# Patient Record
Sex: Male | Born: 1935 | Race: White | Hispanic: No | Marital: Married | Smoking: Never smoker
Health system: Southern US, Community
[De-identification: ages and names within clinical notes are randomized; demographics above are authoritative.]

## PROBLEM LIST (undated history)

## (undated) DIAGNOSIS — I471 Supraventricular tachycardia, unspecified: Secondary | ICD-10-CM

## (undated) DIAGNOSIS — M1990 Unspecified osteoarthritis, unspecified site: Secondary | ICD-10-CM

## (undated) DIAGNOSIS — G4733 Obstructive sleep apnea (adult) (pediatric): Secondary | ICD-10-CM

## (undated) DIAGNOSIS — I872 Venous insufficiency (chronic) (peripheral): Secondary | ICD-10-CM

## (undated) DIAGNOSIS — I2510 Atherosclerotic heart disease of native coronary artery without angina pectoris: Secondary | ICD-10-CM

## (undated) DIAGNOSIS — E119 Type 2 diabetes mellitus without complications: Secondary | ICD-10-CM

## (undated) DIAGNOSIS — R42 Dizziness and giddiness: Secondary | ICD-10-CM

## (undated) DIAGNOSIS — I5032 Chronic diastolic (congestive) heart failure: Secondary | ICD-10-CM

## (undated) DIAGNOSIS — E785 Hyperlipidemia, unspecified: Secondary | ICD-10-CM

## (undated) HISTORY — DX: Obstructive sleep apnea (adult) (pediatric): G47.33

## (undated) HISTORY — PX: FRACTURE SURGERY: SHX138

## (undated) HISTORY — DX: Venous insufficiency (chronic) (peripheral): I87.2

## (undated) HISTORY — PX: HERNIA REPAIR: SHX51

## (undated) HISTORY — PX: TONSILLECTOMY: SUR1361

## (undated) HISTORY — PX: COSMETIC SURGERY: SHX468

## (undated) HISTORY — PX: OTHER SURGICAL HISTORY: SHX169

## (undated) HISTORY — DX: Morbid (severe) obesity due to excess calories: E66.01

## (undated) HISTORY — DX: Supraventricular tachycardia: I47.1

## (undated) HISTORY — DX: Unspecified osteoarthritis, unspecified site: M19.90

## (undated) HISTORY — DX: Atherosclerotic heart disease of native coronary artery without angina pectoris: I25.10

## (undated) HISTORY — DX: Supraventricular tachycardia, unspecified: I47.10

## (undated) HISTORY — DX: Dizziness and giddiness: R42

## (undated) HISTORY — DX: Chronic diastolic (congestive) heart failure: I50.32

## (undated) HISTORY — DX: Type 2 diabetes mellitus without complications: E11.9

## (undated) HISTORY — DX: Hyperlipidemia, unspecified: E78.5

---

## 2004-11-22 ENCOUNTER — Ambulatory Visit: Payer: Self-pay | Admitting: Cardiology

## 2005-01-03 ENCOUNTER — Ambulatory Visit: Payer: Self-pay | Admitting: Cardiology

## 2005-01-03 ENCOUNTER — Encounter: Payer: Self-pay | Admitting: Cardiology

## 2005-02-14 ENCOUNTER — Ambulatory Visit: Payer: Self-pay | Admitting: Cardiology

## 2005-02-21 ENCOUNTER — Inpatient Hospital Stay (HOSPITAL_BASED_OUTPATIENT_CLINIC_OR_DEPARTMENT_OTHER): Admission: RE | Admit: 2005-02-21 | Discharge: 2005-02-21 | Payer: Self-pay | Admitting: Cardiology

## 2005-02-21 ENCOUNTER — Ambulatory Visit: Payer: Self-pay | Admitting: Cardiology

## 2005-03-14 ENCOUNTER — Ambulatory Visit: Payer: Self-pay | Admitting: Cardiology

## 2005-04-02 ENCOUNTER — Ambulatory Visit: Payer: Self-pay | Admitting: Internal Medicine

## 2005-06-20 ENCOUNTER — Ambulatory Visit: Payer: Self-pay | Admitting: Cardiology

## 2005-12-12 ENCOUNTER — Ambulatory Visit: Payer: Self-pay | Admitting: Cardiology

## 2006-03-27 ENCOUNTER — Ambulatory Visit: Payer: Self-pay | Admitting: Cardiology

## 2006-09-08 ENCOUNTER — Ambulatory Visit: Payer: Self-pay | Admitting: Cardiology

## 2007-03-11 ENCOUNTER — Ambulatory Visit: Payer: Self-pay | Admitting: Cardiology

## 2007-07-08 ENCOUNTER — Encounter: Payer: Self-pay | Admitting: Cardiology

## 2007-09-06 ENCOUNTER — Encounter: Payer: Self-pay | Admitting: Cardiology

## 2008-02-14 ENCOUNTER — Ambulatory Visit: Payer: Self-pay | Admitting: Cardiology

## 2008-05-05 ENCOUNTER — Encounter: Payer: Self-pay | Admitting: Cardiology

## 2008-10-04 ENCOUNTER — Ambulatory Visit: Payer: Self-pay | Admitting: Cardiology

## 2008-10-05 ENCOUNTER — Encounter: Payer: Self-pay | Admitting: Cardiology

## 2008-10-23 ENCOUNTER — Encounter: Payer: Self-pay | Admitting: Physician Assistant

## 2008-12-22 ENCOUNTER — Encounter: Payer: Self-pay | Admitting: Cardiology

## 2008-12-26 ENCOUNTER — Telehealth: Payer: Self-pay | Admitting: Cardiology

## 2008-12-26 DIAGNOSIS — E782 Mixed hyperlipidemia: Secondary | ICD-10-CM | POA: Insufficient documentation

## 2008-12-28 ENCOUNTER — Encounter: Payer: Self-pay | Admitting: Cardiology

## 2009-01-02 ENCOUNTER — Encounter (INDEPENDENT_AMBULATORY_CARE_PROVIDER_SITE_OTHER): Payer: Self-pay | Admitting: *Deleted

## 2009-01-19 ENCOUNTER — Encounter: Payer: Self-pay | Admitting: Cardiology

## 2009-02-03 DIAGNOSIS — I471 Supraventricular tachycardia: Secondary | ICD-10-CM

## 2009-02-03 DIAGNOSIS — I2510 Atherosclerotic heart disease of native coronary artery without angina pectoris: Secondary | ICD-10-CM | POA: Insufficient documentation

## 2009-04-06 ENCOUNTER — Encounter: Payer: Self-pay | Admitting: Cardiology

## 2009-05-30 ENCOUNTER — Ambulatory Visit: Payer: Self-pay | Admitting: Cardiology

## 2009-05-30 DIAGNOSIS — I10 Essential (primary) hypertension: Secondary | ICD-10-CM

## 2010-05-28 NOTE — Assessment & Plan Note (Signed)
Summary: 6 MO FU PER DEC REMINDER-SRS   Visit Type:  Follow-up Primary Provider:  Dr. Dwana Melena   History of Present Illness: 77 year old male presents for a followup visit. He denies any significant angina or palpitations. He is following with Dr. Margo Aye at this point.  Recent labs from December 10 revealed a BUN 18, creatinine 1.1, LDL 67, HDL 41, total cholesterol 540, triglycerides 101, ALT 19, AST 27, potassium 4.0, TSH 1.7, hemoglobin A1c 6.3.  Patient remains sedentary with no regular exercise regimen. He is morbidly obese, and we have discussed weight loss measures and diet many times. He has had a lot of difficulty losing any weight.  Home blood pressure checks showed systolics ranging from the 140s to the 170s.  He has NYHA class III dyspnea on exertion which is chronic.  Preventive Screening-Counseling & Management  Alcohol-Tobacco     Smoking Status: never  Current Medications (verified): 1)  Simvastatin 40 Mg Tabs (Simvastatin) .... Take One Tablet By Mouth Daily At Bedtime 2)  Diovan 320 Mg Tabs (Valsartan) .... Take One Tablet By Mouth Daily 3)  Aspir-Low 81 Mg Tbec (Aspirin) .... Take 1 Tablet By Mouth Once A Day 4)  Lasix 40 Mg Tabs (Furosemide) .... Take 1 Tablet By Mouth Once A Day 5)  Meloxicam 7.5 Mg Tabs (Meloxicam) .... Take 1 Tablet By Mouth Once A Day 6)  Doxazosin Mesylate 8 Mg Tabs (Doxazosin Mesylate) .... Take 1&1/2 Tablet By Mouth Once A Day 7)  Klor-Con 10 10 Meq Cr-Tabs (Potassium Chloride) .... Take 1 Tablet By Mouth Two Times A Day 8)  Centrum Silver  Tabs (Multiple Vitamins-Minerals) .... Take 1 Tablet By Mouth Once A Day 9)  Verelan 240 Mg Xr24h-Cap (Verapamil Hcl) .... Take 1 Tablet By Mouth Once A Day 10)  Glipizide 2.5 Mg Xr24h-Tab (Glipizide) .... Take 1 Tablet By Mouth Once A Day 11)  Zyrtec Allergy 10 Mg Caps (Cetirizine Hcl) .... Take 1 Tablet By Mouth Once A Day 12)  Clonidine Hcl 0.2 Mg Tabs (Clonidine Hcl) .... Take 1 Tablet By Mouth Once  A Day 13)  Flonase 50 Mcg/act Susp (Fluticasone Propionate) .... Two Sprays Per Nostril Daily 14)  Coricidin Nighttime Cold-Cough 12.5-325 Mg/41ml Liqd (Diphenhydramine-Apap) .... Take 1-2 Tablet By Mouth Once A Day 15)  Zantac 75 75 Mg Tabs (Ranitidine Hcl) .... Take 1 Tablet By Mouth Once A Day 16)  Xalatan 0.005 % Soln (Latanoprost) .... One Drop To Eye Every Pm 17)  Lidoderm 5 % Ptch (Lidocaine) .... Apply Patch Every 12 Hours  Allergies (verified): 1)  ! Sulfa 2)  ! Pcn 3)  ! Feldene  Comments:  Nurse/Medical Assistant: The patient's medications and allergies were reviewed with the patient and were updated in the Medication and Allergy Lists. List reveiwed.  Past History:  Past Medical History: Last updated: 05/29/2009 PSVT - AVNRT CAD - nonobstructive, LVEF 65% Hyperlipidemia Morbid obesity Arthritis Diabetes Type 2 Vertigo Venous insufficiency OSA - refuses CPAP  Social History: Last updated: 05/29/2009 Retired  Married  Tobacco Use - No Alcohol Use - no  Review of Systems       The patient complains of dyspnea on exertion and peripheral edema.  The patient denies anorexia, fever, weight loss, chest pain, syncope, prolonged cough, headaches, hemoptysis, melena, and hematochezia.         Otherwise reviewed and negative.  Vital Signs:  Patient profile:   77 year old male Height:      72 inches Weight:  384 pounds BMI:     52.27 O2 Sat:      94 % Pulse rate:   79 / minute BP sitting:   143 / 72  (left arm) Cuff size:   large  Vitals Entered By: Carlye Grippe (May 30, 2009 2:18 PM)  Nutrition Counseling: Patient's BMI is greater than 25 and therefore counseled on weight management options.   Physical Exam  Additional Exam:  Morbidly obese male in no acute distress. HEENT: Chronic left eye deviation, oropharynx with poor dentition. Neck: Supple, without carotid bruits or elevated jugular venous pressure. Lungs: Clear, diminished overall,  nonlabored. Cardiac: Regular rate and rhythm, no S3. Abdomen: Morbidly obese, bowel sounds present, nontender. Extremities: Chronic edema and venous stasis, compression hose in place. Musculoskeletal: No kyphosis. Skin: Warm and dry. Neuropsychiatric: Alert and oriented x3, affect appropriate.   Impression & Recommendations:  Problem # 1:  CAD, NATIVE VESSEL (ICD-414.01)  Stable without angina in the setting of previously documented nonobstructive disease. I once again discussed diet, weight loss, and a basic walking regimen with Mr. Mcclane. His medications were reviewed. Six month follow up will be arranged.  The following medications were removed from the medication list:    Lisinopril 20 Mg Tabs (Lisinopril) .Marland Kitchen... Take 1 tablet by mouth two times a day His updated medication list for this problem includes:    Aspir-low 81 Mg Tbec (Aspirin) .Marland Kitchen... Take 1 tablet by mouth once a day    Verelan 240 Mg Xr24h-cap (Verapamil hcl) .Marland Kitchen... Take 1 tablet by mouth once a day  Problem # 2:  SVT/ PSVT/ PAT (ICD-427.0)  No progressive palpitations. He continues on calcium channel blocker therapy.  The following medications were removed from the medication list:    Lisinopril 20 Mg Tabs (Lisinopril) .Marland Kitchen... Take 1 tablet by mouth two times a day His updated medication list for this problem includes:    Aspir-low 81 Mg Tbec (Aspirin) .Marland Kitchen... Take 1 tablet by mouth once a day    Verelan 240 Mg Xr24h-cap (Verapamil hcl) .Marland Kitchen... Take 1 tablet by mouth once a day  Problem # 3:  MORBID OBESITY (ICD-278.01)  This is a functionally limiting problem for Mr. Albright. He has had difficulty achieving any significant weight loss.  Problem # 4:  ESSENTIAL HYPERTENSION, BENIGN (ICD-401.1)  Patient to continue present regimen. Clonidine could be further advanced.  The following medications were removed from the medication list:    Lisinopril 20 Mg Tabs (Lisinopril) .Marland Kitchen... Take 1 tablet by mouth two times a day His  updated medication list for this problem includes:    Diovan 320 Mg Tabs (Valsartan) .Marland Kitchen... Take one tablet by mouth daily    Aspir-low 81 Mg Tbec (Aspirin) .Marland Kitchen... Take 1 tablet by mouth once a day    Lasix 40 Mg Tabs (Furosemide) .Marland Kitchen... Take 1 tablet by mouth once a day    Doxazosin Mesylate 8 Mg Tabs (Doxazosin mesylate) .Marland Kitchen... Take 1&1/2 tablet by mouth once a day    Verelan 240 Mg Xr24h-cap (Verapamil hcl) .Marland Kitchen... Take 1 tablet by mouth once a day    Clonidine Hcl 0.2 Mg Tabs (Clonidine hcl) .Marland Kitchen... Take 1 tablet by mouth once a day  Problem # 5:  OTHER AND UNSPECIFIED HYPERLIPIDEMIA (ICD-272.4)  LDL at goal on simvastatin.  His updated medication list for this problem includes:    Simvastatin 40 Mg Tabs (Simvastatin) .Marland Kitchen... Take one tablet by mouth daily at bedtime  Patient Instructions: 1)  Your physician wants you to  follow-up in: 6 months. You will receive a reminder letter in the mail one-two months in advance. If you don't receive a letter, please call our office to schedule the follow-up appointment. 2)  Your physician recommends that you continue on your current medications as directed. Please refer to the Current Medication list given to you today.

## 2010-06-28 ENCOUNTER — Ambulatory Visit: Payer: Self-pay | Admitting: Cardiology

## 2010-07-26 ENCOUNTER — Ambulatory Visit: Payer: Self-pay | Admitting: Cardiology

## 2010-08-12 ENCOUNTER — Encounter: Payer: Self-pay | Admitting: Cardiology

## 2010-08-13 ENCOUNTER — Encounter: Payer: Self-pay | Admitting: Cardiology

## 2010-08-13 ENCOUNTER — Ambulatory Visit (INDEPENDENT_AMBULATORY_CARE_PROVIDER_SITE_OTHER): Payer: Medicare Other | Admitting: Cardiology

## 2010-08-13 VITALS — BP 112/66 | HR 62 | Ht 72.0 in | Wt 388.8 lb

## 2010-08-13 DIAGNOSIS — I471 Supraventricular tachycardia: Secondary | ICD-10-CM

## 2010-08-13 DIAGNOSIS — I10 Essential (primary) hypertension: Secondary | ICD-10-CM

## 2010-08-13 DIAGNOSIS — I2510 Atherosclerotic heart disease of native coronary artery without angina pectoris: Secondary | ICD-10-CM

## 2010-08-13 NOTE — Assessment & Plan Note (Signed)
Nonobstructive without active angina. Continue medical therapy.

## 2010-08-13 NOTE — Progress Notes (Signed)
Clinical Summary Mr. Attwood is a 77 y.o.male presenting for followup. He was seen in February 2011. He is following regularly with Dr. Margo Aye in [place] on a three month basis.  Reports no significant palpitations or chest pain, and compliance with his medications.  Has chronic NYHA class III dyspnea on exertion. Uses a cane to ambulate, no reported falls or syncope.   Allergies  Allergen Reactions  . Penicillins   . Piroxicam   . Sulfonamide Derivatives     Current outpatient prescriptions:acetaminophen (TYLENOL) 500 MG tablet, Take 2 tabs twice a day , Disp: , Rfl: ;  aspirin 81 MG tablet, Take 2 tabs every a.m., Disp: , Rfl: ;  cetirizine (ZYRTEC) 10 MG tablet, Take 10 mg by mouth every evening. , Disp: , Rfl: ;  Cholecalciferol (D-3-5) 5000 UNITS capsule, Take 5,000 Units by mouth daily.  , Disp: , Rfl:  fluticasone (FLONASE) 50 MCG/ACT nasal spray, 2 sprays by Nasal route daily. As needed., Disp: , Rfl: ;  Insulin Aspart (NOVOLOG FLEXPEN Steen), Inject into the skin. Sliding scale , Disp: , Rfl: ;  insulin glargine (LANTUS) 100 UNIT/ML injection, 70 units twice a day , Disp: , Rfl: ;  latanoprost (XALATAN) 0.005 % ophthalmic solution, 1 drop at bedtime.  , Disp: , Rfl:  lidocaine (LIDODERM) 5 %, Place 1 patch onto the skin daily. Remove & Discard patch within 12 hours or as directed by MD.  As needed., Disp: , Rfl: ;  meloxicam (MOBIC) 7.5 MG tablet, Take 7.5 mg by mouth daily. As needed., Disp: , Rfl: ;  Multiple Vitamins-Minerals (CENTRUM SILVER PO), Take 1 tablet by mouth daily.  , Disp: , Rfl: ;  nebivolol (BYSTOLIC) 10 MG tablet, Take 10 mg by mouth every morning.  , Disp: , Rfl:  potassium chloride (KLOR-CON) 10 MEQ CR tablet, Take 10 mEq by mouth 2 (two) times daily.  , Disp: , Rfl: ;  ranitidine (ZANTAC) 75 MG tablet, Take 75 mg by mouth daily. As needed., Disp: , Rfl: ;  simvastatin (ZOCOR) 40 MG tablet, Take 40 mg by mouth at bedtime.  , Disp: , Rfl: ;  Tamsulosin HCl (FLOMAX) 0.4 MG  CAPS, Take by mouth at bedtime.  , Disp: , Rfl: ;  torsemide (DEMADEX) 100 MG tablet, 70 units twice a day   , Disp: , Rfl:  valsartan (DIOVAN) 320 MG tablet, Take 320 mg by mouth daily.  , Disp: , Rfl: ;  verapamil (VERELAN PM) 240 MG 24 hr capsule, Take 240 mg by mouth at bedtime.  , Disp: , Rfl: ;  DISCONTD: cloNIDine (CATAPRES) 0.2 MG tablet, Take 0.2 mg by mouth daily.  , Disp: , Rfl: ;  DISCONTD: Diphenhydramine-APAP (CORICIDIN NIGHTTIME COLD-COUGH) 12.5-325 MG/15ML LIQD, Take by mouth as needed.  , Disp: , Rfl:  DISCONTD: doxazosin (CARDURA) 8 MG tablet, Take 8 mg by mouth at bedtime. Take 1 and 1/2 tabs daily , Disp: , Rfl: ;  DISCONTD: furosemide (LASIX) 40 MG tablet, Take 40 mg by mouth daily.  , Disp: , Rfl: ;  DISCONTD: glipiZIDE (GLUCOTROL) 2.5 MG 24 hr tablet, Take 2.5 mg by mouth daily.  , Disp: , Rfl:   Past Medical History  Diagnosis Date  . PSVT (paroxysmal supraventricular tachycardia)     AVNRT  . Coronary atherosclerosis of native coronary artery     Nonobstructive, LVEF 65%  . Hyperlipidemia   . Morbid obesity   . Arthritis   . Type 2 diabetes mellitus   .  Vertigo   . Venous insufficiency   . Sleep apnea, obstructive     Social History Mr. Bouwman reports that he has never smoked. He has never used smokeless tobacco. Mr. Mollenkopf reports that he does not drink alcohol.  Review of Systems As noted above, otherwise negative.  Physical Examination Filed Vitals:   08/13/10 1113  BP: 112/66  Pulse: 62   Morbidly obese male in no acute distress. HEENT: Chronic left eye deviation, oropharynx with poor dentition. Neck: Supple, without carotid bruits or elevated jugular venous pressure. Lungs: Clear, diminished overall, nonlabored. Cardiac: Regular rate and rhythm, no S3. Abdomen: Morbidly obese, bowel sounds present, nontender. Extremities: Chronic edema and venous stasis, compression hose in place. Musculoskeletal: No kyphosis. Skin: Warm and dry. Neuropsychiatric:  Alert and oriented x3, affect appropriate.   Problem List and Plan

## 2010-08-13 NOTE — Progress Notes (Signed)
Having sleep apnea test next week per patient.

## 2010-08-13 NOTE — Assessment & Plan Note (Signed)
No reported palpitations, continue medical therapy and observation.

## 2010-08-13 NOTE — Patient Instructions (Signed)
Your physician you to follow up in 1 year. You will receive a reminder letter in the mail one-two months in advance. If you don't receive a letter, please call our office to schedule the follow-up appointment. Your physician recommends that you continue on your current medications as directed. Please refer to the Current Medication list given to you today. 

## 2010-08-13 NOTE — Assessment & Plan Note (Signed)
Blood pressure well-controlled today. 

## 2010-08-21 ENCOUNTER — Other Ambulatory Visit: Payer: Self-pay | Admitting: Cardiology

## 2010-08-22 NOTE — Telephone Encounter (Signed)
[place] pt. 

## 2010-09-10 NOTE — Assessment & Plan Note (Signed)
[facility]                            CARDIOLOGY OFFICE NOTE   NAME:[name], [name]                      MRN:          [id number]  DATE:09/08/2006                            DOB:          [birth year]    PRIMARY CARE PHYSICIAN:  Dr. Weyman Pedro.   REASON FOR VISIT:  Cardiac followup.   HISTORY OF PRESENT ILLNESS:  I saw [name] back in November. He has  had no problems with angina. He is mainly concerned about his obesity  and difficulty with regular exercise given significant arthritic pain in  his back and legs. I talked with him today about weight loss efforts  including portion size and diet. We also talked about activities such as  water exercise or aerobic activity without weightbearing.  Electrocardiogram today showed sinus rhythm with a ventricular couplet.  QT interval is mildly prolonged as noted previously.  He is still  noncompliant with CPAP therapy although his daughter tells me that he  has followup arranged with Dr. Vickey Huger to review management of his  sleep apnea.   ALLERGIES:  PENICILLIN and CONTRAST DYE.   CURRENT MEDICATIONS:  1. Cartia XT 240 mg p.o. daily.  2. Aspirin 81 mg p.o. daily.  3. Diovan 320 mg p.o. daily.  4. Lasix 40 mg p.o. daily.  5. Allegra 180 mg p.o. daily.  6. Rhinocort spray.  7. Glipizide 2.5 mg p.o. daily.  8. Doxazosin 2 mg p.o. daily.   REVIEW OF SYSTEMS:  As described in the history of present illness.   PHYSICAL EXAMINATION:  VITAL SIGNS:  Blood pressure today is 138/76,  heart rate is 81, weight is 344 pounds which is stable.  GENERAL:  This is a morbidly obese male in no acute distress.  NECK:  Reveals no elevated jugular venous pressure or loud bruits.  LUNGS:  Clear without labored breathing.  CARDIAC:  Reveals a regular rate and rhythm. No loud murmur or S3  gallop. Heart sounds somewhat distant.  EXTREMITIES:  Exhibit chronic appearing edema and venous stasis without  pitting  edema.   IMPRESSION/RECOMMENDATIONS:  1. Previously documented nonobstructive coronary atherosclerosis,      presently without angina. Today we talked about diet, exercise and      a goal of weight loss as well as his medical regimen. I will plan      to see him back for symptom review in the next 6 months.  2. Previously documented paroxysmal supraventricular tachycardia,      likely AV nodal reentrant      tachycardia. He is symptomatically stable on Cartia XT at this      time.  3. Morbid obesity and obstructive sleep apnea, noncompliant with CPAP      therapy.     Jonelle Sidle, MD  Electronically Signed    SGM/MedQ  DD: 09/08/2006  DT: 09/08/2006  Job #: [id number]   cc:   Weyman Pedro

## 2010-09-10 NOTE — Assessment & Plan Note (Signed)
[facility]                          [facility] NOTE   NAME:[name], [name]                      MRN:          [id number]  DATE:10/04/2008                            DOB:          Sep 11, 1931    PRIMARY CARE PHYSICIAN:  Dr. Linward Foster   REASON FOR VISIT:  Routine followup.   HISTORY OF PRESENT ILLNESS:  [name] was seen in the office back in  October last year.  He is not reporting any problems with angina.  His  previously documented supraventricular tachycardia has also been  relatively quiescent.  He continues on verapamil for this.  He has not  had any recent followup lipids and is not on statin therapy at this  particular time.   Past history includes nonobstructive coronary atherosclerosis with  normal ejection fraction documented at catheterization in October 2006.  He continues to complain of arthritic pain in his back and knees, which  limits his ambulation.  He remains morbidly obese and he has had a very  difficult time with weight loss.   ALLERGIES:  PENICILLIN and INTRAVENOUS CONTRAST.   PRESENT MEDICATIONS:  1. Aspirin 81 mg 2 tablets p.o. daily.  2. Lasix 40 mg one-half to three-quarters of a tablet daily.  3. Meloxicam 7.5 mg p.o. daily.  4. Doxazosin 8 mg p.o. nightly.  5. Klor-Con 10 mEq p.o. b.i.d.  6. Fexofenadine 180 mg p.o. daily.  7. Centrum Silver daily.  8. Diovan 325 mg p.o. daily.  9. Verapamil ER 240 mg p.o. daily.   REVIEW OF SYSTEMS:  As outlined above.  Otherwise, reviewed and are  negative.   PHYSICAL EXAMINATION:  VITAL SIGNS:  Blood pressure is 139/70, heart  rate is 69, weight is 336 pounds.  GENERAL:  This is a morbidly obese male in no acute distress.  NECK:  No elevated jugular venous pressure.  No loud carotid bruits or  thyromegaly.  Increased neck girth.  LUNGS:  Clear with diminished breath sounds.  CARDIAC:  Regular rate and rhythm.  No S3 gallop.  Indistinct PMI.  No  pericardial rub.  ABDOMEN:  Obese.  Unable to palpate liver edge. Bowel sounds present.  EXTREMITIES:  Chronic stable appearing edema.  Distal pulses are 1+.  SKIN:  Warm and dry.  MUSCULOSKELETAL:  No kyphosis noted.  NEUROPSYCHIATRIC:  The patient is alert and oriented x3.  Affect is  appropriate.   A 12-lead electrocardiogram today shows sinus rhythm with a premature  ventricular complex, otherwise no significant ST-T wave changes.   IMPRESSION AND RECOMMENDATIONS:  1. Nonobstructive coronary atherosclerosis with normal ejection      fraction.  The patient is symptomatically stable without angina.      We will plan to continue medical therapy.  I would like him to      undergo a followup lipid profile to investigate the possibility of      the appropriateness for statin therapy and aggressive risk factor      modification.  Ideally, his LDL should be around 70.  Otherwise, we      will plan a  routine clinic visit in the next 6 months.  2. History of paroxysmal supraventricular tachycardia, likely due to a      reentrant mechanism.  This has been stable on long-acting      verapamil.     Jonelle Sidle, MD  Electronically Signed    SGM/MedQ  DD: 10/04/2008  DT: 10/05/2008  Job #: [id number]   cc:   Linward Foster

## 2010-09-10 NOTE — Assessment & Plan Note (Signed)
[facility]                          [facility]   NAME:COONEDavier, Tramell                      MRN:          [id number]  DATE:02/14/2008                            DOB:          03-30-1932    PRIMARY CARE PHYSICIAN:  Dr. Linward Foster.   REASON FOR VISIT:  Scheduled followup.   HISTORY OF PRESENT ILLNESS:  I saw Mr. Risinger back in November 2008.  He  is not reporting any problems with palpitations or exertional chest  pain.  He continues to struggle with weight loss.  He reports compliance  with his medications and his electrocardiogram today is essentially  normal showing sinus rhythm at 76 beats per minute with normal  intervals.  He has had some recent sinus trouble, but otherwise has  had no major health concerns.   ALLERGIES:  PENICILLIN and INTRAVENOUS CONTRAST.   PRESENT MEDICATIONS:  1. Enteric-coated aspirin 81 mg 2 tablets p.o. daily.  2. Lasix 40 mg 1/2 tablet p.o. daily.  3. Meloxicam 7.5 mg p.o. daily.  4. Doxazosin 8 mg p.o. q.h.s.  5. Klor-Con 10 mEq p.o. b.i.d.  6. Fexofenadine 180 mg p.o. daily.  7. Verapamil 240 mg p.o. daily.  8. Centrum Silver daily.  9. Diovan 320 mg p.o. daily.  10.Naprosyn p.r.n.   REVIEW OF SYSTEMS:  As per history of present illness.  Otherwise,  negative.   PHYSICAL EXAMINATION:  VITAL SIGNS:  Blood pressure is 118/71, heart  rate is 72, and weight is 316 pounds.  GENERAL:  He is a morbidly obese male in no acute distress.  NECK:  No elevated jugular venous pressure.  No audible bruits.  No  thyromegaly is noted.  LUNGS:  Clear without labored breathing at rest.  CARDIAC:  Regular rate and rhythm.  No S3, gallop, or loud murmur.  EXTREMITIES:  Chronic-appearing edema, nonpitting.   IMPRESSION AND RECOMMENDATIONS:  1. History of nonobstructive coronary artery disease with overall      preserved ejection fraction.  He is not reporting any angina.  We      will plan to continue  medical therapy at this point.  2. Paroxysmal supraventricular tachycardia, likely reentrant in      mechanism, stable on medical therapy.     Jonelle Sidle, MD  Electronically Signed    SGM/MedQ  DD: 02/14/2008  DT: 02/15/2008  Job #: [id number]   cc:   Linward Foster

## 2010-09-10 NOTE — Assessment & Plan Note (Signed)
[name]                            CARDIOLOGY OFFICE NOTE   NAME:[name], [name]                      MRN:          [id number]  DATE:03/11/2007                            DOB:          Feb 08, 1932    PRIMARY CARE PHYSICIAN:  Dr. Linward Foster.   HISTORY OF PRESENT ILLNESS:  [name] comes in for a routine visit.  He  states that he has been using Weight [employer] and has lost approximately  25 pounds since I last saw him.  He is not having any trouble with  angina and has been more compliant with his CPAP, reporting better  energy.  An electrocardiogram shows nonspecific ST-T wave changes with a  leftward axis.  No ectopy is noted.   I have reviewed his medications today and also his outpatient blood  pressure records.   ALLERGIES:  1. PENICILLIN.  2. CONTRAST DYE.   PRESENT MEDICATIONS:  1. Aspirin 81 mg p.o. daily.  2. Cartia XT 240 mg p.o. daily.  3. Aleve p.r.n.  4. Diovan 320 mg p.o. daily.  5. Lasix 40 mg p.o. daily.  6. Allegra 180 mg p.o. daily.  7. Doxazosin 8 mg p.o. daily.  8. Rhinocort spray.  9. Glipizide 2.5 mg p.o. daily.   REVIEW OF SYSTEMS:  As described in the history of present illness.   PHYSICAL EXAMINATION:  VITAL SIGNS:  Blood pressure is 120/72, heart  rate is 86, weight is 326 pounds.  GENERAL:  He is a morbidly obese male in no acute distress.  NECK:  Reveals no elevated jugular venous pressure.  LUNGS:  Clear without labored breathing.  CARDIAC:  Reveals a regular rate and rhythm.  No S3 gallop.  ABDOMEN:  Soft, nontender.  No bruits.  EXTREMITIES:  Exhibit venous stasis and chronic appearing edema.   IMPRESSION AND RECOMMENDATIONS:  1. Nonobstructive coronary artery disease.  I have recommended      continued strategy of weight loss with low level exercise.  He is      not having any problems at this time with angina.  2. Previously documented paroxysmal supraventricular tachycardia that      is  likely due to re-      entrant AV nodal tachycardia.  He continues on [place] with no      significant palpitations.  3. Morbid obesity with obstructive sleep apnea and more compliance      with CPAP use.     Jonelle Sidle, MD  Electronically Signed    SGM/MedQ  DD: 03/11/2007  DT: 03/11/2007  Job #: [id number]   cc:   Linward Foster

## 2010-09-11 DIAGNOSIS — I509 Heart failure, unspecified: Secondary | ICD-10-CM

## 2010-09-11 DIAGNOSIS — I498 Other specified cardiac arrhythmias: Secondary | ICD-10-CM

## 2010-09-12 DIAGNOSIS — I5033 Acute on chronic diastolic (congestive) heart failure: Secondary | ICD-10-CM

## 2010-09-13 NOTE — Assessment & Plan Note (Signed)
[facility]                            CARDIOLOGY OFFICE NOTE   NAME:[name], [name]                      MRN:          [id number]  DATE:03/27/2006                            DOB:          26-Feb-1932    PRIMARY CARE PHYSICIAN:  Weyman Pedro, M.D.   REASON FOR VISIT:  Routine followup.   HISTORY OF PRESENT ILLNESS:  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 He also suffers with morbid obesity, which has been worse  recently. Symptomatically, he describes fatigue and dyspnea on exertion,  as well as daytime somnolence. He has already been diagnosed with  obstructive sleep apnea and tells me quite frankly that he does not want  to use his CPAP machine. Dr. Eliberto Ivory has been following the patient's  blood pressure, which has been elevated recently and there have been  medication adjustments made. He has also felt a relative increase in  heart rate, although very rapid palpitations, which he has felt in the  past with his paroxysmal supraventricular tachycardia. He has had a very  focal left sided chest pain below his left breast, also in the setting  of increased reflux, which is fairly atypical for ischemia. His  electrocardiogram today shows sinus rhythm at 82 beats per minute with  occasional premature ventricular complexes and non-specific ST changes.  Today, we had a fairly frank discussion about his weight and the fact  that this more than likely, complicates most of his co-morbid illness  and is contributing to his symptoms quite a bit. I also encouraged him  to re-consider using CPAP therapy.   ALLERGIES:  PENICILLIN, CONTRAST EYE (possibly.)   CURRENT MEDICATIONS:  1. Cartia XT 240 mg p.o. daily.  2. Aspirin 81 mg p.o.  daily.  3. Diovan 320 mg p.o. daily.  4. Lasix 40 mg p.o. daily.  5. Allegra 180 mg p.o. daily.  6. Metformin 500 mg p.o. daily.  7. Doxazosin 2 mg p.o. daily.  8. Rhinocort spray daily.   REVIEW OF SYSTEMS:  As described in the history of present illness.   PHYSICAL EXAMINATION:  VITAL SIGNS:  Blood pressure 124/74, heart rate  83. Weight 343 pounds, up from 328 back in December of 2006.  GENERAL:  This is a morbidly obese male without active chest pain.  NECK:  Examination reveals no obvious elevated jugular venous pressure.  LUNGS:  Clear without labored breathing.  CARDIAC:  Regular rate and rhythm. Somewhat distant heart sounds. No  pericardial rub or S3 gallop.  ABDOMEN:  Morbidly obese. Unable to accurately palpate for any specific  organomegaly.  EXTREMITIES:  Show chronic appearing edema and venous stasis.   IMPRESSION/RECOMMENDATIONS:  1. Dyspnea, daytime somnulence, fatigue, and elevated blood pressure.      I suspect all of these issues are in many ways related. [name]      is aware of his morbid obesity and  we discussed this today. I also      encouraged him to reconsider using CPAP, as this may have an impact      on his symptoms as well. I agree with the medication changes made      by Dr. Eliberto Ivory and have encouraged Mr. Prokop to continue regular      followup with him for additional blood pressure management. He is      on fairly high doses of the present medications and may need an      additional medication ultimately for more optimal control. Could      consider advancing Cartia XT as the next step. I ultimately think      that in the absence of a reasonable weight loss, [name]'s risk      for future adverse events will remain high, despite medical      therapy.  2. Atypical, focal, left sided chest pain, related by description with      increased reflux. It is unlikely to be ischemic in origin, although      the patient does have known underlying  non-obstructive coronary      artery disease. At this point, would recommend medical therapy. We      did not pursue stress testing at this particular juncture. I will      plan to see him back over the next 6 months.     Jonelle Sidle, MD  Electronically Signed    SGM/MedQ  DD: 03/27/2006  DT: 03/28/2006  Job #: [id number]   cc:   Weyman Pedro

## 2010-09-13 NOTE — Assessment & Plan Note (Signed)
[facility]                            [facility] OFF[facility] NOTE   NAME:COONEJolan, Upchurch                      MRN:          [id number]  DATE:12/12/2005                            DOB:          [birth year]    Mr. Fichera returns today for a routine cardiology followup.  He denies any  episodes of chest discomfort.  He states he has been under a tremendous  amount of stress with the recent acute illness of his wife, which has  involved a tremendous amount of his time and energy.  He also complains of  some chronic vertigo, which is ongoing for him.  He was actually recently  seen at [facility] and admitted by Dr. Sherril Croon for dizziness, however,  dizziness at that time was attributed to the patient's recent crash diet  in which he apparently lost a large amount of weight within a short period  of time with the use of a colon cleansing type agents.  The patient was  dehydrated secondary to over vigorous dieting per Dr. Magnus Ivan discharging  note.  The patient was treated with fluids and returned home with no further  problems.  Mr. Hurrell also denies any episodes of palpitations.  He was  previously evaluated by EP for PSVT, which apparently was felt to be AV  nodal re-entry tachycardia.  Apparently, he was started on verapamil last  fall by Dr. Graciela Husbands.  Overall, the patient states he is feeling good.  His  weight is up.  He states he has been trying to loose some weight again but  in a more of a safe manner than previously attempted.  He is complaining of  some peripheral swelling.  He states his chlorthalidone was decreased to  12.5 mg while in the hospital with the dehydration.   PAST MEDICAL HISTORY:  1. Type 2 diabetes, followed diet and exercise.  2. Hypertension.  3. Nonobstructive coronary artery disease with an 80% distal second obtuse      marginal lesion being treated medically.  Normal ejection fraction of      65%.  4. Paroxysmal  supraventricular tachycardia (atrioventricular nodal re-      entry tachycardia).  5. Chronic vertigo.  6. Morbid obesity.  7. Degenerative joint disease.   REVIEW OF SYSTEMS:  As stated above in history of present illness, otherwise  negative.   ADMISSION LABORATORY:  1. PENICILLIN.  2. IVP DYE.   CURRENT MEDICATIONS:  1. Aspirin 81 mg daily.  2. Diovan 80 mg daily.  3. Chlorthalidone 12.5 mg daily.  4. Centrum Silver daily.  5. Verapamil 240 mg daily.  6. Allegra 160 mg daily.  7. Zantac 150 mg daily.  8. Rhinocort nasal spray daily.   PHYSICAL EXAMINATION:  VITAL SIGNS:  Weight 316 pounds, blood pressure  138/70, heart rate 82.  GENERAL:  Mr. Lippy is a morbidly obese Caucasian gentleman in no acute  distress.  NECK:  His neck veins are flat.  LUNGS:  Clear to auscultation.  CARDIOVASCULAR:  Reveals a regular rate and rhythm.  ABDOMEN:  Soft, nontender.  Positive bowel  sounds.  Obese.  EXTREMITIES:  With chronic venous insufficiency with 1+ pitting edema  bilaterally.   IMPRESSION:  1. Nonobstructive coronary artery disease, stable without any episodes of      angina.  2. Morbid obesity.  3. Chronic venous insufficiency.  However, the patient's chlorthalidone      was decreased to 12.5 mg during recent hospitalization.  I am going to      have the patient increase that back to 25 mg daily.  4. Paroxysmal supraventricular tachycardia, currently controlled with      verapamil 240 mg daily.   Overall, the patient is stable from a cardiac perspective.  We will have him  continue current medications.  If he experiences any episodes of angina or  palpitations, he is to call our office and seek medical assistance.  Otherwise, we will see him back in 6 months.                                   Dorian Pod, ACNP                                Jonelle Sidle, MD   MB/MedQ  DD:  12/12/2005  DT:  12/12/2005  Job #:  [id number]

## 2010-09-13 NOTE — Cardiovascular Report (Signed)
[name], [name] NO.:  [id number]   MEDICAL RECORD NO.:  [id number]          PATIENT TYPE:  OIB   LOCATION:  1963                         FACILITY:  [facility]   PHYSICIAN:  Jonelle Sidle, M.D. LHCDATE OF BIRTH:  1932-04-26   DATE OF PROCEDURE:  02/21/2005  DATE OF DISCHARGE:  02/21/2005                              CARDIAC CATHETERIZATION   PRIMARY CARE PHYSICIAN:  Dr. Nena Jordan of  [facility].   INDICATIONS:  [name] is a 77 year old male followed in our clinic with a  history of type 2 diabetes mellitus, hypertension and previously noted low-  risk abnormal Cardiolite suggesting a mild degree of ischemia in inferior  wall with normal ejection fraction. He was recently diagnosed with  paroxysmal supraventricular tachycardia that looks to be due to potentially  a reentrant mechanism. He has been placed on calcium channel blocker therapy  and has had no significant breakthrough episodes of spells during which  time he feels breathless, associated with unusual feeling in his head,  visual changes and a pressure in the upper chest and back. These episodes  have been documented along with the patient's arrhythmia. He is referred now  for diagnostic coronary angiography to clearly assess the coronary anatomy  and evaluate for potential revascularizations strategies. The risks and  benefits were clearly explained to the patient and he has signed informed  consent and is in agreement to proceed.   PROCEDURE PERFORMED:  1.  Left heart catheterization.  2.  Selective coronary angiography.  3.  Left ventriculography.   ACCESS AND EQUIPMENT:  The area about the right femoral artery was  anesthetized with 1% lidocaine and a 4-French sheath was placed in the right  femoral artery via the modified Seldinger technique. Standard preformed 4-  [place] JL-4, JR-4 catheters were used for selective coronary angiography and  an angled pigtail catheter was  used for left heart catheterization and left  ventriculography. All exchanges were made over wire. The patient tolerated  procedure well without any complications.   HEMODYNAMIC RESULTS:  Aorta 143/80 mmHg. Left ventricle 141/21 mmHg.   ANGIOGRAPHIC FINDINGS:  1.  The left main coronary artery is a large vessel with evidence of mild      calcification but no obstructive stenoses. This vessel gives rise to the      left anterior descending, a small ramus intermedius and the circumflex      coronary arteries.  2.  The left anterior descending is a medium-caliber vessel with two      diagonal branches; the first of which is largest. There are minor      luminal irregularities within the left anterior descending, no greater      than 20%. There is approximately 20-30% stenosis in the proximal portion      of the large first diagonal branch. No flow-limiting stenoses are noted.  3.  There is a small ramus intermedius.  4.  The circumflex coronary artery is a large vessel. There are two obtuse      marginal branches. The second of which is very large and  bifurcates.      Within the circumflex proper, there are only minor luminal      irregularities with a proximal stenosis of 30%. Within the large second      obtuse marginal, there is a subbranch stenosis of approximately 80%.  5.  The right coronary artery is a medium to large-caliber vessel with large      posterior descending branch. Minor luminal irregularities approximately      20% are noted throughout vessel with a 30% stenosis in the distal      segment. No flow-limiting stenoses are noted.   Left ventriculography was performed in the RAO projection. It reveals an  ejection fraction of approximately 65% with no focal anterior abnormality.  Mild mitral calcification is noted. There is only trace mitral regurgitation  noted.   DIAGNOSES:  1.  No obstructive coronary artery disease in the major epicardial vessels.      There is a  second obtuse marginal of 80%.  2.  Left ventricular ejection fraction approximately 65% with trace mitral      regurgitation, mild mitral and calcification and a left ventricular end-      diastolic pressure of 21 mmHg.   DISCUSSION:  I reviewed the results with the patient and available family.  At this particular time would suggest while the subquality obtuse marginal  subbranch stenosis could potentially be approached percutaneously in terms  of symptomatology at present, nor would it clearly benefit him from a  cardiac mortality perspective. We will plan to proceed on with evaluation of  the patient's arrhythmia as electrophysiology consultation has been  requested that.           ______________________________  Jonelle Sidle, M.D. [facility]     SGM/MEDQ  D:  02/21/2005  T:  02/21/2005  Job:  [id number]   cc:   Nena Jordan  [address]  [place]  [place] [address]

## 2010-09-13 NOTE — Assessment & Plan Note (Signed)
[facility]                            [facility] OFFICE NOTE   NAME:COONEChilton, Sallade                      MRN:          [id number]  DATE:12/12/2005                            DOB:          May 12, 1931    INCOMPLETE:   SUBJECTIVE:  [name] is here today for a routine cardiac followup.  Mr.  [name] states he was recently admitted to [facility] for  dizziness.  He apparently had undergone a crash diet of some type and lost a  large amount of weight within a short period of time.  He began to  experience increased dizziness.  He was admitted by Dr. Doreen Beam with mild  dehydration and azotemia with vertigo.                                   Dorian Pod, ACNP   MB/MedQ  DD:  12/12/2005  DT:  12/12/2005  Job #:  [id number]

## 2010-09-17 DIAGNOSIS — I5031 Acute diastolic (congestive) heart failure: Secondary | ICD-10-CM

## 2010-09-27 ENCOUNTER — Telehealth: Payer: Self-pay | Admitting: *Deleted

## 2010-09-27 NOTE — Telephone Encounter (Signed)
Dyanne Iha, PT with [facility], called stating she was seeing pt for PT eval. She states pt was recently d/c'd from nursing home following CHF exacerbation. She states pt's medications are "messed up," and his weight is up 3 lbs since d/c. She would like to know if pt can having nursing eval to help get medications straight.  Discussed with Dr. Diona Browner who is agreeable to nursing eval.

## 2010-10-08 ENCOUNTER — Ambulatory Visit (INDEPENDENT_AMBULATORY_CARE_PROVIDER_SITE_OTHER): Payer: Medicare Other | Admitting: Cardiology

## 2010-10-08 ENCOUNTER — Encounter: Payer: Self-pay | Admitting: Cardiology

## 2010-10-08 ENCOUNTER — Other Ambulatory Visit: Payer: Self-pay | Admitting: *Deleted

## 2010-10-08 DIAGNOSIS — I2510 Atherosclerotic heart disease of native coronary artery without angina pectoris: Secondary | ICD-10-CM

## 2010-10-08 DIAGNOSIS — I10 Essential (primary) hypertension: Secondary | ICD-10-CM

## 2010-10-08 DIAGNOSIS — I509 Heart failure, unspecified: Secondary | ICD-10-CM

## 2010-10-08 DIAGNOSIS — I5033 Acute on chronic diastolic (congestive) heart failure: Secondary | ICD-10-CM | POA: Insufficient documentation

## 2010-10-08 DIAGNOSIS — I5032 Chronic diastolic (congestive) heart failure: Secondary | ICD-10-CM

## 2010-10-08 DIAGNOSIS — Z79899 Other long term (current) drug therapy: Secondary | ICD-10-CM

## 2010-10-08 DIAGNOSIS — I471 Supraventricular tachycardia: Secondary | ICD-10-CM

## 2010-10-08 MED ORDER — LABETALOL HCL 200 MG PO TABS: 200.0000 mg | ORAL_TABLET | Freq: Two times a day (BID) | ORAL | Status: DC

## 2010-10-08 NOTE — Progress Notes (Signed)
Clinical Summary [name] is a 77 y.o.male presenting for post hospital followup. He was seen in consultation by our service in May, having presented with significant bradycardia in the setting of renal insufficiency on verapamil, and associated with volume overload with acute on chronic diastolic heart failure. There was no clear evidence of an acute coronary syndrome. Followup LVEF was stable at 60-65%. He had a significant diuresis of at least 25 pounds, and a brief stay in the [facility].  He is here with his daughter today. They had several questions about his medications. His list is substantially different from prior, and we discussed the changes today. His weight has been trending down based on outpatient assessment, and blood pressure is reasonably well controlled, although he has only been taking labetalol once a day.  Followup lab work from June 11 shows BUN 22, creatinine 1.4, AST 42, ALT 19, cholesterol 101, triglycerides 122, HDL 45, LDL 42, sodium 142, potassium 4.1.  He reports no chest pain. Seems to be tolerating home physical therapy. No reported palpitations. He states that he has been paying closer attention to his fluid intake and also dietary restrictions.  Allergies  Allergen Reactions  . Penicillins   . Piroxicam   . Sulfonamide Derivatives     Current outpatient prescriptions:acetaminophen (TYLENOL) 500 MG tablet, Take 2 tabs twice a day , Disp: , Rfl: ;  aspirin 81 MG tablet, Take 2 tabs every a.m., Disp: , Rfl: ;  cetirizine (ZYRTEC) 10 MG tablet, Take 10 mg by mouth every evening. , Disp: , Rfl: ;  Cholecalciferol (D-3-5) 5000 UNITS capsule, Take 5,000 Units by mouth daily.  , Disp: , Rfl:  fluticasone (FLONASE) 50 MCG/ACT nasal spray, Place 2 sprays into the nose daily. , Disp: , Rfl: ;  Insulin Aspart (NOVOLOG FLEXPEN [place]), Inject into the skin. Sliding scale , Disp: , Rfl: ;  insulin glargine (LANTUS) 100 UNIT/ML injection, 70 units twice a day , Disp: ,  Rfl: ;  labetalol (NORMODYNE) 300 MG tablet, Take 300 mg by mouth daily.  , Disp: , Rfl:  latanoprost (XALATAN) 0.005 % ophthalmic solution, Place 1 drop into the right eye at bedtime. , Disp: , Rfl: ;  lidocaine (LIDODERM) 5 %, Place 1 patch onto the skin daily. Remove & Discard patch within 12 hours or as directed by MD.  As needed., Disp: , Rfl: ;  Multiple Vitamins-Minerals (CENTRUM SILVER PO), Take 1 tablet by mouth daily.  , Disp: , Rfl:  potassium chloride (KLOR-CON) 10 MEQ CR tablet, Take 10 mEq by mouth 2 (two) times daily.  , Disp: , Rfl: ;  simvastatin (ZOCOR) 40 MG tablet, Take 40 mg by mouth at bedtime.  , Disp: , Rfl: ;  Tamsulosin HCl (FLOMAX) 0.4 MG CAPS, Take by mouth at bedtime.  , Disp: , Rfl: ;  torsemide (DEMADEX) 100 MG tablet, Take 100 mg by mouth daily. 70 units twice a day  , Disp: , Rfl:  DISCONTD: DIOVAN 320 MG tablet, TAKE (1) TABLET BY MOUTH DAILY., Disp: 30 each, Rfl: 6;  DISCONTD: meloxicam (MOBIC) 7.5 MG tablet, Take 7.5 mg by mouth daily. As needed., Disp: , Rfl: ;  DISCONTD: nebivolol (BYSTOLIC) 10 MG tablet, Take 10 mg by mouth every morning.  , Disp: , Rfl: ;  DISCONTD: ranitidine (ZANTAC) 75 MG tablet, Take 75 mg by mouth daily. As needed., Disp: , Rfl:  DISCONTD: verapamil (VERELAN PM) 240 MG 24 hr capsule, Take 240 mg by mouth at bedtime.  ,  Disp: , Rfl:   Past Medical History  Diagnosis Date  . PSVT (paroxysmal supraventricular tachycardia)     AVNRT  . Coronary atherosclerosis of native coronary artery     Nonobstructive, LVEF 65%  . Hyperlipidemia   . Morbid obesity   . Arthritis   . Type 2 diabetes mellitus   . Vertigo   . Venous insufficiency   . Sleep apnea, obstructive   . Chronic diastolic heart failure     Social History [name] reports that he has never smoked. He has never used smokeless tobacco. [name] reports that he does not drink alcohol.  Review of Systems Otherwise reviewed and negative.  Physical Examination Filed Vitals:    10/08/10 0949  BP: 149/79  Pulse: 63  Morbidly obese male in no acute distress.  HEENT: Chronic left eye deviation, oropharynx with poor dentition.  Neck: Supple, without carotid bruits or elevated jugular venous pressure.  Lungs: Clear, diminished overall, nonlabored.  Cardiac: Regular rate and rhythm, no S3.  Abdomen: Morbidly obese, bowel sounds present, nontender.  Extremities: Chronic edema and venous stasis generally improved.  Musculoskeletal: No kyphosis.  Skin: Warm and dry.  Neuropsychiatric: Alert and oriented x3, affect appropriate.   Studies Echocardiogram 09/11/2010: Mildly dilated left ventricle with mild to moderate LVH, LVEF 60-65%, grade 2 diastolic dysfunction, mild left atrial enlargement, mild mitral regurgitation, mildly dilated aortic root.  Problem List and Plan

## 2010-10-08 NOTE — Assessment & Plan Note (Signed)
Continue to work on weight loss through diet and exercise.

## 2010-10-08 NOTE — Assessment & Plan Note (Signed)
No reported angina.  

## 2010-10-08 NOTE — Assessment & Plan Note (Signed)
Symptomatically stable at this point. Fluid restriction has been discussed. Creatinine and potassium are relatively stable on present diuretic regimen and potassium supplementation. Continue with home physical therapy, keeping record of weights and blood pressure. We will maintain close office followup, and obtain a BMET prior to the next visit.

## 2010-10-08 NOTE — Assessment & Plan Note (Signed)
No reported palpitations. No longer on verapamil, but tolerating labetalol.

## 2010-10-08 NOTE — Assessment & Plan Note (Signed)
Labetalol will be changed to 200 mg p.o. B.i.d. for more optimal dosing. Continue to follow blood pressure at home.

## 2010-10-08 NOTE — Patient Instructions (Signed)
Follow up as scheduled. Your physician recommends that you go to the [facility] for lab work: DO BMET BEFORE APPT IN July. Change labetalol to 200 mg two times a day.

## 2010-10-10 ENCOUNTER — Encounter: Payer: Self-pay | Admitting: Cardiology

## 2010-11-20 ENCOUNTER — Ambulatory Visit: Payer: Medicare Other | Admitting: Cardiology

## 2010-11-20 ENCOUNTER — Encounter: Payer: Self-pay | Admitting: Cardiology

## 2010-12-18 ENCOUNTER — Encounter: Payer: Self-pay | Admitting: *Deleted

## 2010-12-27 ENCOUNTER — Encounter: Payer: Self-pay | Admitting: Cardiology

## 2010-12-27 ENCOUNTER — Ambulatory Visit (INDEPENDENT_AMBULATORY_CARE_PROVIDER_SITE_OTHER): Payer: Medicare Other | Admitting: Cardiology

## 2010-12-27 VITALS — BP 138/75 | HR 65 | Ht 72.0 in | Wt 349.0 lb

## 2010-12-27 DIAGNOSIS — I2510 Atherosclerotic heart disease of native coronary artery without angina pectoris: Secondary | ICD-10-CM

## 2010-12-27 DIAGNOSIS — I471 Supraventricular tachycardia: Secondary | ICD-10-CM

## 2010-12-27 DIAGNOSIS — I10 Essential (primary) hypertension: Secondary | ICD-10-CM

## 2010-12-27 NOTE — Assessment & Plan Note (Signed)
Quiescent at this point. 

## 2010-12-27 NOTE — Assessment & Plan Note (Signed)
Blood pressure is reasonable, continue present regimen.

## 2010-12-27 NOTE — Assessment & Plan Note (Signed)
Clinically stable, no active angina on medical therapy.

## 2010-12-27 NOTE — Patient Instructions (Signed)
   Your physician wants you to follow-up in: 3 months. You will receive a reminder letter in the mail one-two months in advance. If you don't receive a letter, please call our office to schedule the follow-up appointment.  Your physician recommends that you continue on your current medications as directed. Please refer to the Current Medication list given to you today.  

## 2010-12-27 NOTE — Assessment & Plan Note (Signed)
Weight loss indicated. Discussed diet. He has had difficulty losing any significant weight in the recent past.

## 2010-12-27 NOTE — Progress Notes (Signed)
Clinical Summary [name] is a 77 y.o.male presenting for followup. He was seen back in June.  Followup lab work in August showed BUN 22, creatinine 1.4, sodium 139, potassium 3.7. We reviewed this today.  He is here with his wife. Generally seems to be doing fairly well. States he had "the flu" last week and then was in bed or 2 days. Normal appetite now. No fevers or chills.  Reports no palpitations or chest pain. Indicates compliance with his medications. Still struggles to lose any weight.  Allergies  Allergen Reactions  . Penicillins   . Piroxicam   . Sulfonamide Derivatives     Medication list reviewed.  Past Medical History  Diagnosis Date  . PSVT (paroxysmal supraventricular tachycardia)     AVNRT  . Coronary atherosclerosis of native coronary artery     Nonobstructive, LVEF 65%  . Hyperlipidemia   . Morbid obesity   . Arthritis   . Type 2 diabetes mellitus   . Vertigo   . Venous insufficiency   . Sleep apnea, obstructive   . Chronic diastolic heart failure     Past Surgical History  Procedure Date  . Left arm surgery     Family History  Problem Relation Age of Onset  . Coronary artery disease Son     Social History [name] reports that he has never smoked. He has never used smokeless tobacco. [name] reports that he does not drink alcohol.  Review of Systems Otherwise reviewed and negative except as outlined.  Physical Examination Filed Vitals:   12/27/10 1114  BP: 138/75  Pulse: 65   Morbidly obese male in no acute distress.  HEENT: Chronic left eye deviation, oropharynx with poor dentition.  Neck: Supple, without carotid bruits or elevated jugular venous pressure.  Lungs: Clear, diminished overall, nonlabored.  Cardiac: Regular rate and rhythm, no S3.  Abdomen: Morbidly obese, bowel sounds present, nontender.  Extremities: Chronic edema and venous stasis generally improved.  Musculoskeletal: No kyphosis.  Skin: Warm and dry.    Neuropsychiatric: Alert and oriented x3, affect appropriate.   ECG Reviewed in EMR.    Problem List and Plan

## 2011-02-05 ENCOUNTER — Ambulatory Visit: Payer: Medicare Other | Admitting: Cardiology

## 2011-03-31 ENCOUNTER — Encounter: Payer: Self-pay | Admitting: Cardiology

## 2011-04-01 ENCOUNTER — Ambulatory Visit (INDEPENDENT_AMBULATORY_CARE_PROVIDER_SITE_OTHER): Payer: Medicare Other | Admitting: Cardiology

## 2011-04-01 ENCOUNTER — Encounter: Payer: Self-pay | Admitting: Cardiology

## 2011-04-01 VITALS — BP 110/64 | HR 75 | Resp 22 | Ht 72.0 in | Wt 372.0 lb

## 2011-04-01 DIAGNOSIS — I5032 Chronic diastolic (congestive) heart failure: Secondary | ICD-10-CM

## 2011-04-01 DIAGNOSIS — I10 Essential (primary) hypertension: Secondary | ICD-10-CM

## 2011-04-01 DIAGNOSIS — I471 Supraventricular tachycardia: Secondary | ICD-10-CM

## 2011-04-01 DIAGNOSIS — I2510 Atherosclerotic heart disease of native coronary artery without angina pectoris: Secondary | ICD-10-CM

## 2011-04-01 DIAGNOSIS — I509 Heart failure, unspecified: Secondary | ICD-10-CM

## 2011-04-01 NOTE — Assessment & Plan Note (Signed)
Volume status seems appropriate, good blood pressure and heart rate control on current regimen. No changes made.

## 2011-04-01 NOTE — Assessment & Plan Note (Signed)
History of nonobstructive disease, no active angina. Continue medical therapy and observation.

## 2011-04-01 NOTE — Assessment & Plan Note (Signed)
Blood pressure well controlled

## 2011-04-01 NOTE — Patient Instructions (Signed)
Continue all current medications. Your physician wants you to follow up in: 6 months.  You will receive a reminder letter in the mail one-two months in advance.  If you don't receive a letter, please call our office to schedule the follow up appointment   

## 2011-04-01 NOTE — Assessment & Plan Note (Signed)
No reported palpitations.

## 2011-04-01 NOTE — Assessment & Plan Note (Signed)
Continue to encourage healthy diet, exercise, weight loss.

## 2011-04-01 NOTE — Progress Notes (Signed)
Clinical Summary [name] is a 77 y.o.male presenting for followup. He was seen in August. He is here with his wife today. Does not indicate any significant palpitations or chest pain. Still struggles to lose any weight, remains morbidly obese which almost certainly limits his functional capacity. We continue to discuss this in an encouraging way.  Volume-wise, he seems to be relatively stable. No orthopnea. His blood pressure is quite well controlled today. Continues to follow with Dr. Margo Aye for primary care.   Allergies  Allergen Reactions  . Penicillins   . Piroxicam   . Sulfonamide Derivatives     Medication list reviewed.  Past Medical History  Diagnosis Date  . PSVT (paroxysmal supraventricular tachycardia)     AVNRT  . Coronary atherosclerosis of native coronary artery     Nonobstructive, LVEF 65%  . Hyperlipidemia   . Morbid obesity   . Arthritis   . Type 2 diabetes mellitus   . Vertigo   . Venous insufficiency   . Sleep apnea, obstructive   . Chronic diastolic heart failure     Past Surgical History  Procedure Date  . Left arm surgery     Family History  Problem Relation Age of Onset  . Coronary artery disease Son     Social History [name] reports that he has never smoked. He has never used smokeless tobacco. [name] reports that he does not drink alcohol.  Review of Systems No reported falls or syncope. No spontaneous bleeding problems. Otherwise negative except as outlined.  Physical Examination Filed Vitals:   04/01/11 1013  BP: 110/64  Pulse: 75  Resp: 22    Morbidly obese male in no acute distress.  HEENT: Chronic left eye deviation, oropharynx with poor dentition.  Neck: Supple, without carotid bruits or elevated jugular venous pressure.  Lungs: Clear, diminished overall, nonlabored.  Cardiac: Regular rate and rhythm, no S3.  Abdomen: Morbidly obese, bowel sounds present, nontender.  Extremities: Chronic edema and venous stasis  generally improved.    Problem List and Plan

## 2011-04-27 ENCOUNTER — Other Ambulatory Visit: Payer: Self-pay | Admitting: Cardiology

## 2011-04-28 NOTE — Telephone Encounter (Signed)
**Note De-identified  Obfuscation** [place] pt. 

## 2011-07-16 DIAGNOSIS — E785 Hyperlipidemia, unspecified: Secondary | ICD-10-CM | POA: Diagnosis not present

## 2011-07-16 DIAGNOSIS — E119 Type 2 diabetes mellitus without complications: Secondary | ICD-10-CM | POA: Diagnosis not present

## 2011-07-16 DIAGNOSIS — Z125 Encounter for screening for malignant neoplasm of prostate: Secondary | ICD-10-CM | POA: Diagnosis not present

## 2011-07-16 DIAGNOSIS — I10 Essential (primary) hypertension: Secondary | ICD-10-CM | POA: Diagnosis not present

## 2011-08-04 DIAGNOSIS — E1149 Type 2 diabetes mellitus with other diabetic neurological complication: Secondary | ICD-10-CM | POA: Diagnosis not present

## 2011-08-04 DIAGNOSIS — E119 Type 2 diabetes mellitus without complications: Secondary | ICD-10-CM | POA: Diagnosis not present

## 2011-09-20 DIAGNOSIS — E119 Type 2 diabetes mellitus without complications: Secondary | ICD-10-CM | POA: Diagnosis not present

## 2011-09-20 DIAGNOSIS — Z794 Long term (current) use of insulin: Secondary | ICD-10-CM | POA: Diagnosis not present

## 2011-09-20 DIAGNOSIS — I509 Heart failure, unspecified: Secondary | ICD-10-CM | POA: Diagnosis not present

## 2011-09-20 DIAGNOSIS — T07XXXA Unspecified multiple injuries, initial encounter: Secondary | ICD-10-CM | POA: Diagnosis not present

## 2011-09-20 DIAGNOSIS — Z79899 Other long term (current) drug therapy: Secondary | ICD-10-CM | POA: Diagnosis not present

## 2011-09-20 DIAGNOSIS — M7730 Calcaneal spur, unspecified foot: Secondary | ICD-10-CM | POA: Diagnosis not present

## 2011-09-20 DIAGNOSIS — S99919A Unspecified injury of unspecified ankle, initial encounter: Secondary | ICD-10-CM | POA: Diagnosis not present

## 2011-09-20 DIAGNOSIS — J449 Chronic obstructive pulmonary disease, unspecified: Secondary | ICD-10-CM | POA: Diagnosis not present

## 2011-09-20 DIAGNOSIS — S8990XA Unspecified injury of unspecified lower leg, initial encounter: Secondary | ICD-10-CM | POA: Diagnosis not present

## 2011-09-20 DIAGNOSIS — M25519 Pain in unspecified shoulder: Secondary | ICD-10-CM | POA: Diagnosis not present

## 2011-09-20 DIAGNOSIS — S8010XA Contusion of unspecified lower leg, initial encounter: Secondary | ICD-10-CM | POA: Diagnosis not present

## 2011-09-20 DIAGNOSIS — Z7982 Long term (current) use of aspirin: Secondary | ICD-10-CM | POA: Diagnosis not present

## 2011-09-20 DIAGNOSIS — M7989 Other specified soft tissue disorders: Secondary | ICD-10-CM | POA: Diagnosis not present

## 2011-09-20 DIAGNOSIS — R609 Edema, unspecified: Secondary | ICD-10-CM | POA: Diagnosis not present

## 2011-10-13 DIAGNOSIS — E1149 Type 2 diabetes mellitus with other diabetic neurological complication: Secondary | ICD-10-CM | POA: Diagnosis not present

## 2011-10-13 DIAGNOSIS — E119 Type 2 diabetes mellitus without complications: Secondary | ICD-10-CM | POA: Diagnosis not present

## 2011-12-08 DIAGNOSIS — G4736 Sleep related hypoventilation in conditions classified elsewhere: Secondary | ICD-10-CM | POA: Diagnosis not present

## 2011-12-08 DIAGNOSIS — E1149 Type 2 diabetes mellitus with other diabetic neurological complication: Secondary | ICD-10-CM | POA: Diagnosis not present

## 2011-12-08 DIAGNOSIS — J449 Chronic obstructive pulmonary disease, unspecified: Secondary | ICD-10-CM | POA: Diagnosis not present

## 2011-12-08 DIAGNOSIS — G4733 Obstructive sleep apnea (adult) (pediatric): Secondary | ICD-10-CM | POA: Diagnosis not present

## 2011-12-22 DIAGNOSIS — E119 Type 2 diabetes mellitus without complications: Secondary | ICD-10-CM | POA: Diagnosis not present

## 2011-12-22 DIAGNOSIS — E1149 Type 2 diabetes mellitus with other diabetic neurological complication: Secondary | ICD-10-CM | POA: Diagnosis not present

## 2011-12-26 DIAGNOSIS — J449 Chronic obstructive pulmonary disease, unspecified: Secondary | ICD-10-CM | POA: Diagnosis not present

## 2012-02-10 DIAGNOSIS — R300 Dysuria: Secondary | ICD-10-CM | POA: Diagnosis not present

## 2012-02-11 DIAGNOSIS — R300 Dysuria: Secondary | ICD-10-CM | POA: Diagnosis not present

## 2012-02-17 DIAGNOSIS — E785 Hyperlipidemia, unspecified: Secondary | ICD-10-CM | POA: Diagnosis not present

## 2012-02-17 DIAGNOSIS — E119 Type 2 diabetes mellitus without complications: Secondary | ICD-10-CM | POA: Diagnosis not present

## 2012-02-17 DIAGNOSIS — I10 Essential (primary) hypertension: Secondary | ICD-10-CM | POA: Diagnosis not present

## 2012-02-26 DIAGNOSIS — N390 Urinary tract infection, site not specified: Secondary | ICD-10-CM | POA: Diagnosis not present

## 2012-02-26 DIAGNOSIS — K921 Melena: Secondary | ICD-10-CM | POA: Diagnosis not present

## 2012-03-01 DIAGNOSIS — E1149 Type 2 diabetes mellitus with other diabetic neurological complication: Secondary | ICD-10-CM | POA: Diagnosis not present

## 2012-03-01 DIAGNOSIS — E119 Type 2 diabetes mellitus without complications: Secondary | ICD-10-CM | POA: Diagnosis not present

## 2012-03-01 DIAGNOSIS — K625 Hemorrhage of anus and rectum: Secondary | ICD-10-CM | POA: Diagnosis not present

## 2012-04-23 DIAGNOSIS — M79609 Pain in unspecified limb: Secondary | ICD-10-CM | POA: Diagnosis not present

## 2012-04-23 DIAGNOSIS — R609 Edema, unspecified: Secondary | ICD-10-CM | POA: Diagnosis not present

## 2012-04-23 DIAGNOSIS — E1142 Type 2 diabetes mellitus with diabetic polyneuropathy: Secondary | ICD-10-CM | POA: Diagnosis not present

## 2012-05-17 DIAGNOSIS — E1149 Type 2 diabetes mellitus with other diabetic neurological complication: Secondary | ICD-10-CM | POA: Diagnosis not present

## 2012-05-17 DIAGNOSIS — E119 Type 2 diabetes mellitus without complications: Secondary | ICD-10-CM | POA: Diagnosis not present

## 2012-05-31 ENCOUNTER — Other Ambulatory Visit: Payer: Self-pay | Admitting: *Deleted

## 2012-05-31 MED ORDER — LABETALOL HCL 200 MG PO TABS: 200.0000 mg | ORAL_TABLET | Freq: Two times a day (BID) | ORAL | Status: DC

## 2012-06-17 DIAGNOSIS — E119 Type 2 diabetes mellitus without complications: Secondary | ICD-10-CM | POA: Diagnosis not present

## 2012-06-17 DIAGNOSIS — E785 Hyperlipidemia, unspecified: Secondary | ICD-10-CM | POA: Diagnosis not present

## 2012-06-17 DIAGNOSIS — N390 Urinary tract infection, site not specified: Secondary | ICD-10-CM | POA: Diagnosis not present

## 2012-06-17 DIAGNOSIS — M79609 Pain in unspecified limb: Secondary | ICD-10-CM | POA: Diagnosis not present

## 2012-06-23 ENCOUNTER — Encounter (HOSPITAL_COMMUNITY): Payer: Self-pay | Admitting: General Practice

## 2012-06-23 ENCOUNTER — Encounter: Payer: Self-pay | Admitting: Cardiology

## 2012-06-23 ENCOUNTER — Telehealth: Payer: Self-pay | Admitting: Cardiology

## 2012-06-23 ENCOUNTER — Inpatient Hospital Stay (HOSPITAL_COMMUNITY)
Admission: AD | Admit: 2012-06-23 | Discharge: 2012-06-29 | DRG: 292 | Disposition: A | Discharge status: Home Health | Payer: Medicare Other | Source: Ambulatory Visit | Attending: Cardiology | Admitting: Cardiology

## 2012-06-23 ENCOUNTER — Ambulatory Visit (INDEPENDENT_AMBULATORY_CARE_PROVIDER_SITE_OTHER): Payer: Medicare Other | Admitting: Cardiology

## 2012-06-23 VITALS — BP 143/69 | HR 72 | Ht 72.0 in | Wt 375.0 lb

## 2012-06-23 DIAGNOSIS — I5033 Acute on chronic diastolic (congestive) heart failure: Principal | ICD-10-CM

## 2012-06-23 DIAGNOSIS — G4733 Obstructive sleep apnea (adult) (pediatric): Secondary | ICD-10-CM | POA: Diagnosis present

## 2012-06-23 DIAGNOSIS — N179 Acute kidney failure, unspecified: Secondary | ICD-10-CM | POA: Diagnosis not present

## 2012-06-23 DIAGNOSIS — Z882 Allergy status to sulfonamides status: Secondary | ICD-10-CM

## 2012-06-23 DIAGNOSIS — I471 Supraventricular tachycardia, unspecified: Secondary | ICD-10-CM | POA: Diagnosis present

## 2012-06-23 DIAGNOSIS — I10 Essential (primary) hypertension: Secondary | ICD-10-CM | POA: Diagnosis present

## 2012-06-23 DIAGNOSIS — E1149 Type 2 diabetes mellitus with other diabetic neurological complication: Secondary | ICD-10-CM | POA: Diagnosis present

## 2012-06-23 DIAGNOSIS — Z880 Allergy status to penicillin: Secondary | ICD-10-CM

## 2012-06-23 DIAGNOSIS — Z79899 Other long term (current) drug therapy: Secondary | ICD-10-CM

## 2012-06-23 DIAGNOSIS — I509 Heart failure, unspecified: Secondary | ICD-10-CM | POA: Diagnosis not present

## 2012-06-23 DIAGNOSIS — E1142 Type 2 diabetes mellitus with diabetic polyneuropathy: Secondary | ICD-10-CM | POA: Diagnosis present

## 2012-06-23 DIAGNOSIS — I2510 Atherosclerotic heart disease of native coronary artery without angina pectoris: Secondary | ICD-10-CM | POA: Diagnosis not present

## 2012-06-23 DIAGNOSIS — M129 Arthropathy, unspecified: Secondary | ICD-10-CM | POA: Diagnosis present

## 2012-06-23 DIAGNOSIS — R5381 Other malaise: Secondary | ICD-10-CM | POA: Diagnosis not present

## 2012-06-23 DIAGNOSIS — Z6841 Body Mass Index (BMI) 40.0 and over, adult: Secondary | ICD-10-CM

## 2012-06-23 DIAGNOSIS — E876 Hypokalemia: Secondary | ICD-10-CM | POA: Diagnosis present

## 2012-06-23 DIAGNOSIS — Z888 Allergy status to other drugs, medicaments and biological substances status: Secondary | ICD-10-CM

## 2012-06-23 DIAGNOSIS — Z794 Long term (current) use of insulin: Secondary | ICD-10-CM

## 2012-06-23 DIAGNOSIS — R609 Edema, unspecified: Secondary | ICD-10-CM | POA: Diagnosis not present

## 2012-06-23 DIAGNOSIS — E785 Hyperlipidemia, unspecified: Secondary | ICD-10-CM | POA: Diagnosis present

## 2012-06-23 DIAGNOSIS — I059 Rheumatic mitral valve disease, unspecified: Secondary | ICD-10-CM

## 2012-06-23 LAB — CBC WITH DIFFERENTIAL/PLATELET
Eosinophils Absolute: 0.2 10*3/uL (ref 0.0–0.7)
Eosinophils Relative: 5 % (ref 0–5)
HCT: 30.3 % — ABNORMAL LOW (ref 39.0–52.0)
Hemoglobin: 10.5 g/dL — ABNORMAL LOW (ref 13.0–17.0)
Lymphocytes Relative: 21 % (ref 12–46)
Lymphs Abs: 0.9 10*3/uL (ref 0.7–4.0)
MCH: 33 pg (ref 26.0–34.0)
MCV: 95.3 fL (ref 78.0–100.0)
Monocytes Absolute: 0.9 10*3/uL (ref 0.1–1.0)
Monocytes Relative: 21 % — ABNORMAL HIGH (ref 3–12)
RBC: 3.18 MIL/uL — ABNORMAL LOW (ref 4.22–5.81)
WBC: 4.2 10*3/uL (ref 4.0–10.5)

## 2012-06-23 LAB — COMPREHENSIVE METABOLIC PANEL
AST: 27 U/L (ref 0–37)
Albumin: 3.2 g/dL — ABNORMAL LOW (ref 3.5–5.2)
BUN: 22 mg/dL (ref 6–23)
Chloride: 105 mEq/L (ref 96–112)
Creatinine, Ser: 1.29 mg/dL (ref 0.50–1.35)
Total Bilirubin: 0.6 mg/dL (ref 0.3–1.2)
Total Protein: 6.5 g/dL (ref 6.0–8.3)

## 2012-06-23 LAB — GLUCOSE, CAPILLARY
Glucose-Capillary: 118 mg/dL — ABNORMAL HIGH (ref 70–99)
Glucose-Capillary: 144 mg/dL — ABNORMAL HIGH (ref 70–99)

## 2012-06-23 LAB — PROTIME-INR: Prothrombin Time: 14.1 seconds (ref 11.6–15.2)

## 2012-06-23 MED ORDER — PERFLUTREN LIPID MICROSPHERE
1.0000 mL | INTRAVENOUS | Status: AC | PRN
  Administered 2012-06-23: 2 mL via INTRAVENOUS
  Filled 2012-06-23: qty 10

## 2012-06-23 MED ORDER — FUROSEMIDE 10 MG/ML IJ SOLN
30.0000 mg/h | INTRAVENOUS | Status: DC
  Administered 2012-06-23: 15 mg/h via INTRAVENOUS
  Administered 2012-06-24: 30 mg/h via INTRAVENOUS
  Administered 2012-06-24: 15 mg/h via INTRAVENOUS
  Administered 2012-06-25: 30 mg/h via INTRAVENOUS
  Filled 2012-06-23 (×9): qty 25

## 2012-06-23 MED ORDER — PERFLUTREN LIPID MICROSPHERE
INTRAVENOUS | Status: AC
  Filled 2012-06-23: qty 10

## 2012-06-23 MED ORDER — INSULIN GLARGINE 100 UNIT/ML ~~LOC~~ SOLN
40.0000 [IU] | Freq: Two times a day (BID) | SUBCUTANEOUS | Status: DC
  Administered 2012-06-23 – 2012-06-29 (×12): 40 [IU] via SUBCUTANEOUS

## 2012-06-23 MED ORDER — MONTELUKAST SODIUM 10 MG PO TABS
10.0000 mg | ORAL_TABLET | Freq: Every day | ORAL | Status: DC
  Administered 2012-06-23 – 2012-06-28 (×6): 10 mg via ORAL
  Filled 2012-06-23 (×7): qty 1

## 2012-06-23 MED ORDER — ONDANSETRON HCL 4 MG/2ML IJ SOLN: 4.0000 mg | Freq: Four times a day (QID) | INTRAMUSCULAR | Status: DC | PRN

## 2012-06-23 MED ORDER — TAMSULOSIN HCL 0.4 MG PO CAPS
0.4000 mg | ORAL_CAPSULE | Freq: Every day | ORAL | Status: DC
  Administered 2012-06-23 – 2012-06-29 (×6): 0.4 mg via ORAL
  Filled 2012-06-23 (×10): qty 1

## 2012-06-23 MED ORDER — ASPIRIN 81 MG PO CHEW
162.0000 mg | CHEWABLE_TABLET | Freq: Every day | ORAL | Status: DC
  Administered 2012-06-24 – 2012-06-29 (×6): 162 mg via ORAL
  Filled 2012-06-23 (×2): qty 1
  Filled 2012-06-23 (×2): qty 2
  Filled 2012-06-23: qty 1
  Filled 2012-06-23 (×2): qty 2
  Filled 2012-06-23: qty 1

## 2012-06-23 MED ORDER — CHOLECALCIFEROL 125 MCG (5000 UT) PO CAPS: 5000.0000 [IU] | ORAL_CAPSULE | Freq: Every day | ORAL | Status: DC

## 2012-06-23 MED ORDER — LORATADINE 10 MG PO TABS
10.0000 mg | ORAL_TABLET | Freq: Every evening | ORAL | Status: DC
  Administered 2012-06-23 – 2012-06-28 (×6): 10 mg via ORAL
  Filled 2012-06-23 (×7): qty 1

## 2012-06-23 MED ORDER — INSULIN ASPART 100 UNIT/ML ~~LOC~~ SOLN: 0.0000 [IU] | Freq: Three times a day (TID) | SUBCUTANEOUS | Status: DC

## 2012-06-23 MED ORDER — SODIUM CHLORIDE 0.9 % IJ SOLN: 3.0000 mL | INTRAMUSCULAR | Status: DC | PRN

## 2012-06-23 MED ORDER — SODIUM CHLORIDE 0.9 % IV SOLN
250.0000 mL | INTRAVENOUS | Status: DC | PRN
  Administered 2012-06-26: 250 mL via INTRAVENOUS

## 2012-06-23 MED ORDER — LIDOCAINE 5 % EX PTCH
1.0000 | MEDICATED_PATCH | Freq: Every day | CUTANEOUS | Status: DC | PRN
  Administered 2012-06-25 – 2012-06-28 (×4): 1 via TRANSDERMAL
  Filled 2012-06-23 (×6): qty 1

## 2012-06-23 MED ORDER — SIMVASTATIN 40 MG PO TABS
40.0000 mg | ORAL_TABLET | Freq: Every day | ORAL | Status: DC
  Administered 2012-06-23 – 2012-06-28 (×6): 40 mg via ORAL
  Filled 2012-06-23 (×7): qty 1

## 2012-06-23 MED ORDER — CENTRUM SILVER PO TABS: 1.0000 | ORAL_TABLET | Freq: Every day | ORAL | Status: DC

## 2012-06-23 MED ORDER — INSULIN ASPART 100 UNIT/ML ~~LOC~~ SOLN
0.0000 [IU] | Freq: Three times a day (TID) | SUBCUTANEOUS | Status: DC
  Administered 2012-06-24: 3 [IU] via SUBCUTANEOUS
  Administered 2012-06-24: 7 [IU] via SUBCUTANEOUS
  Administered 2012-06-24: 4 [IU] via SUBCUTANEOUS
  Administered 2012-06-25: 3 [IU] via SUBCUTANEOUS
  Administered 2012-06-25: 7 [IU] via SUBCUTANEOUS
  Administered 2012-06-25: 4 [IU] via SUBCUTANEOUS
  Administered 2012-06-26 (×2): 3 [IU] via SUBCUTANEOUS
  Administered 2012-06-26: 4 [IU] via SUBCUTANEOUS
  Administered 2012-06-27 (×2): 11 [IU] via SUBCUTANEOUS
  Administered 2012-06-27: 4 [IU] via SUBCUTANEOUS
  Administered 2012-06-28: 7 [IU] via SUBCUTANEOUS
  Administered 2012-06-28: 3 [IU] via SUBCUTANEOUS
  Administered 2012-06-28: 4 [IU] via SUBCUTANEOUS
  Administered 2012-06-29: 3 [IU] via SUBCUTANEOUS
  Administered 2012-06-29: 4 [IU] via SUBCUTANEOUS

## 2012-06-23 MED ORDER — FLUTICASONE PROPIONATE 50 MCG/ACT NA SUSP
2.0000 | Freq: Every day | NASAL | Status: DC
  Administered 2012-06-24 – 2012-06-29 (×6): 2 via NASAL
  Filled 2012-06-23: qty 16

## 2012-06-23 MED ORDER — ACETAMINOPHEN 500 MG PO TABS
1000.0000 mg | ORAL_TABLET | Freq: Two times a day (BID) | ORAL | Status: DC | PRN
  Administered 2012-06-25: 1000 mg via ORAL
  Filled 2012-06-23: qty 2

## 2012-06-23 MED ORDER — HYDROCODONE-ACETAMINOPHEN 5-325 MG PO TABS
1.0000 | ORAL_TABLET | Freq: Four times a day (QID) | ORAL | Status: DC | PRN
  Administered 2012-06-23 – 2012-06-29 (×16): 1 via ORAL
  Filled 2012-06-23 (×18): qty 1

## 2012-06-23 MED ORDER — LABETALOL HCL 200 MG PO TABS
200.0000 mg | ORAL_TABLET | Freq: Two times a day (BID) | ORAL | Status: DC
  Administered 2012-06-23 – 2012-06-29 (×12): 200 mg via ORAL
  Filled 2012-06-23 (×13): qty 1

## 2012-06-23 MED ORDER — VITAMIN D3 25 MCG (1000 UNIT) PO TABS
5000.0000 [IU] | ORAL_TABLET | Freq: Every day | ORAL | Status: DC
  Administered 2012-06-24 – 2012-06-29 (×6): 5000 [IU] via ORAL
  Filled 2012-06-23 (×6): qty 5

## 2012-06-23 MED ORDER — GABAPENTIN 300 MG PO CAPS
300.0000 mg | ORAL_CAPSULE | Freq: Every day | ORAL | Status: DC
  Administered 2012-06-23 – 2012-06-28 (×6): 300 mg via ORAL
  Filled 2012-06-23 (×7): qty 1

## 2012-06-23 MED ORDER — INSULIN ASPART 100 UNIT/ML ~~LOC~~ SOLN: 0.0000 [IU] | Freq: Every day | SUBCUTANEOUS | Status: DC

## 2012-06-23 MED ORDER — POTASSIUM CHLORIDE CRYS ER 10 MEQ PO TBCR
10.0000 meq | EXTENDED_RELEASE_TABLET | Freq: Two times a day (BID) | ORAL | Status: DC
  Administered 2012-06-23 – 2012-06-24 (×2): 10 meq via ORAL
  Filled 2012-06-23 (×3): qty 1

## 2012-06-23 MED ORDER — LATANOPROST 0.005 % OP SOLN
1.0000 [drp] | Freq: Every day | OPHTHALMIC | Status: DC
  Administered 2012-06-23 – 2012-06-28 (×6): 1 [drp] via OPHTHALMIC
  Filled 2012-06-23: qty 2.5

## 2012-06-23 MED ORDER — ASPIRIN 81 MG PO TABS: 162.0000 mg | ORAL_TABLET | Freq: Every day | ORAL | Status: DC

## 2012-06-23 MED ORDER — SODIUM CHLORIDE 0.9 % IJ SOLN
3.0000 mL | Freq: Two times a day (BID) | INTRAMUSCULAR | Status: DC
  Administered 2012-06-25 – 2012-06-29 (×6): 3 mL via INTRAVENOUS

## 2012-06-23 MED ORDER — HEPARIN SODIUM (PORCINE) 5000 UNIT/ML IJ SOLN
5000.0000 [IU] | Freq: Three times a day (TID) | INTRAMUSCULAR | Status: DC
  Administered 2012-06-23 – 2012-06-29 (×18): 5000 [IU] via SUBCUTANEOUS
  Filled 2012-06-23 (×20): qty 1

## 2012-06-23 MED ORDER — ADULT MULTIVITAMIN W/MINERALS CH
1.0000 | ORAL_TABLET | Freq: Every day | ORAL | Status: DC
  Administered 2012-06-24 – 2012-06-29 (×6): 1 via ORAL
  Filled 2012-06-23 (×6): qty 1

## 2012-06-23 NOTE — H&P (Signed)
See below for Dr. Ival Bible note from today which serves as the patient's H&P. Dayna Dunn PA-C ------------  06/23/2012   Clinical Summary   Mr. Gange is a 79 y.o.male presenting for followup. I have not seen him in the office since December 2012. History is reviewed below. He has had long-term difficulties with morbid obesity, however has become functionally more limited over the last several months, now using a wheelchair, and not able to walk any significant distance at all. He has also had worsening leg edema and anasarca, despite escalating doses of Demadex per his primary care physician. I'm not certain about the status of his renal function, no recent lab work available. He does state that he saw Dr. Margo Aye recently. BMET as of October 2013 revealed BUN 22, creatinine 1.2.  Today's weight was estimated by the patient, he was not weighed in the office, unable to stand up. ECG reviewed showing sinus rhythm with PVCs, nonspecific ST-T changes. He does not endorse any angina symptoms. Has chronic NYHA class III dyspnea.  He also reports having recent "sinus problems" with headaches and drainage from his ears. Reportedly he is to be seeing ENT in [place] under the direction of Dr. Margo Aye, details are not clear. He has significantly reduced hearing.  Echocardiogram in May 2012 demonstrated mild to moderate LVH with LVEF 60-65%, grade 2 diastolic dysfunction, mildly dilated left ventricle, mild left atrial enlargement, mild mitral regurgitation, mildly dilated aortic root.  Allergies   Allergen  Reactions   .  Penicillins    .  Piroxicam    .  Sulfonamide Derivatives     Current Outpatient Prescriptions   Medication  Sig  Dispense  Refill   .  acetaminophen (TYLENOL) 500 MG tablet  Take 2 tabs twice a day     .  aspirin 81 MG tablet  Take 2 tabs every a.m.     Marland Kitchen  cetirizine (ZYRTEC) 10 MG tablet  Take 10 mg by mouth every evening.     .  Cholecalciferol (D-3-5) 5000 UNITS capsule  Take 5,000  Units by mouth daily.     .  fluticasone (FLONASE) 50 MCG/ACT nasal spray  Place 2 sprays into the nose daily.     Marland Kitchen  gabapentin (NEURONTIN) 300 MG capsule  Take 300 mg by mouth at bedtime.     .  Insulin Aspart (NOVOLOG FLEXPEN [place])  Inject into the skin. Sliding scale     .  insulin glargine (LANTUS) 100 UNIT/ML injection  70 units twice a day     .  labetalol (NORMODYNE) 200 MG tablet  Take 1 tablet (200 mg total) by mouth 2 (two) times daily.  180 tablet  3   .  latanoprost (XALATAN) 0.005 % ophthalmic solution  Place 1 drop into the right eye at bedtime.     .  lidocaine (LIDODERM) 5 %  Place 1 patch onto the skin daily. Remove & Discard patch within 12 hours or as directed by MD. As needed.     .  montelukast (SINGULAIR) 10 MG tablet  Take 10 mg by mouth at bedtime.     .  Multiple Vitamins-Minerals (CENTRUM SILVER PO)  Take 1 tablet by mouth daily.     .  potassium chloride (KLOR-CON) 10 MEQ CR tablet  Take 10 mEq by mouth 2 (two) times daily.     .  simvastatin (ZOCOR) 40 MG tablet  Take 40 mg by mouth at bedtime.     Marland Kitchen  Tamsulosin HCl (FLOMAX) 0.4 MG CAPS  Take 0.4 mg by mouth at bedtime.     .  torsemide (DEMADEX) 100 MG tablet  Take 100 mg by mouth daily.      No current facility-administered medications for this visit.    Past Medical History   Diagnosis  Date   .  PSVT (paroxysmal supraventricular tachycardia)      AVNRT   .  Coronary atherosclerosis of native coronary artery      Nonobstructive, LVEF 65%   .  Hyperlipidemia    .  Morbid obesity    .  Arthritis    .  Type 2 diabetes mellitus    .  Vertigo    .  Venous insufficiency    .  Sleep apnea, obstructive    .  Chronic diastolic heart failure     Past Surgical History   Procedure  Laterality  Date   .  Left arm surgery      Social History  Mr. Gillie reports that he has never smoked. He has never used smokeless tobacco.  Mr. Kanaan reports that he does not drink alcohol.  Review of Systems  No cough or  hemoptysis. Episodic pain in his legs and lower back with inability to walk any significant distance. No angina or palpitations. Otherwise as outlined above.  Physical Examination  Filed Vitals:    06/23/12 0815   BP:  143/69   Pulse:  72    Filed Weights    06/23/12 0815   Weight:  375 lb (170.099 kg)    Morbidly obese male in no acute distress. Seated in wheelchair.  HEENT: Chronic left eye deviation with lid droop, oropharynx with poor dentition.  Neck: Supple, without carotid bruits and increased girth. Difficult to assess JVP.  Lungs: Clear, diminished overall, nonlabored.  Cardiac: Regular rate and rhythm with ectopic beats, no S3. Indistinct PMI.  Abdomen: Morbidly obese, bowel sounds present, nontender.  Extremities: Marked, firm edema bilaterally, left worse than right, up to the level of the thighs.  Musculoskeletal: No kyphosis.  Skin: Dry, distal venous stasis.  Neuropsychiatric: Alert and oriented x3, very hard of hearing .  Problem List and Plan   Acute on chronic diastolic heart failure  Evidence of marked fluid overload with significant leg edema/anasarca. Morbid obesity limits examination and JVP difficult to assess. Last LVEF was 60-65% as of 2012 with grade 2 diastolic dysfunction at that time. He has been on escalating doses of Demadex under the direction of Dr. Margo Aye, followup creatinine not certain. Patient is functionally debilitated, now using a wheelchair and unable to walk any significant distance. I have ecommended hospitalization for intravenous diuresis. Patient will be electively admitted to [facility] today, he will need a followup echocardiogram, consultation with the Heart Failure service to guide management. Leg edema is worse on the left, probably need to exclude DVT, although expect that this examination will be quite difficult until he gets better management of his fluid status.   MORBID OBESITY  Chronic problem with weight increasing over  time. We have discussed diet and exercise over time, but he has not been able to achieve any significant weight loss. He is very functionally limited at this time. Once his volume status has improved somewhat, he will need PT/OT consultation for further assessment.   CAD, NATIVE VESSEL  No active angina symptoms. ECG with nonspecific ST-T changes. Has history of nonobstructive disease at prior catheterization.   ESSENTIAL HYPERTENSION, BENIGN  Blood pressure mildly elevated today. He reports compliance with medications.   SVT/ PSVT/ PAT  Has history of AVNRT, no complaint of palpitations or obvious breakthrough events described.   Jonelle Sidle, M.D., F.A.C.C.

## 2012-06-23 NOTE — Assessment & Plan Note (Signed)
Has history of AVNRT, no complaint of palpitations or obvious breakthrough events described.

## 2012-06-23 NOTE — Progress Notes (Signed)
Discussed PICC insertion including risks, benefits, and alternatives with patient. Gave patient and wife printed education material. Patient stated he wanted to speak with MD first before consenting. Primary RN notified. Christeen Brandell RN [facility]

## 2012-06-23 NOTE — Progress Notes (Signed)
Pt's home insulin dose is listed in a few different ways on med rec (ie Lantus 40 versus 70 BID, plus his sliding scale needs clarification). Will ask for pharmacy assistance to fix med rec.    Gwenivere Hiraldo PA-C

## 2012-06-23 NOTE — Assessment & Plan Note (Signed)
Blood pressure mildly elevated today. He reports compliance with medications.

## 2012-06-23 NOTE — Assessment & Plan Note (Addendum)
Chronic problem with weight increasing over time. We have discussed diet and exercise over time, but he has not been able to achieve any significant weight loss. He is very functionally limited at this time. Once his volume status has improved somewhat, he will need PT/OT consultation for further assessment.

## 2012-06-23 NOTE — Telephone Encounter (Signed)
Admission to [facility] [id number] diastolic heart failure with volume overload

## 2012-06-23 NOTE — Assessment & Plan Note (Signed)
Evidence of marked fluid overload with significant leg edema/anasarca. Morbid obesity limits examination and JVP difficult to assess. Last LVEF was 60-65% as of 2012 with grade 2 diastolic dysfunction at that time. He has been on escalating doses of Demadex under the direction of Dr. Margo Aye, followup creatinine not certain. Patient is functionally debilitated, now using a wheelchair and unable to walk any significant distance. I have ecommended hospitalization for intravenous diuresis. Patient will be electively admitted to [facility] today, he will need a followup echocardiogram, consultation with the Heart Failure service to guide management. Leg edema is worse on the left, probably need to exclude DVT, although expect that this examination will be quite difficult until he gets better management of his fluid status.

## 2012-06-23 NOTE — Assessment & Plan Note (Signed)
No active angina symptoms. ECG with nonspecific ST-T changes. Has history of nonobstructive disease at prior catheterization.

## 2012-06-23 NOTE — H&P (Signed)
Patient being admitted to Dr. Antoine Poche with input from the Advanced Heart Failure service. This is my office note from earlier today.

## 2012-06-23 NOTE — Progress Notes (Signed)
Urine cath place by Rn Violia Knopf NT juanita aided. sterile tech used

## 2012-06-23 NOTE — Patient Instructions (Signed)
   Admission to [facility].  You will be called from the hospital when they are ready for you to come today.

## 2012-06-23 NOTE — Progress Notes (Signed)
Echocardiogram 2D Echocardiogram with Definity has been performed.  [name] 06/23/2012, 3:09 PM

## 2012-06-23 NOTE — Progress Notes (Signed)
Clinical Summary [name] is a 79 y.o.male presenting for followup. I have not seen him in the office since December 2012. History is reviewed below. He has had long-term difficulties with morbid obesity, however has become functionally more limited over the last several months, now using a wheelchair, and not able to walk any significant distance at all. He has also had worsening leg edema and anasarca, despite escalating doses of Demadex per his primary care physician. I'm not certain about the status of his renal function, no recent lab work available. He does state that he saw Dr. Margo Aye recently. BMET as of October 2013 revealed BUN 22, creatinine 1.2.  Today's weight was estimated by the patient, he was not weighed in the office, unable to stand up. ECG reviewed showing sinus rhythm with PVCs, nonspecific ST-T changes. He does not endorse any angina symptoms. Has chronic NYHA class III dyspnea.  He also reports having recent "sinus problems" with headaches and drainage from his ears. Reportedly he is to be seeing ENT in [place] under the direction of Dr. Margo Aye, details are not clear. He has significantly reduced hearing.  Echocardiogram in May 2012 demonstrated mild to moderate LVH with LVEF 60-65%, grade 2 diastolic dysfunction, mildly dilated left ventricle, mild left atrial enlargement, mild mitral regurgitation, mildly dilated aortic root.   Allergies  Allergen Reactions  . Penicillins   . Piroxicam   . Sulfonamide Derivatives     Current Outpatient Prescriptions  Medication Sig Dispense Refill  . acetaminophen (TYLENOL) 500 MG tablet Take 2 tabs twice a day       . aspirin 81 MG tablet Take 2 tabs every a.m.      Marland Kitchen cetirizine (ZYRTEC) 10 MG tablet Take 10 mg by mouth every evening.       . Cholecalciferol (D-3-5) 5000 UNITS capsule Take 5,000 Units by mouth daily.        . fluticasone (FLONASE) 50 MCG/ACT nasal spray Place 2 sprays into the nose daily.       Marland Kitchen gabapentin  (NEURONTIN) 300 MG capsule Take 300 mg by mouth at bedtime.      . Insulin Aspart (NOVOLOG FLEXPEN [place]) Inject into the skin. Sliding scale       . insulin glargine (LANTUS) 100 UNIT/ML injection 70 units twice a day       . labetalol (NORMODYNE) 200 MG tablet Take 1 tablet (200 mg total) by mouth 2 (two) times daily.  180 tablet  3  . latanoprost (XALATAN) 0.005 % ophthalmic solution Place 1 drop into the right eye at bedtime.       . lidocaine (LIDODERM) 5 % Place 1 patch onto the skin daily. Remove & Discard patch within 12 hours or as directed by MD.  As needed.      . montelukast (SINGULAIR) 10 MG tablet Take 10 mg by mouth at bedtime.        . Multiple Vitamins-Minerals (CENTRUM SILVER PO) Take 1 tablet by mouth daily.        . potassium chloride (KLOR-CON) 10 MEQ CR tablet Take 10 mEq by mouth 2 (two) times daily.        . simvastatin (ZOCOR) 40 MG tablet Take 40 mg by mouth at bedtime.        . Tamsulosin HCl (FLOMAX) 0.4 MG CAPS Take 0.4 mg by mouth at bedtime.       . torsemide (DEMADEX) 100 MG tablet Take 100 mg by mouth daily.  No current facility-administered medications for this visit.    Past Medical History  Diagnosis Date  . PSVT (paroxysmal supraventricular tachycardia)     AVNRT  . Coronary atherosclerosis of native coronary artery     Nonobstructive, LVEF 65%  . Hyperlipidemia   . Morbid obesity   . Arthritis   . Type 2 diabetes mellitus   . Vertigo   . Venous insufficiency   . Sleep apnea, obstructive   . Chronic diastolic heart failure     Past Surgical History  Procedure Laterality Date  . Left arm surgery      Social History [name] reports that he has never smoked. He has never used smokeless tobacco. [name] reports that he does not drink alcohol.  Review of Systems No cough or hemoptysis. Episodic pain in his legs and lower back with inability to walk any significant distance. No angina or palpitations. Otherwise as outlined  above.  Physical Examination Filed Vitals:   06/23/12 0815  BP: 143/69  Pulse: 72   Filed Weights   06/23/12 0815  Weight: 375 lb (170.099 kg)    Morbidly obese male in no acute distress. Seated in wheelchair. HEENT: Chronic left eye deviation with lid droop, oropharynx with poor dentition.  Neck: Supple, without carotid bruits and increased girth. Difficult to assess JVP.  Lungs: Clear, diminished overall, nonlabored.  Cardiac: Regular rate and rhythm with ectopic beats, no S3. Indistinct PMI. Abdomen: Morbidly obese, bowel sounds present, nontender.  Extremities: Marked, firm edema bilaterally, left worse than right, up to the level of the thighs. Musculoskeletal: No kyphosis. Skin: Dry, distal venous stasis. Neuropsychiatric: Alert and oriented x3, very hard of hearing.   Problem List and Plan   Acute on chronic diastolic heart failure Evidence of marked fluid overload with significant leg edema/anasarca. Morbid obesity limits examination and JVP difficult to assess. Last LVEF was 60-65% as of 2012 with grade 2 diastolic dysfunction at that time. He has been on escalating doses of Demadex under the direction of Dr. Margo Aye, followup creatinine not certain. Patient is functionally debilitated, now using a wheelchair and unable to walk any significant distance. I have ecommended hospitalization for intravenous diuresis. Patient will be electively admitted to [facility] today, he will need a followup echocardiogram, consultation with the Heart Failure service to guide management. Leg edema is worse on the left, probably need to exclude DVT, although expect that this examination will be quite difficult until he gets better management of his fluid status.  MORBID OBESITY Chronic problem with weight increasing over time. We have discussed diet and exercise over time, but he has not been able to achieve any significant weight loss. He is very functionally limited at this time. Once  his volume status has improved somewhat, he will need PT/OT consultation for further assessment.  CAD, NATIVE VESSEL No active angina symptoms. ECG with nonspecific ST-T changes. Has history of nonobstructive disease at prior catheterization.  ESSENTIAL HYPERTENSION, BENIGN Blood pressure mildly elevated today. He reports compliance with medications.  SVT/ PSVT/ PAT Has history of AVNRT, no complaint of palpitations or obvious breakthrough events described.    Jonelle Sidle, M.D., F.A.C.C.

## 2012-06-24 ENCOUNTER — Inpatient Hospital Stay (HOSPITAL_COMMUNITY): Payer: Medicare Other

## 2012-06-24 DIAGNOSIS — I5033 Acute on chronic diastolic (congestive) heart failure: Principal | ICD-10-CM

## 2012-06-24 LAB — BASIC METABOLIC PANEL
BUN: 20 mg/dL (ref 6–23)
Chloride: 102 mEq/L (ref 96–112)
GFR calc Af Amer: 65 mL/min — ABNORMAL LOW (ref >90)
GFR calc non Af Amer: 56 mL/min — ABNORMAL LOW (ref >90)
Potassium: 3.2 mEq/L — ABNORMAL LOW (ref 3.5–5.1)

## 2012-06-24 LAB — IRON AND TIBC
Saturation Ratios: 22 % (ref 20–55)
UIBC: 189 ug/dL (ref 125–400)

## 2012-06-24 LAB — GLUCOSE, CAPILLARY
Glucose-Capillary: 148 mg/dL — ABNORMAL HIGH (ref 70–99)
Glucose-Capillary: 201 mg/dL — ABNORMAL HIGH (ref 70–99)

## 2012-06-24 MED ORDER — POTASSIUM CHLORIDE CRYS ER 20 MEQ PO TBCR
40.0000 meq | EXTENDED_RELEASE_TABLET | Freq: Two times a day (BID) | ORAL | Status: DC
  Administered 2012-06-24 – 2012-06-27 (×8): 40 meq via ORAL
  Filled 2012-06-24 (×11): qty 2

## 2012-06-24 MED ORDER — METOLAZONE 5 MG PO TABS
5.0000 mg | ORAL_TABLET | Freq: Two times a day (BID) | ORAL | Status: DC
  Administered 2012-06-24 – 2012-06-25 (×3): 5 mg via ORAL
  Filled 2012-06-24 (×4): qty 1

## 2012-06-24 MED ORDER — FUROSEMIDE 10 MG/ML IJ SOLN
80.0000 mg | Freq: Once | INTRAMUSCULAR | Status: AC
  Administered 2012-06-24: 80 mg via INTRAVENOUS

## 2012-06-24 NOTE — Progress Notes (Signed)
Placed patient on auto titrating CPAP with 3.5L Oxygen bled in.  Patient is using home mask and hospital unit.  RT will continue to monitor.  Made patient aware that if he needed assistance to page RT and we would come assist him.

## 2012-06-24 NOTE — Progress Notes (Addendum)
06/24/12 1515 In to complete Heart Failure Home Health Screen.  Pt. currently has CPAP machine with oxygen bleed in, wheelchair, walker, and cane.  Pt. is interested in having a HH RN for Heart Failure.  Gave pt. list of agencies and he chose [facility].  TC to Lupita Leash, with [facility], to give referral for [facility] RN.  Physician, please write Home Health orders for [facility] RN and complete Face to Face documentation for Medicare.   Tera Mater, RN, BSN  NCM [phone]  06/24/12 1620 Pt. Interested in having Physical therapy as well.  Will add to Home health orders. Tera Mater, RN, BSN NCM [phone]

## 2012-06-24 NOTE — Progress Notes (Signed)
Took pt CPAP machine. He decided that he did not want to wear it because he did not like our mask. I tried to get him to wear it for a little bit to see how it felt to him. He declined. I encouraged him to have someone bring his home mask and he said he would try to find someone to bring it to him.
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
## 2012-06-24 NOTE — Plan of Care (Signed)
Problem: Undesirable Food Choices (NB-1.7) Goal: Nutrition education Formal process to instruct or train a patient/client in a skill or to impart knowledge to help patients/clients voluntarily manage or modify food choices and eating behavior to maintain or improve health. Outcome: Completed/Met Date Met:  06/24/12 Nutrition Education Note  RD consulted for nutrition education regarding CHF.  RD provided "Low Sodium Nutrition Therapy" handout from the Academy of [employer]. Reviewed patient's dietary recall. Provided examples on ways to decrease sodium intake in diet. Discouraged intake of processed foods and use of salt shaker. Encouraged fresh fruits and vegetables as well as whole grain sources of carbohydrates to maximize fiber intake.   RD discussed why it is important for patient to adhere to diet recommendations, and emphasized the role of fluids, foods to avoid, and importance of weighing self daily. Teach back method used.  Expect poor compliance.  Body mass index is 55.3 kg/(m^2). Pt meets criteria for Obesity class 3, extreme  based on current BMI.  Current diet order is Carb Mod MEdium, patient is consuming approximately 100% of meals at this time. Labs and medications reviewed. No further nutrition interventions warranted at this time. RD contact information provided. If additional nutrition issues arise, please re-consult RD.   Clarene Duke RD, LDN Pager [phone] After Hours pager [phone]

## 2012-06-24 NOTE — Progress Notes (Signed)
Pt does not want to wear out CPAP machine. His son is supposed to be bringing his home unit tonight. PT states that he does not like our mask and he does not know what settings he is on,. He would prefer his machine. RT request pt call when he does so that we can pick up our machine. RN made aware. RT will check back.

## 2012-06-24 NOTE — Progress Notes (Signed)
In to obtain consent for PICC placement, Pt states that he is still unsure about PICC and questions the need. He stated he would need further explanation from the MD before consenting to procedure. Pt's primary RN aware.

## 2012-06-25 DIAGNOSIS — R609 Edema, unspecified: Secondary | ICD-10-CM

## 2012-06-25 LAB — GLUCOSE, CAPILLARY
Glucose-Capillary: 135 mg/dL — ABNORMAL HIGH (ref 70–99)
Glucose-Capillary: 157 mg/dL — ABNORMAL HIGH (ref 70–99)

## 2012-06-25 LAB — BASIC METABOLIC PANEL
BUN: 23 mg/dL (ref 6–23)
Chloride: 98 mEq/L (ref 96–112)
Creatinine, Ser: 1.37 mg/dL — ABNORMAL HIGH (ref 0.50–1.35)
GFR calc Af Amer: 55 mL/min — ABNORMAL LOW (ref >90)
GFR calc non Af Amer: 47 mL/min — ABNORMAL LOW (ref >90)
Glucose, Bld: 142 mg/dL — ABNORMAL HIGH (ref 70–99)
Potassium: 3.4 mEq/L — ABNORMAL LOW (ref 3.5–5.1)

## 2012-06-25 MED ORDER — METOLAZONE 5 MG PO TABS
5.0000 mg | ORAL_TABLET | Freq: Every day | ORAL | Status: DC
  Administered 2012-06-26 – 2012-06-27 (×2): 5 mg via ORAL
  Filled 2012-06-25 (×3): qty 1

## 2012-06-25 MED ORDER — FUROSEMIDE 10 MG/ML IJ SOLN
15.0000 mg/h | INTRAVENOUS | Status: DC
  Administered 2012-06-25 – 2012-06-26 (×3): 20 mg/h via INTRAVENOUS
  Administered 2012-06-27: 15 mg/h via INTRAVENOUS
  Administered 2012-06-27: 20 mg/h via INTRAVENOUS
  Filled 2012-06-25 (×11): qty 25

## 2012-06-25 MED ORDER — POTASSIUM CHLORIDE CRYS ER 20 MEQ PO TBCR
40.0000 meq | EXTENDED_RELEASE_TABLET | Freq: Once | ORAL | Status: AC
  Administered 2012-06-25: 40 meq via ORAL

## 2012-06-25 NOTE — Progress Notes (Addendum)
Advanced Heart Failure Rounding Note   Subjective:   [name] is a 84 year with a PMH of morbid obesity, chronic diastolic heart failure EF 50% (previous EF 60-65%), HTN, SVT/PSVT/PAT, CAD, DMII, OSA, arthritis, fiber myalgia, and hyperlipidemia.   [name] says that he has gained 100 pounds over the last year. Progressive dyspnea and edema over the last month. Over the last few months he has transitioned from a cane, to a walker, and now uses wheelchair. Uses CPAP at night. He also reports drinking sports drinks suggested by his pharmacist which aided in weight gain. Does not weigh at home. Lives with his wife. Limited family support.    Admitted from Dr [facility] office with increased lower extremity edema despite increase outpatient diuretics by his PCP. +orthopnea and PND Prior to admit Demadex was increased to 100 mg twice a day with poor response.   Yesterday Lasix drip increased to 30 mg per hour and Metolazone 5 mg bid added due to sluggish diuresis. Brisk diuresis. 24 hour I/O -6.4 liters. Weight down 10 pounds. Cr up slightly    Objective:   Weight Range:  Vital Signs:   Temp:  [97.9 F (36.6 C)-98.3 F (36.8 C)] 97.9 F (36.6 C) (02/28 0925) Pulse Rate:  [68-74] 74 (02/28 0925) Resp:  [18-20] 20 (02/28 0925) BP: (99-168)/(61-68) 133/64 mmHg (02/28 0930) SpO2:  [91 %-97 %] 91 % (02/28 0925) Weight:  [180.4 kg (397 lb 11.4 oz)] 180.4 kg (397 lb 11.4 oz) (02/28 0516) Last BM Date: 06/23/12  Weight change: Filed Weights   06/23/12 1300 06/24/12 0508 06/25/12 0516  Weight: 185.2 kg (408 lb 4.7 oz) 185 kg (407 lb 13.6 oz) 180.4 kg (397 lb 11.4 oz)    Intake/Output:   Intake/Output Summary (Last 24 hours) at 06/25/12 1018 Last data filed at 06/25/12 0916  Gross per 24 hour  Intake 1793.75 ml  Output   9400 ml  Net -7606.25 ml     Physical Exam: General: Morbidly obese. Chronically ill appearing. No resp difficulty Wife at bedside  HEENT: normal except for blind in  L eye  Neck: supple. JVP to ear . Carotids 2+ bilat; no bruits. No lymphadenopathy or thryomegaly appreciated.  Cor: PMI nonpalpable. Regular rate & rhythm. No rubs, gallops or murmurs.  Lungs: clear  Abdomen: morbidly obese, soft, nontender, + distended. . No bruits or masses. Good bowel sounds.  Extremities: no cyanosis, clubbing, rash, RLL 1+ LLE 3+ edema severe chronic venous stasis changes.  Neuro: alert & orientedx3, cranial nerves grossly intact. moves all 4 extremities w/o difficulty. Affect pleasant     Telemetry: SR  Labs: Basic Metabolic Panel:  Recent Labs Lab 06/23/12 1530 06/24/12 0612 06/25/12 0640  NA 144 140 140  K 4.0 3.2* 3.4*  CL 105 102 98  CO2 30 28 30   GLUCOSE 112* 147* 142*  BUN 22 20 23   CREATININE 1.29 1.19 1.37*  CALCIUM 9.3 8.9 9.4    Liver Function Tests:  Recent Labs Lab 06/23/12 1530  AST 27  ALT 13  ALKPHOS 59  BILITOT 0.6  PROT 6.5  ALBUMIN 3.2*   No results found for this basename: LIPASE, AMYLASE,  in the last 168 hours No results found for this basename: AMMONIA,  in the last 168 hours  CBC:  Recent Labs Lab 06/23/12 1530  WBC 4.2  NEUTROABS 2.2  HGB 10.5*  HCT 30.3*  MCV 95.3  PLT 94*    Cardiac Enzymes: No results found for this  basename: CKTOTAL, CKMB, CKMBINDEX, TROPONINI,  in the last 168 hours  BNP: BNP (last 3 results) No results found for this basename: PROBNP,  in the last 8760 hours     Imaging: Dg Chest 2 View  06/24/2012  *RADIOLOGY REPORT*  Clinical Data: Short of breath, CHF  CHEST - 2 VIEW  Comparison: 09/10/2010  Findings: Increased interstitial markings, possibly reflecting mild interstitial edema.  Mild left basilar atelectasis with suspected small left pleural effusion.  No pneumothorax.  Cardiomegaly.  Degenerative changes of the visualized thoracolumbar spine.  IMPRESSION: Cardiomegaly with possible mild interstitial edema.  Mild left basilar atelectasis with suspected small left pleural  effusion.   Original Report Authenticated By: Charline Bills, M.D.      Medications:     Scheduled Medications: . aspirin  162 mg Oral Daily  . cholecalciferol  5,000 Units Oral Daily  . fluticasone  2 spray Each Nare Daily  . gabapentin  300 mg Oral QHS  . heparin  5,000 Units Subcutaneous Q8H  . insulin aspart  0-20 Units Subcutaneous TID WC  . insulin glargine  40 Units Subcutaneous BID  . labetalol  200 mg Oral BID  . latanoprost  1 drop Right Eye QHS  . loratadine  10 mg Oral QPM  . metolazone  5 mg Oral BID  . montelukast  10 mg Oral QHS  . multivitamin with minerals  1 tablet Oral Daily  . potassium chloride  40 mEq Oral BID  . potassium chloride  40 mEq Oral Once  . simvastatin  40 mg Oral QHS  . sodium chloride  3 mL Intravenous Q12H  . tamsulosin  0.4 mg Oral QHS    Infusions: . furosemide (LASIX) infusion 30 mg/hr (06/25/12 0237)    PRN Medications: sodium chloride, acetaminophen, HYDROcodone-acetaminophen, lidocaine, ondansetron (ZOFRAN) IV, sodium chloride   Assessment:   1. Acute Diastolic Heart Failure  2. Morbid Obesity  3. OSA  4. HTN  5. Arthritis  6. DMII  7. CAD  8. Fiber Myalgia  9. L eye blind  10. Severe deconditioning  11. Hypokalemia  12. Hearing loss- over the last year 13. Acute renal failure   Plan/Discussion:    Brisk diuresis noted over night. Weight down 10 pounds. Cr up slightly  Will decrease Lasix drip to 20 mg per hour and Metolazone 5 mg daily.  Supplement K.   Given his size it is hard to tell how much fluid he has on board. Would benefit from PICC for CVP to assit with volume management however he adamantly refuses. Will watch renal function closely.  Ultrasound of LLE pending.   Consult CM for disposition and Dietitian.   Mobilize today. Consult PT.   Over weekend would continue to diurese and watch renal function carefully.  Length of Stay: 2 Torion Hulgan,MD 10:21 AM

## 2012-06-25 NOTE — Progress Notes (Signed)
PT now has home unit in room. He is able to put self on with RN or RT bleeding in O2. RT will monitor pt.

## 2012-06-25 NOTE — Progress Notes (Signed)
Patient is refusing PICC Line placement; MD aware. Will continue to monitor patient.  Lorretta Harp RN

## 2012-06-25 NOTE — Progress Notes (Signed)
Inpatient Diabetes Program Recommendations  AACE/ADA: New Consensus Statement on Inpatient Glycemic Control (2013)  Target Ranges:  Prepandial:   less than 140 mg/dL      Peak postprandial:   less than 180 mg/dL (1-2 hours)      Critically ill patients:  140 - 180 mg/dL   Results for [name], [name] (MRN [id number]) as of 06/25/2012 14:02  Ref. Range 06/24/2012 06:26 06/24/2012 11:44 06/24/2012 16:10 06/24/2012 21:06 06/25/2012 05:50 06/25/2012 11:12  Glucose-Capillary Latest Range: 70-99 mg/dL 409 (H) 811 (H) 914 (H) 165 (H) 135 (H) 225 (H)    Inpatient Diabetes Program Recommendations Insulin - Meal Coverage: Please consider adding meal coverage.  Recommend Novolog 3 untis TID with meals.  Note: Blood glucose over the past 28 hours has ranged from 135-225 mg/dl.  Please consider adding Novolog meal coverage.  Recommend Novolog 3 units TID with meals if patient is eating at least 50% of his meals.  Will continue to follow.  Thanks, Orlando Penner, RN, BSN, CCRN Diabetes Coordinator Inpatient Diabetes Program [phone]

## 2012-06-25 NOTE — Evaluation (Signed)
Physical Therapy Evaluation Patient Details Name: [name] MRN: [id number] DOB: 1931-12-03 Today's Date: 06/25/2012 Time: 4098-1191 PT Time Calculation (min): 28 min  PT Assessment / Plan / Recommendation Clinical Impression  79 yo male with multiple medical issues including morbid obesity is admitted to [facility] with LE edema He is hopeful that with continued diuresis, he will improve in functional mobility and be able to return to home with HHPT    PT Assessment  Patient needs continued PT services    Follow Up Recommendations  Home health PT    Does the patient have the potential to tolerate intense rehabilitation      Barriers to Discharge        Equipment Recommendations       Recommendations for Other Services OT consult   Frequency Min 3X/week    Precautions / Restrictions Precautions Precautions: Fall Restrictions Weight Bearing Restrictions: No   Pertinent Vitals/Pain Pt c/o pain in LLE with mobility      Mobility  Bed Mobility Bed Mobility: Supine to Sit;Sit to Supine Supine to Sit: 4: Min assist Sit to Supine: 4: Min assist Details for Bed Mobility Assistance: pt struggles to move body and he needs assist to move LLE up onto and off of bed Transfers Transfers: Sit to Stand;Stand to Sit Sit to Stand: 4: Min assist Stand to Sit: 4: Min assist Details for Transfer Assistance: pt uses bedrail an walker handle as wide tripod and keep trunk very forward flexed, but he is able to push up with arms and stand  Ambulation/Gait Ambulation/Gait Assistance: Not tested (comment) Stairs: No Wheelchair Mobility Wheelchair Mobility: No    Exercises General Exercises - Lower Extremity Ankle Circles/Pumps: AROM;Both;5 reps;Supine   PT Diagnosis: Difficulty walking;Generalized weakness  PT Problem List: Decreased activity tolerance;Decreased mobility;Decreased skin integrity PT Treatment Interventions: DME instruction;Gait training;Functional mobility  training;Therapeutic exercise   PT Goals Acute Rehab PT Goals PT Goal Formulation: With patient/family Time For Goal Achievement: 07/09/12 Potential to Achieve Goals: Good Pt will go Supine/Side to Sit: with modified independence PT Goal: Supine/Side to Sit - Progress: Goal set today Pt will go Sit to Supine/Side: with modified independence PT Goal: Sit to Supine/Side - Progress: Goal set today Pt will go Sit to Stand: with modified independence PT Goal: Sit to Stand - Progress: Goal set today Pt will go Stand to Sit: with modified independence PT Goal: Stand to Sit - Progress: Goal set today Pt will Ambulate: 16 - 50 feet;with modified independence;with least restrictive assistive device PT Goal: Ambulate - Progress: Goal set today  Visit Information  Last PT Received On: 06/25/12 Assistance Needed: +1    Subjective Data  Subjective: I need to get some of this fluid off of me Patient Stated Goal: to go back home with HHPT   Prior Functioning  Home Living Lives With: Spouse Available Help at Discharge: Family Type of Home: House Home Access: Ramped entrance (ramp to be built today) Home Layout: One level Home Adaptive Equipment: Walker - rolling;Walker - standard;Wheelchair - manual Additional Comments: pt would like to get a motorized scooter Prior Function Level of Independence: Independent with assistive device(s) Communication Communication: No difficulties    Cognition  Cognition Overall Cognitive Status: Appears within functional limits for tasks assessed/performed Arousal/Alertness: Awake/alert Orientation Level: Appears intact for tasks assessed Behavior During Session: [facility] for tasks performed    Extremity/Trunk Assessment Right Lower Extremity Assessment RLE ROM/Strength/Tone: Deficits RLE ROM/Strength/Tone Deficits: Edema in RLE, but not as severe as LLE.  Hemosiderin changes Left Lower Extremity Assessment LLE ROM/Strength/Tone: Deficits LLE  ROM/Strength/Tone Deficits: severe edema with brawmy trophic changes to skin and nail.  Ereythema present.  Pt is able to activly dorsi and plantar flex at ankle and move toes Trunk Assessment Trunk Assessment: Other exceptions Trunk Exceptions: Obese.  Pt has widespread area of hyperkeratosisi   Balance Balance Balance Assessed: Yes Static Sitting Balance Static Sitting - Balance Support: Feet supported;Left upper extremity supported Static Sitting - Level of Assistance: 6: Modified independent (Device/Increase time) Static Standing Balance Static Standing - Balance Support: Bilateral upper extremity supported Static Standing - Level of Assistance: 6: Modified independent (Device/Increase time) Static Standing - Comment/# of Minutes: pt maintains flexed position in standing  End of Session PT - End of Session Activity Tolerance: Patient tolerated treatment well Nurse Communication: Other (comment) (pt needs bariatric walker, chair and BSC)  GP    Rosey Bath K. Cutler, [place] [phone] 06/25/2012, 12:53 PM

## 2012-06-25 NOTE — Progress Notes (Signed)
*  Preliminary Results* Left lower extremity venous duplex completed. Study was technically limited due to patient body habitus and edema. Visualized veins of the left lower extremity are negative for deep vein thrombosis. Unable to visualize the veins of the left calf due to severe edema and patient body habitus. No evidence of left Baker's cyst. Preliminary results discussed with Denequal, RN.  06/25/2012 4:16 PM Gertie Fey, RDMS, RDCS

## 2012-06-26 LAB — BASIC METABOLIC PANEL
BUN: 29 mg/dL — ABNORMAL HIGH (ref 6–23)
Calcium: 9.5 mg/dL (ref 8.4–10.5)
Chloride: 97 mEq/L (ref 96–112)
Creatinine, Ser: 1.53 mg/dL — ABNORMAL HIGH (ref 0.50–1.35)
GFR calc Af Amer: 48 mL/min — ABNORMAL LOW (ref >90)
GFR calc non Af Amer: 42 mL/min — ABNORMAL LOW (ref >90)

## 2012-06-26 LAB — GLUCOSE, CAPILLARY: Glucose-Capillary: 140 mg/dL — ABNORMAL HIGH (ref 70–99)

## 2012-06-26 NOTE — Progress Notes (Signed)
Patient using home CPAP.  3L oxygen bled in.  Patient tolerating well.  RT will continue to monitor.

## 2012-06-26 NOTE — Progress Notes (Signed)
Patient ID: [name], male   DOB: [birth year], 79 y.o.   MRN: [id number] Subjective:  Dyspnea improved. No chest pain Objective:  Vital Signs in the last 24 hours: Temp:  [97.8 F (36.6 C)-98.8 F (37.1 C)] 97.8 F (36.6 C) (03/01 0536) Pulse Rate:  [65-74] 65 (03/01 0536) Resp:  [19-22] 22 (03/01 0536) BP: (115-133)/(63-66) 128/64 mmHg (03/01 0536) SpO2:  [91 %-96 %] 94 % (03/01 0536) Weight:  [382 lb 4.4 oz (173.4 kg)] 382 lb 4.4 oz (173.4 kg) (03/01 0536)  Intake/Output from previous day: 02/28 0701 - 03/01 0700 In: 1838 [P.O.:920; I.V.:918] Out: 7016 [Urine:7015; Stool:1] Intake/Output from this shift:    Physical Exam: mrbidly obese appearing NAD HEENT: Unremarkable Neck:  9 cm JVD, no thyromegally Lungs: scattered rales, distant lung sounds HEART:  Regular rate rhythm, no murmurs, no rubs, no clicks, distant heart sounds Abd:  obese, positive bowel sounds, no organomegally, no rebound, no guarding Ext:  2 plus pulses, 4+ edema, no cyanosis, no clubbing Skin:  No rashes no nodules Neuro:  CN II through XII intact, motor grossly intact  Lab Results:  Recent Labs  06/23/12 1530  WBC 4.2  HGB 10.5*  PLT 94*    Recent Labs  06/25/12 0640 06/26/12 0445  NA 140 142  K 3.4* 3.4*  CL 98 97  CO2 30 35*  GLUCOSE 142* 147*  BUN 23 29*  CREATININE 1.37* 1.53*   No results found for this basename: TROPONINI, CK, MB,  in the last 72 hours Hepatic Function Panel  Recent Labs  06/23/12 1530  PROT 6.5  ALBUMIN 3.2*  AST 27  ALT 13  ALKPHOS 59  BILITOT 0.6   No results found for this basename: CHOL,  in the last 72 hours No results found for this basename: PROTIME,  in the last 72 hours  Imaging: No results found.  Cardiac Studies: Tele - NSR Assessment/Plan:  1. Acute on chronic diastolic CHF 2. Massive obesity 3. HTN Rec: He has started to diurese. Will continue lasix drip and metolazone. Replete potassium. He and his family will need  CHF/dietary education.  Cristina Ceniceros,M.D.  LOS: 3 days    [name] 06/26/2012, 8:06 AM

## 2012-06-27 LAB — GLUCOSE, CAPILLARY
Glucose-Capillary: 156 mg/dL — ABNORMAL HIGH (ref 70–99)
Glucose-Capillary: 271 mg/dL — ABNORMAL HIGH (ref 70–99)
Glucose-Capillary: 222 mg/dL — ABNORMAL HIGH (ref 70–99)

## 2012-06-27 LAB — BASIC METABOLIC PANEL
BUN: 37 mg/dL — ABNORMAL HIGH (ref 6–23)
Chloride: 92 mEq/L — ABNORMAL LOW (ref 96–112)
GFR calc Af Amer: 44 mL/min — ABNORMAL LOW (ref >90)
Potassium: 2.8 mEq/L — ABNORMAL LOW (ref 3.5–5.1)

## 2012-06-27 NOTE — Progress Notes (Signed)
   Diuresing well. Creatinine continues to climb. Will cut lasix down to 15/hr.   Ragnar Waas,MD 11:42 AM

## 2012-06-27 NOTE — Progress Notes (Signed)
Patient ID: [name], male   DOB: Sep 30, 1931, 80 y.o.   MRN: [id number] Subjective:  Dyspnea improved  Objective:  Vital Signs in the last 24 hours: Temp:  [98.3 F (36.8 C)-99.6 F (37.6 C)] 98.3 F (36.8 C) (03/02 2130) Pulse Rate:  [66-76] 66 (03/02 0613) Resp:  [16-20] 18 (03/02 0613) BP: (117-139)/(48-72) 122/51 mmHg (03/02 0613) SpO2:  [90 %-96 %] 90 % (03/02 0613) Weight:  [377 lb 3.3 oz (171.1 kg)] 377 lb 3.3 oz (171.1 kg) (03/02 8657)  Intake/Output from previous day: 03/01 0701 - 03/02 0700 In: 1420 [P.O.:980; I.V.:440] Out: 5550 [Urine:5550] Intake/Output from this shift:    Physical Exam: Obese, chronically ill appearing with CPAP mask in place, NAD HEENT: Unremarkable Neck:  9 cm JVD, no thyromegally Lungs:  Clear with no wheezes HEART:  Regular rate rhythm, no murmurs, no rubs, no clicks Abd:  obese, positive bowel sounds, no organomegally, no rebound, no guarding Ext:  2 plus pulses, no edema, no cyanosis, no clubbing Skin:  No rashes no nodules Neuro:  CN II through XII intact, motor grossly intact  Lab Results: No results found for this basename: WBC, HGB, PLT,  in the last 72 hours  Recent Labs  06/25/12 0640 06/26/12 0445  NA 140 142  K 3.4* 3.4*  CL 98 97  CO2 30 35*  GLUCOSE 142* 147*  BUN 23 29*  CREATININE 1.37* 1.53*   No results found for this basename: TROPONINI, CK, MB,  in the last 72 hours Hepatic Function Panel No results found for this basename: PROT, ALBUMIN, AST, ALT, ALKPHOS, BILITOT, BILIDIR, IBILI,  in the last 72 hours No results found for this basename: CHOL,  in the last 72 hours No results found for this basename: PROTIME,  in the last 72 hours  Imaging: No results found.  Cardiac Studies: Tele - nsr Assessment/Plan:  1. Acute on chronic diastolic heart failure 2. Massive obesity 3. Sleep apnea 4. HTN Rec: he appears to have had a 30 lb diuresis so far and still has significant lower extremity edema. Will  continue aggressive diuresis watching renal function. Replete electrolytes as needed.  LOS: 4 days    Gregg Taylor,M.D. 06/27/2012, 8:43 AM

## 2012-06-27 NOTE — Progress Notes (Signed)
RT placed patient on cpap at home settings with 2lpm 02 bleed in. Patient is tolerating cpap well at this time.

## 2012-06-28 DIAGNOSIS — N179 Acute kidney failure, unspecified: Secondary | ICD-10-CM

## 2012-06-28 LAB — GLUCOSE, CAPILLARY
Glucose-Capillary: 146 mg/dL — ABNORMAL HIGH (ref 70–99)
Glucose-Capillary: 212 mg/dL — ABNORMAL HIGH (ref 70–99)
Glucose-Capillary: 198 mg/dL — ABNORMAL HIGH (ref 70–99)

## 2012-06-28 LAB — BASIC METABOLIC PANEL
Calcium: 9.6 mg/dL (ref 8.4–10.5)
Chloride: 91 mEq/L — ABNORMAL LOW (ref 96–112)
Creatinine, Ser: 1.79 mg/dL — ABNORMAL HIGH (ref 0.50–1.35)
GFR calc Af Amer: 40 mL/min — ABNORMAL LOW (ref >90)

## 2012-06-28 MED ORDER — POTASSIUM CHLORIDE CRYS ER 20 MEQ PO TBCR
40.0000 meq | EXTENDED_RELEASE_TABLET | Freq: Two times a day (BID) | ORAL | Status: DC
  Administered 2012-06-28 – 2012-06-29 (×3): 40 meq via ORAL
  Filled 2012-06-28 (×2): qty 2

## 2012-06-28 MED ORDER — POTASSIUM CHLORIDE CRYS ER 20 MEQ PO TBCR: 40.0000 meq | EXTENDED_RELEASE_TABLET | Freq: Three times a day (TID) | ORAL | Status: DC

## 2012-06-28 MED ORDER — POTASSIUM CHLORIDE CRYS ER 20 MEQ PO TBCR
40.0000 meq | EXTENDED_RELEASE_TABLET | Freq: Once | ORAL | Status: AC
  Administered 2012-06-28: 40 meq via ORAL

## 2012-06-28 MED ORDER — POTASSIUM CHLORIDE CRYS ER 20 MEQ PO TBCR
EXTENDED_RELEASE_TABLET | ORAL | Status: AC
  Filled 2012-06-28: qty 2

## 2012-06-28 NOTE — Progress Notes (Signed)
Pt's potassium is 2.8, Dr. Brion Aliment notified and ordered 40 mEq potassium chloride p.o once. Order carried out.

## 2012-06-28 NOTE — Progress Notes (Signed)
Rehab Admissions Coordinator Note:  Patient was screened by Meryl Dare for appropriateness for an Inpatient Acute Rehab Consult.  At this time, we are recommending Inpatient Rehab consult.  Meryl Dare 06/28/2012, 4:17 PM  I can be reached at [phone].
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
## 2012-06-28 NOTE — Progress Notes (Addendum)
Advanced Heart Failure Rounding Note   Subjective:    [name] is a 43 year with a PMH of morbid obesity, chronic diastolic heart failure EF 50% (previous EF 60-65%), HTN, SVT/PSVT/PAT, CAD, DMII, OSA, arthritis, fiber myalgia, and hyperlipidemia.   [name] says that he has gained 100 pounds over the last year. Progressive dyspnea and edema over the last month. Over the last few months he has transitioned from a cane, to a walker, and now uses wheelchair. Uses CPAP at night. He also reports drinking sports drinks suggested by his pharmacist which aided in weight gain. Does not weigh at home. Lives with his wife. Limited family support.   Admitted from [facility] office with increased lower extremity edema despite increase outpatient diuretics by his PCP. +orthopnea and PND Prior to admit Demadex was increased to 100 mg twice a day with poor response.   No DVT per LE dopplers.   Lasix drip cut back to 15 mg per hour and Metolazone 5 mg daily due to rise in Cr 1.19>1.37>1.53>1.65>1.79. Wt 369 pounds (down 39 pounds since admit).  Net -3L.    States breathing has improved.  Not ambulating.  Denies orthopnea/PND.  + lower extremity edema. +irritation around scrotum.   Objective:    Vital Signs:   Temp:  [97.6 F (36.4 C)-98 F (36.7 C)] 97.6 F (36.4 C) (03/03 0456) Pulse Rate:  [65-105] 65 (03/03 0456) Resp:  [18-20] 18 (03/03 0456) BP: (105-123)/(47-72) 113/47 mmHg (03/03 0456) SpO2:  [93 %-94 %] 94 % (03/03 0456) Weight:  [167.5 kg (369 lb 4.3 oz)] 167.5 kg (369 lb 4.3 oz) (03/03 0456) Last BM Date: 06/27/12  Weight change: Filed Weights   06/26/12 0536 06/27/12 0613 06/28/12 0456  Weight: 173.4 kg (382 lb 4.4 oz) 171.1 kg (377 lb 3.3 oz) 167.5 kg (369 lb 4.3 oz)    Intake/Output:   Intake/Output Summary (Last 24 hours) at 06/28/12 0947 Last data filed at 06/28/12 0733  Gross per 24 hour  Intake   1020 ml  Output   4800 ml  Net  -3780 ml     Physical Exam: General:  Morbidly obese. Chronically ill appearing. No resp difficulty HEENT: normal except for blind in L eye  Neck: supple. JVP ? 10-11. Carotids 2+ bilat; no bruits. No lymphadenopathy or thryomegaly appreciated.  Cor: PMI nonpalpable. Regular rate & rhythm. No rubs, gallops or murmurs.  Lungs: clear  Abdomen: morbidly obese, soft, nontender, + distended. . No bruits or masses. Good bowel sounds.  Extremities: no cyanosis, clubbing, rash, RLL tr LLE 3+ edema severe chronic venous stasis changes.  Neuro: alert & orientedx3, cranial nerves grossly intact. moves all 4 extremities w/o difficulty. Affect pleasant   Telemetry: SR  Labs: Basic Metabolic Panel:  Recent Labs Lab 06/24/12 0612 06/25/12 0640 06/26/12 0445 06/27/12 0906 06/28/12 0143  NA 140 140 142 140 138  K 3.2* 3.4* 3.4* 2.8* 2.8*  CL 102 98 97 92* 91*  CO2 28 30 35* 38* 35*  GLUCOSE 147* 142* 147* 151* 193*  BUN 20 23 29* 37* 44*  CREATININE 1.19 1.37* 1.53* 1.65* 1.79*  CALCIUM 8.9 9.4 9.5 10.2 9.6    Liver Function Tests:  Recent Labs Lab 06/23/12 1530  AST 27  ALT 13  ALKPHOS 59  BILITOT 0.6  PROT 6.5  ALBUMIN 3.2*   No results found for this basename: LIPASE, AMYLASE,  in the last 168 hours No results found for this basename: AMMONIA,  in the  last 168 hours  CBC:  Recent Labs Lab 06/23/12 1530  WBC 4.2  NEUTROABS 2.2  HGB 10.5*  HCT 30.3*  MCV 95.3  PLT 94*     Medications:     Scheduled Medications: . aspirin  162 mg Oral Daily  . cholecalciferol  5,000 Units Oral Daily  . fluticasone  2 spray Each Nare Daily  . gabapentin  300 mg Oral QHS  . heparin  5,000 Units Subcutaneous Q8H  . insulin aspart  0-20 Units Subcutaneous TID WC  . insulin glargine  40 Units Subcutaneous BID  . labetalol  200 mg Oral BID  . latanoprost  1 drop Right Eye QHS  . loratadine  10 mg Oral QPM  . metolazone  5 mg Oral Daily  . montelukast  10 mg Oral QHS  . multivitamin with minerals  1 tablet Oral Daily   . potassium chloride SA      . potassium chloride  40 mEq Oral TID  . simvastatin  40 mg Oral QHS  . sodium chloride  3 mL Intravenous Q12H  . tamsulosin  0.4 mg Oral QHS    Infusions: . furosemide (LASIX) infusion 15 mg/hr (06/27/12 1736)    PRN Medications: sodium chloride, acetaminophen, HYDROcodone-acetaminophen, lidocaine, ondansetron (ZOFRAN) IV, sodium chloride   Assessment:   1. Acute Diastolic Heart Failure  2. Morbid Obesity  3. OSA  4. HTN  5. Arthritis  6. DMII  7. CAD  8. Fiber Myalgia  9. L eye blind  10. Severe deconditioning  11. Hypokalemia  12. Hearing loss- over the last year 13. Acute renal failure  Plan/Discussion:    He has diuresed 39 pounds since admit.  Creatinine continues to climb.  Very hard to objectively assess volume status. Will hold lasix and metolazone today and see what renal function does. Refuses PICC line to assist with volume management. Suspect LLE has severe venous stasis or lymphedema Wrap legs.   Need to mobilize. Supp K+.   Continue to mobilize today, awaiting OT consult.      Length of Stay: 5  Naama Sappington,MD 9:49 AM

## 2012-06-28 NOTE — Progress Notes (Signed)
Inpatient Diabetes Program Recommendations  AACE/ADA: New Consensus Statement on Inpatient Glycemic Control (2013)  Target Ranges:  Prepandial:   less than 140 mg/dL      Peak postprandial:   less than 180 mg/dL (1-2 hours)      Critically ill patients:  140 - 180 mg/dL   Reason for Visit: Results for MOXON, MESSLER (MRN [id number]) as of 06/28/2012 11:43  Ref. Range 06/27/2012 16:21 06/27/2012 21:30 06/28/2012 06:04 06/28/2012 11:10  Glucose-Capillary Latest Range: 70-99 mg/dL 409 (H) 811 (H) 914 (H) 212 (H)   Please consider adding Novolog meal coverage 4 units tid with meals-Hold if patient eats less than 50%.
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
## 2012-06-29 ENCOUNTER — Inpatient Hospital Stay (HOSPITAL_COMMUNITY)
Admission: RE | Admit: 2012-06-29 | Discharge: 2012-07-09 | DRG: 945 | Disposition: A | Discharge status: Home Health | Payer: Medicare Other | Source: Intra-hospital | Attending: Physical Medicine & Rehabilitation | Admitting: Physical Medicine & Rehabilitation

## 2012-06-29 DIAGNOSIS — E876 Hypokalemia: Secondary | ICD-10-CM

## 2012-06-29 DIAGNOSIS — I872 Venous insufficiency (chronic) (peripheral): Secondary | ICD-10-CM

## 2012-06-29 DIAGNOSIS — I509 Heart failure, unspecified: Secondary | ICD-10-CM

## 2012-06-29 DIAGNOSIS — M1710 Unilateral primary osteoarthritis, unspecified knee: Secondary | ICD-10-CM

## 2012-06-29 DIAGNOSIS — E1142 Type 2 diabetes mellitus with diabetic polyneuropathy: Secondary | ICD-10-CM

## 2012-06-29 DIAGNOSIS — I2510 Atherosclerotic heart disease of native coronary artery without angina pectoris: Secondary | ICD-10-CM

## 2012-06-29 DIAGNOSIS — R5381 Other malaise: Secondary | ICD-10-CM

## 2012-06-29 DIAGNOSIS — Z8249 Family history of ischemic heart disease and other diseases of the circulatory system: Secondary | ICD-10-CM

## 2012-06-29 DIAGNOSIS — I10 Essential (primary) hypertension: Secondary | ICD-10-CM

## 2012-06-29 DIAGNOSIS — Z7982 Long term (current) use of aspirin: Secondary | ICD-10-CM

## 2012-06-29 DIAGNOSIS — N179 Acute kidney failure, unspecified: Secondary | ICD-10-CM

## 2012-06-29 DIAGNOSIS — Z794 Long term (current) use of insulin: Secondary | ICD-10-CM

## 2012-06-29 DIAGNOSIS — G4733 Obstructive sleep apnea (adult) (pediatric): Secondary | ICD-10-CM

## 2012-06-29 DIAGNOSIS — E669 Obesity, unspecified: Secondary | ICD-10-CM

## 2012-06-29 DIAGNOSIS — Z6841 Body Mass Index (BMI) 40.0 and over, adult: Secondary | ICD-10-CM

## 2012-06-29 DIAGNOSIS — I471 Supraventricular tachycardia, unspecified: Secondary | ICD-10-CM

## 2012-06-29 DIAGNOSIS — E785 Hyperlipidemia, unspecified: Secondary | ICD-10-CM

## 2012-06-29 DIAGNOSIS — H5440 Blindness, one eye, unspecified eye: Secondary | ICD-10-CM

## 2012-06-29 DIAGNOSIS — I5033 Acute on chronic diastolic (congestive) heart failure: Secondary | ICD-10-CM | POA: Diagnosis not present

## 2012-06-29 DIAGNOSIS — R339 Retention of urine, unspecified: Secondary | ICD-10-CM

## 2012-06-29 DIAGNOSIS — Z5189 Encounter for other specified aftercare: Principal | ICD-10-CM

## 2012-06-29 DIAGNOSIS — E1149 Type 2 diabetes mellitus with other diabetic neurological complication: Secondary | ICD-10-CM

## 2012-06-29 DIAGNOSIS — Z79899 Other long term (current) drug therapy: Secondary | ICD-10-CM

## 2012-06-29 LAB — GLUCOSE, CAPILLARY: Glucose-Capillary: 140 mg/dL — ABNORMAL HIGH (ref 70–99)

## 2012-06-29 LAB — BASIC METABOLIC PANEL
CO2: 34 mEq/L — ABNORMAL HIGH (ref 19–32)
Calcium: 9.4 mg/dL (ref 8.4–10.5)
Creatinine, Ser: 1.89 mg/dL — ABNORMAL HIGH (ref 0.50–1.35)

## 2012-06-29 LAB — CREATININE, SERUM
GFR calc Af Amer: 33 mL/min — ABNORMAL LOW (ref >90)
GFR calc non Af Amer: 28 mL/min — ABNORMAL LOW (ref >90)

## 2012-06-29 LAB — CBC
HCT: 34.7 % — ABNORMAL LOW (ref 39.0–52.0)
Hemoglobin: 12.1 g/dL — ABNORMAL LOW (ref 13.0–17.0)
RDW: 14.6 % (ref 11.5–15.5)
WBC: 5.9 10*3/uL (ref 4.0–10.5)

## 2012-06-29 MED ORDER — SORBITOL 70 % SOLN
30.0000 mL | Freq: Every day | Status: DC | PRN
  Filled 2012-06-29: qty 30

## 2012-06-29 MED ORDER — FLUTICASONE PROPIONATE 50 MCG/ACT NA SUSP
2.0000 | Freq: Every day | NASAL | Status: DC
  Administered 2012-06-30 – 2012-07-09 (×10): 2 via NASAL
  Filled 2012-06-29 (×2): qty 16

## 2012-06-29 MED ORDER — ONDANSETRON HCL 4 MG/2ML IJ SOLN: 4.0000 mg | Freq: Four times a day (QID) | INTRAMUSCULAR | Status: DC | PRN

## 2012-06-29 MED ORDER — HEPARIN SODIUM (PORCINE) 5000 UNIT/ML IJ SOLN: 5000.0000 [IU] | Freq: Three times a day (TID) | INTRAMUSCULAR | Status: DC

## 2012-06-29 MED ORDER — INSULIN ASPART 100 UNIT/ML ~~LOC~~ SOLN
0.0000 [IU] | Freq: Three times a day (TID) | SUBCUTANEOUS | Status: DC
  Administered 2012-06-29: 7 [IU] via SUBCUTANEOUS
  Administered 2012-06-30 (×2): 3 [IU] via SUBCUTANEOUS
  Administered 2012-06-30: 7 [IU] via SUBCUTANEOUS
  Administered 2012-07-01: 4 [IU] via SUBCUTANEOUS
  Administered 2012-07-01 – 2012-07-03 (×6): 3 [IU] via SUBCUTANEOUS
  Administered 2012-07-03: 4 [IU] via SUBCUTANEOUS
  Administered 2012-07-03: 3 [IU] via SUBCUTANEOUS
  Administered 2012-07-04 (×2): 4 [IU] via SUBCUTANEOUS
  Administered 2012-07-04 – 2012-07-05 (×2): 3 [IU] via SUBCUTANEOUS
  Administered 2012-07-05: 4 [IU] via SUBCUTANEOUS
  Administered 2012-07-06 – 2012-07-08 (×5): 3 [IU] via SUBCUTANEOUS

## 2012-06-29 MED ORDER — ONDANSETRON HCL 4 MG PO TABS: 4.0000 mg | ORAL_TABLET | Freq: Four times a day (QID) | ORAL | Status: DC | PRN

## 2012-06-29 MED ORDER — HYDROCODONE-ACETAMINOPHEN 5-325 MG PO TABS
1.0000 | ORAL_TABLET | Freq: Four times a day (QID) | ORAL | Status: DC | PRN
  Administered 2012-06-30 – 2012-07-09 (×24): 1 via ORAL
  Filled 2012-06-29 (×27): qty 1

## 2012-06-29 MED ORDER — LORATADINE 10 MG PO TABS
10.0000 mg | ORAL_TABLET | Freq: Every evening | ORAL | Status: DC
  Administered 2012-06-29 – 2012-07-08 (×10): 10 mg via ORAL
  Filled 2012-06-29 (×11): qty 1

## 2012-06-29 MED ORDER — TAMSULOSIN HCL 0.4 MG PO CAPS
0.4000 mg | ORAL_CAPSULE | Freq: Every day | ORAL | Status: DC
  Administered 2012-06-29 – 2012-07-08 (×10): 0.4 mg via ORAL
  Filled 2012-06-29 (×11): qty 1

## 2012-06-29 MED ORDER — VITAMIN D3 25 MCG (1000 UNIT) PO TABS
5000.0000 [IU] | ORAL_TABLET | Freq: Every day | ORAL | Status: DC
  Administered 2012-06-30 – 2012-07-09 (×10): 5000 [IU] via ORAL
  Filled 2012-06-29 (×11): qty 5

## 2012-06-29 MED ORDER — ADULT MULTIVITAMIN W/MINERALS CH
1.0000 | ORAL_TABLET | Freq: Every day | ORAL | Status: DC
  Administered 2012-06-30 – 2012-07-09 (×10): 1 via ORAL
  Filled 2012-06-29 (×11): qty 1

## 2012-06-29 MED ORDER — ACETAMINOPHEN 325 MG PO TABS
325.0000 mg | ORAL_TABLET | ORAL | Status: DC | PRN
  Administered 2012-06-30: 650 mg via ORAL
  Filled 2012-06-29: qty 2

## 2012-06-29 MED ORDER — GABAPENTIN 300 MG PO CAPS
300.0000 mg | ORAL_CAPSULE | Freq: Every day | ORAL | Status: DC
  Administered 2012-06-29 – 2012-07-08 (×10): 300 mg via ORAL
  Filled 2012-06-29 (×12): qty 1

## 2012-06-29 MED ORDER — SIMVASTATIN 40 MG PO TABS
40.0000 mg | ORAL_TABLET | Freq: Every day | ORAL | Status: DC
  Administered 2012-06-29 – 2012-07-08 (×10): 40 mg via ORAL
  Filled 2012-06-29 (×11): qty 1

## 2012-06-29 MED ORDER — LIDOCAINE 5 % EX PTCH
1.0000 | MEDICATED_PATCH | Freq: Every day | CUTANEOUS | Status: DC | PRN
  Administered 2012-07-02 – 2012-07-08 (×6): 1 via TRANSDERMAL
  Filled 2012-06-29 (×6): qty 1

## 2012-06-29 MED ORDER — MONTELUKAST SODIUM 10 MG PO TABS
10.0000 mg | ORAL_TABLET | Freq: Every day | ORAL | Status: DC
  Administered 2012-06-29 – 2012-07-08 (×10): 10 mg via ORAL
  Filled 2012-06-29 (×11): qty 1

## 2012-06-29 MED ORDER — ASPIRIN 81 MG PO CHEW
162.0000 mg | CHEWABLE_TABLET | Freq: Every day | ORAL | Status: DC
  Administered 2012-06-30 – 2012-07-09 (×10): 162 mg via ORAL
  Filled 2012-06-29 (×10): qty 2

## 2012-06-29 MED ORDER — HEPARIN SODIUM (PORCINE) 5000 UNIT/ML IJ SOLN
5000.0000 [IU] | Freq: Three times a day (TID) | INTRAMUSCULAR | Status: DC
  Administered 2012-06-29 – 2012-07-09 (×28): 5000 [IU] via SUBCUTANEOUS
  Filled 2012-06-29 (×32): qty 1

## 2012-06-29 MED ORDER — LATANOPROST 0.005 % OP SOLN
1.0000 [drp] | Freq: Every day | OPHTHALMIC | Status: DC
  Administered 2012-06-29 – 2012-07-08 (×10): 1 [drp] via OPHTHALMIC
  Filled 2012-06-29 (×2): qty 2.5

## 2012-06-29 MED ORDER — INSULIN GLARGINE 100 UNIT/ML ~~LOC~~ SOLN
40.0000 [IU] | Freq: Two times a day (BID) | SUBCUTANEOUS | Status: DC
  Administered 2012-06-29: 40 [IU] via SUBCUTANEOUS

## 2012-06-29 MED ORDER — POTASSIUM CHLORIDE CRYS ER 20 MEQ PO TBCR
40.0000 meq | EXTENDED_RELEASE_TABLET | Freq: Two times a day (BID) | ORAL | Status: DC
  Administered 2012-06-29 – 2012-07-09 (×20): 40 meq via ORAL
  Filled 2012-06-29 (×23): qty 2

## 2012-06-29 MED ORDER — LABETALOL HCL 200 MG PO TABS
200.0000 mg | ORAL_TABLET | Freq: Two times a day (BID) | ORAL | Status: DC
  Administered 2012-06-29 – 2012-07-09 (×19): 200 mg via ORAL
  Filled 2012-06-29 (×22): qty 1
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
## 2012-06-29 NOTE — Progress Notes (Signed)
Met with patient and his wife at bedside to discuss possible admission to inpatient rehab. Pt would benefit from CIR prior to returning home. Pt and wife are in agreement with plan.  Await notification from pt's MD re: if pt is medically stable and ready for d/c to CIR today. Please call for questions: [phone]

## 2012-06-29 NOTE — Discharge Summary (Signed)
Advanced Heart Failure Team  Discharge Summary   Patient ID: [name] MRN: [id number], DOB/AGE: 1932/02/01 79 y.o. Admit date: 06/23/2012 D/C date:     06/29/2012   Primary Discharge Diagnoses:  1. Acute Diastolic Heart Failure  2. Morbid Obesity  3. Acute renal failure 4. Hypokalemia  Secondary Discharge Diagnoses:  1. OSA on CPAP 2. HTN 3. Arthritis 4. CAD 5. DM type 2 6. Severe deconditioning  7. Hearing loss 8. L eye blindness  Hospital Course:  Mr Lovelady is a 70 year with a PMH of morbid obesity, chronic diastolic heart failure EF 50% (previous EF 60-65%), HTN, SVT/PSVT/PAT, CAD, DMII, OSA on CPAP nightly, arthritis, fibromyalgia, and hyperlipidemia.  He was admitted from Dr. Ival Bible office for increased lower extremity edema and progressive dyspnea that had failed increase (torsemide 100 mg BID) in outpatient therapy.  Pertinent labs on admit:Cr 1.19, K 3.2, Hgb 10.5.  CXR showed cardiomegaly with possible interstitial edema.  He was started on lasix 15 mg/hr with minimal urine output.  Lasix drip increased to 30 mg/hr and metolazone 5 mg BID added due to sluggish diuresis.  Diuresis picked up with increase in lasix drip but over the weekend his creatinine began to climb and lasix drip cut back to 15 mg/hr.  Creatinine climbed from 1.19 to 1.79 and lasix/metolazone stopped.  He diuresed 37 pounds with discharge weight 371 pounds.  He will be discharged to inpatient rehab and the HF team will continue to follow.  Am hopeful to restart torsemide in 24-48 hours once kidney function recovers.    Of note, LLE with severe venous stasis vs lymphedema.  Obtained Lower extremity dopplers to rule out clot and it was negative for DVT on 2/28.    Extensive HF education and home scale provided to patient.   Discharge Weight Range: 370-372 pounds Discharge Vitals: Blood pressure 122/47, pulse 66, temperature 98.3 F (36.8 C), temperature source Oral, resp. rate 20, height 6' (1.829 m),  weight 168.693 kg (371 lb 14.4 oz), SpO2 91.00%.  Labs: Lab Results  Component Value Date   WBC 5.9 06/29/2012   HGB 12.1* 06/29/2012   HCT 34.7* 06/29/2012   MCV 94.0 06/29/2012   PLT 128* 06/29/2012    Recent Labs Lab 06/23/12 1530  06/29/12 0919 06/29/12 1827  NA 144  < > 137  --   K 4.0  < > 3.1*  --   CL 105  < > 91*  --   CO2 30  < > 34*  --   BUN 22  < > 51*  --   CREATININE 1.29  < > 1.89* 2.10*  CALCIUM 9.3  < > 9.4  --   PROT 6.5  --   --   --   BILITOT 0.6  --   --   --   ALKPHOS 59  --   --   --   ALT 13  --   --   --   AST 27  --   --   --   GLUCOSE 112*  < > 235*  --   < > = values in this interval not displayed. No results found for this basename: CHOL,  HDL,  LDLCALC,  TRIG   BNP (last 3 results) No results found for this basename: PROBNP,  in the last 8760 hours  Diagnostic Studies/Procedures   No results found.  Discharge Medications     Medication List    STOP taking these medications  torsemide 100 MG tablet  Commonly known as:  DEMADEX      TAKE these medications       acetaminophen 500 MG tablet  Commonly known as:  TYLENOL  Take 1,000 mg by mouth 2 (two) times daily.     aspirin 81 MG tablet  Take 162 mg by mouth daily.     CENTRUM SILVER PO  Take 1 tablet by mouth daily.     cetirizine 10 MG tablet  Commonly known as:  ZYRTEC  Take 10 mg by mouth every evening.     D-3-5 5000 UNITS capsule  Generic drug:  Cholecalciferol  Take 5,000 Units by mouth daily.     fluticasone 50 MCG/ACT nasal spray  Commonly known as:  FLONASE  Place 2 sprays into the nose daily.     gabapentin 300 MG capsule  Commonly known as:  NEURONTIN  Take 300 mg by mouth at bedtime.     HYDROcodone-acetaminophen 5-325 MG per tablet  Commonly known as:  NORCO/VICODIN  Take 1 tablet by mouth every 6 (six) hours as needed for pain.     insulin glargine 100 UNIT/ML injection  Commonly known as:  LANTUS  Inject 40 Units into the skin 2 (two) times  daily.     labetalol 200 MG tablet  Commonly known as:  NORMODYNE  Take 200 mg by mouth 2 (two) times daily.     latanoprost 0.005 % ophthalmic solution  Commonly known as:  XALATAN  Place 1 drop into the right eye at bedtime.     lidocaine 5 %  Commonly known as:  LIDODERM  Place 1 patch onto the skin daily as needed. Remove & Discard patch within 12 hours or as directed by MD.     montelukast 10 MG tablet  Commonly known as:  SINGULAIR  Take 10 mg by mouth at bedtime.     NOVOLOG FLEXPEN [place]  Inject 15-28 Units into the skin. Sliding scale  Units giving based off of the CBG readings     potassium chloride 10 MEQ CR tablet  Commonly known as:  KLOR-CON  Take 10 mEq by mouth 2 (two) times daily.     simvastatin 40 MG tablet  Commonly known as:  ZOCOR  Take 40 mg by mouth at bedtime.     tamsulosin 0.4 MG Caps  Commonly known as:  FLOMAX  Take 0.4 mg by mouth at bedtime.        Disposition   The patient will be discharged in stable condition to home.  Future Appointments [facility] Department Dept Phone   06/30/2012 8:30 AM Coralyn Mark, OT [facility]  [address]  [place] [phone]   06/30/2012 10:00 AM Philip Aspen, PT [facility]  [address]  [place] [phone]   06/30/2012 11:30 AM Philip Aspen, PT [facility]  [address]  [place] [phone]   06/30/2012 2:00 PM Coralyn Mark, OT [facility]  [address]  [place] [phone]     Follow-up Information   Follow up with [facility] Nurse for Heart Failure Management)    Contact information:   [phone]        Duration of Discharge Encounter: Greater than 35 minutes   Signed,  Reuel Boom Bensimhon,MD 11:30 PM
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
## 2012-06-29 NOTE — Progress Notes (Signed)
Patient is being transferred to unit 4000 for rehabilitation, report given to nurse.  D.Brown RN
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
## 2012-06-29 NOTE — Progress Notes (Signed)
Noted Dr Gala Romney reports pt is stable for d/c to CIR today. Will admit to CIR today. Call for questions: [phone]

## 2012-06-29 NOTE — Interval H&P Note (Signed)
[name] was admitted today to Inpatient Rehabilitation with the diagnosis of deconditioning after CHF and multiple medical issues.  The patient's history has been reviewed, patient examined, and there is no change in status.  Patient continues to be appropriate for intensive inpatient rehabilitation.  I have reviewed the patient's chart and labs.  Questions were answered to the patient's satisfaction.  SWARTZ,ZACHARY T 06/29/2012, 8:53 PM

## 2012-06-29 NOTE — Progress Notes (Signed)
Patient's urinary catheter has been removed per protocol; will continue to monitor patient. D. Vernona Peake RN 

## 2012-06-29 NOTE — Progress Notes (Signed)
Physical Therapy Treatment Patient Details Name: [name] MRN: [id number] DOB: November 19, 1931 Today's Date: 06/29/2012 Time: 1914-7829 PT Time Calculation (min): 27 min  PT Assessment / Plan / Recommendation Comments on Treatment Session  Pt admitted with CHF and LE edema and progressing with mobility today able to transfer with increased ability and to ambulate further. Pt very pleasant and eager to get stronger to return home. Will continue to follow.     Follow Up Recommendations        Does the patient have the potential to tolerate intense rehabilitation     Barriers to Discharge        Equipment Recommendations       Recommendations for Other Services    Frequency     Plan Discharge plan remains appropriate;Frequency remains appropriate    Precautions / Restrictions Precautions Precautions: Fall Precaution Comments: obese   Pertinent Vitals/Pain No pain sats 90-92% on RA throughout HR 73-94    Mobility  Bed Mobility Bed Mobility: Rolling Left;Left Sidelying to Sit Rolling Left: 5: Supervision;With rail Left Sidelying to Sit: 5: Supervision;HOB flat;With rails Details for Bed Mobility Assistance: increased time and cueing for sequence with momentum used to get out of bed Transfers Sit to Stand: 4: Min assist;From bed Stand to Sit: 4: Min assist;To chair/3-in-1 Details for Transfer Assistance: cueing for hand placement, sequence and transfer Ambulation/Gait Ambulation/Gait Assistance: 4: Min assist Ambulation Distance (Feet): 80 Feet Assistive device: Rolling walker Ambulation/Gait Assistance Details: cueing for posture and position in RW, pt maintains flexed trunk, followed with chair Gait Pattern: Shuffle;Wide base of support;Trunk flexed Gait velocity: decreased Stairs: No    Exercises General Exercises - Lower Extremity Long Arc Quad: AROM;Both;20 reps;Seated Hip Flexion/Marching: AROM;Both;20 reps;Seated Toe Raises: AROM;Both;20 reps;Seated Heel  Raises: AROM;Both;20 reps;Seated   PT Diagnosis:    PT Problem List:   PT Treatment Interventions:     PT Goals Acute Rehab PT Goals PT Goal: Supine/Side to Sit - Progress: Progressing toward goal PT Goal: Sit to Stand - Progress: Progressing toward goal PT Goal: Stand to Sit - Progress: Progressing toward goal PT Goal: Ambulate - Progress: Progressing toward goal  Visit Information  Last PT Received On: 06/29/12 Assistance Needed: +2 (safety)    Subjective Data  Subjective: I'm doing a little better today   Cognition  Cognition Overall Cognitive Status: Appears within functional limits for tasks assessed/performed Arousal/Alertness: Awake/alert Orientation Level: Appears intact for tasks assessed Behavior During Session: [facility] for tasks performed    Balance     End of Session PT - End of Session Activity Tolerance: Patient tolerated treatment well Patient left: in chair;with call bell/phone within reach;with family/visitor present Nurse Communication: Mobility status   GP     Toney Sang Beth 06/29/2012, 1:11 PM Delaney Meigs, PT [phone]

## 2012-06-29 NOTE — Progress Notes (Signed)
Assisted pt. With putting on home CPAP with 3 lpm O2 bleed in. Added distilled water brought in by patient to chamber. Pt. Tolerating well. Pt. Wife at bedside.

## 2012-06-29 NOTE — Progress Notes (Signed)
Advanced Heart Failure Rounding Note   Subjective:    [name] is a 50 year with a PMH of morbid obesity, chronic diastolic heart failure EF 50% (previous EF 60-65%), HTN, SVT/PSVT/PAT, CAD, DMII, OSA, arthritis, fiber myalgia, and hyperlipidemia.   [name] says that he has gained 100 pounds over the last year. Progressive dyspnea and edema over the last month. Over the last few months he has transitioned from a cane, to a walker, and now uses wheelchair. Uses CPAP at night. He also reports drinking sports drinks suggested by his pharmacist which aided in weight gain. Does not weigh at home. Lives with his wife. Limited family support.   Admitted from [facility] office with increased lower extremity edema despite increase outpatient diuretics by his PCP. +orthopnea and PND Prior to admit Demadex was increased to 100 mg twice a day with poor response.   No DVT per LE dopplers.   Diuretics held yesterday with rise in Cr.  1.19>1.37>1.53>1.65>1.79>1.81. Wt 371 pounds (down 37 pounds since admit).  Net -1.4L.    States breathing has improved.  Not ambulating.  Denies orthopnea/PND.  + lower extremity edema. +irritation around scrotum.   Objective:    Vital Signs:   Temp:  [98 F (36.7 C)-98.6 F (37 C)] 98.4 F (36.9 C) (03/04 1036) Pulse Rate:  [67-74] 67 (03/04 1036) Resp:  [17-20] 18 (03/04 1036) BP: (117-138)/(45-64) 123/49 mmHg (03/04 1036) SpO2:  [91 %-94 %] 93 % (03/04 1036) Weight:  [168.693 kg (371 lb 14.4 oz)] 168.693 kg (371 lb 14.4 oz) (03/04 0546) Last BM Date: 06/28/12  Weight change: Filed Weights   06/27/12 0613 06/28/12 0456 06/29/12 0546  Weight: 171.1 kg (377 lb 3.3 oz) 167.5 kg (369 lb 4.3 oz) 168.693 kg (371 lb 14.4 oz)    Intake/Output:   Intake/Output Summary (Last 24 hours) at 06/29/12 1155 Last data filed at 06/29/12 1041  Gross per 24 hour  Intake    803 ml  Output   1675 ml  Net   -872 ml     Physical Exam: General: Morbidly obese. Sitting  in chair No resp difficulty HEENT: normal except for blind in L eye  Neck: supple. JVP hard to see Carotids 2+ bilat; no bruits. No lymphadenopathy or thryomegaly appreciated.  Cor: PMI nonpalpable. Regular rate & rhythm. No rubs, gallops or murmurs.  Lungs: clear  Abdomen: morbidly obese, soft, nontender, + distended. . No bruits or masses. Good bowel sounds.  Extremities: no cyanosis, clubbing, rash, RLL tr LLE 3+ edema severe chronic venous stasis changes.  Neuro: alert & orientedx3, cranial nerves grossly intact. moves all 4 extremities w/o difficulty. Affect pleasant   Telemetry: SR  Labs: Basic Metabolic Panel:  Recent Labs Lab 06/25/12 0640 06/26/12 0445 06/27/12 0906 06/28/12 0143 06/29/12 0919  NA 140 142 140 138 137  K 3.4* 3.4* 2.8* 2.8* 3.1*  CL 98 97 92* 91* 91*  CO2 30 35* 38* 35* 34*  GLUCOSE 142* 147* 151* 193* 235*  BUN 23 29* 37* 44* 51*  CREATININE 1.37* 1.53* 1.65* 1.79* 1.89*  CALCIUM 9.4 9.5 10.2 9.6 9.4    Liver Function Tests:  Recent Labs Lab 06/23/12 1530  AST 27  ALT 13  ALKPHOS 59  BILITOT 0.6  PROT 6.5  ALBUMIN 3.2*   No results found for this basename: LIPASE, AMYLASE,  in the last 168 hours No results found for this basename: AMMONIA,  in the last 168 hours  CBC:  Recent Labs  Lab 06/23/12 1530  WBC 4.2  NEUTROABS 2.2  HGB 10.5*  HCT 30.3*  MCV 95.3  PLT 94*     Medications:     Scheduled Medications: . aspirin  162 mg Oral Daily  . cholecalciferol  5,000 Units Oral Daily  . fluticasone  2 spray Each Nare Daily  . gabapentin  300 mg Oral QHS  . heparin  5,000 Units Subcutaneous Q8H  . insulin aspart  0-20 Units Subcutaneous TID WC  . insulin glargine  40 Units Subcutaneous BID  . labetalol  200 mg Oral BID  . latanoprost  1 drop Right Eye QHS  . loratadine  10 mg Oral QPM  . montelukast  10 mg Oral QHS  . multivitamin with minerals  1 tablet Oral Daily  . potassium chloride  40 mEq Oral BID  . simvastatin  40  mg Oral QHS  . sodium chloride  3 mL Intravenous Q12H  . tamsulosin  0.4 mg Oral QHS    Infusions:    PRN Medications: sodium chloride, acetaminophen, HYDROcodone-acetaminophen, lidocaine, ondansetron (ZOFRAN) IV, sodium chloride   Assessment:   1. Acute Diastolic Heart Failure  2. Morbid Obesity  3. OSA  4. HTN  5. Arthritis  6. DMII  7. CAD  8. Fiber Myalgia  9. L eye blind  10. Severe deconditioning  11. Hypokalemia  12. Hearing loss- over the last year 13. Acute renal failure  Plan/Discussion:    Diuretics held yesterday due to increase in BUN/Cr.  UOP dropped off and weight increased 2 pounds.  Cr/BUN continue to climb today.  As previously states, volume status is difficult to determine due to body habitus. But he appears dry. Continue to hold diuretics. Will restart soon. Ok for [employer] today. Will likely need to have LLE wrapped if can tolerate from an arterial perspective.    HF team will follow in CIR.   Length of Stay: 6 Daniel Bensimhon,MD 11:57 AM

## 2012-06-29 NOTE — Progress Notes (Signed)
Late Entry:  Patient referred to CSW for short term CIR vs SNF.  He was evaluated and accepted by CIR. Per MD- ok for d/c to CIR today.  No CSW intervention indicated.  CSW signing off.  Lorri Frederick. [place]  [phone]
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
## 2012-06-29 NOTE — Progress Notes (Signed)
Pt transferred to Rehab from 4700. Pt and wife orientated to Rehab expectation, therapy schedule, safe plan, etc. Diagnostic specific information provided, all questions answered.

## 2012-06-30 ENCOUNTER — Encounter (HOSPITAL_COMMUNITY): Payer: Self-pay | Admitting: *Deleted

## 2012-06-30 ENCOUNTER — Inpatient Hospital Stay (HOSPITAL_COMMUNITY): Payer: Medicare Other

## 2012-06-30 ENCOUNTER — Inpatient Hospital Stay (HOSPITAL_COMMUNITY): Payer: Medicare Other | Admitting: Occupational Therapy

## 2012-06-30 DIAGNOSIS — I509 Heart failure, unspecified: Secondary | ICD-10-CM

## 2012-06-30 DIAGNOSIS — M1710 Unilateral primary osteoarthritis, unspecified knee: Secondary | ICD-10-CM

## 2012-06-30 DIAGNOSIS — R5381 Other malaise: Secondary | ICD-10-CM

## 2012-06-30 DIAGNOSIS — E669 Obesity, unspecified: Secondary | ICD-10-CM

## 2012-06-30 LAB — CBC WITH DIFFERENTIAL/PLATELET
Basophils Absolute: 0 10*3/uL (ref 0.0–0.1)
Eosinophils Absolute: 0.3 10*3/uL (ref 0.0–0.7)
Lymphs Abs: 1.4 10*3/uL (ref 0.7–4.0)
MCH: 32.1 pg (ref 26.0–34.0)
Neutrophils Relative %: 49 % (ref 43–77)
Platelets: 103 10*3/uL — ABNORMAL LOW (ref 150–400)
RBC: 3.4 MIL/uL — ABNORMAL LOW (ref 4.22–5.81)
RDW: 14.6 % (ref 11.5–15.5)
WBC: 4.8 10*3/uL (ref 4.0–10.5)

## 2012-06-30 LAB — GLUCOSE, CAPILLARY
Glucose-Capillary: 145 mg/dL — ABNORMAL HIGH (ref 70–99)
Glucose-Capillary: 205 mg/dL — ABNORMAL HIGH (ref 70–99)
Glucose-Capillary: 134 mg/dL — ABNORMAL HIGH (ref 70–99)
Glucose-Capillary: 148 mg/dL — ABNORMAL HIGH (ref 70–99)

## 2012-06-30 LAB — COMPREHENSIVE METABOLIC PANEL
ALT: 17 U/L (ref 0–53)
AST: 37 U/L (ref 0–37)
Albumin: 3.4 g/dL — ABNORMAL LOW (ref 3.5–5.2)
Alkaline Phosphatase: 67 U/L (ref 39–117)
Glucose, Bld: 140 mg/dL — ABNORMAL HIGH (ref 70–99)
Potassium: 3.3 mEq/L — ABNORMAL LOW (ref 3.5–5.1)
Sodium: 140 mEq/L (ref 135–145)
Total Protein: 6.5 g/dL (ref 6.0–8.3)

## 2012-06-30 MED ORDER — INSULIN GLARGINE 100 UNIT/ML ~~LOC~~ SOLN
45.0000 [IU] | Freq: Two times a day (BID) | SUBCUTANEOUS | Status: DC
  Administered 2012-06-30 – 2012-07-09 (×19): 45 [IU] via SUBCUTANEOUS

## 2012-06-30 MED ORDER — POTASSIUM CHLORIDE CRYS ER 20 MEQ PO TBCR
40.0000 meq | EXTENDED_RELEASE_TABLET | Freq: Once | ORAL | Status: AC
  Administered 2012-06-30: 40 meq via ORAL
  Filled 2012-06-30: qty 2

## 2012-06-30 NOTE — Progress Notes (Signed)
Advanced Heart Failure Rounding Note   Subjective:    [name] is a 73 year with a PMH of morbid obesity, chronic diastolic heart failure EF 50% (previous EF 60-65%), HTN, SVT/PSVT/PAT, CAD, DMII, OSA, arthritis, fibromyalgia, and hyperlipidemia.   [name] says that he has gained 100 pounds over the last year. Progressive dyspnea and edema over the last month. Over the last few months he has transitioned from a cane, to a walker, and now uses wheelchair. Uses CPAP at night. He also reports drinking sports drinks suggested by his pharmacist which aided in weight gain. Does not weigh at home. Lives with his wife. Limited family support.   Admitted from [facility] office with increased lower extremity edema despite increase outpatient diuretics by his PCP. +orthopnea and PND Prior to admit Demadex was increased to 100 mg twice a day with poor response.   No DVT per LE dopplers.   Diuretics held for 2 days due to rise in Cr.  1.19>1.37>1.53>1.65>1.79>1.81>2.1>1.83. Wt 367 pounds.  UOP not measured.    Moved CIR yesterday.  Didn't sleep well last night and felt drowsy this morning.  Denies orthopnea/PND.  No chest pain.    Objective:    Vital Signs:   Temp:  [97.6 F (36.4 C)-98.4 F (36.9 C)] 98.1 F (36.7 C) (03/05 0550) Pulse Rate:  [62-80] 62 (03/05 0550) Resp:  [17-20] 17 (03/05 0550) BP: (112-123)/(47-70) 112/63 mmHg (03/05 0550) SpO2:  [90 %-96 %] 90 % (03/05 0550) Weight:  [367 lb 15.2 oz (166.9 kg)] 367 lb 15.2 oz (166.9 kg) (03/05 0550) Last BM Date: 06/28/12  Weight change: Filed Weights   06/30/12 0500 06/30/12 0550  Weight: 367 lb 15.2 oz (166.9 kg) 367 lb 15.2 oz (166.9 kg)    Intake/Output:   Intake/Output Summary (Last 24 hours) at 06/30/12 0949 Last data filed at 06/30/12 0700  Gross per 24 hour  Intake    480 ml  Output   1100 ml  Net   -620 ml     Physical Exam: General: Morbidly obese. Sitting in chair No resp difficulty HEENT: normal except for  blind in L eye  Neck: supple. JVP hard to see Carotids 2+ bilat; no bruits. No lymphadenopathy or thryomegaly appreciated.  Cor: PMI nonpalpable. Regular rate & rhythm. No rubs, gallops or murmurs.  Lungs: clear  Abdomen: morbidly obese, soft, nontender, + distended. . No bruits or masses. Good bowel sounds.  Extremities: no cyanosis, clubbing, rash, RLL tr LLE 3+ edema severe chronic venous stasis changes.  Neuro: alert & orientedx3, cranial nerves grossly intact. moves all 4 extremities w/o difficulty. Affect pleasant   Telemetry: SR  Labs: Basic Metabolic Panel:  Recent Labs Lab 06/26/12 0445 06/27/12 0906 06/28/12 0143 06/29/12 0919 06/29/12 1827 06/30/12 0725  NA 142 140 138 137  --  140  K 3.4* 2.8* 2.8* 3.1*  --  3.3*  CL 97 92* 91* 91*  --  95*  CO2 35* 38* 35* 34*  --  33*  GLUCOSE 147* 151* 193* 235*  --  140*  BUN 29* 37* 44* 51*  --  54*  CREATININE 1.53* 1.65* 1.79* 1.89* 2.10* 1.83*  CALCIUM 9.5 10.2 9.6 9.4  --  9.3    Liver Function Tests:  Recent Labs Lab 06/23/12 1530 06/30/12 0725  AST 27 37  ALT 13 17  ALKPHOS 59 67  BILITOT 0.6 0.9  PROT 6.5 6.5  ALBUMIN 3.2* 3.4*   No results found for this basename:  LIPASE, AMYLASE,  in the last 168 hours No results found for this basename: AMMONIA,  in the last 168 hours  CBC:  Recent Labs Lab 06/23/12 1530 06/29/12 1827 06/30/12 0725  WBC 4.2 5.9 4.8  NEUTROABS 2.2  --  2.4  HGB 10.5* 12.1* 10.9*  HCT 30.3* 34.7* 31.4*  MCV 95.3 94.0 92.4  PLT 94* 128* 103*     Medications:     Scheduled Medications: . aspirin  162 mg Oral Daily  . cholecalciferol  5,000 Units Oral Daily  . fluticasone  2 spray Each Nare Daily  . gabapentin  300 mg Oral QHS  . heparin  5,000 Units Subcutaneous Q8H  . insulin aspart  0-20 Units Subcutaneous TID WC  . insulin glargine  45 Units Subcutaneous BID  . labetalol  200 mg Oral BID  . latanoprost  1 drop Right Eye QHS  . loratadine  10 mg Oral QPM  .  montelukast  10 mg Oral QHS  . multivitamin with minerals  1 tablet Oral Daily  . potassium chloride  40 mEq Oral BID  . simvastatin  40 mg Oral QHS  . tamsulosin  0.4 mg Oral QHS    Infusions:    PRN Medications: acetaminophen, HYDROcodone-acetaminophen, lidocaine, ondansetron (ZOFRAN) IV, ondansetron, sorbitol   Assessment:   1. Acute Diastolic Heart Failure  2. Morbid Obesity  3. OSA  4. HTN  5. Arthritis  6. DMII  7. CAD  8. Fibromyalgia  9. L eye blind  10. Severe deconditioning  11. Hypokalemia  12. Hearing loss- over the last year 13. Acute renal failure  Plan/Discussion:    Renal function improving slowly off diuretics.  Will continue to hold diuretics today as weight is stable.  Will recheck in the BMET in the morning.  Replete K.  Likely restart torsemide 100 mg daily tomorrow.  May need periodic metolazone.   Appreciate CIR's assistance.    Marca Ancona 06/30/2012 Length of Stay: 1
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
## 2012-06-30 NOTE — Plan of Care (Addendum)
Problem: RH SKIN INTEGRITY Goal: RH STG SKIN FREE OF INFECTION/BREAKDOWN Outcome: Not Progressing 06/30/12 Skin on body extremely dry and scaly with skin tags. Left leg 4+ edema/Right leg 3+ edema.  A. Trumaine Wimer,LPN

## 2012-06-30 NOTE — Progress Notes (Signed)
Subjective/Complaints: A little restless last night. Anxious about being in a new place and about what will happen today. Otherwise no complaints. A 12 point review of systems has been performed and if not noted above is otherwise negative.   Objective: Vital Signs: Blood pressure 112/63, pulse 62, temperature 98.1 F (36.7 C), temperature source Oral, resp. rate 17, weight 166.9 kg (367 lb 15.2 oz), SpO2 90.00%. No results found.  Recent Labs  06/29/12 1827  WBC 5.9  HGB 12.1*  HCT 34.7*  PLT 128*    Recent Labs  06/28/12 0143 06/29/12 0919 06/29/12 1827  NA 138 137  --   K 2.8* 3.1*  --   CL 91* 91*  --   GLUCOSE 193* 235*  --   BUN 44* 51*  --   CREATININE 1.79* 1.89* 2.10*  CALCIUM 9.6 9.4  --    CBG (last 3)   Recent Labs  06/29/12 1124 06/29/12 1647 06/29/12 2058  GLUCAP 183* 251* 193*    Wt Readings from Last 3 Encounters:  06/30/12 166.9 kg (367 lb 15.2 oz)  06/29/12 168.693 kg (371 lb 14.4 oz)  06/23/12 170.099 kg (375 lb)    Physical Exam:  Constitutional: He is oriented to person, place, and time. Wearing CPAP/nasal 79 year old morbidly obese white male.  Eyes:  Left eye enucleated Neck: Neck supple. No thyromegaly present.  Cardiovascular:  Cardiac rate controlled. No murmurs rubs or gallops, regular  Pulmonary/Chest:  Decreased breath sounds at the bases without wheeze. No distress  Abdominal: Soft. Bowel sounds are normal. He exhibits no distension. There is no tenderness.  Musculoskeletal:  3+ pitting edema left leg. 2+ right leg. Chronic stasis changes in either leg. Left knee limited with A and P flexion due to pain. Right knee appears less affected.  Neurological: He is alert and oriented to person, place, and time.  Follows simple commands. Moves all 4's. No focal weakness although inhibition with pain and lmitations due to severe edema in each leg. UE grossly 4/5. Very HOH  Skin: Skin is warm and dry.  Psychiatric: He has a normal  mood and affect. His behavior is normal. Judgment and thought content normal  Assessment/Plan: 1. Functional deficits secondary to deconditioning related to diastolic CHF and multiple medical issues which require 3+ hours per day of interdisciplinary therapy in a comprehensive inpatient rehab setting. Physiatrist is providing close team supervision and 24 hour management of active medical problems listed below. Physiatrist and rehab team continue to assess barriers to discharge/monitor patient progress toward functional and medical goals.  Discussed goals with patient and reassured him about today.  FIM:                   Comprehension Comprehension Mode: Auditory Comprehension: 5-Follows basic conversation/direction: With extra time/assistive device  Expression Expression Mode: Verbal Expression: 5-Expresses basic needs/ideas: With extra time/assistive device  Social Interaction Social Interaction: 4-Interacts appropriately 77 - 89% of the time - Needs redirection for appropriate language or to initiate interaction.  Problem Solving Problem Solving: 3-Solves basic 50 - 74% of the time/requires cueing 25 - 49% of the time  Memory Memory: 3-Recognizes or recalls 50 - 74% of the time/requires cueing 25 - 49% of the time  Medical Problem List and Plan:  1. Physical deconditioning related to diastolic congestive heart failure and multiple medical issues.  2. DVT Prophylaxis/Anticoagulation: Subcutaneous heparin. Monitor platelet counts any signs of bleeding.  3. Pain Management: Neurontin 300 mg each bedtime, Lidoderm patch as  needed hydrocodone 1 tablet every 6 hours as needed pain.  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  4. Neuropsych: This patient is capable of making decisions on his/her own behalf.  5. Diastolic congestive heart failure. Patient has diuresed 39 pounds thus far. Followup per cardiology services   Diuretics currently on hold  pending volume/electrolyte balance.   -daily weights, 166.9kg today 6. 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 Diabetes mellitus with peripheral neuropathy. Hemoglobin A1c 7.6. Lantus insulin 40 units twice a day. Check blood sugars a.c. and at bedtime. Titrate lantus to 45u 8. Sleep apnea. Continue CPAP  9. CAD/PSVT. Continue aspirin therapy. No chest pain or shortness of breath. Followup cardiology services LOS (Days) 1 A FACE TO FACE EVALUATION WAS PERFORMED  SWARTZ,ZACHARY T 06/30/2012 7:32 AM
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
## 2012-06-30 NOTE — Progress Notes (Signed)
Patient information reviewed and entered into eRehab system by Marie Noel, RN, CRRN, PPS Coordinator.  Information including medical coding and functional independence measure will be reviewed and updated through discharge.     Per nursing patient was given "Data Collection Information Summary for Patients in Inpatient Rehabilitation Facilities with attached "Privacy Act Statement-Health Care Records" upon admission.  

## 2012-07-01 ENCOUNTER — Inpatient Hospital Stay (HOSPITAL_COMMUNITY): Payer: Medicare Other | Admitting: Occupational Therapy

## 2012-07-01 ENCOUNTER — Inpatient Hospital Stay (HOSPITAL_COMMUNITY): Payer: Medicare Other

## 2012-07-01 DIAGNOSIS — I5031 Acute diastolic (congestive) heart failure: Secondary | ICD-10-CM

## 2012-07-01 LAB — GLUCOSE, CAPILLARY: Glucose-Capillary: 197 mg/dL — ABNORMAL HIGH (ref 70–99)

## 2012-07-01 LAB — BASIC METABOLIC PANEL
BUN: 54 mg/dL — ABNORMAL HIGH (ref 6–23)
CO2: 31 mEq/L (ref 19–32)
Calcium: 9.5 mg/dL (ref 8.4–10.5)
GFR calc non Af Amer: 34 mL/min — ABNORMAL LOW (ref >90)
Glucose, Bld: 145 mg/dL — ABNORMAL HIGH (ref 70–99)

## 2012-07-01 NOTE — Progress Notes (Signed)
Occupational Therapy Session Note  Patient Details  Name: [name] MRN: [id number] Date of Birth: 11-12-1931  Today's Date: 07/01/2012 Time: 4098-1191 Time Calculation (min): 56 min  Skilled Therapeutic Interventions/Progress Updates:    Pt worked on functional transfers in the ADL apartment using the RW.  Pt was able to perform all transfers with min guard assist and increased time for sit to stand to bed and love seat.  Pt tends to push up from the surface he is sitting on with one hand and place the other hand on the RW.  [name] ambulated from the apartment out the kitchen entrance and up the the rehab gym.  O2 sats 96% after completion of this activity with the RW.  Performed kinetron on level 3 resistance for 6 mins with an average of 38 steps per minute, to work on increasing endurance.  Pt needing 2 rest breaks to complete. Therapy Documentation Precautions:  Precautions Precautions: Fall Precaution Comments: obese Restrictions Weight Bearing Restrictions: No  Pain: Pain Assessment Pain Assessment: 0-10 Pain Score:   3 Pain Type: Chronic pain Pain Location: Knee Pain Orientation: Left Pain Intervention(s): Medication (See eMAR);Repositioned Multiple Pain Sites: No ADL: See FIM for current functional status  Therapy/Group: Individual Therapy  MCGUIRE,JAMES OTR/L 07/01/2012, 4:21 PM

## 2012-07-01 NOTE — Progress Notes (Signed)
Inpatient Rehabilitation Center Individual Statement of Services  Patient Name:  [name]  Date:  07/01/2012  Welcome to the Inpatient Rehabilitation Center.  Our goal is to provide you with an individualized program based on your diagnosis and situation, designed to meet your specific needs.  With this comprehensive rehabilitation program, you will be expected to participate in at least 3 hours of rehabilitation therapies Monday-Friday, with modified therapy programming on the weekends.  Your rehabilitation program will include the following services:  Physical Therapy (PT), Occupational Therapy (OT), 24 hour per day rehabilitation nursing, Therapeutic Recreaction (TR), Case Management (Social Worker), Rehabilitation Medicine, Nutrition Services and Pharmacy Services  Weekly team conferences will be held on Tuesdays to discuss your progress.  Your  Social Worker will talk with you frequently to get your input and to update you on team discussions.  Team conferences with you and your family in attendance may also be held.  Expected length of stay: 7-10 days  Overall anticipated outcome: modified independent  Depending on your progress and recovery, your program may change.  Your  Social Worker will coordinate services and will keep you informed of any changes.  Your  Social Worker's name and contact numbers are listed  below.  The following services may also be recommended but are not provided by the Inpatient Rehabilitation Center:   Driving Evaluations  Home Health Rehabiltiation Services  Outpatient Rehabilitatation Servives    Arrangements will be made to provide these services after discharge if needed.  Arrangements include referral to agencies that provide these services.  Your insurance has been verified to be:  Medicare and BCBS Your primary doctor is:  Dr. Dwana Melena  Pertinent information will be shared with your doctor and your insurance company.   Social Worker:  [place], [place] [phone] or [phone]  Information discussed with and copy given to patient by: [name], 07/01/2012, 1:27 PM

## 2012-07-01 NOTE — Progress Notes (Signed)
Physical Therapy Session Note  Patient Details  Name: [name] MRN: [id number] Date of Birth: [birth year]  Today's Date: 07/01/2012 Time: 6578-4696 Time Calculation (min): 28 min  Short Term Goals: Week 1:  PT Short Term Goal 1 (Week 1): = LTGs  Skilled Therapeutic Interventions/Progress Updates:    Treatment focused on seated LE therex with 3 # ankle weight on RLE and 2# ankle weight on LLE including LAQ, seated marches, heel raises, and toe raises x 10 reps with 3 second hold each x 2 sets. W/c mobility in room to reposition chair with S.  Therapy Documentation Precautions:  Precautions Precautions: Fall Restrictions Weight Bearing Restrictions: No  Pain: No complaints.    See FIM for current functional status  Therapy/Group: Individual Therapy  [name] [facility] 07/01/2012, 3:28 PM

## 2012-07-01 NOTE — Progress Notes (Signed)
Occupational Therapy Session Note  Patient Details  Name: [name] MRN: [id number] Date of Birth: [birth year]  Today's Date: 07/01/2012 Time: 4098-1191 Time Calculation (min): 55 min  Short Term Goals: Week 1:  OT Short Term Goal 1 (Week 1): Short Term Goals = Long Term Goals  Skilled Therapeutic Interventions/Progress Updates:  Patient found supine in bed with no complaints of pain. Patient engaged in bed mobility with HOB raised and bed rails. Patient then performed sit->stand with height of bed raised and supervision from therapist. Patient then ambulated with rolling walker into bathroom for toilet transfer onto elevated toilet seat using grab bars prn with close supervision.  Patient ambulated out of bathroom -> w/c set-up at sink side for ADL. Patient performed ADL in sit->stand position; min assist for UB/LB bathing, supervision for UB dressing, Mod assist for LB dressing. Therapist donned lotion -> back and BLEs. Therapist also donned compression stockings -> BLEs. Patient sat at sink for grooming tasks with set-up assist. At end of session left patient seated in w/c at sink to finish grooming tasks. Patient able to maneuver around room prn in w/c.  Precautions:  Precautions Precautions: Fall Restrictions Weight Bearing Restrictions: No  See FIM for current functional status  Therapy/Group: Individual Therapy  Camran Keady 07/01/2012, 9:36 AM

## 2012-07-01 NOTE — Progress Notes (Signed)
Advanced Heart Failure Rounding Note   Subjective:    [name] is a 63 year with a PMH of morbid obesity, chronic diastolic heart failure EF 50% (previous EF 60-65%), HTN, SVT/PSVT/PAT, CAD, DMII, OSA, arthritis, fibromyalgia, and hyperlipidemia.   [name] says that he has gained 100 pounds over the last year. Progressive dyspnea and edema over the last month. Over the last few months he has transitioned from a cane, to a walker, and now uses wheelchair. Uses CPAP at night. He also reports drinking sports drinks suggested by his pharmacist which aided in weight gain. Does not weigh at home. Lives with his wife. Limited family support.   Admitted from [facility] office with increased lower extremity edema despite increase outpatient diuretics by his PCP. +orthopnea and PND Prior to admit Demadex was increased to 100 mg twice a day with poor response.   No DVT per LE dopplers.   Diuretics held for 2 days due to rise in Cr.  1.19>1.37>1.53>1.65>1.79>1.81>2.1>1.83>1.81. Wt 368 pounds.     Moved CIR 3/4.  Slept better last night.  Doing well with therapy.  Denies orthopnea/PND.  No chest pain.    Objective:    Vital Signs:   Temp:  [98 F (36.7 C)-98.1 F (36.7 C)] 98 F (36.7 C) (03/06 1436) Pulse Rate:  [60-68] 67 (03/06 1436) Resp:  [18-20] 20 (03/06 1436) BP: (132-151)/(58-80) 151/80 mmHg (03/06 1436) SpO2:  [94 %-97 %] 94 % (03/06 1436) Weight:  [167.1 kg (368 lb 6.2 oz)] 167.1 kg (368 lb 6.2 oz) (03/06 0557) Last BM Date: 06/28/12  Weight change: Filed Weights   06/30/12 0550 07/01/12 0500 07/01/12 0557  Weight: 166.9 kg (367 lb 15.2 oz) 167.1 kg (368 lb 6.2 oz) 167.1 kg (368 lb 6.2 oz)    Intake/Output:   Intake/Output Summary (Last 24 hours) at 07/01/12 1543 Last data filed at 07/01/12 1200  Gross per 24 hour  Intake    840 ml  Output   1350 ml  Net   -510 ml     Physical Exam: General: Morbidly obese. Sitting in chair No resp difficulty HEENT: normal except  for blind in L eye  Neck: supple. JVP hard to see Carotids 2+ bilat; no bruits. No lymphadenopathy or thryomegaly appreciated.  Cor: PMI nonpalpable. Regular rate & rhythm. No rubs, gallops or murmurs.  Lungs: clear  Abdomen: morbidly obese, soft, nontender, + distended. . No bruits or masses. Good bowel sounds.  Extremities: no cyanosis, clubbing, rash, RLL tr LLE 3+ edema severe chronic venous stasis changes.  Neuro: alert & orientedx3, cranial nerves grossly intact. moves all 4 extremities w/o difficulty. Affect pleasant   Telemetry: SR  Labs: Basic Metabolic Panel:  Recent Labs Lab 06/27/12 0906 06/28/12 0143 06/29/12 0919 06/29/12 1827 06/30/12 0725 07/01/12 0726  NA 140 138 137  --  140 139  K 2.8* 2.8* 3.1*  --  3.3* 3.7  CL 92* 91* 91*  --  95* 97  CO2 38* 35* 34*  --  33* 31  GLUCOSE 151* 193* 235*  --  140* 145*  BUN 37* 44* 51*  --  54* 54*  CREATININE 1.65* 1.79* 1.89* 2.10* 1.83* 1.81*  CALCIUM 10.2 9.6 9.4  --  9.3 9.5    Liver Function Tests:  Recent Labs Lab 06/30/12 0725  AST 37  ALT 17  ALKPHOS 67  BILITOT 0.9  PROT 6.5  ALBUMIN 3.4*   No results found for this basename: LIPASE, AMYLASE,  in  the last 168 hours No results found for this basename: AMMONIA,  in the last 168 hours  CBC:  Recent Labs Lab 06/29/12 1827 06/30/12 0725  WBC 5.9 4.8  NEUTROABS  --  2.4  HGB 12.1* 10.9*  HCT 34.7* 31.4*  MCV 94.0 92.4  PLT 128* 103*     Medications:     Scheduled Medications: . aspirin  162 mg Oral Daily  . cholecalciferol  5,000 Units Oral Daily  . fluticasone  2 spray Each Nare Daily  . gabapentin  300 mg Oral QHS  . heparin  5,000 Units Subcutaneous Q8H  . insulin aspart  0-20 Units Subcutaneous TID WC  . insulin glargine  45 Units Subcutaneous BID  . labetalol  200 mg Oral BID  . latanoprost  1 drop Right Eye QHS  . loratadine  10 mg Oral QPM  . montelukast  10 mg Oral QHS  . multivitamin with minerals  1 tablet Oral Daily  .  potassium chloride  40 mEq Oral BID  . simvastatin  40 mg Oral QHS  . tamsulosin  0.4 mg Oral QHS    Infusions:    PRN Medications: acetaminophen, HYDROcodone-acetaminophen, lidocaine, ondansetron (ZOFRAN) IV, ondansetron, sorbitol   Assessment:   1. Acute Diastolic Heart Failure  2. Morbid Obesity  3. OSA  4. HTN  5. Arthritis  6. DMII  7. CAD  8. Fibromyalgia  9. L eye blind  10. Severe deconditioning  11. Hypokalemia  12. Hearing loss- over the last year 13. Acute renal failure  Plan/Discussion:    Renal function continues to improve slowly off diuretics and weight is stable.  Will continue to hold diuretics as renal function recovers.  Likely restart torsemide tomorrow at 60 daily and follow up .   Appreciate CIR's assistance.    Length of Stay: 2 [place] 3:44 PM

## 2012-07-01 NOTE — Progress Notes (Signed)
Physical Therapy Session Note  Patient Details  Name: [name] MRN: [id number] Date of Birth: 27-Dec-1931  Today's Date: 07/01/2012 Time: 0930-1030 Time Calculation (min): 60 min  Short Term Goals: Week 1:  PT Short Term Goal 1 (Week 1): = LTGs  Skilled Therapeutic Interventions/Progress Updates:    Treatment focused on overall endurance, strengthening, gait training, and dynamic balance/standing tolerance activities. Seated therex on Kinetron increasing resistance from 70 to 20 after sets of 10 reps each time and rest breaks as needed for functional strengthening. Gait with RW with steady A; cues to keep walker close to body and for posture; wide BOS and shuffled gait pattern noted. Dynamic standing balance activity for ball toss and tap with 1 and both UEs with overall steady A (increased as fatigued).   Therapy Documentation Precautions:  Precautions Precautions: Fall Restrictions Weight Bearing Restrictions: No   Pain: Denies pain. Reports some soreness from working hard yesterday in therapy.   Locomotion : Ambulation Ambulation/Gait Assistance: 4: Min guard   See FIM for current functional status  Therapy/Group: Individual Therapy  [name] [facility] 07/01/2012, 12:14 PM

## 2012-07-01 NOTE — Progress Notes (Signed)
Subjective/Complaints: Had a good day with therapy yesterday. Pleased with progress. Weight/edema loss has helped. Slept well. A 12 point review of systems has been performed and if not noted above is otherwise negative.   Objective: Vital Signs: Blood pressure 139/58, pulse 60, temperature 98.1 F (36.7 C), temperature source Oral, resp. rate 18, weight 167.1 kg (368 lb 6.2 oz), SpO2 97.00%. No results found.  Recent Labs  06/29/12 1827 06/30/12 0725  WBC 5.9 4.8  HGB 12.1* 10.9*  HCT 34.7* 31.4*  PLT 128* 103*    Recent Labs  06/29/12 0919 06/29/12 1827 06/30/12 0725  NA 137  --  140  K 3.1*  --  3.3*  CL 91*  --  95*  GLUCOSE 235*  --  140*  BUN 51*  --  54*  CREATININE 1.89* 2.10* 1.83*  CALCIUM 9.4  --  9.3   CBG (last 3)   Recent Labs  06/30/12 1113 06/30/12 1636 06/30/12 2053  GLUCAP 134* 148* 186*    Wt Readings from Last 3 Encounters:  07/01/12 167.1 kg (368 lb 6.2 oz)  06/29/12 168.693 kg (371 lb 14.4 oz)  06/23/12 170.099 kg (375 lb)    Physical Exam:  Constitutional: He is oriented to person, place, and time. Wearing CPAP/nasal 79 year old morbidly obese white male.  Eyes:  Left eye enucleated Neck: Neck supple. No thyromegaly present.  Cardiovascular:  Cardiac rate controlled. No murmurs rubs or gallops, regular  Pulmonary/Chest:  Decreased breath sounds at the bases without wheeze. No distress  Abdominal: Soft. Bowel sounds are normal. He exhibits no distension. There is no tenderness.  Musculoskeletal:  3+ pitting edema left leg. 2+ right leg. Chronic stasis changes in either leg. Left knee limited with A and P flexion due to pain. Right knee appears less affected.  Neurological: He is alert and oriented to person, place, and time.  Follows simple commands. Moves all 4's. No focal weakness although inhibition with pain and lmitations due to severe edema in each leg. UE grossly 4/5. Very HOH  Skin: Skin is warm and dry. Flaking/indurated  legs Psychiatric: He has a normal mood and affect. His behavior is normal. Judgment and thought content normal  Assessment/Plan: 1. Functional deficits secondary to deconditioning related to diastolic CHF and multiple medical issues which require 3+ hours per day of interdisciplinary therapy in a comprehensive inpatient rehab setting. Physiatrist is providing close team supervision and 24 hour management of active medical problems listed below. Physiatrist and rehab team continue to assess barriers to discharge/monitor patient progress toward functional and medical goals.     FIM: FIM - Bathing Bathing: 0: Activity did not occur  FIM - Upper Body Dressing/Undressing Upper body dressing/undressing steps patient completed: Thread/unthread right sleeve of pullover shirt/dresss;Thread/unthread left sleeve of pullover shirt/dress;Put head through opening of pull over shirt/dress;Pull shirt over trunk Upper body dressing/undressing: 5: Supervision: Safety issues/verbal cues FIM - Lower Body Dressing/Undressing Lower body dressing/undressing steps patient completed: Thread/unthread right underwear leg;Thread/unthread left underwear leg;Pull underwear up/down Lower body dressing/undressing: 2: Max-Patient completed 25-49% of tasks  FIM - Toileting Toileting: 0: Activity did not occur  FIM - Diplomatic Services operational officer Devices: Bedside commode;Grab bars;Walker Toilet Transfers: 4-To toilet/BSC: Min A (steadying Pt. > 75%);4-From toilet/BSC: Min A (steadying Pt. > 75%)  FIM - Bed/Chair Transfer Bed/Chair Transfer Assistive Devices: Therapist, occupational: 4: Bed > Chair or W/C: Min A (steadying Pt. > 75%);4: Chair or W/C > Bed: Min A (steadying Pt. > 75%);4: Supine >  Sit: Min A (steadying Pt. > 75%/lift 1 leg);5: Sit > Supine: Supervision (verbal cues/safety issues)  FIM - Locomotion: Wheelchair Locomotion: Wheelchair: 1: Travels less than 50 ft with supervision, cueing or  coaxing FIM - Locomotion: Ambulation Locomotion: Ambulation Assistive Devices: Designer, industrial/product Ambulation/Gait Assistance: 4: Min assist Locomotion: Ambulation: 2: Travels 50 - 149 ft with minimal assistance (Pt.>75%)  Comprehension Comprehension Mode: Auditory Comprehension: 6-Follows complex conversation/direction: With extra time/assistive device  Expression Expression Mode: Verbal Expression: 6-Expresses complex ideas: With extra time/assistive device  Social Interaction Social Interaction: 6-Interacts appropriately with others with medication or extra time (anti-anxiety, antidepressant).  Problem Solving Problem Solving: 6-Solves complex problems: With extra time  Memory Memory: 6-More than reasonable amt of time  Medical Problem List and Plan:  1. Physical deconditioning related to diastolic congestive heart failure and multiple medical issues.  2. DVT Prophylaxis/Anticoagulation: Subcutaneous heparin. Monitor platelet counts any signs of bleeding.  3. Pain Management: Neurontin 300 mg each bedtime, Lidoderm patch as needed hydrocodone 1 tablet every 6 hours as needed pain.  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  4. Neuropsych: This patient is capable of making decisions on his/her own behalf.  5. Diastolic congestive heart failure. Patient has diuresed approx 39 pounds thus far. Followup per cardiology services   Diuretics currently on hold pending volume/electrolyte balance.   -daily weights, stable at 167.1kg today  -continue potassium replacement  -recheck bmet tomorrow 6. 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 Diabetes mellitus with peripheral neuropathy. Hemoglobin A1c 7.6. Lantus insulin 40 units twice a day. Check blood sugars a.c. and at bedtime. Titrate lantus to 45u 8. Sleep apnea. Continue CPAP  9. CAD/PSVT. Continue aspirin therapy. No chest pain or shortness of breath. Followup cardiology  services LOS (Days) 2 A FACE TO FACE EVALUATION WAS PERFORMED  SWARTZ,ZACHARY T 07/01/2012 7:27 AM
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
## 2012-07-02 ENCOUNTER — Inpatient Hospital Stay (HOSPITAL_COMMUNITY): Payer: Medicare Other | Admitting: Occupational Therapy

## 2012-07-02 ENCOUNTER — Inpatient Hospital Stay (HOSPITAL_COMMUNITY): Payer: Medicare Other

## 2012-07-02 LAB — GLUCOSE, CAPILLARY
Glucose-Capillary: 148 mg/dL — ABNORMAL HIGH (ref 70–99)
Glucose-Capillary: 145 mg/dL — ABNORMAL HIGH (ref 70–99)

## 2012-07-02 LAB — BASIC METABOLIC PANEL
BUN: 52 mg/dL — ABNORMAL HIGH (ref 6–23)
Chloride: 98 mEq/L (ref 96–112)
GFR calc Af Amer: 39 mL/min — ABNORMAL LOW (ref >90)
GFR calc non Af Amer: 33 mL/min — ABNORMAL LOW (ref >90)
Glucose, Bld: 141 mg/dL — ABNORMAL HIGH (ref 70–99)
Potassium: 3.7 mEq/L (ref 3.5–5.1)
Sodium: 137 mEq/L (ref 135–145)

## 2012-07-02 LAB — URINALYSIS, ROUTINE W REFLEX MICROSCOPIC
Nitrite: NEGATIVE
Protein, ur: NEGATIVE mg/dL
Specific Gravity, Urine: 1.015 (ref 1.005–1.030)
Urobilinogen, UA: 1 mg/dL (ref 0.0–1.0)

## 2012-07-02 LAB — URINE MICROSCOPIC-ADD ON

## 2012-07-02 NOTE — Progress Notes (Signed)
Advanced Heart Failure Rounding Note   Subjective:    [name] is a 3 year with a PMH of morbid obesity, chronic diastolic heart failure EF 50% (previous EF 60-65%), HTN, SVT/PSVT/PAT, CAD, DMII, OSA, arthritis, fibromyalgia, and hyperlipidemia.   [name] says that he has gained 100 pounds over the last year. Progressive dyspnea and edema over the last month. Over the last few months he has transitioned from a cane, to a walker, and now uses wheelchair. Uses CPAP at night. He also reports drinking sports drinks suggested by his pharmacist which aided in weight gain. Does not weigh at home. Lives with his wife. Limited family support.   Admitted from [facility] office with increased lower extremity edema despite increase outpatient diuretics by his PCP. +orthopnea and PND Prior to admit Demadex was increased to 100 mg twice a day with poor response.   No DVT per LE dopplers.   Diuretics held due to rise in Cr.  1.19>1.37>1.53>1.65>1.79>1.81>2.1>1.83>1.81>1.83.  Moved CIR 3/4.  Weight increased 2 pounds overnight, 317 pounds.  Slept better last night.  Doing well with therapy.  Denies orthopnea/PND.  No chest pain.    Objective:    Vital Signs:   Temp:  [97.7 F (36.5 C)-98 F (36.7 C)] 97.7 F (36.5 C) (03/07 0527) Pulse Rate:  [67-70] 70 (03/07 0527) Resp:  [18-20] 18 (03/07 0527) BP: (136-151)/(80-87) 136/87 mmHg (03/07 0527) SpO2:  [92 %-96 %] 92 % (03/07 0527) Weight:  [168.4 kg (371 lb 4.1 oz)] 168.4 kg (371 lb 4.1 oz) (03/07 0527) Last BM Date: 07/01/12  Weight change: Filed Weights   07/01/12 0500 07/01/12 0557 07/02/12 0527  Weight: 167.1 kg (368 lb 6.2 oz) 167.1 kg (368 lb 6.2 oz) 168.4 kg (371 lb 4.1 oz)    Intake/Output:   Intake/Output Summary (Last 24 hours) at 07/02/12 0936 Last data filed at 07/02/12 0900  Gross per 24 hour  Intake    960 ml  Output   1475 ml  Net   -515 ml     Physical Exam: General: Morbidly obese. Sitting in chair No resp  difficulty HEENT: normal except for blind in L eye  Neck: supple. JVP hard to see Carotids 2+ bilat; no bruits. No lymphadenopathy or thryomegaly appreciated.  Cor: PMI nonpalpable. Regular rate & rhythm. No rubs, gallops or murmurs.  Lungs: clear  Abdomen: morbidly obese, soft, nontender, + distended. . No bruits or masses. Good bowel sounds.  Extremities: no cyanosis, clubbing, rash, RLL tr LLE 3+ edema severe chronic venous stasis changes.  Neuro: alert & orientedx3, cranial nerves grossly intact. moves all 4 extremities w/o difficulty. Affect pleasant   Telemetry: SR  Labs: Basic Metabolic Panel:  Recent Labs Lab 06/28/12 0143 06/29/12 0919 06/29/12 1827 06/30/12 0725 07/01/12 0726 07/02/12 0639  NA 138 137  --  140 139 137  K 2.8* 3.1*  --  3.3* 3.7 3.7  CL 91* 91*  --  95* 97 98  CO2 35* 34*  --  33* 31 29  GLUCOSE 193* 235*  --  140* 145* 141*  BUN 44* 51*  --  54* 54* 52*  CREATININE 1.79* 1.89* 2.10* 1.83* 1.81* 1.83*  CALCIUM 9.6 9.4  --  9.3 9.5 9.2    Liver Function Tests:  Recent Labs Lab 06/30/12 0725  AST 37  ALT 17  ALKPHOS 67  BILITOT 0.9  PROT 6.5  ALBUMIN 3.4*   No results found for this basename: LIPASE, AMYLASE,  in the  last 168 hours No results found for this basename: AMMONIA,  in the last 168 hours  CBC:  Recent Labs Lab 06/29/12 1827 06/30/12 0725  WBC 5.9 4.8  NEUTROABS  --  2.4  HGB 12.1* 10.9*  HCT 34.7* 31.4*  MCV 94.0 92.4  PLT 128* 103*     Medications:     Scheduled Medications: . aspirin  162 mg Oral Daily  . cholecalciferol  5,000 Units Oral Daily  . fluticasone  2 spray Each Nare Daily  . gabapentin  300 mg Oral QHS  . heparin  5,000 Units Subcutaneous Q8H  . insulin aspart  0-20 Units Subcutaneous TID WC  . insulin glargine  45 Units Subcutaneous BID  . labetalol  200 mg Oral BID  . latanoprost  1 drop Right Eye QHS  . loratadine  10 mg Oral QPM  . montelukast  10 mg Oral QHS  . multivitamin with minerals   1 tablet Oral Daily  . potassium chloride  40 mEq Oral BID  . simvastatin  40 mg Oral QHS  . tamsulosin  0.4 mg Oral QHS    Infusions:    PRN Medications: acetaminophen, HYDROcodone-acetaminophen, lidocaine, ondansetron (ZOFRAN) IV, ondansetron, sorbitol   Assessment:   1. Acute Diastolic Heart Failure  2. Morbid Obesity  3. OSA  4. HTN  5. Arthritis  6. DMII  7. CAD  8. Fibromyalgia  9. L eye blind  10. Severe deconditioning  11. Hypokalemia  12. Hearing loss- over the last year 13. Acute renal failure  Plan/Discussion:    Renal function stable  (at slightly higher baseline) and weight climbing.  Will start back torsemide 60 mg daily and follow weight/renal function very closely.    Appreciate CIR's assistance.    Length of Stay: 3 Daniel Bensimhon,MD 9:37 AM

## 2012-07-02 NOTE — Progress Notes (Signed)
Nursing Note: Pt tolerated cath  Well.wbb

## 2012-07-02 NOTE — Progress Notes (Signed)
Nursing Note: No void in 8 hours. Pt cathed by NT for 350 cc.wbb

## 2012-07-02 NOTE — Progress Notes (Addendum)
Subjective/Complaints: Urine retention. I/O cath for 350 and 500 cc last night. A 12 point review of systems has been performed and if not noted above is otherwise negative.   Objective: Vital Signs: Blood pressure 136/87, pulse 70, temperature 97.7 F (36.5 C), temperature source Oral, resp. rate 18, weight 168.4 kg (371 lb 4.1 oz), SpO2 92.00%. No results found.  Recent Labs  06/29/12 1827 06/30/12 0725  WBC 5.9 4.8  HGB 12.1* 10.9*  HCT 34.7* 31.4*  PLT 128* 103*    Recent Labs  06/30/12 0725 07/01/12 0726  NA 140 139  K 3.3* 3.7  CL 95* 97  GLUCOSE 140* 145*  BUN 54* 54*  CREATININE 1.83* 1.81*  CALCIUM 9.3 9.5   CBG (last 3)   Recent Labs  07/01/12 1719 07/01/12 2024 07/02/12 0712  GLUCAP 143* 197* 133*    Wt Readings from Last 3 Encounters:  07/02/12 168.4 kg (371 lb 4.1 oz)  06/29/12 168.693 kg (371 lb 14.4 oz)  06/23/12 170.099 kg (375 lb)    Physical Exam:  Constitutional: He is oriented to person, place, and time. Wearing CPAP/nasal 79 year old morbidly obese white male.  Eyes:  Left eye enucleated Neck: Neck supple. No thyromegaly present.  Cardiovascular:  Cardiac rate controlled. No murmurs rubs or gallops, regular  Pulmonary/Chest:  Decreased breath sounds at the bases without wheeze. No distress  Abdominal: Soft. Bowel sounds are normal. He exhibits no distension. There is no tenderness.  Musculoskeletal:  3+ pitting edema left leg. 2+ right leg. Chronic stasis changes in either leg. Left knee limited with A and P flexion due to pain. Right knee appears less affected.  Neurological: He is alert and oriented to person, place, and time.  Follows simple commands. Moves all 4's. No focal weakness although inhibition with pain and lmitations due to severe edema in each leg. UE grossly 4/5. Very HOH  Skin: Skin is warm and dry. Flaking/indurated legs Psychiatric: He has a normal mood and affect. His behavior is normal. Judgment and thought  content normal  Assessment/Plan: 1. Functional deficits secondary to deconditioning related to diastolic CHF and multiple medical issues which require 3+ hours per day of interdisciplinary therapy in a comprehensive inpatient rehab setting. Physiatrist is providing close team supervision and 24 hour management of active medical problems listed below. Physiatrist and rehab team continue to assess barriers to discharge/monitor patient progress toward functional and medical goals.     FIM: FIM - Bathing Bathing Steps Patient Completed: Right Arm;Left Arm;Chest;Abdomen;Front perineal area;Buttocks;Right upper leg;Left upper leg Bathing: 4: Min-Patient completes 8-9 12f 10 parts or 75+ percent  FIM - Upper Body Dressing/Undressing Upper body dressing/undressing steps patient completed: Thread/unthread right sleeve of pullover shirt/dresss;Thread/unthread left sleeve of pullover shirt/dress;Put head through opening of pull over shirt/dress;Pull shirt over trunk Upper body dressing/undressing: 5: Set-up assist to: Obtain clothing/put away FIM - Lower Body Dressing/Undressing Lower body dressing/undressing steps patient completed: Thread/unthread right underwear leg;Thread/unthread left underwear leg;Pull underwear up/down;Thread/unthread right pants leg;Thread/unthread left pants leg;Pull pants up/down Lower body dressing/undressing: 3: Mod-Patient completed 50-74% of tasks  FIM - Toileting Toileting steps completed by patient: Adjust clothing prior to toileting;Performs perineal hygiene;Adjust clothing after toileting Toileting: 5: Supervision: Safety issues/verbal cues  FIM - Archivist Transfers Assistive Devices: Elevated toilet seat;Walker;Grab bars Toilet Transfers: 5-To toilet/BSC: Supervision (verbal cues/safety issues);5-From toilet/BSC: Supervision (verbal cues/safety issues)  FIM - Bed/Chair Transfer Bed/Chair Transfer Assistive Devices: Manufacturing systems engineer Transfer: 4:  Bed > Chair or W/C: Min A (steadying Pt. >  75%);4: Chair or W/C > Bed: Min A (steadying Pt. > 75%);4: Supine > Sit: Min A (steadying Pt. > 75%/lift 1 leg);5: Sit > Supine: Supervision (verbal cues/safety issues)  FIM - Locomotion: Wheelchair Locomotion: Wheelchair: 1: Total Assistance/staff pushes wheelchair (Pt<25%) FIM - Locomotion: Ambulation Locomotion: Ambulation Assistive Devices: Designer, industrial/product Ambulation/Gait Assistance: 4: Min guard Locomotion: Ambulation: 2: Travels 50 - 149 ft with minimal assistance (Pt.>75%)  Comprehension Comprehension Mode: Auditory Comprehension: 6-Follows complex conversation/direction: With extra time/assistive device  Expression Expression Mode: Verbal Expression: 6-Expresses complex ideas: With extra time/assistive device  Social Interaction Social Interaction: 6-Interacts appropriately with others with medication or extra time (anti-anxiety, antidepressant).  Problem Solving Problem Solving: 6-Solves complex problems: With extra time  Memory Memory: 6-More than reasonable amt of time  Medical Problem List and Plan:  1. Physical deconditioning related to diastolic congestive heart failure and multiple medical issues.  2. DVT Prophylaxis/Anticoagulation: Subcutaneous heparin. Monitor platelet counts any signs of bleeding.  3. Pain Management: Neurontin 300 mg each bedtime, Lidoderm patch as needed hydrocodone 1 tablet every 6 hours as needed pain.  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  4. Neuropsych: This patient is capable of making decisions on his/her own behalf.  5. Diastolic congestive heart failure. Significant diuresis on acute. Followup per cardiology services   Diuretics currently on hold pending volume/electrolyte balance.   -daily weights, up sl to 168.4kg today  -continue potassium replacement  -serial bmets   -consider resuming diuretics today 6. History of trauma with left eye blindness left upper  extremity disability  7. Diabetes mellitus with peripheral neuropathy. Hemoglobin A1c 7.6.   Check blood sugars a.c. and at bedtime. Titrated lantus to 45u bid with some improvement yesterday--observe today 8. Sleep apnea. Continue CPAP  9. CAD/PSVT. Continue aspirin therapy. No chest pain or shortness of breath. Followup cardiology services 10. Urine retention. Check UA and culture. OOB to void/ double voids LOS (Days) 3 A FACE TO FACE EVALUATION WAS PERFORMED  SWARTZ,ZACHARY T 07/02/2012 7:21 AM

## 2012-07-02 NOTE — Progress Notes (Signed)
Nursing Note: No void in 8 hours. Pt In and Out cathed for 500 cc.Pt cathed by NT and tolerated well.wbb

## 2012-07-02 NOTE — Progress Notes (Signed)
Occupational Therapy Note  Patient Details  Name: KIET GEER MRN: [id number] Date of Birth: 03-17-1932 Today's Date: 07/02/2012  Time: 1100-1130 Pt denies pain Individual Therapy  Pt engaged in dynamic standing activities and functional amb with RW to gather items from various surface heights.  Pt required multiple rest breaks throughout session.  Focus on activity tolerance, dynamic standing balance, functional amb with RW, AE use, and safety awareness.   Lavone Neri [facility] 07/02/2012, 12:11 PM

## 2012-07-02 NOTE — Progress Notes (Signed)
Physical Therapy Session Note  Patient Details  Name: EDD REPPERT MRN: [id number] Date of Birth: 19-Oct-1931  Today's Date: 07/02/2012 Time: 1000-1055 Time Calculation (min): 55 min  Short Term Goals: Week 1:  PT Short Term Goal 1 (Week 1): = LTGs  Skilled Therapeutic Interventions/Progress Updates:    Discussed d/c planning with DME available; pt reporting "new horizons have opened up" with his ability to get up and walk around and wanting to make sure he has the right set up at home. Gait with RW x  78' with close S; cues to stay close to Rw as fatigued. Dynamic balance activity including sit to stands and reaching outside BOS for horseshoe to place on and take off basketball hoop x 5 reps x 4 sets with seated rest breaks between sets. Kinetron seated position for functional strengthening x 15 reps bilaterally increasing resistance from 30 to 20 to 10 between sets.   Therapy Documentation Precautions:  Precautions Precautions: Fall Precaution Comments: obese Restrictions Weight Bearing Restrictions: No   Pain:  No complaints.  See FIM for current functional status  Therapy/Group: Individual Therapy  [name] [facility] 07/02/2012, 11:01 AM

## 2012-07-02 NOTE — Progress Notes (Signed)
Physical Therapy Session Note  Patient Details  Name: [name] MRN: [id number] Date of Birth: 08/17/1931  Today's Date: 07/02/2012 Time: 4098-1191 Time Calculation (min): 44 min  Short Term Goals: Week 1:  PT Short Term Goal 1 (Week 1): = LTGs  Skilled Therapeutic Interventions/Progress Updates:    Gait with RW for endurance and strengthening x 60' x 2 at beginning and end of session with close S to steady A; cues for posture and staying close to RW. Seated therex for functional LE strengthening with 2-3 # ankle weights for LAQ, marches, heel raises, and toe raises x 10 reps x 2 sets each bilaterally.  Therapy Documentation Precautions:  Precautions Precautions: Fall Precaution Comments: obese Restrictions Weight Bearing Restrictions: No   Pain: Reports discomfort in knees with movement - no intervention required.  See FIM for current functional status  Therapy/Group: Individual Therapy  [name] [facility] 07/02/2012, 2:30 PM

## 2012-07-02 NOTE — Progress Notes (Signed)
Occupational Therapy Session Note  Patient Details  Name: [name] MRN: [id number] Date of Birth: 12-Dec-1931  Today's Date: 07/02/2012 Time: 0801-0903 Time Calculation (min): 62 min  Short Term Goals: Week 1:  OT Short Term Goal 1 (Week 1): Short Term Goals = Long Term Goals  Skilled Therapeutic Interventions/Progress Updates:    Worked on toileting initially having pt ambulate to the bathroom using his RW.  Able to perform transfers with overall min assist including sit to stand.  Transitioned to the sink for bathing and dressing session.  Pt with difficulty donning sock and shoe over the left foot but was able to donn and tie his right shoe by crossing it over his left knee.  Pt needed min assist for all sit to stand from the wheelchair.  Needs min instructional cueing for hand placement with sitting down.  He tends to flop down into the chair.  Therapy Documentation Precautions:  Precautions Precautions: Fall Precaution Comments: obese Restrictions Weight Bearing Restrictions: No  Pain: Pain Assessment Pain Assessment: Faces Faces Pain Scale: Hurts a little bit Pain Type: Acute pain Pain Location: Knee Pain Orientation: Left Pain Intervention(s): Repositioned   See FIM for current functional status  Therapy/Group: Individual Therapy  MCGUIRE,JAMES OTR/L 07/02/2012, 11:18 AM

## 2012-07-03 ENCOUNTER — Encounter (HOSPITAL_COMMUNITY): Payer: Medicare Other | Admitting: Occupational Therapy

## 2012-07-03 ENCOUNTER — Inpatient Hospital Stay (HOSPITAL_COMMUNITY): Payer: Medicare Other | Admitting: *Deleted

## 2012-07-03 ENCOUNTER — Inpatient Hospital Stay (HOSPITAL_COMMUNITY): Payer: Medicare Other | Admitting: Occupational Therapy

## 2012-07-03 ENCOUNTER — Inpatient Hospital Stay (HOSPITAL_COMMUNITY): Payer: Medicare Other | Admitting: Physical Therapy

## 2012-07-03 DIAGNOSIS — E669 Obesity, unspecified: Secondary | ICD-10-CM

## 2012-07-03 DIAGNOSIS — I509 Heart failure, unspecified: Secondary | ICD-10-CM

## 2012-07-03 DIAGNOSIS — M1710 Unilateral primary osteoarthritis, unspecified knee: Secondary | ICD-10-CM

## 2012-07-03 DIAGNOSIS — R5381 Other malaise: Secondary | ICD-10-CM

## 2012-07-03 LAB — GLUCOSE, CAPILLARY
Glucose-Capillary: 141 mg/dL — ABNORMAL HIGH (ref 70–99)
Glucose-Capillary: 139 mg/dL — ABNORMAL HIGH (ref 70–99)
Glucose-Capillary: 172 mg/dL — ABNORMAL HIGH (ref 70–99)
Glucose-Capillary: 182 mg/dL — ABNORMAL HIGH (ref 70–99)

## 2012-07-03 LAB — URINE CULTURE: Colony Count: 85000

## 2012-07-03 NOTE — Progress Notes (Signed)
Physical Therapy Session Note  Patient Details  Name: ZAYN SELLEY MRN: [id number] Date of Birth: [birth year]  Today's Date: 07/03/2012 Time: 1115-1200 Time Calculation (min): 45 min  Short Term Goals: Week 1:  PT Short Term Goal 1 (Week 1): = LTGs   Therapy Documentation Precautions:  Precautions Precautions: Fall Precaution Comments: obese Restrictions Weight Bearing Restrictions: No Pain: currently denies pain  Therapeutic Activity:(15') Transfer training sit<->stand with min-A w/c into RW multiple reps Therapeutic Exercise:(15') B LE's in sitting, Kinetron (60cm/sec) x 5' while seated in manual w/c Gait Training:(15') using RW x 70' without reports of light headedness and BP's remaining WNL  See FIM for current functional status  Therapy/Group: Individual Therapy  LUNDGREN, DANIEL J 07/03/2012, 11:19 AM

## 2012-07-03 NOTE — Progress Notes (Signed)
Physical Therapy Note  Patient Details  Name: [name] MRN: [id number] Date of Birth: 1932/01/11 Today's Date: 07/03/2012  1000-1055 (55 minutes) group Pain: no complaint of pain Pt participated in PT group session focused on gait training/safety/endurance. Pt ambulates 83 feet , 42 feet with RW close SBA; sit to stand from raised mat (23 inches )  max assist without using RW for support 3 X 5 ( decreased timing of knee extension).   HOUT,JIM 07/03/2012, 11:51 AM

## 2012-07-03 NOTE — Progress Notes (Signed)
Called by RN to assist pt with putting on CPAP. Pt. Has home unit. Handed pt. Mask, turned machine on and turned on O2 at 2 lpm.  Pt. Tolerating well at this time.  RN aware.

## 2012-07-03 NOTE — Progress Notes (Signed)
Patient ID: [name], male   DOB: 01/26/1932, 79 y.o.   MRN: [id number] Subjective/Complaints: I lost 40lb during hospital stay, Left leg always bigger than right.Continent with frequent voids yesterday A 12 point review of systems has been performed and if not noted above is otherwise negative.   Objective: Vital Signs: Blood pressure 118/60, pulse 70, temperature 98.5 F (36.9 C), temperature source Oral, resp. rate 17, weight 167.5 kg (369 lb 4.3 oz), SpO2 91.00%. No results found. No results found for this basename: WBC, HGB, HCT, PLT,  in the last 72 hours  Recent Labs  07/01/12 0726 07/02/12 0639  NA 139 137  K 3.7 3.7  CL 97 98  GLUCOSE 145* 141*  BUN 54* 52*  CREATININE 1.81* 1.83*  CALCIUM 9.5 9.2   CBG (last 3)   Recent Labs  07/02/12 1641 07/02/12 2111 07/03/12 0726  GLUCAP 137* 145* 141*    Wt Readings from Last 3 Encounters:  07/03/12 167.5 kg (369 lb 4.3 oz)  06/29/12 168.693 kg (371 lb 14.4 oz)  06/23/12 170.099 kg (375 lb)    Physical Exam:  Constitutional: He is oriented to person, place, and time. Wearing CPAP/nasal 79 year old morbidly obese white male.  Eyes:  Left eye enucleated Neck: Neck supple. No thyromegaly present.  Cardiovascular:  Cardiac rate controlled. No murmurs rubs or gallops, regular  Pulmonary/Chest:  Decreased breath sounds at the bases without wheeze. No distress  Abdominal: Soft. Bowel sounds are normal. He exhibits no distension. There is no tenderness.  Musculoskeletal:  3+ pitting edema left leg. 2+ right leg. Chronic stasis changes in either leg. Left knee limited with A and P flexion due to pain. Right knee appears less affected.  Neurological: He is alert and oriented to person, place, and time.  Follows simple commands. Moves all 4's. No focal weakness although inhibition with pain and lmitations due to severe edema in each leg. UE grossly 4/5. Very HOH  Skin: Skin is warm and dry. Flaking/indurated  legs Psychiatric: He has a normal mood and affect. His behavior is normal. Judgment and thought content normal  Assessment/Plan: 1. Functional deficits secondary to deconditioning related to diastolic CHF and multiple medical issues which require 3+ hours per day of interdisciplinary therapy in a comprehensive inpatient rehab setting. Physiatrist is providing close team supervision and 24 hour management of active medical problems listed below. Physiatrist and rehab team continue to assess barriers to discharge/monitor patient progress toward functional and medical goals.     FIM: FIM - Bathing Bathing Steps Patient Completed: Chest;Right Arm;Left Arm;Abdomen;Front perineal area;Right upper leg;Left upper leg;Right lower leg (including foot);Left lower leg (including foot) Bathing: 4: Min-Patient completes 8-9 54f 10 parts or 75+ percent  FIM - Upper Body Dressing/Undressing Upper body dressing/undressing steps patient completed: Thread/unthread right sleeve of pullover shirt/dresss;Thread/unthread left sleeve of pullover shirt/dress;Put head through opening of pull over shirt/dress;Pull shirt over trunk Upper body dressing/undressing: 5: Set-up assist to: Obtain clothing/put away FIM - Lower Body Dressing/Undressing Lower body dressing/undressing steps patient completed: Thread/unthread right underwear leg;Thread/unthread left underwear leg;Pull underwear up/down;Pull pants up/down;Thread/unthread left pants leg;Thread/unthread right pants leg;Don/Doff right shoe;Fasten/unfasten right shoe Lower body dressing/undressing: 3: Mod-Patient completed 50-74% of tasks  FIM - Toileting Toileting steps completed by patient: Adjust clothing prior to toileting Toileting: 4: Steadying assist  FIM - Diplomatic Services operational officer Devices: Bedside commode Toilet Transfers: 4-To toilet/BSC: Min A (steadying Pt. > 75%);4-From toilet/BSC: Min A (steadying Pt. > 75%)  FIM - Bed/Chair  Transport planner Devices: Therapist, occupational: 5: Supine > Sit: Supervision (verbal cues/safety issues);4: Bed > Chair or W/C: Min A (steadying Pt. > 75%)  FIM - Locomotion: Wheelchair Locomotion: Wheelchair: 1: Travels less than 50 ft with supervision, cueing or coaxing FIM - Locomotion: Ambulation Locomotion: Ambulation Assistive Devices: Designer, industrial/product Ambulation/Gait Assistance: 5: Supervision Locomotion: Ambulation: 2: Travels 50 - 149 ft with minimal assistance (Pt.>75%)  Comprehension Comprehension Mode: Auditory Comprehension: 5-Understands basic 90% of the time/requires cueing < 10% of the time  Expression Expression Mode: Verbal Expression: 6-Expresses complex ideas: With extra time/assistive device  Social Interaction Social Interaction: 6-Interacts appropriately with others with medication or extra time (anti-anxiety, antidepressant).  Problem Solving Problem Solving: 6-Solves complex problems: With extra time  Memory Memory: 5-Recognizes or recalls 90% of the time/requires cueing < 10% of the time  Medical Problem List and Plan:  1. Physical deconditioning related to diastolic congestive heart failure and multiple medical issues.  2. DVT Prophylaxis/Anticoagulation: Subcutaneous heparin. Monitor platelet counts any signs of bleeding.  3. Pain Management: Neurontin 300 mg each bedtime, Lidoderm patch as needed hydrocodone 1 tablet every 6 hours as needed pain.  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  4. Neuropsych: This patient is capable of making decisions on his/her own behalf.  5. Diastolic congestive heart failure. Significant diuresis on acute. Followup per cardiology services   Diuretics currently on hold pending volume/electrolyte balance.   -daily weights, down sl to 167.5kg today  -continue potassium replacement  -serial bmets   -consider resuming diuretics tomorrow 6. History of trauma with left eye  blindness left upper extremity disability  7. Diabetes mellitus with peripheral neuropathy. Hemoglobin A1c 7.6.   Check blood sugars a.c. and at bedtime. Titrated lantus to 45u bid with some improvement yesterday--observe today 8. Sleep apnea. Continue CPAP  9. CAD/PSVT. Continue aspirin therapy. No chest pain or shortness of breath. Followup cardiology services 10. Urine retention. Check UA (+WBCs) and culture. OOB to void/ double voids LOS (Days) 4 A FACE TO FACE EVALUATION WAS PERFORMED  Erick Colace 07/03/2012 7:48 AM

## 2012-07-03 NOTE — Progress Notes (Signed)
Occupational Therapy Session Note  Patient Details  Name: [name] MRN: [id number] Date of Birth: [birth year]  Today's Date: 07/03/2012 Time: 1415-1450 Time Calculation (min): 35 min  Short Term Goals: Week 1:  OT Short Term Goal 1 (Week 1): Short Term Goals = Long Term Goals  Skilled Therapeutic Interventions/Progress Updates:    Patient seen for OT session this pm to address functional mobility, namely tub/shower transfer.  Discussed potential bathroom modifications per patients request.  Patient prefers tub transfer bench with suction cup feet for increased stability on tub floor.  Wife present for this session as well.  Wife walks with cane.  Therapy Documentation Precautions:  Precautions Precautions: Fall Precaution Comments: obese Restrictions Weight Bearing Restrictions: No Pain: Cramping in backs of thighs with wheelchair propulsion  See FIM for current functional status  Therapy/Group: Individual Therapy  Collier Salina 07/03/2012, 3:27 PM

## 2012-07-03 NOTE — Progress Notes (Signed)
Subjective:  Doing well. Impressed with prior diuresis. No increase in SOB. No CP. Diuretics on hold with increased creat. 1.1-1.8  Objective:  Vital Signs in the last 24 hours: Temp:  [98.1 F (36.7 C)-98.5 F (36.9 C)] 98.5 F (36.9 C) (03/08 0443) Pulse Rate:  [64-70] 70 (03/08 0443) Resp:  [17-18] 17 (03/08 0443) BP: (118-130)/(50-61) 130/60 mmHg (03/08 0801) SpO2:  [91 %-94 %] 91 % (03/08 0443) Weight:  [167.5 kg (369 lb 4.3 oz)] 167.5 kg (369 lb 4.3 oz) (03/08 0443)  Intake/Output from previous day: 03/07 0701 - 03/08 0700 In: 1020 [P.O.:1020] Out: 1850 [Urine:1850]   Physical Exam: General: Morbidly obese. Sitting in chair No resp difficulty  HEENT: normal except for blind in L eye  Neck: supple. JVP hard to see Carotids 2+ bilat; no bruits. No lymphadenopathy or thryomegaly appreciated.  Cor: PMI nonpalpable. Regular rate & rhythm. No rubs, gallops or murmurs.  Lungs: clear  Abdomen: morbidly obese, soft, nontender, + distended. . No bruits or masses. Good bowel sounds.  Extremities: no cyanosis, clubbing, rash, RLL 1+ LLE 3+ edema severe chronic venous stasis changes.  Neuro: alert & orientedx3, cranial nerves grossly intact. moves all 4 extremities w/o difficulty. Affect pleasant       Lab Results:   Recent Labs  07/01/12 0726 07/02/12 0639  NA 139 137  K 3.7 3.7  CL 97 98  CO2 31 29  GLUCOSE 145* 141*  BUN 54* 52*  CREATININE 1.81* 1.83*   Cardiac Studies:  Normal EF.   Assessment/Plan:  1. Acute Diastolic Heart Failure  2. Morbid Obesity  3. OSA  4. HTN  5. Arthritis  6. DMII  7. CAD  8. Fibromyalgia  9. L eye blind  10. Severe deconditioning  11. Hypokalemia  12. Hearing loss- over the last year  13. Acute renal failure 14. Edema - no DVT (assymetrical)    -Reviewed Dr. Jodean Lima note. Agree with adding back diuretics tomorrow (Torsemide 60mg  PO QD). Creat will likely stabilize and we are willing to accept increased value. Weight  continues to decrease. Will need maintenance diuretic. Discussed importance of fluid and salt restriction.   -BP well controlled with labetalol.   -Simvastatin for HL.   -ASA for CAD.   Improving. Will follow.    SKAINS, MARK 07/03/2012, 12:39 PM

## 2012-07-04 ENCOUNTER — Inpatient Hospital Stay (HOSPITAL_COMMUNITY): Payer: Medicare Other

## 2012-07-04 LAB — GLUCOSE, CAPILLARY
Glucose-Capillary: 131 mg/dL — ABNORMAL HIGH (ref 70–99)
Glucose-Capillary: 177 mg/dL — ABNORMAL HIGH (ref 70–99)
Glucose-Capillary: 199 mg/dL — ABNORMAL HIGH (ref 70–99)

## 2012-07-04 MED ORDER — TORSEMIDE 20 MG PO TABS
60.0000 mg | ORAL_TABLET | Freq: Every day | ORAL | Status: DC
  Administered 2012-07-04 – 2012-07-09 (×6): 60 mg via ORAL
  Filled 2012-07-04 (×7): qty 3

## 2012-07-04 NOTE — Progress Notes (Signed)
Patient ID: [name], male   DOB: [birth year], 79 y.o.   MRN: [id number] Subjective/Complaints: No SOB, resting comfortably with nasal CPAP, Left leg always bigger than right.Continent with frequent voids yesterday A 12 point review of systems has been performed and if not noted above is otherwise negative.   Objective: Vital Signs: Blood pressure 135/66, pulse 70, temperature 98.6 F (37 C), temperature source Oral, resp. rate 20, weight 167.5 kg (369 lb 4.3 oz), SpO2 96.00%. No results found. No results found for this basename: WBC, HGB, HCT, PLT,  in the last 72 hours  Recent Labs  07/02/12 0639  NA 137  K 3.7  CL 98  GLUCOSE 141*  BUN 52*  CREATININE 1.83*  CALCIUM 9.2   CBG (last 3)   Recent Labs  07/03/12 1146 07/03/12 1647 07/03/12 2107  GLUCAP 139* 172* 182*    Wt Readings from Last 3 Encounters:  07/03/12 167.5 kg (369 lb 4.3 oz)  06/29/12 168.693 kg (371 lb 14.4 oz)  06/23/12 170.099 kg (375 lb)    Physical Exam:  Constitutional: He is oriented to person, place, and time. Wearing CPAP/nasal 79 year old morbidly obese white male.  Eyes:  Left eye enucleated Neck: Neck supple. No thyromegaly present.  Cardiovascular:  Cardiac rate controlled. No murmurs rubs or gallops, regular  Pulmonary/Chest:  Decreased breath sounds at the bases without wheeze. No distress  Abdominal: Soft. Bowel sounds are normal. He exhibits no distension. There is no tenderness.  Musculoskeletal:  3+ pitting edema left leg. 2+ right leg. Chronic stasis changes in either leg. Left knee limited with A and P flexion due to pain. Right knee appears less affected.  Neurological: He is alert and oriented to person, place, and time.  Follows simple commands. Moves all 4's. No focal weakness although inhibition with pain and lmitations due to severe edema in each leg. UE grossly 4/5. Very HOH  Skin: Skin is warm and dry. Flaking/indurated legs Psychiatric: He has a normal mood and  affect. His behavior is normal. Judgment and thought content normal  Assessment/Plan: 1. Functional deficits secondary to deconditioning related to diastolic CHF and multiple medical issues which require 3+ hours per day of interdisciplinary therapy in a comprehensive inpatient rehab setting. Physiatrist is providing close team supervision and 24 hour management of active medical problems listed below. Physiatrist and rehab team continue to assess barriers to discharge/monitor patient progress toward functional and medical goals.     FIM: FIM - Bathing Bathing Steps Patient Completed: Chest;Right Arm;Left Arm;Abdomen;Front perineal area;Right upper leg;Left upper leg Bathing: 3: Mod-Patient completes 5-7 94f 10 parts or 50-74%  FIM - Upper Body Dressing/Undressing Upper body dressing/undressing steps patient completed: Thread/unthread right sleeve of pullover shirt/dresss;Thread/unthread left sleeve of pullover shirt/dress;Put head through opening of pull over shirt/dress;Pull shirt over trunk Upper body dressing/undressing: 5: Set-up assist to: Obtain clothing/put away FIM - Lower Body Dressing/Undressing Lower body dressing/undressing steps patient completed: Thread/unthread right pants leg;Thread/unthread left pants leg;Pull pants up/down;Fasten/unfasten pants;Don/Doff right shoe Lower body dressing/undressing: 3: Mod-Patient completed 50-74% of tasks  FIM - Toileting Toileting steps completed by patient: Adjust clothing prior to toileting;Adjust clothing after toileting Toileting Assistive Devices: Grab bar or rail for support Toileting: 3: Mod-Patient completed 2 of 3 steps  FIM - Diplomatic Services operational officer Devices: Elevated toilet seat;Grab bars Toilet Transfers: 4-To toilet/BSC: Min A (steadying Pt. > 75%);4-From toilet/BSC: Min A (steadying Pt. > 75%)  FIM - Bed/Chair Transfer Bed/Chair Transfer Assistive Devices: Manufacturing systems engineer Transfer: 6:  Supine > Sit: No  assist;5: Bed > Chair or W/C: Supervision (verbal cues/safety issues)  FIM - Locomotion: Wheelchair Locomotion: Wheelchair: 1: Travels less than 50 ft with supervision, cueing or coaxing FIM - Locomotion: Ambulation Locomotion: Ambulation Assistive Devices: Designer, industrial/product Ambulation/Gait Assistance: 5: Supervision Locomotion: Ambulation: 2: Travels 50 - 149 ft with minimal assistance (Pt.>75%)  Comprehension Comprehension Mode: Auditory Comprehension: 7-Follows complex conversation/direction: With no assist  Expression Expression Mode: Verbal Expression: 7-Expresses complex ideas: With no assist  Social Interaction Social Interaction: 7-Interacts appropriately with others - No medications needed.  Problem Solving Problem Solving: 5-Solves basic problems: With no assist  Memory Memory: 5-Recognizes or recalls 90% of the time/requires cueing < 10% of the time  Medical Problem List and Plan:  1. Physical deconditioning related to diastolic congestive heart failure and multiple medical issues.  2. DVT Prophylaxis/Anticoagulation: Subcutaneous heparin. Monitor platelet counts any signs of bleeding.  3. Pain Management: Neurontin 300 mg each bedtime, Lidoderm patch as needed hydrocodone 1 tablet every 6 hours as needed pain.  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  4. Neuropsych: This patient is capable of making decisions on his/her own behalf.  5. Diastolic congestive heart failure. Significant diuresis on acute. Followup per cardiology services   Diuretics currently on hold pending volume/electrolyte balance.   -daily weights, down sl to 167.5kg today  -continue potassium replacement  -serial bmets   -consider resuming diuretics tomorrow 6. 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 Diabetes mellitus with peripheral neuropathy. Hemoglobin A1c 7.6.   Check blood sugars a.c. and at bedtime. Titrated lantus to 45u bid  with some improvement yesterday--observe today 8. Sleep apnea. Continue Nasal CPAP 9. CAD/PSVT. Continue aspirin therapy. No chest pain or shortness of breath. Followup cardiology services 10. Urine retention. Check UA (+WBCs) and culture. OOB to void/ double voids LOS (Days) 5 A FACE TO FACE EVALUATION WAS PERFORMED  Erick Colace 07/04/2012 7:52 AM

## 2012-07-04 NOTE — Progress Notes (Signed)
Wrote for torsemide 60mg  PO QD. Maintenance diuretic.   Please call with any questions.

## 2012-07-05 ENCOUNTER — Inpatient Hospital Stay (HOSPITAL_COMMUNITY): Payer: Medicare Other

## 2012-07-05 ENCOUNTER — Inpatient Hospital Stay (HOSPITAL_COMMUNITY): Payer: Medicare Other | Admitting: Occupational Therapy

## 2012-07-05 DIAGNOSIS — E669 Obesity, unspecified: Secondary | ICD-10-CM

## 2012-07-05 DIAGNOSIS — R5381 Other malaise: Secondary | ICD-10-CM

## 2012-07-05 DIAGNOSIS — M1710 Unilateral primary osteoarthritis, unspecified knee: Secondary | ICD-10-CM

## 2012-07-05 DIAGNOSIS — I509 Heart failure, unspecified: Secondary | ICD-10-CM

## 2012-07-05 LAB — GLUCOSE, CAPILLARY
Glucose-Capillary: 112 mg/dL — ABNORMAL HIGH (ref 70–99)
Glucose-Capillary: 145 mg/dL — ABNORMAL HIGH (ref 70–99)
Glucose-Capillary: 149 mg/dL — ABNORMAL HIGH (ref 70–99)

## 2012-07-05 LAB — BASIC METABOLIC PANEL
BUN: 38 mg/dL — ABNORMAL HIGH (ref 6–23)
Chloride: 101 mEq/L (ref 96–112)
GFR calc Af Amer: 40 mL/min — ABNORMAL LOW (ref >90)
Potassium: 4.5 mEq/L (ref 3.5–5.1)
Sodium: 137 mEq/L (ref 135–145)

## 2012-07-05 NOTE — Progress Notes (Signed)
Patient ID: [name], male   DOB: [birth year], 79 y.o.   MRN: [id number] Subjective/Complaints: No new issues. Slept fairly well. Denies pain. A 12 point review of systems has been performed and if not noted above is otherwise negative.   Objective: Vital Signs: Blood pressure 124/54, pulse 58, temperature 98 F (36.7 C), temperature source Oral, resp. rate 16, weight 168.3 kg (371 lb 0.6 oz), SpO2 94.00%. No results found. No results found for this basename: WBC, HGB, HCT, PLT,  in the last 72 hours No results found for this basename: NA, K, CL, CO, GLUCOSE, BUN, CREATININE, CALCIUM,  in the last 72 hours CBG (last 3)   Recent Labs  07/04/12 1701 07/04/12 2114 07/05/12 0709  GLUCAP 177* 199* 112*    Wt Readings from Last 3 Encounters:  07/04/12 168.3 kg (371 lb 0.6 oz)  06/29/12 168.693 kg (371 lb 14.4 oz)  06/23/12 170.099 kg (375 lb)    Physical Exam:  Constitutional: He is oriented to person, place, and time. Wearing CPAP/nasal 79 year old morbidly obese white male.  Eyes:  Left eye enucleated Neck: Neck supple. No thyromegaly present.  Cardiovascular:  Cardiac rate controlled. No murmurs rubs or gallops, regular  Pulmonary/Chest:  Decreased breath sounds at the bases without wheeze. No distress  Abdominal: Soft. Bowel sounds are normal. He exhibits no distension. There is no tenderness.  Musculoskeletal:  3+ pitting edema left leg. 2+ right leg. Chronic stasis changes in either leg. Left knee limited with A and P flexion due to pain. Right knee appears less affected.  Neurological: He is alert and oriented to person, place, and time.  Follows simple commands. Moves all 4's. No focal weakness although inhibition with pain and lmitations due to severe edema in each leg. UE grossly 4/5. Very HOH  Skin: Skin is warm and dry. Flaking/indurated legs Psychiatric: He has a normal mood and affect. His behavior is normal. Judgment and thought content  normal  Assessment/Plan: 1. Functional deficits secondary to deconditioning related to diastolic CHF and multiple medical issues which require 3+ hours per day of interdisciplinary therapy in a comprehensive inpatient rehab setting. Physiatrist is providing close team supervision and 24 hour management of active medical problems listed below. Physiatrist and rehab team continue to assess barriers to discharge/monitor patient progress toward functional and medical goals.     FIM: FIM - Bathing Bathing Steps Patient Completed: Chest;Right Arm;Left Arm;Abdomen;Front perineal area;Right upper leg;Left upper leg Bathing: 3: Mod-Patient completes 5-7 27f 10 parts or 50-74%  FIM - Upper Body Dressing/Undressing Upper body dressing/undressing steps patient completed: Thread/unthread right sleeve of pullover shirt/dresss;Thread/unthread left sleeve of pullover shirt/dress;Put head through opening of pull over shirt/dress;Pull shirt over trunk Upper body dressing/undressing: 5: Set-up assist to: Obtain clothing/put away FIM - Lower Body Dressing/Undressing Lower body dressing/undressing steps patient completed: Thread/unthread right pants leg;Thread/unthread left pants leg;Pull pants up/down;Fasten/unfasten pants;Don/Doff right shoe Lower body dressing/undressing: 3: Mod-Patient completed 50-74% of tasks  FIM - Toileting Toileting steps completed by patient: Adjust clothing prior to toileting;Adjust clothing after toileting Toileting Assistive Devices: Grab bar or rail for support Toileting: 3: Mod-Patient completed 2 of 3 steps  FIM - Diplomatic Services operational officer Devices: Grab bars;Walker Toilet Transfers: 4-To toilet/BSC: Min A (steadying Pt. > 75%);4-From toilet/BSC: Min A (steadying Pt. > 75%)  FIM - Bed/Chair Transfer Bed/Chair Transfer Assistive Devices: Therapist, occupational: 4: Sit > Supine: Min A (steadying pt. > 75%/lift 1 leg)  FIM - Locomotion:  Wheelchair Locomotion: Wheelchair: 1:  Travels less than 50 ft with supervision, cueing or coaxing FIM - Locomotion: Ambulation Locomotion: Ambulation Assistive Devices: Designer, industrial/product Ambulation/Gait Assistance: 5: Supervision Locomotion: Ambulation: 2: Travels 50 - 149 ft with minimal assistance (Pt.>75%)  Comprehension Comprehension Mode: Auditory Comprehension: 7-Follows complex conversation/direction: With no assist  Expression Expression Mode: Verbal Expression: 7-Expresses complex ideas: With no assist  Social Interaction Social Interaction: 7-Interacts appropriately with others - No medications needed.  Problem Solving Problem Solving: 5-Solves basic problems: With no assist  Memory Memory: 5-Recognizes or recalls 90% of the time/requires cueing < 10% of the time  Medical Problem List and Plan:  1. Physical deconditioning related to diastolic congestive heart failure and multiple medical issues.  2. DVT Prophylaxis/Anticoagulation: Subcutaneous heparin. Monitor platelet counts any signs of bleeding.  3. Pain Management: Neurontin 300 mg each bedtime, Lidoderm patch as needed hydrocodone 1 tablet every 6 hours as needed pain.  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  4. Neuropsych: This patient is capable of making decisions on his/her own behalf.  5. Diastolic congestive heart failure. Significant diuresis on acute. Followup per cardiology services   Diuretics currently on hold pending volume/electrolyte balance.   -daily weights, 168kg  -continue potassium replacement  -serial bmets   -back on demadex. 6. 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 Diabetes mellitus with peripheral neuropathy. Hemoglobin A1c 7.6.   Check blood sugars a.c. and at bedtime. Titrated lantus to 45u bid with some improvement yesterday--tolerable at present 8. Sleep apnea. Continue Nasal CPAP 9. CAD/PSVT. Continue aspirin therapy. No  chest pain or shortness of breath. Followup cardiology services 10. Urine retention. Check UA (+WBCs) and culture. OOB to void/ double voids LOS (Days) 6 A FACE TO FACE EVALUATION WAS PERFORMED  SWARTZ,ZACHARY T 07/05/2012 7:49 AM

## 2012-07-05 NOTE — Progress Notes (Signed)
Advanced Heart Failure Rounding Note   Subjective:    [name] is a 43 year with a PMH of morbid obesity, chronic diastolic heart failure EF 50% (previous EF 60-65%), HTN, SVT/PSVT/PAT, CAD, DMII, OSA, arthritis, fibromyalgia, and hyperlipidemia.   [name] says that he has gained 100 pounds over the last year. Progressive dyspnea and edema over the last month. Over the last few months he has transitioned from a cane, to a walker, and now uses wheelchair. Uses CPAP at night. He also reports drinking sports drinks suggested by his pharmacist which aided in weight gain. Does not weigh at home. Lives with his wife. Limited family support.   Admitted from [facility] office with increased lower extremity edema despite increase outpatient diuretics by his PCP. +orthopnea and PND Prior to admit Demadex was increased to 100 mg twice a day with poor response.  Diuresed ~40 pounds and transferred to CIR on 3/4.  No DVT per LE dopplers.   Weight fluctuating between 369-371 pounds on torsemide 60 mg daily.  BMET pending.  Working with rehab and says it is rough on he knees.  He says breathing is at baseline.  Denies orthopnea/PND.  No chest pain.    Objective:    Vital Signs:   Temp:  [97.8 F (36.6 C)-98 F (36.7 C)] 98 F (36.7 C) (03/10 0558) Pulse Rate:  [58-71] 58 (03/10 0558) Resp:  [16-22] 16 (03/10 0558) BP: (124-130)/(54-90) 124/54 mmHg (03/10 0558) SpO2:  [94 %-97 %] 94 % (03/10 0558) Weight:  [371 lb 0.6 oz (168.3 kg)] 371 lb 0.6 oz (168.3 kg) (03/09 2122) Last BM Date: 07/03/12  Weight change: Filed Weights   07/02/12 0527 07/03/12 0443 07/04/12 2122  Weight: 371 lb 4.1 oz (168.4 kg) 369 lb 4.3 oz (167.5 kg) 371 lb 0.6 oz (168.3 kg)    Intake/Output:   Intake/Output Summary (Last 24 hours) at 07/05/12 1001 Last data filed at 07/05/12 0700  Gross per 24 hour  Intake   1320 ml  Output   1375 ml  Net    -55 ml     Physical Exam: General: Morbidly obese. Sitting in chair  No resp difficulty HEENT: normal except for blind in L eye  Neck: supple. JVP hard to see Carotids 2+ bilat; no bruits. No lymphadenopathy or thryomegaly appreciated.  Cor: PMI nonpalpable. Regular rate & rhythm. No rubs, gallops or murmurs.  Lungs: clear  Abdomen: morbidly obese, soft, nontender, + distended. . No bruits or masses. Good bowel sounds.  Extremities: no cyanosis, clubbing, rash, RLL tr-1+ LLE 3+ edema severe chronic venous stasis changes.  Neuro: alert & orientedx3, cranial nerves grossly intact. moves all 4 extremities w/o difficulty. Affect pleasant   Telemetry: SR  Labs: Basic Metabolic Panel:  Recent Labs Lab 06/29/12 0919 06/29/12 1827 06/30/12 0725 07/01/12 0726 07/02/12 0639  NA 137  --  140 139 137  K 3.1*  --  3.3* 3.7 3.7  CL 91*  --  95* 97 98  CO2 34*  --  33* 31 29  GLUCOSE 235*  --  140* 145* 141*  BUN 51*  --  54* 54* 52*  CREATININE 1.89* 2.10* 1.83* 1.81* 1.83*  CALCIUM 9.4  --  9.3 9.5 9.2    Liver Function Tests:  Recent Labs Lab 06/30/12 0725  AST 37  ALT 17  ALKPHOS 67  BILITOT 0.9  PROT 6.5  ALBUMIN 3.4*   No results found for this basename: LIPASE, AMYLASE,  in the  last 168 hours No results found for this basename: AMMONIA,  in the last 168 hours  CBC:  Recent Labs Lab 06/29/12 1827 06/30/12 0725  WBC 5.9 4.8  NEUTROABS  --  2.4  HGB 12.1* 10.9*  HCT 34.7* 31.4*  MCV 94.0 92.4  PLT 128* 103*     Medications:     Scheduled Medications: . aspirin  162 mg Oral Daily  . cholecalciferol  5,000 Units Oral Daily  . fluticasone  2 spray Each Nare Daily  . gabapentin  300 mg Oral QHS  . heparin  5,000 Units Subcutaneous Q8H  . insulin aspart  0-20 Units Subcutaneous TID WC  . insulin glargine  45 Units Subcutaneous BID  . labetalol  200 mg Oral BID  . latanoprost  1 drop Right Eye QHS  . loratadine  10 mg Oral QPM  . montelukast  10 mg Oral QHS  . multivitamin with minerals  1 tablet Oral Daily  . potassium  chloride  40 mEq Oral BID  . simvastatin  40 mg Oral QHS  . tamsulosin  0.4 mg Oral QHS  . torsemide  60 mg Oral Daily    Infusions:    PRN Medications: acetaminophen, HYDROcodone-acetaminophen, lidocaine, ondansetron (ZOFRAN) IV, ondansetron, sorbitol   Assessment:   1. Acute Diastolic Heart Failure  2. Morbid Obesity  3. OSA  4. HTN  5. Arthritis  6. DMII  7. CAD  8. Fibromyalgia  9. L eye blind  10. Severe deconditioning  11. Hypokalemia  12. Hearing loss- over the last year 13. Acute renal failure  Plan/Discussion:    Weight fluctuating between 369-371 pounds on torsemide 60 mg daily.  Will await BMET to assess renal function.  Continue current therapy at this time.  I worry about dietary compliance once he gets home. Will need to follow closely as outpatient.   Is there any utility in wrapping LLE for lymphedema?  Appreciate CIR's assistance.    Length of Stay: 6  Daniel Bensimhon,MD 10:02 AM

## 2012-07-05 NOTE — Progress Notes (Signed)
Physical Therapy Session Note  Patient Details  Name: [name] MRN: [id number] Date of Birth: [birth year]  Today's Date: 07/05/2012 Time: [phone] (45 min)  Short Term Goals: Week 1:  PT Short Term Goal 1 (Week 1): = LTGs  Skilled Therapeutic Interventions/Progress Updates:    Simulated car transfer x 2 reps with RW with S for van height; discussed safety recommendations (and putting seat as far back to make it easier to get LE's into the car) but pt able to bring LE's in/out independently. Gait with RW for endurance and strengthening with S; cues for posture. Seated therex for LE strengthening including heel and toe raises and LAQ x 10 reps each bilaterally. Multiple rest breaks needed due to increased work of breathing.  Therapy Documentation Precautions:  Precautions Precautions: Fall Precaution Comments: obese Restrictions Weight Bearing Restrictions: No   Pain: No complaints.   Locomotion : Ambulation Ambulation/Gait Assistance: 5: Supervision   See FIM for current functional status  Therapy/Group: Individual Therapy  [name] [facility] 07/05/2012, 10:09 AM

## 2012-07-05 NOTE — Progress Notes (Signed)
Physical Therapy Session Note  Patient Details  Name: [name] MRN: [id number] Date of Birth: Apr 16, 1932  Today's Date: 07/05/2012 Time: [id number] Time Calculation (min): 57 min  Short Term Goals: Week 1:  PT Short Term Goal 1 (Week 1): = LTGs  Skilled Therapeutic Interventions/Progress Updates:   Treatment focused on dynamic balance and gait with RW to collect horseshoes at varying heights and then pitch horseshoes to work on standing tolerance. Pt with increased work of breathing with activities but O2 sats remained . 95%. S for sit to stands and transfers as well as gait; cues for positioning of walker and posture during gait. Seated therex on Kinetron increasing levels of resistance x 20 reps bilaterally.   Therapy Documentation Precautions:  Precautions Precautions: Fall Precaution Comments: obese Restrictions Weight Bearing Restrictions: No  Pain: No complaints  See FIM for current functional status  Therapy/Group: Individual Therapy  [name] [facility] 07/05/2012, 3:45 PM

## 2012-07-05 NOTE — Progress Notes (Signed)
Occupational Therapy Session Notes  Patient Details  Name: [name] MRN: [id number] Date of Birth: 1931/11/08  Today's Date: 07/05/2012  Short Term Goals: Week 1:  OT Short Term Goal 1 (Week 1): Short Term Goals = Long Term Goals  Skilled Therapeutic Interventions/Progress Updates:   Session #1 [phone] - 20 Minutes Individual Therapy No complaints of pain Patient found supine in bed with CPAP machine donned. Therapist turned off machine and patient engaged in bed mobility to sit edge of bed for breakfast. While patient ate breakfast discussed d/c information. Patient then ambulated from edge of bed -> elevated toilet seat in bathroom for toileting needs. Patient then ambulated -> w/c seated at sink side for ADL retraining. Focused skilled intervention on functional mobility/ambulation using rolling walker, grooming tasks seated at sink (disucssed sitting at home for grooming tasks), UB/LB bathing & dressing, sit<>stands, dynamic sitting balance/tolerance/endurance, and overall activity tolerance/endurance. At end of session left patient seated in w/c beside bed with call bell & phone within reach.   Session #2 1135-1200 - 25 Minutes Individual Therapy No complaints of pain Patient found seated in bed side chair. Patient engaged in functional ambulation from chair -> w/c seated outside of room. Therapist then propelled patient from room -> tub room for simulated tub/shower transfer on/off tub transfer bench. Patient ambulated out of bathroom and down hallway using rolling walker just past ADL apartment. Therapist propelled patient back to room and patient ambulated from w/c (seated outside room door) -> bed side chair. Patient left seated with call bell & phone within reach.   Precautions:  Precautions Precautions: Fall Precaution Comments: obese Restrictions Weight Bearing Restrictions: No  See FIM for current functional status  CLAY,PATRICIA 07/05/2012, 7:42 AM

## 2012-07-06 ENCOUNTER — Inpatient Hospital Stay (HOSPITAL_COMMUNITY): Payer: Medicare Other | Admitting: Occupational Therapy

## 2012-07-06 ENCOUNTER — Inpatient Hospital Stay (HOSPITAL_COMMUNITY): Payer: Medicare Other

## 2012-07-06 LAB — GLUCOSE, CAPILLARY
Glucose-Capillary: 129 mg/dL — ABNORMAL HIGH (ref 70–99)
Glucose-Capillary: 154 mg/dL — ABNORMAL HIGH (ref 70–99)

## 2012-07-06 LAB — BASIC METABOLIC PANEL
CO2: 24 mEq/L (ref 19–32)
Calcium: 9.1 mg/dL (ref 8.4–10.5)
Creatinine, Ser: 1.82 mg/dL — ABNORMAL HIGH (ref 0.50–1.35)
GFR calc Af Amer: 39 mL/min — ABNORMAL LOW (ref >90)
GFR calc non Af Amer: 34 mL/min — ABNORMAL LOW (ref >90)
Sodium: 138 mEq/L (ref 135–145)

## 2012-07-06 NOTE — Progress Notes (Signed)
Patient ID: [name], male   DOB: [birth year], 79 y.o.   MRN: [id number] Subjective/Complaints: Had questions about follow up for knees. Slept better last night.  A 12 point review of systems has been performed and if not noted above is otherwise negative.   Objective: Vital Signs: Blood pressure 113/60, pulse 72, temperature 98.2 F (36.8 C), temperature source Oral, resp. rate 20, weight 165 kg (363 lb 12.1 oz), SpO2 95.00%. No results found. No results found for this basename: WBC, HGB, HCT, PLT,  in the last 72 hours  Recent Labs  07/05/12 1251  NA 137  K 4.5  CL 101  GLUCOSE 184*  BUN 38*  CREATININE 1.78*  CALCIUM 9.2   CBG (last 3)   Recent Labs  07/05/12 1700 07/05/12 2122 07/06/12 0709  GLUCAP 145* 149* 134*    Wt Readings from Last 3 Encounters:  07/06/12 165 kg (363 lb 12.1 oz)  06/29/12 168.693 kg (371 lb 14.4 oz)  06/23/12 170.099 kg (375 lb)    Physical Exam:  Constitutional: He is oriented to person, place, and time. Wearing CPAP/nasal 79 year old morbidly obese white male.  Eyes:  Left eye enucleated Neck: Neck supple. No thyromegaly present.  Cardiovascular:  Cardiac rate controlled. No murmurs rubs or gallops, regular  Pulmonary/Chest:  Decreased breath sounds at the bases without wheeze. No distress  Abdominal: Soft. Bowel sounds are normal. He exhibits no distension. There is no tenderness.  Musculoskeletal:  3+ pitting edema left leg. 2+ right leg. Chronic stasis changes in either leg. Left knee limited with A and P flexion due to pain. Right knee appears less affected.  Neurological: He is alert and oriented to person, place, and time.  Follows simple commands. Moves all 4's. No focal weakness although inhibition with pain and lmitations due to severe edema in each leg. UE grossly 4/5. Very HOH  Skin: Skin is warm and dry. Flaking/indurated legs Psychiatric: He has a normal mood and affect. His behavior is normal. Judgment and thought  content normal  Assessment/Plan: 1. Functional deficits secondary to deconditioning related to diastolic CHF and multiple medical issues which require 3+ hours per day of interdisciplinary therapy in a comprehensive inpatient rehab setting. Physiatrist is providing close team supervision and 24 hour management of active medical problems listed below. Physiatrist and rehab team continue to assess barriers to discharge/monitor patient progress toward functional and medical goals.     FIM: FIM - Bathing Bathing Steps Patient Completed: Chest;Right Arm;Left Arm;Abdomen;Front perineal area;Buttocks;Right upper leg;Left upper leg Bathing: 4: Min-Patient completes 8-9 30f 10 parts or 75+ percent  FIM - Upper Body Dressing/Undressing Upper body dressing/undressing steps patient completed: Thread/unthread right sleeve of pullover shirt/dresss;Thread/unthread left sleeve of pullover shirt/dress;Put head through opening of pull over shirt/dress;Pull shirt over trunk Upper body dressing/undressing: 5: Set-up assist to: Obtain clothing/put away FIM - Lower Body Dressing/Undressing Lower body dressing/undressing steps patient completed: Thread/unthread right underwear leg;Thread/unthread left underwear leg;Pull underwear up/down;Thread/unthread right pants leg;Thread/unthread left pants leg;Pull pants up/down Lower body dressing/undressing: 3: Mod-Patient completed 50-74% of tasks  FIM - Toileting Toileting steps completed by patient: Adjust clothing prior to toileting;Performs perineal hygiene;Adjust clothing after toileting Toileting Assistive Devices: Grab bar or rail for support Toileting: 5: Supervision: Safety issues/verbal cues  FIM - Diplomatic Services operational officer Devices: Elevated toilet seat Toilet Transfers: 5-To toilet/BSC: Supervision (verbal cues/safety issues);5-From toilet/BSC: Supervision (verbal cues/safety issues)  FIM - Banker  Devices: Manufacturing systems engineer Transfer: 4: Sit > Supine:  Min A (steadying pt. > 75%/lift 1 leg)  FIM - Locomotion: Wheelchair Locomotion: Wheelchair: 1: Total Assistance/staff pushes wheelchair (Pt<25%) FIM - Locomotion: Ambulation Locomotion: Ambulation Assistive Devices: Designer, industrial/product Ambulation/Gait Assistance: 5: Supervision Locomotion: Ambulation: 1: Travels less than 50 ft with supervision/safety issues  Comprehension Comprehension Mode: Auditory Comprehension: 7-Follows complex conversation/direction: With no assist  Expression Expression Mode: Verbal Expression: 7-Expresses complex ideas: With no assist  Social Interaction Social Interaction: 7-Interacts appropriately with others - No medications needed.  Problem Solving Problem Solving: 7-Solves complex problems: Recognizes & self-corrects  Memory Memory: 7-Complete Independence: No helper  Medical Problem List and Plan:  1. Physical deconditioning related to diastolic congestive heart failure and multiple medical issues.  2. DVT Prophylaxis/Anticoagulation: Subcutaneous heparin. Monitor platelet counts any signs of bleeding.  3. Pain Management: Neurontin 300 mg each bedtime, Lidoderm patch as needed hydrocodone 1 tablet every 6 hours as needed pain.  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   -discussed surgical intervention, which, if any, would be down the line a bit. 4. Neuropsych: This patient is capable of making decisions on his/her own behalf.  5. Diastolic congestive heart failure. Significant diuresis on acute. Followup per cardiology services   Diuretics currently on hold pending volume/electrolyte balance.   -daily weights, 165kg today  -continue potassium replacement  -serial bmets   -back on demadex.  -can attempt to wrap LLE to see if we can help edema control. 6. 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 Diabetes mellitus with peripheral  neuropathy. Hemoglobin A1c 7.6.   Check blood sugars a.c. and at bedtime. Titrated lantus to 45u bid with some improvement yesterday--reasonable at present 8. Sleep apnea. Continue Nasal CPAP 9. CAD/PSVT. Continue aspirin therapy. No chest pain or shortness of breath. Followup cardiology services 10. Urine retention. Check UA (+WBCs) and culture. OOB to void/ double voids LOS (Days) 7 A FACE TO FACE EVALUATION WAS PERFORMED  SWARTZ,ZACHARY T 07/06/2012 7:51 AM

## 2012-07-06 NOTE — Progress Notes (Signed)
Occupational Therapy Session Note  Patient Details  Name: TABB CROGHAN MRN: [id number] Date of Birth: October 31, 1931  Today's Date: 07/06/2012 Time: 1191-4782 Time Calculation (min): 55 min  Short Term Goals: Week 1:  OT Short Term Goal 1 (Week 1): Short Term Goals = Long Term Goals  Skilled Therapeutic Interventions/Progress Updates:  Patient found supine in bed. Patient engaged in bed mobility and ambulated from edge of bed -> elevated toilet seat for toileting needs -> BSC seated in shower stall. Patient then performed UB/LB bathing with minimal assistance in sit-> stand position using grab bars prn. Patient then ambulated out of bathroom -> sink side seated in w/c for grooming tasks at sink. UB/LB dressing performed, then therapist donned ace wrap -> LLE per Drs order. At end of session, patient left seated in w/c to maneuver around room prn. Skilled intervention focused on overall activity tolerance/endurance, bed mobility, functional mobility using rolling walker, ADL transfer, grooming tasks seated at sink, UB/LB bating & dressing, sit<>stands, and dynamic standing.   Precautions:  Precautions Precautions: Fall Precaution Comments: obese Restrictions Weight Bearing Restrictions: No  See FIM for current functional status  Therapy/Group: Individual Therapy  CLAY,PATRICIA 07/06/2012, 9:35 AM

## 2012-07-06 NOTE — Patient Care Conference (Signed)
Inpatient RehabilitationTeam Conference and Plan of Care Update Date: 07/06/2012   Time: 2:20 PM    Patient Name: [name]      Medical Record Number: [id number]  Date of Birth: 07-22-1931 Sex: Male         Room/Bed: 4006/4006-01 Payor Info: Payor: MEDICARE  Plan: MEDICARE PART A AND B  Product Type: *No Product type*     Admitting Diagnosis: Deconditioned CHF  Admit Date/Time:  06/29/2012  4:01 PM Admission Comments: No comment available   Primary Diagnosis:  Physical deconditioning Principal Problem: Physical deconditioning  Patient Active Problem List   Diagnosis Date Noted  . Physical deconditioning 06/30/2012  . Acute renal failure 06/28/2012  . Acute on chronic diastolic heart failure 10/08/2010  . MORBID OBESITY 05/30/2009  . ESSENTIAL HYPERTENSION, BENIGN 05/30/2009  . CAD, NATIVE VESSEL 02/03/2009  . SVT/ PSVT/ PAT 02/03/2009  . OTHER AND UNSPECIFIED HYPERLIPIDEMIA 12/26/2008    Expected Discharge Date: Expected Discharge Date: 07/09/12  Team Members Present: Physician leading conference: Dr. Faith Rogue Social Worker Present: Amada Jupiter, LCSW Nurse Present: Daryll Brod, RN PT Present: [name], PT;Other (comment) Sherrine Maples, PT) OT Present: Mackie Pai, OT;Patricia Mat Carne, OT Other (Discipline and Name): Ottie Glazier, RN     Current Status/Progress Goal Weekly Team Focus  Medical   deconditioned after chf and multiple medical issues  increase stamina, mobility, control pain  fluid balance, maintain renal function   Bowel/Bladder   Continent of bowel and bladder. LBM 07/03/12         Swallow/Nutrition/ Hydration             ADL's   min assist -> supervision  overall mod I  ADL retraining, use of AE prn, dynamic standing, activity tolerance/endurance, ADL transfers   Mobility   S/min A  overall mod I except min A car and stairs  sit to stands, activity tolerance/endurance, functional strengthening, balance training, up/down ramp    Communication             Safety/Cognition/ Behavioral Observations            Pain   Vicodin x 1 tab q 4hrs prn  <3  Monitor for effectiveness   Skin   Bruising to L forearm and abdomen. Skin tear to L forearm. Cellulitis to LLE. Skin mole to anterior chest, posterior back with intermittent redness. BLE edema  No additional skin breakdown       Rehab Goals Patient on target to meet rehab goals: Yes *See Interdisciplinary Assessment and Plan and progress notes for long and short-term goals  Barriers to Discharge: none    Possible Resolutions to Barriers:  n/a    Discharge Planning/Teaching Needs:  home with wife who can only provide supervision      Team Discussion:  Making good gains.  Very talkative!  Anticipate mod i goals to be reached.  No concerns.  Revisions to Treatment Plan:  none   Continued Need for Acute Rehabilitation Level of Care: The patient requires daily medical management by a physician with specialized training in physical medicine and rehabilitation for the following conditions: Daily direction of a multidisciplinary physical rehabilitation program to ensure safe treatment while eliciting the highest outcome that is of practical value to the patient.: Yes Daily medical management of patient stability for increased activity during participation in an intensive rehabilitation regime.: Yes Daily analysis of laboratory values and/or radiology reports with any subsequent need for medication adjustment of medical intervention for : Other;Cardiac problems (  pain issues, obesity)  [name], [name] 07/06/2012, 6:25 PM

## 2012-07-06 NOTE — Progress Notes (Signed)
Physical Therapy Session Note  Patient Details  Name: [name] MRN: [id number] Date of Birth: Jul 18, 1931  Today's Date: 07/06/2012 Time: 4098-1191 Time Calculation (min): 45 min  Short Term Goals: Week 1:  PT Short Term Goal 1 (Week 1): = LTGs  Skilled Therapeutic Interventions/Progress Updates:   Treatment focused on gait training with RW for endurance and strengthening x 90', up/down ramp x 2 with RW for simulated home entry (cues needed due to decreased depth perception from visual deficits), and w/c mobility on unit using bilateral UE's and LE's with extra time for endurance training.  Therapy Documentation Precautions:  Precautions Precautions: Fall Precaution Comments: obese Restrictions Weight Bearing Restrictions: No Pain:  No complaints. Locomotion : Ambulation Ambulation/Gait Assistance: 5: Supervision   See FIM for current functional status  Therapy/Group: Individual Therapy  [name] [facility] 07/06/2012, 10:19 AM

## 2012-07-06 NOTE — Progress Notes (Signed)
Patient placed himself on cpap. Patient is tolerating cpap well at this time. RT will continue to monitor. 

## 2012-07-06 NOTE — Progress Notes (Addendum)
Physical Therapy Session Note  Patient Details  Name: [name] MRN: [id number] Date of Birth: 1931/08/01  Today's Date: 07/06/2012 Time: 4098-1191 and 1300-1405 Time Calculation (min): 27 min and 65 min  Short Term Goals: Week 1:  PT Short Term Goal 1 (Week 1): = LTGs    Skilled Therapeutic Interventions/Progress Updates:  AM tx- Gait with RW x 55' x 2 with supervision, therapist following closely with w/c.  Focus on upright posture, forward gaze, activity tolerance.  Therapeutic exercise performed with bil LE to increase strength for functional mobility, in standing at railing in hall: 1 x 10 each- calf raises; attempted alternating open chain hip abduction in standing.  Pt able to perform x 2 on L, x 1 on R, due to L knee pain with brief SLS.  Sit>< stand at wall bar x 3 during session, with min guard assist for safety.  PM tx- Pt reported LLE ACE wraps seemed to be loose.  Therapist checked; ACE was slightly bunched at ankle; adjusted.  Kinetron at 25 cm/sec in sitting in w/c, x 25 cycles x 2; at 20 cm/sec x 25 cycles x 6, with 1 minute rest breaks between each set, timed by pt.  Sit>< stand with supervision.  Gait as above x 60' x 1, x 65' x 1.  Advanced gait with RW sideways and backwards in home setting congested area, x 4' each direction, supervision.  Standing with RW> recliner in room in order to elevate LEs ; (L ankle with marked crease due to frequent positioning in passive DF resting on footrest of w/c.)  Wife in room, and all needs within reach of pt.    Therapy Documentation Precautions:  Precautions Precautions: Fall Precaution Comments: obese Restrictions Weight Bearing Restrictions: No   Pain: Pain Assessment Pain Assessment: No/denies pain   Locomotion : Ambulation Ambulation/Gait Assistance: 5: Supervision     See FIM for current functional status  Therapy/Group: Individual Therapy  [name] 07/06/2012, 12:19 PM

## 2012-07-06 NOTE — Progress Notes (Signed)
Occupational Therapy Session Note  Patient Details  Name: [name] MRN: [id number] Date of Birth: [birth year]  Today's Date: 07/03/2012 Time: 0800-0910     Skilled Therapeutic Interventions/Progress Updates:    (Late entry) Patient seen for OT individual session to address functional mobility, balance, and activity tolerance as related to basic self care tasks.  Patient ambulated from bed to shower and utilized tub shower bench to allow him to sit throughout the task.  Patient unable to reach feet while seated in walk in shower.  Patient utilized grab bars for sit to stand.  Heavilly dependent on upper extremities for this transition while seated on bench.    Therapy Documentation Precautions:  Precautions Precautions: Fall Precaution Comments: obese Restrictions Weight Bearing Restrictions: No General:   Vital Signs: Therapy Vitals Temp: 98.2 F (36.8 C) Temp src: Oral Pulse Rate: 72 Resp: 20 BP: 113/60 mmHg Patient Position, if appropriate: Lying Oxygen Therapy SpO2: 95 % O2 Device: CPAP O2 Flow Rate (L/min): 2 L/min Pain:   ADL:   Exercises:   Other Treatments:    See FIM for current functional status  Therapy/Group: Individual Therapy  Collier Salina 07/06/2012, 8:54 AM

## 2012-07-06 NOTE — Progress Notes (Signed)
Advanced Heart Failure Rounding Note   Subjective:    [name] is a 67 year with a PMH of morbid obesity, chronic diastolic heart failure EF 50% (previous EF 60-65%), HTN, SVT/PSVT/PAT, CAD, DMII, OSA, arthritis, fibromyalgia, and hyperlipidemia.   [name] says that he has gained 100 pounds over the last year. Progressive dyspnea and edema over the last month. Over the last few months he has transitioned from a cane, to a walker, and now uses wheelchair. Uses CPAP at night. He also reports drinking sports drinks suggested by his pharmacist which aided in weight gain. Does not weigh at home. Lives with his wife. Limited family support.   Admitted from [facility] office with increased lower extremity edema despite increase outpatient diuretics by his PCP. +orthopnea and PND Prior to admit Demadex was increased to 100 mg twice a day with poor response.  Diuresed ~40 pounds and transferred to CIR on 3/4.  No DVT per LE dopplers.   Weight fluctuating between 369-371 pounds on torsemide 60 mg daily.  Cr 1.78.  Rehab going well.  He says breathing is at baseline.  Denies orthopnea/PND.  No chest pain.    Objective:    Vital Signs:   Temp:  [98.1 F (36.7 C)-98.2 F (36.8 C)] 98.2 F (36.8 C) (03/11 0530) Pulse Rate:  [67-72] 72 (03/11 0530) Resp:  [18-20] 20 (03/11 0530) BP: (113-147)/(56-60) 113/60 mmHg (03/11 0530) SpO2:  [95 %] 95 % (03/11 0530) FiO2 (%):  [28 %] 28 % (03/10 2312) Weight:  [165 kg (363 lb 12.1 oz)] 165 kg (363 lb 12.1 oz) (03/11 0647) Last BM Date: 07/03/12  Weight change: Filed Weights   07/03/12 0443 07/04/12 2122 07/06/12 0647  Weight: 167.5 kg (369 lb 4.3 oz) 168.3 kg (371 lb 0.6 oz) 165 kg (363 lb 12.1 oz)    Intake/Output:   Intake/Output Summary (Last 24 hours) at 07/06/12 0938 Last data filed at 07/06/12 0900  Gross per 24 hour  Intake    960 ml  Output    690 ml  Net    270 ml     Physical Exam: General: Morbidly obese. Sitting in chair No resp  difficulty HEENT: normal except for blind in L eye  Neck: supple. JVP hard to see Carotids 2+ bilat; no bruits. No lymphadenopathy or thryomegaly appreciated.  Cor: PMI nonpalpable. Regular rate & rhythm. No rubs, gallops or murmurs.  Lungs: clear  Abdomen: morbidly obese, soft, nontender, + distended. . No bruits or masses. Good bowel sounds.  Extremities: no cyanosis, clubbing, rash, RLL tr-1+ LLE 3+ edema severe chronic venous stasis changes.  Neuro: alert & orientedx3, cranial nerves grossly intact. moves all 4 extremities w/o difficulty. Affect pleasant   Telemetry: SR  Labs: Basic Metabolic Panel:  Recent Labs Lab 06/30/12 0725 07/01/12 0726 07/02/12 0639 07/05/12 1251 07/06/12 0638  NA 140 139 137 137 138  K 3.3* 3.7 3.7 4.5 4.1  CL 95* 97 98 101 105  CO2 33* 31 29 23 24   GLUCOSE 140* 145* 141* 184* 145*  BUN 54* 54* 52* 38* 38*  CREATININE 1.83* 1.81* 1.83* 1.78* 1.82*  CALCIUM 9.3 9.5 9.2 9.2 9.1    Liver Function Tests:  Recent Labs Lab 06/30/12 0725  AST 37  ALT 17  ALKPHOS 67  BILITOT 0.9  PROT 6.5  ALBUMIN 3.4*   No results found for this basename: LIPASE, AMYLASE,  in the last 168 hours No results found for this basename: AMMONIA,  in  the last 168 hours  CBC:  Recent Labs Lab 06/29/12 1827 06/30/12 0725  WBC 5.9 4.8  NEUTROABS  --  2.4  HGB 12.1* 10.9*  HCT 34.7* 31.4*  MCV 94.0 92.4  PLT 128* 103*     Medications:     Scheduled Medications: . aspirin  162 mg Oral Daily  . cholecalciferol  5,000 Units Oral Daily  . fluticasone  2 spray Each Nare Daily  . gabapentin  300 mg Oral QHS  . heparin  5,000 Units Subcutaneous Q8H  . insulin aspart  0-20 Units Subcutaneous TID WC  . insulin glargine  45 Units Subcutaneous BID  . labetalol  200 mg Oral BID  . latanoprost  1 drop Right Eye QHS  . loratadine  10 mg Oral QPM  . montelukast  10 mg Oral QHS  . multivitamin with minerals  1 tablet Oral Daily  . potassium chloride  40 mEq Oral  BID  . simvastatin  40 mg Oral QHS  . tamsulosin  0.4 mg Oral QHS  . torsemide  60 mg Oral Daily    Infusions:    PRN Medications: acetaminophen, HYDROcodone-acetaminophen, lidocaine, ondansetron (ZOFRAN) IV, ondansetron, sorbitol   Assessment:   1. Acute Diastolic Heart Failure  2. Morbid Obesity  3. OSA  4. HTN  5. Arthritis  6. DMII  7. CAD  8. Fibromyalgia  9. L eye blind  10. Severe deconditioning  11. Hypokalemia  12. Hearing loss- over the last year 13. Acute renal failure  Plan/Discussion:    Volume status looks good on torsemide 60 mg daily.  BMET stable.  Will need to follow closely as outpatient.   Plan for legs to be wrapped today.    Appreciate CIR's assistance.    Length of Stay: 7  Daniel Bensimhon,MD 9:38 AM

## 2012-07-07 ENCOUNTER — Inpatient Hospital Stay (HOSPITAL_COMMUNITY): Payer: Medicare Other

## 2012-07-07 ENCOUNTER — Inpatient Hospital Stay (HOSPITAL_COMMUNITY): Payer: Medicare Other | Admitting: Occupational Therapy

## 2012-07-07 ENCOUNTER — Inpatient Hospital Stay (HOSPITAL_COMMUNITY): Payer: Medicare Other | Admitting: Physical Therapy

## 2012-07-07 DIAGNOSIS — E669 Obesity, unspecified: Secondary | ICD-10-CM

## 2012-07-07 DIAGNOSIS — M1710 Unilateral primary osteoarthritis, unspecified knee: Secondary | ICD-10-CM

## 2012-07-07 DIAGNOSIS — IMO0002 Reserved for concepts with insufficient information to code with codable children: Secondary | ICD-10-CM

## 2012-07-07 DIAGNOSIS — I509 Heart failure, unspecified: Secondary | ICD-10-CM

## 2012-07-07 DIAGNOSIS — R5381 Other malaise: Secondary | ICD-10-CM

## 2012-07-07 LAB — GLUCOSE, CAPILLARY
Glucose-Capillary: 102 mg/dL — ABNORMAL HIGH (ref 70–99)
Glucose-Capillary: 120 mg/dL — ABNORMAL HIGH (ref 70–99)
Glucose-Capillary: 139 mg/dL — ABNORMAL HIGH (ref 70–99)

## 2012-07-07 NOTE — Progress Notes (Signed)
Physical Therapy Session Note  Patient Details  Name: [name] MRN: [id number] Date of Birth: 1932/04/26  Today's Date: 07/07/2012 Time: 0930-1030 Time Calculation (min): 60 min  Short Term Goals: Week 1:  PT Short Term Goal 1 (Week 1): = LTGs  Skilled Therapeutic Interventions/Progress Updates:   Pt's family brought in personal rollator which pt used outdoors PTA;  Armed forces logistics/support/administrative officer on rollator states weight limit is 275 lb and pt's weight is greater than 360 lb, so recommended to pt for safety reasons of weight limit not to use rollator. Pt reports the RW was ordered for his wife after she had back surgery. Discussed with pt ordering a bariatric RW for his personal use, and pt in agreement and understanding. Discussed with Child psychotherapist as well.   Stair training for community mobility training up/down 5 steps with heavy min A. Kinetron in seated position increasing resistance each set x 20 reps from resistance 30 down to 10. Gait training with RW with emphasis on distance for endurance training x 105' and then x 20' with seated rest break.   Therapy Documentation Precautions:  Precautions Precautions: Fall Precaution Comments: obese Restrictions Weight Bearing Restrictions: No    Pain:  No complaints.  See FIM for current functional status  Therapy/Group: Individual Therapy  [name] [facility] 07/07/2012, 10:32 AM

## 2012-07-07 NOTE — Progress Notes (Signed)
Patient ID: [name], male   DOB: 1932-04-05, 79 y.o.   MRN: [id number] Subjective/Complaints: No new complaints. Breathing comfortably. Wearing ACE was a little difficult with some tightness and pain around ankle  A 12 point review of systems has been performed and if not noted above is otherwise negative.   Objective: Vital Signs: Blood pressure 131/74, pulse 73, temperature 97.7 F (36.5 C), temperature source Other (Comment), resp. rate 17, weight 173.1 kg (381 lb 9.9 oz), SpO2 94.00%. No results found. No results found for this basename: WBC, HGB, HCT, PLT,  in the last 72 hours  Recent Labs  07/05/12 1251 07/06/12 0638  NA 137 138  K 4.5 4.1  CL 101 105  GLUCOSE 184* 145*  BUN 38* 38*  CREATININE 1.78* 1.82*  CALCIUM 9.2 9.1   CBG (last 3)   Recent Labs  07/06/12 1619 07/06/12 2058 07/07/12 0703  GLUCAP 149* 154* 102*    Wt Readings from Last 3 Encounters:  07/07/12 173.1 kg (381 lb 9.9 oz)  06/29/12 168.693 kg (371 lb 14.4 oz)  06/23/12 170.099 kg (375 lb)    Physical Exam:  Constitutional: He is oriented to person, place, and time. Wearing CPAP/nasal 79 year old morbidly obese white male.  Eyes:  Left eye enucleated Neck: Neck supple. No thyromegaly present.  Cardiovascular:  Cardiac rate controlled. No murmurs rubs or gallops, regular  Pulmonary/Chest:  Decreased breath sounds at the bases without wheeze. No distress  Abdominal: Soft. Bowel sounds are normal. He exhibits no distension. There is no tenderness.  Musculoskeletal:  3+ pitting edema left leg. 2+ right leg. Chronic stasis changes in either leg. Left knee limited with A and P flexion due to pain. Right knee appears less affected.  Neurological: He is alert and oriented to person, place, and time.  Follows simple commands. Moves all 4's. No focal weakness although inhibition with pain and lmitations due to severe edema in each leg. UE grossly 4/5. Very HOH  Skin: Skin is warm and dry.  Flaking/indurated legs Psychiatric: He has a normal mood and affect. His behavior is normal. Judgment and thought content normal  Assessment/Plan: 1. Functional deficits secondary to deconditioning related to diastolic CHF and multiple medical issues which require 3+ hours per day of interdisciplinary therapy in a comprehensive inpatient rehab setting. Physiatrist is providing close team supervision and 24 hour management of active medical problems listed below. Physiatrist and rehab team continue to assess barriers to discharge/monitor patient progress toward functional and medical goals.     FIM: FIM - Bathing Bathing Steps Patient Completed: Chest;Right Arm;Left Arm;Abdomen;Front perineal area;Buttocks;Right upper leg;Left upper leg Bathing: 4: Min-Patient completes 8-9 52f 10 parts or 75+ percent  FIM - Upper Body Dressing/Undressing Upper body dressing/undressing steps patient completed: Thread/unthread right sleeve of pullover shirt/dresss;Thread/unthread left sleeve of pullover shirt/dress;Put head through opening of pull over shirt/dress;Pull shirt over trunk Upper body dressing/undressing: 5: Set-up assist to: Obtain clothing/put away FIM - Lower Body Dressing/Undressing Lower body dressing/undressing steps patient completed: Thread/unthread right underwear leg;Thread/unthread left underwear leg;Pull underwear up/down;Thread/unthread right pants leg;Thread/unthread left pants leg;Pull pants up/down Lower body dressing/undressing: 3: Mod-Patient completed 50-74% of tasks  FIM - Toileting Toileting steps completed by patient: Adjust clothing prior to toileting;Performs perineal hygiene;Adjust clothing after toileting Toileting Assistive Devices: Grab bar or rail for support Toileting: 6: Assistive device: No helper  FIM - Diplomatic Services operational officer Devices: Elevated toilet seat;Walker Toilet Transfers: 6-Assistive device: No helper  FIM - Best boy  Transfer Assistive Devices: Therapist, occupational: 4: Sit > Supine: Min A (steadying pt. > 75%/lift 1 leg)  FIM - Locomotion: Wheelchair Locomotion: Wheelchair: 5: Travels 50 - 149 ft, turns around, maneuvers to table, bed or toilet, negotiates 3% grade: modified independent FIM - Locomotion: Ambulation Locomotion: Ambulation Assistive Devices: Designer, industrial/product Ambulation/Gait Assistance: 5: Supervision Locomotion: Ambulation: 2: Travels 50 - 149 ft with supervision/safety issues  Comprehension Comprehension Mode: Auditory Comprehension: 6-Follows complex conversation/direction: With extra time/assistive device  Expression Expression Mode: Verbal Expression: 7-Expresses complex ideas: With no assist  Social Interaction Social Interaction: 7-Interacts appropriately with others - No medications needed.  Problem Solving Problem Solving: 7-Solves complex problems: Recognizes & self-corrects  Memory Memory: 7-Complete Independence: No helper  Medical Problem List and Plan:  1. Physical deconditioning related to diastolic congestive heart failure and multiple medical issues.  2. DVT Prophylaxis/Anticoagulation: Subcutaneous heparin. Monitor platelet counts any signs of bleeding.  3. Pain Management: Neurontin 300 mg each bedtime, Lidoderm patch as needed hydrocodone 1 tablet every 6 hours as needed pain.  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   -discussed surgical intervention, which, if any, would be down the line a bit. 4. Neuropsych: This patient is capable of making decisions on his/her own behalf.  5. Diastolic congestive heart failure. Significant diuresis on acute. Followup per cardiology services   Diuretics currently on hold pending volume/electrolyte balance.   -daily weights, I question validity of weight today---re weigh  -continue potassium replacement  -serial bmets   -continue demadex.  -can attempt to wrap LLE to see  if we can help edema control. 6. 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 Diabetes mellitus with peripheral neuropathy. Hemoglobin A1c 7.6.   Check blood sugars a.c. and at bedtime. Titrated lantus to 45u bid with some improvement yesterday--reasonable at present 8. Sleep apnea. Continue Nasal CPAP 9. CAD/PSVT. Continue aspirin therapy. No chest pain or shortness of breath. Followup cardiology services 10. Urine retention. ucx negative LOS (Days) 8 A FACE TO FACE EVALUATION WAS PERFORMED  Ronette Hank T 07/07/2012 7:58 AM

## 2012-07-07 NOTE — Progress Notes (Signed)
Occupational Therapy Note  Patient Details  Name: [name] MRN: [id number] Date of Birth: 1931/06/14 Today's Date: 07/07/2012  Time: 1400-1430 Pt denies pain Individual Therapy  Pt seated in w/c with wife present.  Wife present throughout session but continued to fall asleep throughout and did not interact.  Pt engaged in dynamic standing activities while engaging BUE in functional tasks.  Pt required steady A for sit<>stand and while standing.  Pt engaged in functional amb with RW in room for home mgmt tasks.  Walker bad and Scientist, water quality.   Lavone Neri [facility] 07/07/2012, 3:28 PM

## 2012-07-07 NOTE — Progress Notes (Signed)
Social Work Patient ID: [name], male   DOB: 1931-06-13, 79 y.o.   MRN: [id number]  Have reviewed team conference with pt and wife - both aware and agreeable with targeted d/c date 3/14 @ mod i goals.  Discussed follow up services and DME needs.  Pt also notes that his son will be providing d/c transportation and will need to have him out by 9 am - will share with PA.  HOYLE, LUCY, LCSW

## 2012-07-07 NOTE — Progress Notes (Signed)
Physical Therapy Session Note  Patient Details  Name: [name] MRN: [id number] Date of Birth: 1932/04/11  Today's Date: 07/07/2012 Time: [id number] Time Calculation (min): 34 min  Short Term Goals: Week 1:  PT Short Term Goal 1 (Week 1): = LTGs  Skilled Therapeutic Interventions/Progress Updates:   Transfers w/c to NuStep with min@.  Performed NuStep on workload=4, x 7 min., per patient's request.  Pt easily SOB, but continued to talk during the whole session.   Therapy Documentation Precautions:  Precautions Precautions: Fall Precaution Comments: obese Restrictions Weight Bearing Restrictions: No Pain: No complaints of pain.  See FIM for current functional status  Therapy/Group: Individual Therapy  Georges Mouse 07/07/2012, 3:49 PM

## 2012-07-07 NOTE — Progress Notes (Signed)
Occupational Therapy Session Note  Patient Details  Name: [name] MRN: [id number] Date of Birth: 08/16/1931  Today's Date: 07/07/2012  Short Term Goals: Week 1:  OT Short Term Goal 1 (Week 1): Short Term Goals = Long Term Goals  Skilled Therapeutic Interventions/Progress Updates:   Session #1 [phone] - 33 Minutes Individual Therapy No complaints of pain Patient found seated edge of bed after eating breakfast. Patient educated on use of reacher and then he doffed bilateral socks with reacher. He then ambulated -> elevated toilet seat in bathroom for toileting, then to [facility] seated in walk-in shower for UB/LB bathing at shower level. Patient ambulated out of shower-> w/c for UB/LB dressing and grooming tasks seated at sink. Focused skilled intervention on functional ambulation/mobility with use of rolling walker, sit<>stands, dynamic standing balance/tolerance/endurance, grooming tasks seated in w/c, ADL transfers, and overall activity tolerance/endurance. At end of session patient left seated in w/c with call bell & phone within reach.   Session #2 1130-1200 - 30 Minutes Individual Therapy No complaints of pain Patient found seated in w/c. Patient propelled self from room -> nurses station, therapist then propelled patient -> ADL apartment. Focused skilled intervention on functional ambulation in apartment/kitchen using rolling walker, safety with rolling walker, simple kitchen task of getting item out of refrigerator, and overall activity tolerance/endurance. Discussed importance of not carrying items in hands while walking with rolling walker. Plan to administer walker bag for safety. Therapist propelled patient back to room at end of session. Left patient seated in w/c with call bell, phone, and lunch tray within reach.   Precautions:  Precautions Precautions: Fall Precaution Comments: obese Restrictions Weight Bearing Restrictions: No  See FIM for current functional  status  CLAY,PATRICIA 07/07/2012, 7:52 AM

## 2012-07-08 ENCOUNTER — Inpatient Hospital Stay (HOSPITAL_COMMUNITY): Payer: Medicare Other | Admitting: Occupational Therapy

## 2012-07-08 ENCOUNTER — Inpatient Hospital Stay (HOSPITAL_COMMUNITY): Payer: Medicare Other

## 2012-07-08 LAB — GLUCOSE, CAPILLARY
Glucose-Capillary: 149 mg/dL — ABNORMAL HIGH (ref 70–99)
Glucose-Capillary: 107 mg/dL — ABNORMAL HIGH (ref 70–99)

## 2012-07-08 NOTE — Progress Notes (Signed)
  Weight stable. Renal function stable. Continue current regimen. Will get BMET tomorrow prior to d/c. Schedule f/u in HF clinic next week.   Appreciate CIR's care.   Daniel Bensimhon,MD 7:55 AM

## 2012-07-08 NOTE — Progress Notes (Signed)
Physical Therapy Discharge Summary  Patient Details  Name: [name] MRN: [id number] Date of Birth: [birth year]  Today's Date: 07/08/2012  Session #1: Time: [phone] Time Calculation (min): 60 min Individual therapy; L knee a little sore. Gait with RW down to therapy gym x 120' mod I to fatigue; pt out of breath and required rest break. Mod I transfers and bed mobility to test strength and sensation on the mat. Seated therex including LAQ with 3 sec hold, heel raises, toe raises, and seated marches with 2-3# ankle weights x 2 sets of 10 reps each bilaterally. W/c mobility back to room mod I with extra time using LEs and UEs. Pt made mod I in room with RW.  Session #2: Time: 1350-1430 (40 min) Individual therapy; Reports feeling tired this PM. Focused on therex for functional strengthening including UE ergometer x 10 min (5 min forward, 5 min backwards) on random program and seated using Kinetron x 20 reps x 4 sets increasing resistance. Adjusted personal walker to appropriate height and gait on unit mod I x 50'. Pt reports feeling prepared for d/c tomorrow.  Patient has met 10 of 10 long term goals due to improved activity tolerance, improved balance, increased strength, increased range of motion and decreased pain.  Patient to discharge at an ambulatory level Modified Independent household distances with RW. Pt's son and wife available to provide S assistance as needed for car transfer or ramp for home entry.  Reasons goals not met: n/a  Recommendation:  Patient will benefit from ongoing skilled PT services in home health setting to continue to advance safe functional mobility, address ongoing impairments in balance, strength, endurance, gait, edema, and minimize fall risk.  Equipment: Bariatric RW  Reasons for discharge: treatment goals met and discharge from hospital  Patient/family agrees with progress made and goals achieved: Yes  PT  Discharge Precautions/Restrictions Precautions Precautions: Fall Precaution Comments: obese Restrictions Weight Bearing Restrictions: No Vision/Perception  Vision - History Baseline Vision: Wears glasses all the time Patient Visual Report: No change from baseline Vision - Assessment Eye Alignment: Impaired (comment) (same as admission, left eye impaired;right eye WFL) Perception Perception: Impaired (depth perception impaired due to L visual deficit) Praxis Praxis: Intact  Cognition Overall Cognitive Status: Appears within functional limits for tasks assessed Arousal/Alertness: Awake/alert Orientation Level: Oriented X4 Memory: Appears intact Awareness: Appears intact Problem Solving: Appears intact Safety/Judgment: Appears intact Sensation Sensation Light Touch: Appears Intact Additional Comments: bilateral UEs appear intact Coordination Gross Motor Movements are Fluid and Coordinated: Yes Fine Motor Movements are Fluid and Coordinated: Yes Motor  Motor Motor: Within Functional Limits    Locomotion  Gait Gait Pattern: Wide base of support;Trunk flexed  Trunk/Postural Assessment  Cervical Assessment Cervical Assessment: Within Functional Limits Thoracic Assessment Thoracic Assessment: Exceptions to [facility] (same as admission) Lumbar Assessment Lumbar Assessment: Exceptions to [facility] (same as admission) Postural Control Postural Control: Within Functional Limits  Balance Static Sitting Balance Static Sitting - Level of Assistance: 7: Independent Dynamic Sitting Balance Dynamic Sitting - Level of Assistance: 6: Modified independent (Device/Increase time) Static Standing Balance Static Standing - Level of Assistance: 6: Modified independent (Device/Increase time) (with RW) Dynamic Standing Balance Dynamic Standing - Level of Assistance: 6: Modified independent (Device/Increase time) (with RW) Extremity Assessment  RUE Assessment RUE Assessment: Within Functional  Limits LUE Assessment LUE Assessment: Exceptions to WFL LUE AROM (degrees) LUE Overall AROM Comments: same as admission; decreased AROM -> wrist and fingers secondary to accident in 1971 LUE PROM (degrees) LUE  Overall PROM Comments: same as admission; decreased PROM -> wrist and fingers secondary to accident in 1971 RLE Assessment RLE Assessment:  (grossly 4-/5; limited at ankles due to edema (L > R)) LLE Assessment LLE Assessment: Exceptions to [facility] (3+/5 grossly; edema at ankle limited ROM L > R)  See FIM for current functional status  [name] [facility] 07/08/2012, 9:59 AM

## 2012-07-08 NOTE — Progress Notes (Signed)
Pt. Placed himself on home CPAP.  Pt. Resting comfortably at this time. RN aware.

## 2012-07-08 NOTE — Progress Notes (Signed)
Occupational Therapy Session Notes & Discharge Summary  Patient Details  Name: [name] MRN: [id number] Date of Birth: 1932/02/06  Today's Date: 07/08/2012  SESSION NOTES  Session #1 [id number] - 55 Minutes Individual Therapy No complaints of pain Patient found supine in bed. Patient engaged in bed mobility at mod I level to sit edge of bed and eat breakfast. While patient ate breakfast therapist discussed d/c planning. Patient then ambulated -> sink side for ADL at sink level in sit<>stand position and grooming tasks seated at sink. Focused skilled intervention on UB/LB bathing & dressing, sit<>stands, dynamic standing balance/tolerance/endurance, UB/LB bathing & dressing, grooming tasks seated at sink, functional ambulation/mobility using rolling walker, and overall activity tolerance/endurance. Patient left seated in w/c at end of session with call bell & phone within reach.   Session #2 1130-1200 - 30 Minutes Individual Therapy No complaints of pain Patient found seated in w/c. Patient propelled self out into hallway. Therapist then propelled patient from room -> tub room for tub/shower transfer on/off tub transfer bench. Patient then ambulated from tub room -> ADL apartment for furniture transfer. Patient sat in w/c and therapist propelled patient -> therapy gym for UE exercise using SCIFIT machine. At end of session, patient left seated in w/c with call bell & phone within reach.    ----------------------------------------------------------------------------------------------  DISCHARGE SUMMARY Patient has met 10 of 10 long term goals due to improved activity tolerance, improved balance, postural control, ability to compensate for deficits, improved attention, improved awareness and improved coordination.  Patient to discharge at overall Modified Independent level.  Patient's care partner is independent to provide the necessary supervision prn and min assist for donning shoes  assistance at discharge.    Reasons goals not met: n/a, all goals met at this time.  Recommendation: No additional occupational therapy recommended at this time. Patient is mod I for BADLs and patient states his wife completes all IADLs within household.  Equipment: bariatric tub transfer bench  Reasons for discharge: treatment goals met and discharge from hospital  Patient/family agrees with progress made and goals achieved: Yes  Precautions/Restrictions  Precautions Precautions: Fall Restrictions Weight Bearing Restrictions: No  Vital Signs Therapy Vitals Temp: 97.8 F (36.6 C) Temp src: Oral Pulse Rate: 65 Resp: 19 BP: 102/65 mmHg Patient Position, if appropriate: Lying Oxygen Therapy SpO2: 95 %  Pain Pain Assessment Pain Assessment: No/denies pain Pain Score: 0-No pain Faces Pain Scale: No hurt  ADL - See FIM  Vision/Perception  Vision - History Baseline Vision: Wears glasses all the time Patient Visual Report: No change from baseline Vision - Assessment Eye Alignment: Impaired (comment) (same as admission, left eye impaired;right eye WFL) Perception Perception: Within Functional Limits Praxis Praxis: Intact   Cognition Overall Cognitive Status: Appears within functional limits for tasks assessed Arousal/Alertness: Awake/alert Orientation Level: Oriented X4 Memory: Appears intact Awareness: Appears intact Problem Solving: Appears intact Safety/Judgment: Appears intact  Sensation Sensation Light Touch: Appears Intact Additional Comments: bilateral UEs appear intact Coordination Gross Motor Movements are Fluid and Coordinated: Yes Fine Motor Movements are Fluid and Coordinated: Yes  Motor - See Discharge Navigator   Mobility - See Discharge Navigator   Trunk/Postural Assessment - See Discharge Navigator   Balance- See Discharge Navigator   Extremity/Trunk Assessment RUE Assessment RUE Assessment: Within Functional Limits LUE  Assessment LUE Assessment: Exceptions to [facility] LUE AROM (degrees) LUE Overall AROM Comments: same as admission; decreased AROM -> wrist and fingers secondary to accident in 1971 LUE PROM (degrees) LUE Overall PROM  Comments: same as admission; decreased PROM -> wrist and fingers secondary to accident in 1971  See FIM for current functional status  CLAY,PATRICIA 07/08/2012, 7:44 AM

## 2012-07-08 NOTE — Progress Notes (Signed)
Patient ID: [name], male   DOB: 02/10/1932, 79 y.o.   MRN: [id number] Subjective/Complaints: No new issues. Excited to go home.   review of systems has been performed and if not noted above is otherwise negative.   Objective: Vital Signs: Blood pressure 102/65, pulse 65, temperature 97.8 F (36.6 C), temperature source Oral, resp. rate 19, weight 170.734 kg (376 lb 6.4 oz), SpO2 95.00%. No results found. No results found for this basename: WBC, HGB, HCT, PLT,  in the last 72 hours  Recent Labs  07/05/12 1251 07/06/12 0638  NA 137 138  K 4.5 4.1  CL 101 105  GLUCOSE 184* 145*  BUN 38* 38*  CREATININE 1.78* 1.82*  CALCIUM 9.2 9.1   CBG (last 3)   Recent Labs  07/07/12 1658 07/07/12 2124 07/08/12 0710  GLUCAP 139* 174* 95    Wt Readings from Last 3 Encounters:  07/07/12 170.734 kg (376 lb 6.4 oz)  06/29/12 168.693 kg (371 lb 14.4 oz)  06/23/12 170.099 kg (375 lb)    Physical Exam:  Constitutional: He is oriented to person, place, and time. Wearing CPAP/nasal 79 year old morbidly obese white male.  Eyes:  Left eye enucleated Neck: Neck supple. No thyromegaly present.  Cardiovascular:  Cardiac rate controlled. No murmurs rubs or gallops, regular  Pulmonary/Chest:  Decreased breath sounds at the bases without wheeze. No distress  Abdominal: Soft. Bowel sounds are normal. He exhibits no distension. There is no tenderness.  Musculoskeletal:  3+ pitting edema left leg. 2+ right leg. Chronic stasis changes in either leg. Left knee limited with A and P flexion due to pain. Right knee appears less affected.  Neurological: He is alert and oriented to person, place, and time.  Follows simple commands. Moves all 4's. No focal weakness although inhibition with pain and lmitations due to severe edema in each leg. UE grossly 4/5. Very HOH  Skin: Skin is warm and dry. Flaking/indurated legs Psychiatric: He has a normal mood and affect. His behavior is normal. Judgment and  thought content normal  Assessment/Plan: 1. Functional deficits secondary to deconditioning related to diastolic CHF and multiple medical issues which require 3+ hours per day of interdisciplinary therapy in a comprehensive inpatient rehab setting. Physiatrist is providing close team supervision and 24 hour management of active medical problems listed below. Physiatrist and rehab team continue to assess barriers to discharge/monitor patient progress toward functional and medical goals.     FIM: FIM - Bathing Bathing Steps Patient Completed: Chest;Right Arm;Left Arm;Abdomen;Front perineal area;Buttocks;Right upper leg;Left upper leg Bathing: 4: Min-Patient completes 8-9 6f 10 parts or 75+ percent  FIM - Upper Body Dressing/Undressing Upper body dressing/undressing steps patient completed: Thread/unthread right sleeve of pullover shirt/dresss;Put head through opening of pull over shirt/dress;Pull shirt over trunk;Thread/unthread left sleeve of pullover shirt/dress Upper body dressing/undressing: 5: Set-up assist to: Obtain clothing/put away FIM - Lower Body Dressing/Undressing Lower body dressing/undressing steps patient completed: Thread/unthread right underwear leg;Thread/unthread left underwear leg;Pull underwear up/down;Thread/unthread right pants leg;Thread/unthread left pants leg;Pull pants up/down Lower body dressing/undressing: 4: Min-Patient completed 75 plus % of tasks  FIM - Toileting Toileting steps completed by patient: Performs perineal hygiene Toileting Assistive Devices: Grab bar or rail for support Toileting: 6: Assistive device: No helper  FIM - Diplomatic Services operational officer Devices: Elevated toilet seat Toilet Transfers: 6-Assistive device: No helper  FIM - Banker Devices: Manufacturing systems engineer Transfer: 5: Bed > Chair or W/C: Supervision (verbal cues/safety issues);5: Chair or W/C >  Bed: Supervision (verbal  cues/safety issues)  FIM - Locomotion: Wheelchair Locomotion: Wheelchair: 5: Travels 50 - 149 ft, turns around, maneuvers to table, bed or toilet, negotiates 3% grade: modified independent FIM - Locomotion: Ambulation Locomotion: Ambulation Assistive Devices: Designer, industrial/product Ambulation/Gait Assistance: 5: Supervision Locomotion: Ambulation: 2: Travels 50 - 149 ft with supervision/safety issues  Comprehension Comprehension Mode: Auditory Comprehension: 6-Follows complex conversation/direction: With extra time/assistive device  Expression Expression Mode: Verbal Expression: 7-Expresses complex ideas: With no assist  Social Interaction Social Interaction: 7-Interacts appropriately with others - No medications needed.  Problem Solving Problem Solving: 6-Solves complex problems: With extra time  Memory Memory: 6-More than reasonable amt of time  Medical Problem List and Plan:  1. Physical deconditioning related to diastolic congestive heart failure and multiple medical issues.  2. DVT Prophylaxis/Anticoagulation: Subcutaneous heparin. Monitor platelet counts any signs of bleeding.  3. Pain Management: Neurontin 300 mg each bedtime, Lidoderm patch as needed hydrocodone 1 tablet every 6 hours as needed pain.  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   -discussed surgical intervention, which, if any, would be down the line a bit. 4. Neuropsych: This patient is capable of making decisions on his/her own behalf.  5. Diastolic congestive heart failure. Significant diuresis on acute. Followup per cardiology services   Diuretics currently on hold pending volume/electrolyte balance.   -weights slightly up. See if trend continues today. May need further adjustment of diuretic before dc  -continue potassium replacement  -serial bmets   -continue demadex at same dose for now  -can attempt to wrap LLE to see if we can help edema control. 6. History of trauma with left  eye blindness left upper extremity disability  7. Diabetes mellitus with peripheral neuropathy. Hemoglobin A1c 7.6.   Check blood sugars a.c. and at bedtime. Titrated lantus to 45u bid with some improvement yesterday--reasonable at present 8. Sleep apnea. Continue Nasal CPAP 9. CAD/PSVT. Continue aspirin therapy. No chest pain or shortness of breath. Followup cardiology services 10. Urine retention. ucx negative LOS (Days) 9 A FACE TO FACE EVALUATION WAS PERFORMED  SWARTZ,ZACHARY T 07/08/2012 7:48 AM

## 2012-07-09 DIAGNOSIS — M1710 Unilateral primary osteoarthritis, unspecified knee: Secondary | ICD-10-CM

## 2012-07-09 DIAGNOSIS — E669 Obesity, unspecified: Secondary | ICD-10-CM

## 2012-07-09 DIAGNOSIS — R5381 Other malaise: Secondary | ICD-10-CM

## 2012-07-09 DIAGNOSIS — I509 Heart failure, unspecified: Secondary | ICD-10-CM

## 2012-07-09 LAB — BASIC METABOLIC PANEL
Calcium: 9 mg/dL (ref 8.4–10.5)
GFR calc Af Amer: 46 mL/min — ABNORMAL LOW (ref >90)
GFR calc non Af Amer: 40 mL/min — ABNORMAL LOW (ref >90)
Glucose, Bld: 95 mg/dL (ref 70–99)
Potassium: 4.3 mEq/L (ref 3.5–5.1)
Sodium: 143 mEq/L (ref 135–145)

## 2012-07-09 LAB — GLUCOSE, CAPILLARY: Glucose-Capillary: 84 mg/dL (ref 70–99)

## 2012-07-09 MED ORDER — LABETALOL HCL 200 MG PO TABS: 200.0000 mg | ORAL_TABLET | Freq: Two times a day (BID) | ORAL | Status: AC

## 2012-07-09 MED ORDER — HYDROCODONE-ACETAMINOPHEN 5-325 MG PO TABS: 1.0000 | ORAL_TABLET | Freq: Four times a day (QID) | ORAL | Status: DC | PRN

## 2012-07-09 MED ORDER — INSULIN GLARGINE 100 UNIT/ML ~~LOC~~ SOLN: 45.0000 [IU] | Freq: Two times a day (BID) | SUBCUTANEOUS | Status: DC

## 2012-07-09 MED ORDER — ASPIRIN 81 MG PO CHEW: 162.0000 mg | CHEWABLE_TABLET | Freq: Every day | ORAL | Status: DC

## 2012-07-09 MED ORDER — TORSEMIDE 20 MG PO TABS: 60.0000 mg | ORAL_TABLET | Freq: Every day | ORAL | Status: DC

## 2012-07-09 MED ORDER — LIDOCAINE 5 % EX PTCH: 1.0000 | MEDICATED_PATCH | Freq: Every day | CUTANEOUS | Status: DC | PRN

## 2012-07-09 MED ORDER — GABAPENTIN 300 MG PO CAPS: 300.0000 mg | ORAL_CAPSULE | Freq: Every day | ORAL | Status: DC

## 2012-07-09 NOTE — Progress Notes (Signed)
Social Work  Discharge Note  The overall goal for the admission was met for:   Discharge location: Yes - home with wife and son  Length of Stay: Yes - 10 days  Discharge activity level: Yes - mod i overall  Home/community participation: Yes  Services provided included: MD, RD, PT, OT, RN, TR, Pharmacy and SW  Financial Services: Medicare and Private Insurance: BCBS  Follow-up services arranged: Home Health: RN, PT, OT via [facility], DME: bariatric rolling walker, bari tub bench via [facility] and Patient/Family has no preference for HH/DME agencies  Comments (or additional information):  Patient/Family verbalized understanding of follow-up arrangements: Yes  Individual responsible for coordination of the follow-up plan: patient  Confirmed correct DME delivered: HOYLE, LUCY 07/09/2012    HOYLE, LUCY

## 2012-07-09 NOTE — Progress Notes (Signed)
Pt. Is wearing home CPAP.

## 2012-07-09 NOTE — Discharge Summary (Signed)
  Discharge summary job [phone]

## 2012-07-09 NOTE — Progress Notes (Signed)
Pt discharged home with family at 11:27. Discharge instructions provided by Harvel Ricks, PA. All questions answered. Pt escorted off unit in w/c with personal belonging by Lelon Mast, NT.

## 2012-07-09 NOTE — Discharge Instructions (Signed)
Heart Failure Heart failure (HF) is a condition in which the heart has trouble pumping blood. This means your heart does not pump blood efficiently for your body to work well. In some cases of HF, fluid may back up into your lungs or you may have swelling (edema) in your lower legs. HF is a long-term (chronic) condition. It is important for you to take good care of yourself and follow your caregiver's treatment plan. CAUSES   Health conditions:  High blood pressure (hypertension) causes the heart muscle to work harder than normal. When pressure in the blood vessels is high, the heart needs to pump (contract) with more force in order to circulate blood throughout the body. High blood pressure eventually causes the heart to become stiff and weak.  Coronary artery disease (CAD) is the buildup of cholesterol and fat (plaques) in the arteries of the heart. The blockage in the arteries deprives the heart muscle of oxygen and blood. This can cause chest pain and may lead to a heart attack. High blood pressure can also contribute to CAD.  Heart attack (myocardial infarction) occurs when 1 or more arteries in the heart become blocked. The loss of oxygen damages the muscle tissue of the heart. When this happens, part of the heart muscle dies. The injured tissue does not contract as well and weakens the heart's ability to pump blood.  Abnormal heart valves can cause HF when the heart valves do not open and close properly. This makes the heart muscle pump harder to keep the blood flowing.  Heart muscle disease (cardiomyopathy or myocarditis) is damage to the heart muscle from a variety of causes. These can include drug or alcohol abuse, infections, or unknown reasons. These can increase the risk of HF.  Lung disease makes the heart work harder because the lungs do not work properly. This can cause a strain on the heart leading it to fail.  Diabetes increases the risk of HF. High blood sugar contributes to high  fat (lipid) levels in the blood. Diabetes can also cause slow damage to tiny blood vessels that carry important nutrients to the heart muscle. When the heart does not get enough oxygen and food, it can cause the heart to become weak and stiff. This leads to a heart that does not contract efficiently.  Other diseases can contribute to HF. These include abnormal heart rhythms, thyroid problems, and low blood counts (anemia).  Unhealthy lifestyle habits:  Obesity.  Smoking.  Eating foods high in fat and cholesterol.  Eating or drinking beverages high in salt.  Drug or alcohol abuse.  Lack of exercise. SYMPTOMS  HF symptoms may vary and can be hard to detect. Symptoms may include:  Shortness of breath with activity, such as climbing stairs.  Persistent cough.  Swelling of the feet, ankles, legs, or abdomen.  Unexplained weight gain.  Difficulty breathing when lying flat.  Waking from sleep because of the need to sit up and get more air.  Rapid heartbeat.  Fatigue and loss of energy.  Feeling lightheaded or close to fainting. DIAGNOSIS  A diagnosis of HF is based on your history, symptoms, physical examination, and diagnostic tests. Diagnostic tests for HF may include:  EKG.  Chest X-ray.  Blood tests.  Exercise stress test.  Blood oxygen test (arterial blood gas).  Evaluation by a heart doctor (cardiologist).  Ultrasound evaluation of the heart (echocardiogram).  Heart artery test to look for blockages (angiogram).  Radioactive imaging to look at the heart (radionuclide  test). TREATMENT  Treatment is aimed at managing the symptoms of HF. Medicines, lifestyle changes, or surgical intervention may be necessary to treat HF.  Medicines to help treat HF may include:  Angiotensin-converting enzyme (ACE) inhibitors. These block the effects of a blood protein called angiotensin-converting enzyme. ACE inhibitors relax (dilate) the blood vessels and help lower blood  pressure. This decreases the workload of the heart, slows the progression of HF, and improves symptoms.  Angiotensin receptor blockers (ARBs). These medications work similar to ACE inhibitors. ARBs may be an alternative for people who cannot tolerate an ACE inhibitor.  Aldosterone antagonists. This medication helps get rid of extra fluid from your body. This lowers the volume of blood the heart has to pump.  Water pills (diuretics). Diuretics cause the kidneys to remove salt and water from the blood. The extra fluid is removed by urination. By removing extra fluid from the body, diuretics help lower the workload of the heart and help prevent fluid buildup in the lungs so breathing is easier.  Beta blockers. These prevent the heart from beating too fast and improve heart muscle strength. Beta blockers help maintain a normal heart rate, control blood pressure, and improve HF symptoms.  Digitalis. This increases the force of the heartbeat and may be helpful to people with HF or heart rhythm problems.  Healthy lifestyle changes include:  Stopping smoking.  Eating a healthy diet. Avoid foods high in fat. Avoid foods fried in oil or made with fat. A dietician can help with healthy food choices.  Limiting how much salt you eat.  Limiting alcohol intake to no more than 1 drink per day for women and 2 drinks per day for men. Drinking more than that is harmful to your heart. If your heart has already been damaged by alcohol or you have severe HF, drinking alcohol should be stopped completely.  Exercising as directed by your caregiver.  Surgical treatment for HF may include:  Procedures to open blocked arteries, repair damaged heart valves, or remove damaged heart muscle tissue.  A pacemaker to help heart muscle function and to control certain abnormal heart rhythms.  A defibrillator to possibly prevent sudden cardiac death. HOME CARE INSTRUCTIONS   Activity level. Your caregiver can help you  determine what type of exercise program may be helpful. It is important to maintain your strength. Pace your physical activity to avoid shortness of breath or chest pain. Rest for 1 hour before and after meals. A cardiac rehabilitation program may be helpful to some people with HF.  Diet. Eat a heart healthy diet. Food choices should be low in saturated fat and cholesterol. Talk to a dietician to learn about heart healthy foods.  Salt intake. When you have HF, you need to limit the amount of salt you eat. Eat less than 1500 milligrams (mg) of salt per day or as recommended by your caregiver.  Weight monitoring. Weigh yourself every day. You should weigh yourself in the morning after you urinate and before you eat breakfast. Wear the same amount of clothing each time you weigh yourself. Record your weight daily. Bring your recorded weights to your clinic visits. Tell your caregiver right away if you have gained 3 lb/1.4 kg in 1 day, or 5 lb/2.3 kg in a week or whatever amount you were told to report.  Blood pressure monitoring. This should be done as directed by your caregiver. A home blood pressure cuff can be purchased at a drugstore. Record your blood pressure numbers and  bring them to your clinic visits. Tell your caregiver if you become dizzy or lightheaded upon standing up.  Smoking. If you are currently a smoker, it is time to quit. Nicotine makes your heart work harder by causing your blood vessels to constrict. Do not use nicotine gum or patches before talking to your caregiver.  Follow up. Be sure to schedule a follow-up visit with your caregiver. Keep all your appointments. SEEK MEDICAL CARE IF:   Your weight increases by 3 lb/1.4 kg in 1 day or 5 lb/2.3 kg in a week.  You notice increasing shortness of breath that is unusual for you. This may happen during rest, sleep, or with activity.  You cough more than normal, especially with physical activity.  You notice more swelling in your  hands, feet, ankles, or belly (abdomen).  You are unable to sleep because it is hard to breathe.  You cough up bloody mucus (sputum).  You begin to feel "jumping" or "fluttering" sensations (palpitations) in your chest. SEEK IMMEDIATE MEDICAL CARE IF:   You have severe chest pain or pressure which may include symptoms such as:  Pain or pressure in the arms, neck, jaw, or back.  Feeling sweaty.  Feeling sick to your stomach (nauseous).  Feeling short of breath while at rest.  Having a fast or irregular heartbeat.  You experience stroke symptoms. These symptoms include:  Facial weakness or numbness.  Weakness or numbness in an arm, leg, or on one side of your body.  Blurred vision.  Difficulty talking or thinking.  Dizziness or fainting.  Severe headache. MAKE SURE YOU:   Understand these instructions.  Will watch your condition.  Will get help right away if you are not doing well or get worse. Document Released: 04/14/2005 Document Revised: 10/14/2011 Document Reviewed: 07/27/2009 [facility] Patient Information 2013 [place], [place]. Inpatient Rehab Discharge Instructions  [facility] Discharge date and time: No discharge date for patient encounter.   Activities/Precautions/ Functional Status: Activity: activity as tolerated Diet: diabetic diet Wound Care: none needed Functional status:  ___ No restrictions     ___ Walk up steps independently __x_ 24/7 supervision/assistance   ___ Walk up steps with assistance ___ Intermittent supervision/assistance  ___ Bathe/dress independently ___ Walk with walker     ___ Bathe/dress with assistance ___ Walk Independently    ___ Shower independently _x__ Walk with assistance    ___ Shower with assistance ___ No alcohol     ___ Return to work/school ________   COMMUNITY REFERRALS UPON DISCHARGE:    Home Health:   PT     OT     RN                       Agency: [facility] Phone: [phone]   Medical  Equipment/Items Ordered:  Rolling walker and tub bench                                                    Agency/Supplier: [facility]   Special Instructions:    My questions have been answered and I understand these instructions. I will adhere to these goals and the provided educational materials after my discharge from the hospital.  Patient/Caregiver Signature _______________________________ Date __________  Clinician Signature _______________________________________ Date __________  Please bring this form and your medication list  with you to all your follow-up doctor's appointments.

## 2012-07-09 NOTE — Progress Notes (Signed)
Patient ID: [name], male   DOB: 06-20-1931, 79 y.o.   MRN: [id number] Subjective/Complaints: No new issues. Excited to go home today!  review of systems has been performed and if not noted above is otherwise negative.   Objective: Vital Signs: Blood pressure 131/71, pulse 61, temperature 98.1 F (36.7 C), temperature source Oral, resp. rate 18, weight 170.126 kg (375 lb 1 oz), SpO2 95.00%. No results found. No results found for this basename: WBC, HGB, HCT, PLT,  in the last 72 hours No results found for this basename: NA, K, CL, CO, GLUCOSE, BUN, CREATININE, CALCIUM,  in the last 72 hours CBG (last 3)   Recent Labs  07/08/12 1631 07/08/12 2129 07/09/12 0705  GLUCAP 107* 165* 84    Wt Readings from Last 3 Encounters:  07/08/12 170.126 kg (375 lb 1 oz)  06/29/12 168.693 kg (371 lb 14.4 oz)  06/23/12 170.099 kg (375 lb)    Physical Exam:  Constitutional: He is oriented to person, place, and time. Wearing CPAP/nasal 79 year old morbidly obese white male.  Eyes:  Left eye enucleated Neck: Neck supple. No thyromegaly present.  Cardiovascular:  Cardiac rate controlled. No murmurs rubs or gallops, regular  Pulmonary/Chest:  Decreased breath sounds at the bases without wheeze. No distress  Abdominal: Soft. Bowel sounds are normal. He exhibits no distension. There is no tenderness.  Musculoskeletal:  3+ pitting edema left leg. 2+ right leg. Chronic stasis changes in either leg. Left knee limited with A and P flexion due to pain. Right knee appears less affected.  Neurological: He is alert and oriented to person, place, and time.  Follows simple commands. Moves all 4's. No focal weakness although inhibition with pain and lmitations due to severe edema in each leg. UE grossly 4/5. Very HOH  Skin: Skin is warm and dry. Flaking/indurated legs Psychiatric: He has a normal mood and affect. His behavior is normal. Judgment and thought content normal  Assessment/Plan: 1.  Functional deficits secondary to deconditioning related to diastolic CHF and multiple medical issues which require 3+ hours per day of interdisciplinary therapy in a comprehensive inpatient rehab setting. Physiatrist is providing close team supervision and 24 hour management of active medical problems listed below. Physiatrist and rehab team continue to assess barriers to discharge/monitor patient progress toward functional and medical goals.     FIM: FIM - Bathing Bathing Steps Patient Completed: Chest;Right Arm;Left Arm;Abdomen;Front perineal area;Buttocks;Right upper leg;Left upper leg;Right lower leg (including foot);Left lower leg (including foot) Bathing: 6: Assistive device (Comment)  FIM - Upper Body Dressing/Undressing Upper body dressing/undressing steps patient completed: Thread/unthread right sleeve of pullover shirt/dresss;Thread/unthread left sleeve of pullover shirt/dress;Put head through opening of pull over shirt/dress;Pull shirt over trunk;Thread/unthread right sleeve of front closure shirt/dress Upper body dressing/undressing: 7: Complete Independence: No helper FIM - Lower Body Dressing/Undressing Lower body dressing/undressing steps patient completed: Thread/unthread left underwear leg;Pull underwear up/down;Thread/unthread right underwear leg;Thread/unthread right pants leg;Thread/unthread left pants leg;Pull pants up/down Lower body dressing/undressing: 4: Min-Patient completed 75 plus % of tasks  FIM - Toileting Toileting steps completed by patient: Adjust clothing prior to toileting;Performs perineal hygiene;Adjust clothing after toileting Toileting Assistive Devices: Grab bar or rail for support Toileting: 6: Assistive device: No helper  FIM - Diplomatic Services operational officer Devices: Elevated toilet seat;Walker;Grab bars Toilet Transfers: 6-Assistive device: No helper  FIM - Banker Devices: Manufacturing systems engineer  Transfer: 6: Assistive device: no helper  FIM - Locomotion: Wheelchair Locomotion: Wheelchair: 5: Travels 50 - 149  ft, turns around, maneuvers to table, bed or toilet, negotiates 3% grade: modified independent FIM - Locomotion: Ambulation Locomotion: Ambulation Assistive Devices: Designer, industrial/product Ambulation/Gait Assistance: 6: Modified independent (Device/Increase time) Locomotion: Ambulation: 5: Household Independent - travels 50 - 149 ft independent or modified independent  Comprehension Comprehension Mode: Auditory Comprehension: 6-Follows complex conversation/direction: With extra time/assistive device  Expression Expression Mode: Verbal Expression: 7-Expresses complex ideas: With no assist  Social Interaction Social Interaction: 7-Interacts appropriately with others - No medications needed.  Problem Solving Problem Solving: 6-Solves complex problems: With extra time  Memory Memory: 7-Complete Independence: No helper  Medical Problem List and Plan:  1. Physical deconditioning related to diastolic congestive heart failure and multiple medical issues.  2. DVT Prophylaxis/Anticoagulation: Subcutaneous heparin. Monitor platelet counts any signs of bleeding.  3. Pain Management: Neurontin 300 mg each bedtime, Lidoderm patch as needed hydrocodone 1 tablet every 6 hours as needed pain.  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   -discussed surgical intervention, which, if any, would be down the line a bit. 4. Neuropsych: This patient is capable of making decisions on his/her own behalf.  5. Diastolic congestive heart failure. Significant diuresis on acute.      -weight 170kg  -continue potassium replacement  -bmet pending  -demadex per cards with close outpt followup--he sees [place] as an outpt 6. 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 Diabetes mellitus with peripheral neuropathy. Hemoglobin A1c 7.6.   Check blood  sugars a.c. and at bedtime. Titrated lantus to 45u bid with some improvement yesterday--continued good control 8. Sleep apnea. Continue Nasal CPAP 9. CAD/PSVT. Continue aspirin therapy. No chest pain or shortness of breath. Followup cardiology services 10. Urine retention. ucx negative LOS (Days) 10 A FACE TO FACE EVALUATION WAS PERFORMED  SWARTZ,ZACHARY T 07/09/2012 7:55 AM

## 2012-07-09 NOTE — Discharge Summary (Signed)
[name], [name] NO.:  [id number]  MEDICAL RECORD NO.:  [id number]  LOCATION:  4006                         FACILITY:  [facility]  PHYSICIAN:  Ranelle Oyster, M.D.DATE OF BIRTH:  1931/07/18  DATE OF ADMISSION:  06/29/2012 DATE OF DISCHARGE:  07/09/2012                              DISCHARGE SUMMARY   DISCHARGE DIAGNOSES: 1. Physical deconditioning related to diastolic congestive heart     failure. 2. Subcutaneous heparin for deep vein thrombosis prophylaxis. 3. Pain management. 4. History of trauma with left eye blindness. 5. Diabetes mellitus. 6. Peripheral neuropathy. 7. Sleep apnea. 8. Coronary artery disease with paroxysmal supraventricular     tachycardia. 9. Urinary retention.  HISTORY OF PRESENT ILLNESS:  This is a 79 year old right-handed male, history of systolic congestive heart failure, admitted June 23, 2012, with decrease in mobility over the past several months, using a wheelchair as well as worsening leg edema and increasing shortness of breath on exertion.  Chest x-ray showed mild interstitial edema.  Follow up Cardiology Services noted evidence of clinically marked fluid overload and placed on intravenous diuresis.  Echocardiogram with ejection fraction 50% and grade 2 diastolic dysfunction.  Venous Doppler studies lower extremities negative.  Maintained on subcutaneous heparin for DVT prophylaxis.  Noted, the patient diuresed 39 pounds since admission.  Noted venous stasis changes lower extremities with wraps in place.  Physical and occupational therapy ongoing.  The patient was admitted for comprehensive rehab program.  PAST MEDICAL HISTORY:  See discharge diagnoses.  SOCIAL HISTORY:  Lives with spouse, 1 level home, ramp to entrance.  FUNCTIONAL HISTORY:  Prior to admission; no driving, retired.  FUNCTIONAL STATUS:  Upon admission to rehab services, minimal assist, ambulated 18 feet with a rolling walker.  PHYSICAL  EXAMINATION:  VITAL SIGNS:  Blood pressure 123/49, pulse 67, temperature 98.4, respirations 18. GENERAL:  This was an alert male, oriented x3, left eye blindness. LUNGS:  Decreased breath sounds.  Clear to auscultation. CARDIAC:  Rate controlled. ABDOMEN:  Soft, nontender.  Good bowel sounds. EXTREMITIES:  +3 pitting edema left leg, 2+ right leg.  REHABILITATION HOSPITAL COURSE:  The patient was admitted to inpatient rehab services with therapies initiated on a 3-hour daily basis consisting of physical therapy, occupational therapy, and rehabilitation nursing.  The following issues were addressed during the patient's rehabilitation stay.  Pertaining to [name]'s physical deconditioning related to diastolic congestive heart failure, he had been diuresed approximately more than 40 pounds.  Weight has remained stable.  He was now on Demadex 60 mg daily per Cardiology Services.  He had no increased shortness of breath.  He remained on subcutaneous heparin for DVT prophylaxis.  Pain management with the use of Neurontin at bedtime as well as a Lidoderm patch.  His blood sugars remained within acceptable range with latest hemoglobin 7.6.  He remained on Lantus insulin.  He was using CPAP for sleep apnea.  He did have some urinary retention improved with increase mobility as he remained on Flomax.  The patient received weekly collaborative interdisciplinary team conferences to discuss estimated length of stay, family teaching, and any issues prior to discharge.  The following issues were addressed during the patient's  rehab stay.  Pertaining to [name], he was continent of bowel and bladder, required minimal assist to supervision for activities of daily living, supervision minimal assist for overall functional mobility.  His strength and endurance greatly improved.  Home therapies would be ongoing.  Full family teaching completed with his wife.  DISCHARGE MEDICATIONS: 1. Aspirin 162 mg  p.o. daily. 2. Vitamin D 5000 units daily. 3. Flonase 2 sprays daily. 4. Neurontin 300 mg at bedtime. 5. Norco 1 tablet every 6 hours as needed for pain. 6. Lantus insulin 45 units b.i.d. 7. Labetalol 200 mg b.i.d. 8. Xalatan ophthalmic solution 1 drop right eye at bedtime. 9. Lidoderm patch change every 12 hours. 10.Claritin 10 mg daily. 11.Singulair 10 mg at bedtime. 12.Multivitamin daily. 13.Potassium chloride 40 mEq twice daily. 14.Zocor 40 mg at bedtime. 15.Flomax 0.4 mg at bedtime. 16.Demadex 60 mg daily.  DIET:  Diabetic diet.  SPECIAL INSTRUCTIONS:  The patient would follow up Dr. Faith Rogue at the outpatient rehab service office as advised, Dr. Arvilla Meres July 15, 2012, Dr. Dwana Melena medical management.     Mariam Dollar, P.A.   ______________________________ Ranelle Oyster, M.D.    DA/MEDQ  D:  07/09/2012  T:  07/09/2012  Job:  [id number]  cc:   Ranelle Oyster, M.D. Bevelyn Buckles. Bensimhon, MD Catalina Pizza, M.D.

## 2012-07-12 DIAGNOSIS — I5032 Chronic diastolic (congestive) heart failure: Secondary | ICD-10-CM

## 2012-07-12 DIAGNOSIS — I2510 Atherosclerotic heart disease of native coronary artery without angina pectoris: Secondary | ICD-10-CM | POA: Diagnosis not present

## 2012-07-12 DIAGNOSIS — I509 Heart failure, unspecified: Secondary | ICD-10-CM | POA: Diagnosis not present

## 2012-07-15 ENCOUNTER — Ambulatory Visit (HOSPITAL_COMMUNITY)
Admit: 2012-07-15 | Discharge: 2012-07-15 | Disposition: A | Discharge status: Home / Self Care | Payer: Medicare Other | Attending: Internal Medicine | Admitting: Internal Medicine

## 2012-07-15 VITALS — BP 146/90 | HR 64 | Wt 378.8 lb

## 2012-07-15 DIAGNOSIS — I5032 Chronic diastolic (congestive) heart failure: Secondary | ICD-10-CM

## 2012-07-15 NOTE — Assessment & Plan Note (Addendum)
Reviewed discharge summary. Volume status stable. Continue current diuretic regimen. Reinforced low salt food choices, limiting fluid intake, daily weights, and medication compliance. Follow up in 3 weeks to reassess volume. He request to be transferred back to Dr Diona Browner after next visit.

## 2012-07-15 NOTE — Progress Notes (Signed)
Patient ID: [name], male   DOB: 1932/01/30, 79 y.o.   MRN: [id number]  Weight Range  370 pounds  Baseline proBNP   Cardiologist: Dr Diona Browner. PCP: Dr Margo Aye  HPI: [name] is a 79 year with a PMH of morbid obesity, chronic diastolic heart failure EF 50% (previous EF 60-65%), HTN, SVT/PSVT/PAT, CAD, DMII, OSA-CPAP, arthritis, fibromyalgia, and hyperlipidemia.   He was diuresed with IV lasix and transitioned to Demadex 60 mg daily. He required short course on inpatient rehab. Discharge weight 370 pounds.   He returns for post hospital follow up with his wife. Complains of mild dyspnea.  Denies SOB/PND/Orthopnea. Uses CPAP at night. Weight at home 367-371 pounds.  Complaint with medications. He is able to ambulate with a walker. [facility] following for  [facility] and [facility] starting this week.  Following low salt diet.     ROS: All systems negative except as listed in HPI, PMH and Problem List.  Past Medical History  Diagnosis Date  . PSVT (paroxysmal supraventricular tachycardia)     AVNRT  . Coronary atherosclerosis of native coronary artery     Nonobstructive, LVEF 65%  . Hyperlipidemia   . Morbid obesity   . Arthritis   . Type 2 diabetes mellitus   . Vertigo   . Venous insufficiency   . Sleep apnea, obstructive   . Chronic diastolic heart failure   . Shortness of breath   . CHF (congestive heart failure)     Current Outpatient Prescriptions  Medication Sig Dispense Refill  . aspirin 81 MG chewable tablet Chew 2 tablets (162 mg total) by mouth daily.      . cetirizine (ZYRTEC) 10 MG tablet Take 10 mg by mouth every evening.       . Cholecalciferol (D-3-5) 5000 UNITS capsule Take 5,000 Units by mouth daily.        . fluticasone (FLONASE) 50 MCG/ACT nasal spray Place 2 sprays into the nose daily.       Marland Kitchen gabapentin (NEURONTIN) 300 MG capsule Take 1 capsule (300 mg total) by mouth at bedtime.  30 capsule  1  . HYDROcodone-acetaminophen (NORCO/VICODIN) 5-325 MG per tablet Take 1 tablet  by mouth every 6 (six) hours as needed for pain.  30 tablet  0  . insulin glargine (LANTUS) 100 UNIT/ML injection Inject 45 Units into the skin 2 (two) times daily.  10 mL  1  . labetalol (NORMODYNE) 200 MG tablet Take 1 tablet (200 mg total) by mouth 2 (two) times daily.  60 tablet  1  . latanoprost (XALATAN) 0.005 % ophthalmic solution Place 1 drop into the right eye at bedtime.       . lidocaine (LIDODERM) 5 % Place 1 patch onto the skin daily as needed. Remove & Discard patch within 12 hours or as directed by MD.  30 patch  0  . montelukast (SINGULAIR) 10 MG tablet Take 10 mg by mouth at bedtime.        . Multiple Vitamins-Minerals (CENTRUM SILVER PO) Take 1 tablet by mouth daily.        . simvastatin (ZOCOR) 40 MG tablet Take 40 mg by mouth at bedtime.        . Tamsulosin HCl (FLOMAX) 0.4 MG CAPS Take 0.4 mg by mouth at bedtime.       . torsemide (DEMADEX) 20 MG tablet Take 3 tablets (60 mg total) by mouth daily.  30 tablet  1   No current facility-administered medications for this encounter.  PHYSICAL EXAM: Filed Vitals:   07/15/12 1108  BP: 146/90  Pulse: 64  Weight: 378 lb 12.8 oz (171.823 kg)  SpO2: 95%   Physical Exam:  General: Morbidly obese. Sitting in chair No resp difficulty with his wife HEENT: normal except for blind in L eye  Neck: supple. JVP hard to see but does not appear elevated. Carotids 2+ bilat; no bruits. No lymphadenopathy or thryomegaly appreciated.  Cor: PMI nonpalpable. Regular rate & rhythm. No rubs, gallops or murmurs.  Lungs: clear  Abdomen: morbidly obese, soft, nontender, + distended. . No bruits or masses. Good bowel sounds.  Extremities: no cyanosis, clubbing, rash, RLE tr+ LLE 2+ edema severe chronic venous stasis changes.  Neuro: alert & orientedx3, cranial nerves grossly intact. moves all 4 extremities w/o difficulty. Affect pleasant      ASSESSMENT & PLAN:

## 2012-07-15 NOTE — Patient Instructions (Addendum)
Follow up in 3 weeks  Do the following things EVERYDAY: 1) Weigh yourself in the morning before breakfast. Write it down and keep it in a log. 2) Take your medicines as prescribed 3) Eat low salt foods-Limit salt (sodium) to 2000 mg per day.  4) Stay as active as you can everyday 5) Limit all fluids for the day to less than 2 liters 

## 2012-07-16 DIAGNOSIS — I509 Heart failure, unspecified: Secondary | ICD-10-CM | POA: Diagnosis not present

## 2012-07-16 DIAGNOSIS — I2510 Atherosclerotic heart disease of native coronary artery without angina pectoris: Secondary | ICD-10-CM | POA: Diagnosis not present

## 2012-07-16 DIAGNOSIS — E119 Type 2 diabetes mellitus without complications: Secondary | ICD-10-CM | POA: Diagnosis not present

## 2012-07-16 DIAGNOSIS — M159 Polyosteoarthritis, unspecified: Secondary | ICD-10-CM | POA: Diagnosis not present

## 2012-07-16 DIAGNOSIS — I10 Essential (primary) hypertension: Secondary | ICD-10-CM | POA: Diagnosis not present

## 2012-07-16 DIAGNOSIS — I5023 Acute on chronic systolic (congestive) heart failure: Secondary | ICD-10-CM | POA: Diagnosis not present

## 2012-07-19 DIAGNOSIS — I5023 Acute on chronic systolic (congestive) heart failure: Secondary | ICD-10-CM | POA: Diagnosis not present

## 2012-07-19 DIAGNOSIS — I509 Heart failure, unspecified: Secondary | ICD-10-CM | POA: Diagnosis not present

## 2012-07-19 DIAGNOSIS — I10 Essential (primary) hypertension: Secondary | ICD-10-CM | POA: Diagnosis not present

## 2012-07-19 DIAGNOSIS — M159 Polyosteoarthritis, unspecified: Secondary | ICD-10-CM | POA: Diagnosis not present

## 2012-07-19 DIAGNOSIS — I2510 Atherosclerotic heart disease of native coronary artery without angina pectoris: Secondary | ICD-10-CM | POA: Diagnosis not present

## 2012-07-19 DIAGNOSIS — E119 Type 2 diabetes mellitus without complications: Secondary | ICD-10-CM | POA: Diagnosis not present

## 2012-07-21 DIAGNOSIS — E119 Type 2 diabetes mellitus without complications: Secondary | ICD-10-CM | POA: Diagnosis not present

## 2012-07-21 DIAGNOSIS — I5023 Acute on chronic systolic (congestive) heart failure: Secondary | ICD-10-CM | POA: Diagnosis not present

## 2012-07-21 DIAGNOSIS — M159 Polyosteoarthritis, unspecified: Secondary | ICD-10-CM | POA: Diagnosis not present

## 2012-07-21 DIAGNOSIS — I2510 Atherosclerotic heart disease of native coronary artery without angina pectoris: Secondary | ICD-10-CM | POA: Diagnosis not present

## 2012-07-21 DIAGNOSIS — I10 Essential (primary) hypertension: Secondary | ICD-10-CM | POA: Diagnosis not present

## 2012-07-21 DIAGNOSIS — I509 Heart failure, unspecified: Secondary | ICD-10-CM | POA: Diagnosis not present

## 2012-07-22 DIAGNOSIS — I509 Heart failure, unspecified: Secondary | ICD-10-CM | POA: Diagnosis not present

## 2012-07-22 DIAGNOSIS — I5023 Acute on chronic systolic (congestive) heart failure: Secondary | ICD-10-CM | POA: Diagnosis not present

## 2012-07-22 DIAGNOSIS — I2510 Atherosclerotic heart disease of native coronary artery without angina pectoris: Secondary | ICD-10-CM | POA: Diagnosis not present

## 2012-07-22 DIAGNOSIS — I10 Essential (primary) hypertension: Secondary | ICD-10-CM | POA: Diagnosis not present

## 2012-07-22 DIAGNOSIS — M159 Polyosteoarthritis, unspecified: Secondary | ICD-10-CM | POA: Diagnosis not present

## 2012-07-22 DIAGNOSIS — E119 Type 2 diabetes mellitus without complications: Secondary | ICD-10-CM | POA: Diagnosis not present

## 2012-07-23 DIAGNOSIS — R609 Edema, unspecified: Secondary | ICD-10-CM | POA: Diagnosis not present

## 2012-07-23 DIAGNOSIS — M159 Polyosteoarthritis, unspecified: Secondary | ICD-10-CM | POA: Diagnosis not present

## 2012-07-23 DIAGNOSIS — I5023 Acute on chronic systolic (congestive) heart failure: Secondary | ICD-10-CM | POA: Diagnosis not present

## 2012-07-23 DIAGNOSIS — I2510 Atherosclerotic heart disease of native coronary artery without angina pectoris: Secondary | ICD-10-CM | POA: Diagnosis not present

## 2012-07-23 DIAGNOSIS — I509 Heart failure, unspecified: Secondary | ICD-10-CM | POA: Diagnosis not present

## 2012-07-23 DIAGNOSIS — I10 Essential (primary) hypertension: Secondary | ICD-10-CM | POA: Diagnosis not present

## 2012-07-23 DIAGNOSIS — E119 Type 2 diabetes mellitus without complications: Secondary | ICD-10-CM | POA: Diagnosis not present

## 2012-07-26 DIAGNOSIS — M159 Polyosteoarthritis, unspecified: Secondary | ICD-10-CM | POA: Diagnosis not present

## 2012-07-26 DIAGNOSIS — E119 Type 2 diabetes mellitus without complications: Secondary | ICD-10-CM | POA: Diagnosis not present

## 2012-07-26 DIAGNOSIS — I2510 Atherosclerotic heart disease of native coronary artery without angina pectoris: Secondary | ICD-10-CM | POA: Diagnosis not present

## 2012-07-26 DIAGNOSIS — I10 Essential (primary) hypertension: Secondary | ICD-10-CM | POA: Diagnosis not present

## 2012-07-26 DIAGNOSIS — I5023 Acute on chronic systolic (congestive) heart failure: Secondary | ICD-10-CM | POA: Diagnosis not present

## 2012-07-26 DIAGNOSIS — E1149 Type 2 diabetes mellitus with other diabetic neurological complication: Secondary | ICD-10-CM | POA: Diagnosis not present

## 2012-07-26 DIAGNOSIS — I509 Heart failure, unspecified: Secondary | ICD-10-CM | POA: Diagnosis not present

## 2012-07-28 DIAGNOSIS — I2510 Atherosclerotic heart disease of native coronary artery without angina pectoris: Secondary | ICD-10-CM | POA: Diagnosis not present

## 2012-07-28 DIAGNOSIS — M159 Polyosteoarthritis, unspecified: Secondary | ICD-10-CM | POA: Diagnosis not present

## 2012-07-28 DIAGNOSIS — E119 Type 2 diabetes mellitus without complications: Secondary | ICD-10-CM | POA: Diagnosis not present

## 2012-07-28 DIAGNOSIS — I5023 Acute on chronic systolic (congestive) heart failure: Secondary | ICD-10-CM | POA: Diagnosis not present

## 2012-07-28 DIAGNOSIS — I10 Essential (primary) hypertension: Secondary | ICD-10-CM | POA: Diagnosis not present

## 2012-07-28 DIAGNOSIS — I509 Heart failure, unspecified: Secondary | ICD-10-CM | POA: Diagnosis not present

## 2012-07-30 DIAGNOSIS — I2510 Atherosclerotic heart disease of native coronary artery without angina pectoris: Secondary | ICD-10-CM | POA: Diagnosis not present

## 2012-07-30 DIAGNOSIS — E119 Type 2 diabetes mellitus without complications: Secondary | ICD-10-CM | POA: Diagnosis not present

## 2012-07-30 DIAGNOSIS — I10 Essential (primary) hypertension: Secondary | ICD-10-CM | POA: Diagnosis not present

## 2012-07-30 DIAGNOSIS — I5023 Acute on chronic systolic (congestive) heart failure: Secondary | ICD-10-CM | POA: Diagnosis not present

## 2012-07-30 DIAGNOSIS — M159 Polyosteoarthritis, unspecified: Secondary | ICD-10-CM | POA: Diagnosis not present

## 2012-07-30 DIAGNOSIS — I509 Heart failure, unspecified: Secondary | ICD-10-CM | POA: Diagnosis not present

## 2012-08-02 DIAGNOSIS — I5023 Acute on chronic systolic (congestive) heart failure: Secondary | ICD-10-CM | POA: Diagnosis not present

## 2012-08-02 DIAGNOSIS — E119 Type 2 diabetes mellitus without complications: Secondary | ICD-10-CM | POA: Diagnosis not present

## 2012-08-02 DIAGNOSIS — I2510 Atherosclerotic heart disease of native coronary artery without angina pectoris: Secondary | ICD-10-CM | POA: Diagnosis not present

## 2012-08-02 DIAGNOSIS — I509 Heart failure, unspecified: Secondary | ICD-10-CM | POA: Diagnosis not present

## 2012-08-02 DIAGNOSIS — M159 Polyosteoarthritis, unspecified: Secondary | ICD-10-CM | POA: Diagnosis not present

## 2012-08-02 DIAGNOSIS — I10 Essential (primary) hypertension: Secondary | ICD-10-CM | POA: Diagnosis not present

## 2012-08-04 DIAGNOSIS — M159 Polyosteoarthritis, unspecified: Secondary | ICD-10-CM | POA: Diagnosis not present

## 2012-08-04 DIAGNOSIS — E119 Type 2 diabetes mellitus without complications: Secondary | ICD-10-CM | POA: Diagnosis not present

## 2012-08-04 DIAGNOSIS — I2510 Atherosclerotic heart disease of native coronary artery without angina pectoris: Secondary | ICD-10-CM | POA: Diagnosis not present

## 2012-08-04 DIAGNOSIS — I5023 Acute on chronic systolic (congestive) heart failure: Secondary | ICD-10-CM | POA: Diagnosis not present

## 2012-08-04 DIAGNOSIS — I509 Heart failure, unspecified: Secondary | ICD-10-CM | POA: Diagnosis not present

## 2012-08-04 DIAGNOSIS — I10 Essential (primary) hypertension: Secondary | ICD-10-CM | POA: Diagnosis not present

## 2012-08-05 ENCOUNTER — Encounter (HOSPITAL_COMMUNITY): Payer: Self-pay

## 2012-08-05 ENCOUNTER — Ambulatory Visit (HOSPITAL_COMMUNITY)
Admission: RE | Admit: 2012-08-05 | Discharge: 2012-08-05 | Disposition: A | Discharge status: Home / Self Care | Payer: Medicare Other | Source: Ambulatory Visit | Attending: Internal Medicine | Admitting: Internal Medicine

## 2012-08-05 VITALS — BP 108/52 | HR 63 | Wt 353.1 lb

## 2012-08-05 DIAGNOSIS — M129 Arthropathy, unspecified: Secondary | ICD-10-CM | POA: Diagnosis not present

## 2012-08-05 DIAGNOSIS — I10 Essential (primary) hypertension: Secondary | ICD-10-CM | POA: Insufficient documentation

## 2012-08-05 DIAGNOSIS — E785 Hyperlipidemia, unspecified: Secondary | ICD-10-CM | POA: Diagnosis not present

## 2012-08-05 DIAGNOSIS — E119 Type 2 diabetes mellitus without complications: Secondary | ICD-10-CM | POA: Insufficient documentation

## 2012-08-05 DIAGNOSIS — Z7982 Long term (current) use of aspirin: Secondary | ICD-10-CM | POA: Diagnosis not present

## 2012-08-05 DIAGNOSIS — I5032 Chronic diastolic (congestive) heart failure: Secondary | ICD-10-CM | POA: Insufficient documentation

## 2012-08-05 DIAGNOSIS — G4733 Obstructive sleep apnea (adult) (pediatric): Secondary | ICD-10-CM | POA: Diagnosis not present

## 2012-08-05 DIAGNOSIS — R5381 Other malaise: Secondary | ICD-10-CM

## 2012-08-05 DIAGNOSIS — I2510 Atherosclerotic heart disease of native coronary artery without angina pectoris: Secondary | ICD-10-CM | POA: Insufficient documentation

## 2012-08-05 DIAGNOSIS — M79609 Pain in unspecified limb: Secondary | ICD-10-CM | POA: Insufficient documentation

## 2012-08-05 DIAGNOSIS — Z794 Long term (current) use of insulin: Secondary | ICD-10-CM | POA: Diagnosis not present

## 2012-08-05 DIAGNOSIS — Z79899 Other long term (current) drug therapy: Secondary | ICD-10-CM | POA: Insufficient documentation

## 2012-08-05 NOTE — Assessment & Plan Note (Signed)
Encouraged him to keep up the good work.  As above, will discuss cardiac rehab once PT has discharged him.

## 2012-08-05 NOTE — Assessment & Plan Note (Signed)
Continue with home PT.  Will discuss referral to cardiac rehab after discharged from PT.

## 2012-08-05 NOTE — Assessment & Plan Note (Signed)
Volume status well controlled.  Will continue torsemide 60 mg daily.  Have discussed sliding scale torsemide and encouraged him to continue daily weights and blood pressure checks.

## 2012-08-05 NOTE — Progress Notes (Signed)
Weight Range  370 pounds  Baseline proBNP   Cardiologist: Dr Diona Browner. PCP: Dr Margo Aye   HPI: [name] is a 55 year with a PMH of morbid obesity, chronic diastolic heart failure EF 50% (previous EF 60-65%), HTN, SVT/PSVT/PAT, CAD, DMII, OSA-CPAP, arthritis, fibromyalgia, and hyperlipidemia.   Admitted 2/26 with HF decompensation.  He was diuresed with IV lasix and transitioned to Demadex 60 mg daily. He required short course on inpatient rehab. Discharge weight 370 pounds.   He returns for follow up today.  He feels great.  He is doing very well.  He is working with PT several times a week.  Still gets dyspneic with ambulation but it has improved.  Weight at home is down to 347 pounds.  He is following his diet strictly.  No extra fluid pills.  Has to use walker.  He denies orthopnea/PND.  He denies chest pain.  SBP 119-140 pounds.     ROS: All systems negative except as listed in HPI, PMH and Problem List.  Past Medical History  Diagnosis Date  . PSVT (paroxysmal supraventricular tachycardia)     AVNRT  . Coronary atherosclerosis of native coronary artery     Nonobstructive, LVEF 65%  . Hyperlipidemia   . Morbid obesity   . Arthritis   . Type 2 diabetes mellitus   . Vertigo   . Venous insufficiency   . Sleep apnea, obstructive   . Chronic diastolic heart failure   . Shortness of breath   . CHF (congestive heart failure)     Current Outpatient Prescriptions  Medication Sig Dispense Refill  . aspirin 81 MG chewable tablet Chew 2 tablets (162 mg total) by mouth daily.      . cetirizine (ZYRTEC) 10 MG tablet Take 10 mg by mouth every evening.       . Cholecalciferol (D-3-5) 5000 UNITS capsule Take 5,000 Units by mouth daily.        . fluticasone (FLONASE) 50 MCG/ACT nasal spray Place 2 sprays into the nose daily.       Marland Kitchen gabapentin (NEURONTIN) 300 MG capsule Take 1 capsule (300 mg total) by mouth at bedtime.  30 capsule  1  . HYDROcodone-acetaminophen (NORCO/VICODIN) 5-325 MG per  tablet Take 1 tablet by mouth every 6 (six) hours as needed for pain.  30 tablet  0  . insulin glargine (LANTUS) 100 UNIT/ML injection Inject 45 Units into the skin 2 (two) times daily.  10 mL  1  . labetalol (NORMODYNE) 200 MG tablet Take 1 tablet (200 mg total) by mouth 2 (two) times daily.  60 tablet  1  . latanoprost (XALATAN) 0.005 % ophthalmic solution Place 1 drop into the right eye at bedtime.       . lidocaine (LIDODERM) 5 % Place 1 patch onto the skin daily as needed. Remove & Discard patch within 12 hours or as directed by MD.  30 patch  0  . montelukast (SINGULAIR) 10 MG tablet Take 10 mg by mouth at bedtime.        . Multiple Vitamins-Minerals (CENTRUM SILVER PO) Take 1 tablet by mouth daily.        . simvastatin (ZOCOR) 40 MG tablet Take 40 mg by mouth at bedtime.        . Tamsulosin HCl (FLOMAX) 0.4 MG CAPS Take 0.4 mg by mouth at bedtime.       . torsemide (DEMADEX) 20 MG tablet Take 3 tablets (60 mg total) by mouth daily.  30 tablet  1   No current facility-administered medications for this encounter.     PHYSICAL EXAM: Filed Vitals:   08/05/12 1418  BP: 108/52  Pulse: 63  Weight: 353 lb 1.6 oz (160.165 kg)  SpO2: 90%   Physical Exam:  General: Morbidly obese. Sitting in wheelchair.  No resp difficulty HEENT: normal except for blind in L eye  Neck: supple. JVP hard to see but does not appear elevated. Carotids 2+ bilat; no bruits. No lymphadenopathy or thryomegaly appreciated.  Cor: PMI nonpalpable. Regular rate & rhythm. No rubs, gallops or murmurs.  Lungs: clear  Abdomen: morbidly obese, soft, nontender, nondistended. . No bruits or masses. Good bowel sounds.  Extremities: no cyanosis, clubbing, rash, RLE tr+ LLE 2+ edema severe chronic venous stasis changes.   Bilateral TED hose  Neuro: alert & orientedx3, cranial nerves grossly intact. moves all 4 extremities w/o difficulty. Affect pleasant      ASSESSMENT & PLAN:

## 2012-08-06 DIAGNOSIS — I5023 Acute on chronic systolic (congestive) heart failure: Secondary | ICD-10-CM | POA: Diagnosis not present

## 2012-08-06 DIAGNOSIS — I2510 Atherosclerotic heart disease of native coronary artery without angina pectoris: Secondary | ICD-10-CM | POA: Diagnosis not present

## 2012-08-06 DIAGNOSIS — E119 Type 2 diabetes mellitus without complications: Secondary | ICD-10-CM | POA: Diagnosis not present

## 2012-08-06 DIAGNOSIS — I509 Heart failure, unspecified: Secondary | ICD-10-CM | POA: Diagnosis not present

## 2012-08-06 DIAGNOSIS — M159 Polyosteoarthritis, unspecified: Secondary | ICD-10-CM | POA: Diagnosis not present

## 2012-08-06 DIAGNOSIS — I10 Essential (primary) hypertension: Secondary | ICD-10-CM | POA: Diagnosis not present

## 2012-08-10 DIAGNOSIS — I509 Heart failure, unspecified: Secondary | ICD-10-CM | POA: Diagnosis not present

## 2012-08-10 DIAGNOSIS — I2510 Atherosclerotic heart disease of native coronary artery without angina pectoris: Secondary | ICD-10-CM | POA: Diagnosis not present

## 2012-08-10 DIAGNOSIS — I10 Essential (primary) hypertension: Secondary | ICD-10-CM | POA: Diagnosis not present

## 2012-08-10 DIAGNOSIS — M159 Polyosteoarthritis, unspecified: Secondary | ICD-10-CM | POA: Diagnosis not present

## 2012-08-10 DIAGNOSIS — E119 Type 2 diabetes mellitus without complications: Secondary | ICD-10-CM | POA: Diagnosis not present

## 2012-08-10 DIAGNOSIS — I5023 Acute on chronic systolic (congestive) heart failure: Secondary | ICD-10-CM | POA: Diagnosis not present

## 2012-08-12 DIAGNOSIS — E119 Type 2 diabetes mellitus without complications: Secondary | ICD-10-CM | POA: Diagnosis not present

## 2012-08-12 DIAGNOSIS — I2510 Atherosclerotic heart disease of native coronary artery without angina pectoris: Secondary | ICD-10-CM | POA: Diagnosis not present

## 2012-08-12 DIAGNOSIS — I5023 Acute on chronic systolic (congestive) heart failure: Secondary | ICD-10-CM | POA: Diagnosis not present

## 2012-08-12 DIAGNOSIS — M159 Polyosteoarthritis, unspecified: Secondary | ICD-10-CM | POA: Diagnosis not present

## 2012-08-12 DIAGNOSIS — I10 Essential (primary) hypertension: Secondary | ICD-10-CM | POA: Diagnosis not present

## 2012-08-12 DIAGNOSIS — I509 Heart failure, unspecified: Secondary | ICD-10-CM | POA: Diagnosis not present

## 2012-08-17 DIAGNOSIS — I509 Heart failure, unspecified: Secondary | ICD-10-CM | POA: Diagnosis not present

## 2012-08-17 DIAGNOSIS — M159 Polyosteoarthritis, unspecified: Secondary | ICD-10-CM | POA: Diagnosis not present

## 2012-08-17 DIAGNOSIS — I5023 Acute on chronic systolic (congestive) heart failure: Secondary | ICD-10-CM | POA: Diagnosis not present

## 2012-08-17 DIAGNOSIS — E119 Type 2 diabetes mellitus without complications: Secondary | ICD-10-CM | POA: Diagnosis not present

## 2012-08-17 DIAGNOSIS — I2510 Atherosclerotic heart disease of native coronary artery without angina pectoris: Secondary | ICD-10-CM | POA: Diagnosis not present

## 2012-08-17 DIAGNOSIS — I10 Essential (primary) hypertension: Secondary | ICD-10-CM | POA: Diagnosis not present

## 2012-08-18 ENCOUNTER — Encounter: Payer: Self-pay | Admitting: Physical Medicine & Rehabilitation

## 2012-08-18 ENCOUNTER — Encounter: Payer: Medicare Other | Attending: Physical Medicine & Rehabilitation | Admitting: Physical Medicine & Rehabilitation

## 2012-08-18 VITALS — BP 134/73 | HR 61 | Resp 17 | Ht 72.0 in | Wt 340.0 lb

## 2012-08-18 DIAGNOSIS — I2510 Atherosclerotic heart disease of native coronary artery without angina pectoris: Secondary | ICD-10-CM | POA: Insufficient documentation

## 2012-08-18 DIAGNOSIS — G609 Hereditary and idiopathic neuropathy, unspecified: Secondary | ICD-10-CM | POA: Insufficient documentation

## 2012-08-18 DIAGNOSIS — R5381 Other malaise: Secondary | ICD-10-CM | POA: Insufficient documentation

## 2012-08-18 DIAGNOSIS — I509 Heart failure, unspecified: Secondary | ICD-10-CM | POA: Diagnosis not present

## 2012-08-18 DIAGNOSIS — I5032 Chronic diastolic (congestive) heart failure: Secondary | ICD-10-CM

## 2012-08-18 DIAGNOSIS — M159 Polyosteoarthritis, unspecified: Secondary | ICD-10-CM | POA: Insufficient documentation

## 2012-08-18 DIAGNOSIS — I10 Essential (primary) hypertension: Secondary | ICD-10-CM | POA: Diagnosis not present

## 2012-08-18 DIAGNOSIS — E119 Type 2 diabetes mellitus without complications: Secondary | ICD-10-CM | POA: Diagnosis not present

## 2012-08-18 DIAGNOSIS — G894 Chronic pain syndrome: Secondary | ICD-10-CM | POA: Diagnosis not present

## 2012-08-18 DIAGNOSIS — I5030 Unspecified diastolic (congestive) heart failure: Secondary | ICD-10-CM | POA: Diagnosis not present

## 2012-08-18 DIAGNOSIS — I5023 Acute on chronic systolic (congestive) heart failure: Secondary | ICD-10-CM | POA: Diagnosis not present

## 2012-08-18 MED ORDER — GABAPENTIN 300 MG PO CAPS: 300.0000 mg | ORAL_CAPSULE | Freq: Two times a day (BID) | ORAL | Status: DC

## 2012-08-18 NOTE — Patient Instructions (Signed)
TRY INCREASING YOUR GABAPENTIN TO 300MG  TWICE DAILY. IF YOU EXPERIENCE FLUID GAIN OR INCREASED FATIGUE THEN GO BACK TO ONCE DAILY AT BEDTIME
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
## 2012-08-20 DIAGNOSIS — M159 Polyosteoarthritis, unspecified: Secondary | ICD-10-CM | POA: Diagnosis not present

## 2012-08-20 DIAGNOSIS — I509 Heart failure, unspecified: Secondary | ICD-10-CM | POA: Diagnosis not present

## 2012-08-20 DIAGNOSIS — I5023 Acute on chronic systolic (congestive) heart failure: Secondary | ICD-10-CM | POA: Diagnosis not present

## 2012-08-20 DIAGNOSIS — E119 Type 2 diabetes mellitus without complications: Secondary | ICD-10-CM | POA: Diagnosis not present

## 2012-08-20 DIAGNOSIS — I10 Essential (primary) hypertension: Secondary | ICD-10-CM | POA: Diagnosis not present

## 2012-08-20 DIAGNOSIS — I2510 Atherosclerotic heart disease of native coronary artery without angina pectoris: Secondary | ICD-10-CM | POA: Diagnosis not present

## 2012-08-23 ENCOUNTER — Other Ambulatory Visit: Payer: Self-pay

## 2012-08-30 DIAGNOSIS — D485 Neoplasm of uncertain behavior of skin: Secondary | ICD-10-CM | POA: Diagnosis not present

## 2012-08-30 DIAGNOSIS — L820 Inflamed seborrheic keratosis: Secondary | ICD-10-CM | POA: Diagnosis not present

## 2012-08-30 DIAGNOSIS — L821 Other seborrheic keratosis: Secondary | ICD-10-CM | POA: Diagnosis not present

## 2012-08-31 DIAGNOSIS — R944 Abnormal results of kidney function studies: Secondary | ICD-10-CM | POA: Diagnosis not present

## 2012-09-02 ENCOUNTER — Ambulatory Visit (HOSPITAL_COMMUNITY)
Admission: RE | Admit: 2012-09-02 | Discharge: 2012-09-02 | Disposition: A | Discharge status: Home / Self Care | Payer: Medicare Other | Source: Ambulatory Visit | Attending: Internal Medicine | Admitting: Internal Medicine

## 2012-09-02 VITALS — BP 140/72 | HR 65 | Resp 18 | Ht 72.0 in | Wt 335.2 lb

## 2012-09-02 DIAGNOSIS — I471 Supraventricular tachycardia, unspecified: Secondary | ICD-10-CM | POA: Insufficient documentation

## 2012-09-02 DIAGNOSIS — Z79899 Other long term (current) drug therapy: Secondary | ICD-10-CM | POA: Diagnosis not present

## 2012-09-02 DIAGNOSIS — I2510 Atherosclerotic heart disease of native coronary artery without angina pectoris: Secondary | ICD-10-CM | POA: Insufficient documentation

## 2012-09-02 DIAGNOSIS — E785 Hyperlipidemia, unspecified: Secondary | ICD-10-CM | POA: Diagnosis not present

## 2012-09-02 DIAGNOSIS — I872 Venous insufficiency (chronic) (peripheral): Secondary | ICD-10-CM | POA: Insufficient documentation

## 2012-09-02 DIAGNOSIS — IMO0001 Reserved for inherently not codable concepts without codable children: Secondary | ICD-10-CM | POA: Diagnosis not present

## 2012-09-02 DIAGNOSIS — E119 Type 2 diabetes mellitus without complications: Secondary | ICD-10-CM | POA: Diagnosis not present

## 2012-09-02 DIAGNOSIS — I10 Essential (primary) hypertension: Secondary | ICD-10-CM | POA: Diagnosis not present

## 2012-09-02 DIAGNOSIS — Z794 Long term (current) use of insulin: Secondary | ICD-10-CM | POA: Insufficient documentation

## 2012-09-02 DIAGNOSIS — Z7982 Long term (current) use of aspirin: Secondary | ICD-10-CM | POA: Diagnosis not present

## 2012-09-02 DIAGNOSIS — M129 Arthropathy, unspecified: Secondary | ICD-10-CM | POA: Insufficient documentation

## 2012-09-02 DIAGNOSIS — G4733 Obstructive sleep apnea (adult) (pediatric): Secondary | ICD-10-CM | POA: Insufficient documentation

## 2012-09-02 DIAGNOSIS — I5032 Chronic diastolic (congestive) heart failure: Secondary | ICD-10-CM | POA: Diagnosis not present

## 2012-09-02 DIAGNOSIS — I5033 Acute on chronic diastolic (congestive) heart failure: Secondary | ICD-10-CM

## 2012-09-02 NOTE — Assessment & Plan Note (Addendum)
Still with some volume on board but weight continue to drop slowly. Functional status improving. Able to perform ADLs without dyspnea. Continue current diuretic regimen. Reinforced need for daily weights and reviewed use of sliding scale diuretics. Follow up in 3 months.

## 2012-09-02 NOTE — Patient Instructions (Addendum)
Follow up in 3 months   Do the following things EVERYDAY: 1) Weigh yourself in the morning before breakfast. Write it down and keep it in a log. 2) Take your medicines as prescribed 3) Eat low salt foods-Limit salt (sodium) to 2000 mg per day.  4) Stay as active as you can everyday 5) Limit all fluids for the day to less than 2 liters  

## 2012-09-06 DIAGNOSIS — H4011X0 Primary open-angle glaucoma, stage unspecified: Secondary | ICD-10-CM | POA: Diagnosis not present

## 2012-09-06 DIAGNOSIS — E1139 Type 2 diabetes mellitus with other diabetic ophthalmic complication: Secondary | ICD-10-CM | POA: Diagnosis not present

## 2012-09-06 DIAGNOSIS — H25019 Cortical age-related cataract, unspecified eye: Secondary | ICD-10-CM | POA: Diagnosis not present

## 2012-09-06 DIAGNOSIS — H2510 Age-related nuclear cataract, unspecified eye: Secondary | ICD-10-CM | POA: Diagnosis not present

## 2012-09-06 NOTE — Progress Notes (Signed)
Patient ID: [name], male   DOB: 1931-06-30, 79 y.o.   MRN: [id number]  Weight Range  370 pounds  Baseline proBNP   Cardiologist: Dr Diona Browner. PCP: Dr Margo Aye   HPI: [name] is a 67 year with a PMH of morbid obesity, chronic diastolic heart failure EF 50% (previous EF 60-65%), HTN, SVT/PSVT/PAT, CAD, DMII, OSA-CPAP, arthritis, fibromyalgia, and hyperlipidemia.   Admitted 2/26 with HF decompensation.  He was diuresed with IV lasix and transitioned to Demadex 60 mg daily. He required short course on inpatient rehab. Discharge weight 370 pounds.   He returns for follow up today.  Overall he feels great.  Able to perform ADLs independently.  Denies SOB/PND/Orthopnea. Weight at home trending down from 336 to 328 pounds.   He is following low salt diet and limiting fluid intake. his diet strictly.  No extra fluid pills.  SBP 110-169 DBP 62-84. Complaint with mediations.   ROS: All systems negative except as listed in HPI, PMH and Problem List.  Past Medical History  Diagnosis Date  . PSVT (paroxysmal supraventricular tachycardia)     AVNRT  . Coronary atherosclerosis of native coronary artery     Nonobstructive, LVEF 65%  . Hyperlipidemia   . Morbid obesity   . Arthritis   . Type 2 diabetes mellitus   . Vertigo   . Venous insufficiency   . Sleep apnea, obstructive   . Chronic diastolic heart failure   . Shortness of breath   . CHF (congestive heart failure)     Current Outpatient Prescriptions  Medication Sig Dispense Refill  . aspirin 81 MG chewable tablet Chew 2 tablets (162 mg total) by mouth daily.      . cetirizine (ZYRTEC) 10 MG tablet Take 10 mg by mouth every evening.       . Cholecalciferol (D-3-5) 5000 UNITS capsule Take 5,000 Units by mouth daily.        . fluticasone (FLONASE) 50 MCG/ACT nasal spray Place 2 sprays into the nose daily.       Marland Kitchen gabapentin (NEURONTIN) 300 MG capsule Take 1 capsule (300 mg total) by mouth 2 (two) times daily.  60 capsule  3  .  HYDROcodone-acetaminophen (NORCO/VICODIN) 5-325 MG per tablet Take 1 tablet by mouth every 6 (six) hours as needed for pain.  30 tablet  0  . insulin glargine (LANTUS) 100 UNIT/ML injection Inject 45 Units into the skin 2 (two) times daily.  10 mL  1  . labetalol (NORMODYNE) 200 MG tablet Take 1 tablet (200 mg total) by mouth 2 (two) times daily.  60 tablet  1  . latanoprost (XALATAN) 0.005 % ophthalmic solution Place 1 drop into the right eye at bedtime.       . lidocaine (LIDODERM) 5 % Place 1 patch onto the skin daily as needed. Remove & Discard patch within 12 hours or as directed by MD.  30 patch  0  . montelukast (SINGULAIR) 10 MG tablet Take 10 mg by mouth at bedtime.        . Multiple Vitamins-Minerals (CENTRUM SILVER PO) Take 1 tablet by mouth daily.        . simvastatin (ZOCOR) 40 MG tablet Take 40 mg by mouth at bedtime.        . Tamsulosin HCl (FLOMAX) 0.4 MG CAPS Take 0.4 mg by mouth at bedtime.       . torsemide (DEMADEX) 20 MG tablet Take 3 tablets (60 mg total) by mouth daily.  30 tablet  1   No current facility-administered medications for this encounter.     PHYSICAL EXAM: Filed Vitals:   09/02/12 1441  BP: 140/72  Pulse: 65  Resp: 18  Height: 6' (1.829 m)  Weight: 335 lb 4 oz (152.068 kg)  SpO2: 94%   Physical Exam:  General: Morbidly obese. Sitting in wheelchair.  No resp difficulty Wife present HEENT: normal except for blind in L eye  Neck: supple. JVP hard to see but does not appear elevated. Carotids 2+ bilat; no bruits. No lymphadenopathy or thryomegaly appreciated.  Cor: PMI nonpalpable. Regular rate & rhythm. No rubs, gallops or murmurs.  Lungs: clear  Abdomen: morbidly obese, soft, nontender, nondistended. . No bruits or masses. Good bowel sounds.  Extremities: no cyanosis, clubbing, rash, RLE tr+ LLE 2+ edema severe chronic venous stasis changes.   Bilateral TED hose  Neuro: alert & orientedx3, cranial nerves grossly intact. moves all 4 extremities w/o  difficulty. Affect pleasant      ASSESSMENT & PLAN:

## 2012-09-07 DIAGNOSIS — I2510 Atherosclerotic heart disease of native coronary artery without angina pectoris: Secondary | ICD-10-CM | POA: Diagnosis not present

## 2012-09-07 DIAGNOSIS — M159 Polyosteoarthritis, unspecified: Secondary | ICD-10-CM | POA: Diagnosis not present

## 2012-09-07 DIAGNOSIS — I509 Heart failure, unspecified: Secondary | ICD-10-CM | POA: Diagnosis not present

## 2012-09-07 DIAGNOSIS — E119 Type 2 diabetes mellitus without complications: Secondary | ICD-10-CM | POA: Diagnosis not present

## 2012-09-07 DIAGNOSIS — I5023 Acute on chronic systolic (congestive) heart failure: Secondary | ICD-10-CM | POA: Diagnosis not present

## 2012-09-07 DIAGNOSIS — I10 Essential (primary) hypertension: Secondary | ICD-10-CM | POA: Diagnosis not present

## 2012-09-15 ENCOUNTER — Telehealth: Payer: Self-pay | Admitting: Cardiology

## 2012-09-15 NOTE — Telephone Encounter (Signed)
New Prob     Following up on fax that was sent over on 5/14 regarding plan of care forms.

## 2012-09-15 NOTE — Telephone Encounter (Signed)
Forwarded to [employer]  Unable to locate forms.

## 2012-09-16 NOTE — Telephone Encounter (Signed)
Phone number is incorrect in the message - should be [phone]  Aware Dr Antoine Poche

## 2012-09-16 NOTE — Telephone Encounter (Signed)
Aware Dr Antoine Poche has paperwork and it will be faxed as soon as it has been signed.

## 2012-09-17 ENCOUNTER — Telehealth: Payer: Self-pay

## 2012-09-17 NOTE — Telephone Encounter (Signed)
Patient asking when he needs to follow up with Dr. Diona Browner.  Did not see in plan of care

## 2012-09-21 NOTE — Telephone Encounter (Signed)
Patient informed and appointment given to patient.

## 2012-09-21 NOTE — Telephone Encounter (Signed)
He has been followed in the CHF clinic, seen in early May. He is to see them back in 3 months. Please schedule a visit with me over the next 6 weeks.

## 2012-10-04 DIAGNOSIS — E119 Type 2 diabetes mellitus without complications: Secondary | ICD-10-CM | POA: Diagnosis not present

## 2012-10-04 DIAGNOSIS — E1149 Type 2 diabetes mellitus with other diabetic neurological complication: Secondary | ICD-10-CM | POA: Diagnosis not present

## 2012-11-18 DIAGNOSIS — G4733 Obstructive sleep apnea (adult) (pediatric): Secondary | ICD-10-CM | POA: Diagnosis not present

## 2012-11-18 DIAGNOSIS — E785 Hyperlipidemia, unspecified: Secondary | ICD-10-CM | POA: Diagnosis not present

## 2012-11-18 DIAGNOSIS — E119 Type 2 diabetes mellitus without complications: Secondary | ICD-10-CM | POA: Diagnosis not present

## 2012-11-18 DIAGNOSIS — I10 Essential (primary) hypertension: Secondary | ICD-10-CM | POA: Diagnosis not present

## 2012-11-19 ENCOUNTER — Ambulatory Visit (INDEPENDENT_AMBULATORY_CARE_PROVIDER_SITE_OTHER): Payer: Medicare Other | Admitting: Cardiology

## 2012-11-19 ENCOUNTER — Encounter: Payer: Self-pay | Admitting: Cardiology

## 2012-11-19 VITALS — BP 135/74 | HR 64 | Ht 72.0 in | Wt 326.0 lb

## 2012-11-19 DIAGNOSIS — I2510 Atherosclerotic heart disease of native coronary artery without angina pectoris: Secondary | ICD-10-CM

## 2012-11-19 DIAGNOSIS — I5032 Chronic diastolic (congestive) heart failure: Secondary | ICD-10-CM

## 2012-11-19 DIAGNOSIS — I471 Supraventricular tachycardia: Secondary | ICD-10-CM

## 2012-11-19 NOTE — Progress Notes (Signed)
Clinical Summary Mr. Fawbush is a 79 y.o.male last seen in February at which time he was admitted for inpatient management of marked fluid overload secondary to diastolic heart failure. He was managed by the advanced heart failure team and diuresed substantially, ultimately transitioned from IV Lasix to Demadex, discharge weight was 370 pounds. He was seen in the heart failure clinic in May.  He is here with his wife today, he looks much better than last time I saw him. Weight today is 326, down from 335 in May. He reports compliance with medications, has been trying to follow diet and weight loss, and also fluid restriction. Still has leg edema, although much improved, using compression dose. Left is worse than right.  He will be.seen in the advanced heart failure clinic next month.  Echocardiogram from February revealed moderate LVH with LVEF 50%, grade 2 diastolic dysfunction, very mild aortic stenosis, mild mitral stenosis, moderate left atrial enlargement.   Allergies  Allergen Reactions  . Ambien (Zolpidem Tartrate)     Makes him cuss like a sailor  . Lyrica (Pregabalin)     Causes him to have fluid  . Penicillins Hives    nightmares  . Piroxicam   . Sulfonamide Derivatives     Night sweats    Current Outpatient Prescriptions  Medication Sig Dispense Refill  . aspirin 81 MG chewable tablet Chew 2 tablets (162 mg total) by mouth daily.      . cetirizine (ZYRTEC) 10 MG tablet Take 10 mg by mouth every evening.       . Cholecalciferol (D-3-5) 5000 UNITS capsule Take 5,000 Units by mouth daily.        . fluticasone (FLONASE) 50 MCG/ACT nasal spray Place 2 sprays into the nose daily.       Marland Kitchen gabapentin (NEURONTIN) 300 MG capsule Take 1 capsule (300 mg total) by mouth 2 (two) times daily.  60 capsule  3  . HYDROcodone-acetaminophen (NORCO/VICODIN) 5-325 MG per tablet Take 1 tablet by mouth every 6 (six) hours as needed for pain.  30 tablet  0  . insulin glargine (LANTUS) 100 UNIT/ML  injection Inject 45 Units into the skin 2 (two) times daily.  10 mL  1  . labetalol (NORMODYNE) 200 MG tablet Take 1 tablet (200 mg total) by mouth 2 (two) times daily.  60 tablet  1  . latanoprost (XALATAN) 0.005 % ophthalmic solution Place 1 drop into the right eye at bedtime.       . lidocaine (LIDODERM) 5 % Place 1 patch onto the skin daily as needed. Remove & Discard patch within 12 hours or as directed by MD.  30 patch  0  . montelukast (SINGULAIR) 10 MG tablet Take 10 mg by mouth at bedtime.        . Multiple Vitamins-Minerals (CENTRUM SILVER PO) Take 1 tablet by mouth daily.        . simvastatin (ZOCOR) 40 MG tablet Take 40 mg by mouth at bedtime.        . Tamsulosin HCl (FLOMAX) 0.4 MG CAPS Take 0.4 mg by mouth at bedtime.       . torsemide (DEMADEX) 20 MG tablet Take 3 tablets (60 mg total) by mouth daily.  30 tablet  1   No current facility-administered medications for this visit.    Past Medical History  Diagnosis Date  . PSVT (paroxysmal supraventricular tachycardia)     AVNRT  . Coronary atherosclerosis of native coronary artery  Nonobstructive, LVEF 65%  . Hyperlipidemia   . Morbid obesity   . Arthritis   . Type 2 diabetes mellitus   . Vertigo   . Venous insufficiency   . Sleep apnea, obstructive   . Chronic diastolic heart failure     Social History Mr. Cohron reports that he has never smoked. He has never used smokeless tobacco. Mr. Fowle reports that he does not drink alcohol.  Review of Systems Has chronic pain on an intermittent basis and uses hydrocodone. He has been seen in the pain management clinic. Reports no palpitations, no angina. Currently no orthopnea or PND. Otherwise negative.  Physical Examination Filed Vitals:   11/19/12 1403  BP: 135/74  Pulse: 64   Filed Weights   11/19/12 1403  Weight: 326 lb (147.873 kg)    Morbidly obese, comfortable at rest. HEENT: Conjunctiva and lids normal, oropharynx clear. Neck: Supple, increased girth,  no carotid bruits, no thyromegaly. Lungs: Clear to auscultation, nonlabored breathing at rest. Cardiac: Regular rate and rhythm, no S3, 2/6 systolic murmur, no pericardial rub. Abdomen: Soft, nontender, obesey, bowel sounds present. Extremities: Chronic appearing edema, compression hose on, left worse than right. Skin: Warm and dry. Musculoskeletal: No kyphosis. Neuropsychiatric: Alert and oriented x3, affect grossly appropriate.   Problem List and Plan   Chronic diastolic heart failure He has had substantial improvement since I last saw him. He seems to be motivated to try and lose more weight. He will be following up in the advanced heart failure clinic next month. We may try to see him back alternating with them.  CAD, NATIVE VESSEL Nonobstructive, no active angina symptoms.  SVT/ PSVT/ PAT Quiescent, no complaints of palpitations. He continues on labetalol.    Jonelle Sidle, M.D., F.A.C.C.

## 2012-11-19 NOTE — Assessment & Plan Note (Signed)
Quiescent, no complaints of palpitations. He continues on labetalol.

## 2012-11-19 NOTE — Assessment & Plan Note (Signed)
Nonobstructive, no active angina symptoms.

## 2012-11-19 NOTE — Assessment & Plan Note (Signed)
He has had substantial improvement since I last saw him. He seems to be motivated to try and lose more weight. He will be following up in the advanced heart failure clinic next month. We may try to see him back alternating with them.

## 2012-11-19 NOTE — Patient Instructions (Addendum)
Your physician recommends that you schedule a follow-up appointment in: 8 weeks  Your physician recommends that you continue on your current medications as directed. Please refer to the Current Medication list given to you today.   

## 2012-11-22 ENCOUNTER — Encounter: Payer: Self-pay | Admitting: Cardiology

## 2012-11-22 DIAGNOSIS — T1490XA Injury, unspecified, initial encounter: Secondary | ICD-10-CM | POA: Diagnosis not present

## 2012-12-07 ENCOUNTER — Ambulatory Visit (HOSPITAL_COMMUNITY)
Admission: RE | Admit: 2012-12-07 | Discharge: 2012-12-07 | Disposition: A | Discharge status: Home / Self Care | Payer: Medicare Other | Source: Ambulatory Visit | Attending: Internal Medicine | Admitting: Internal Medicine

## 2012-12-07 ENCOUNTER — Encounter (HOSPITAL_COMMUNITY): Payer: Self-pay

## 2012-12-07 VITALS — BP 142/60 | HR 64 | Wt 334.5 lb

## 2012-12-07 DIAGNOSIS — I5032 Chronic diastolic (congestive) heart failure: Secondary | ICD-10-CM

## 2012-12-07 DIAGNOSIS — I10 Essential (primary) hypertension: Secondary | ICD-10-CM

## 2012-12-07 LAB — BASIC METABOLIC PANEL
BUN: 15 mg/dL (ref 6–23)
Chloride: 107 mEq/L (ref 96–112)
GFR calc non Af Amer: 51 mL/min — ABNORMAL LOW (ref >90)
Glucose, Bld: 95 mg/dL (ref 70–99)
Potassium: 3.5 mEq/L (ref 3.5–5.1)
Sodium: 142 mEq/L (ref 135–145)

## 2012-12-07 MED ORDER — AMLODIPINE BESYLATE 5 MG PO TABS: 5.0000 mg | ORAL_TABLET | Freq: Every day | ORAL | Status: DC

## 2012-12-07 MED ORDER — TORSEMIDE 20 MG PO TABS: ORAL_TABLET | ORAL | Status: DC

## 2012-12-07 NOTE — Progress Notes (Signed)
Patient ID: [name], male   DOB: April 30, 1931, 79 y.o.   MRN: [id number]  Weight Range  370 pounds  Baseline proBNP   Cardiologist: Dr Diona Browner. PCP: Dr Margo Aye   HPI: [name] is a 79 year with a PMH of morbid obesity, chronic diastolic heart failure EF 50% (previous EF 60-65%), HTN, SVT/PSVT/PAT, CAD, DMII, OSA-CPAP, arthritis, fibromyalgia, and hyperlipidemia.   Admitted 2/26 with HF decompensation.  He was diuresed with IV lasix and transitioned to Demadex 60 mg daily. He required short course on inpatient rehab. Discharge weight 370 pounds.   Follow up: Larey Seat 2 weeks ago when he was not using walker. Weights at home 327-331 lbs. Overall he feels pretty good. Denies SOB/PND/Orthopnea/CP.  He is following low salt diet and limiting fluid intake. No extra fluid pills.  SBP 110-170 DBP 60-90s. HR 50-60s. Complaint with mediations.   ROS: All systems negative except as listed in HPI, PMH and Problem List.  Past Medical History  Diagnosis Date  . PSVT (paroxysmal supraventricular tachycardia)     AVNRT  . Coronary atherosclerosis of native coronary artery     Nonobstructive, LVEF 65%  . Hyperlipidemia   . Morbid obesity   . Arthritis   . Type 2 diabetes mellitus   . Vertigo   . Venous insufficiency   . Sleep apnea, obstructive   . Chronic diastolic heart failure     Current Outpatient Prescriptions  Medication Sig Dispense Refill  . aspirin 81 MG chewable tablet Chew 2 tablets (162 mg total) by mouth daily.      . cetirizine (ZYRTEC) 10 MG tablet Take 10 mg by mouth every evening.       . Cholecalciferol (D-3-5) 5000 UNITS capsule Take 5,000 Units by mouth daily.        . fluticasone (FLONASE) 50 MCG/ACT nasal spray Place 2 sprays into the nose daily.       Marland Kitchen gabapentin (NEURONTIN) 300 MG capsule Take 1 capsule (300 mg total) by mouth 2 (two) times daily.  60 capsule  3  . HYDROcodone-acetaminophen (NORCO/VICODIN) 5-325 MG per tablet Take 1 tablet by mouth every 6 (six) hours as  needed for pain.  30 tablet  0  . insulin glargine (LANTUS) 100 UNIT/ML injection Inject 45 Units into the skin 2 (two) times daily.  10 mL  1  . labetalol (NORMODYNE) 200 MG tablet Take 1 tablet (200 mg total) by mouth 2 (two) times daily.  60 tablet  1  . latanoprost (XALATAN) 0.005 % ophthalmic solution Place 1 drop into the right eye at bedtime.       . lidocaine (LIDODERM) 5 % Place 1 patch onto the skin daily as needed. Remove & Discard patch within 12 hours or as directed by MD.  30 patch  0  . montelukast (SINGULAIR) 10 MG tablet Take 10 mg by mouth at bedtime.        . Multiple Vitamins-Minerals (CENTRUM SILVER PO) Take 1 tablet by mouth daily.        . simvastatin (ZOCOR) 40 MG tablet Take 40 mg by mouth at bedtime.        . Tamsulosin HCl (FLOMAX) 0.4 MG CAPS Take 0.4 mg by mouth at bedtime.       . torsemide (DEMADEX) 20 MG tablet Take 3 tablets (60 mg total) by mouth daily.  30 tablet  1   No current facility-administered medications for this encounter.   PHYSICAL EXAM: Filed Vitals:   12/07/12 1337  BP: 142/60  Pulse: 64  SpO2: 97%  Last weight 335 lbs.   Physical Exam:  General: Morbidly obese. Sitting in wheelchair.  No resp difficulty Wife present in wheelchair HEENT: normal except for blind in L eye  Neck: supple. JVP hard to see d/t body habitus. Carotids 2+ bilat; no bruits. No lymphadenopathy or thryomegaly appreciated.  Cor: PMI nonpalpable. Regular rate & rhythm. No rubs, gallops or murmurs.  Lungs: clear  Abdomen: morbidly obese, soft, nontender, +distended. . No bruits or masses. Good bowel sounds.  Extremities: no cyanosis, clubbing, rash, RLE 1+ LLE 2-3+ edema severe chronic venous stasis changes.   Bilateral TED hose  Neuro: alert & orientedx3, cranial nerves grossly intact. moves all 4 extremities w/o difficulty. Affect pleasant    ASSESSMENT & PLAN:  1) Chronic diastolic HF, 50% - Doing pretty good and performing all ADLs. Strength is getting better  and he is currently using a walker with no SOB. Volume status still slightly elevated.  - will increase torsemide to 80 mg for 2 days. Take extra torsemide if weight above 329 lbs at home.  - BMET now.   2) OSA - Continues to wear biPAP at night, continue  3) HTN - SBP 110-180s. Currently on labetalol 200 mg BID. - Will start norvasc 5 mg daily and can titrate next visit if needed. - not on an ACE-I with DM2, Cr baseline 1.5-1.7  F/U 2 months  Ulla Potash B 1:58 PM

## 2012-12-07 NOTE — Patient Instructions (Addendum)
Increase torsemide to 80 mg for 2 days. If weight is above 329 lbs on home scale take an extra 20 mg torsemide. If you are having to take a lot of extra fluid pills call the clinic [phone].   Start 5 mg norvasc.  Will call with lab results.   Do the following things EVERYDAY: 1) Weigh yourself in the morning before breakfast. Write it down and keep it in a log. 2) Take your medicines as prescribed 3) Eat low salt foods-Limit salt (sodium) to 2000 mg per day.  4) Stay as active as you can everyday 5) Limit all fluids for the day to less than 2 liters   F/U 2 months

## 2012-12-09 ENCOUNTER — Telehealth (HOSPITAL_COMMUNITY): Payer: Self-pay | Admitting: Anesthesiology

## 2012-12-09 MED ORDER — POTASSIUM CHLORIDE ER 20 MEQ PO TBCR: 20.0000 meq | EXTENDED_RELEASE_TABLET | Freq: Every day | ORAL | Status: DC

## 2012-12-09 NOTE — Telephone Encounter (Signed)
K+ 3.5 from this weeks appt. Will start patient on 20 meq potassium daily and recheck in 1 month at Dr. Marcelo Baldy office. Patient aware.

## 2012-12-16 ENCOUNTER — Telehealth: Payer: Self-pay | Admitting: Cardiology

## 2012-12-16 NOTE — Telephone Encounter (Signed)
Yes we can review ECG.

## 2012-12-16 NOTE — Telephone Encounter (Signed)
HAD SHARPE PAINS AROUND HIS HEART FOR ABOUT 15 MINUTES. He is fine now but feels like should come in to have an EKG to check everything out.

## 2012-12-16 NOTE — Telephone Encounter (Signed)
Called pt and scheduled nurse visit for EKG for 8-22 at 9:15.

## 2012-12-16 NOTE — Telephone Encounter (Signed)
Patient c/o having 2 episodes of mild chest pain rated 1&1/2 on a scale of 1-10 (10 being the greatest) since last visit. No c/o sob, or dizziness. Patient would like to come to office just for EKG. Nurse advised patient that coming in for EKG could be arranged without seeing MD and MD would be notified if this was okay.

## 2012-12-17 ENCOUNTER — Ambulatory Visit (INDEPENDENT_AMBULATORY_CARE_PROVIDER_SITE_OTHER): Payer: Medicare Other | Admitting: *Deleted

## 2012-12-17 VITALS — BP 130/74 | HR 59 | Ht 72.0 in | Wt 326.0 lb

## 2012-12-17 DIAGNOSIS — I2510 Atherosclerotic heart disease of native coronary artery without angina pectoris: Secondary | ICD-10-CM

## 2012-12-17 NOTE — Progress Notes (Signed)
ECG reviewed, no significant changes. Also looked at home weights which have been stable.

## 2012-12-17 NOTE — Progress Notes (Signed)
Patient presents to office for EKG per patient request. Patient c/o mild chest pain per telephone message. Patient informed nurse that he fell in his driveway a week ago and has felt some sob since that time. Patient denies dizziness. Patient has taken all medications without missing any doses and no side effects noted.

## 2012-12-20 NOTE — Progress Notes (Signed)
Patient informed. Spoke with wife.

## 2013-01-03 DIAGNOSIS — E119 Type 2 diabetes mellitus without complications: Secondary | ICD-10-CM | POA: Diagnosis not present

## 2013-01-03 DIAGNOSIS — E1149 Type 2 diabetes mellitus with other diabetic neurological complication: Secondary | ICD-10-CM | POA: Diagnosis not present

## 2013-01-10 ENCOUNTER — Telehealth (HOSPITAL_COMMUNITY): Payer: Self-pay | Admitting: Cardiology

## 2013-01-10 NOTE — Telephone Encounter (Signed)
Pt c/o increase in weight. Pt weights 334.8 today LE edema- swelling is up to knees x 2 weeks Denies sob or abdominal swelling  Please advise

## 2013-01-10 NOTE — Telephone Encounter (Signed)
Spoke w/pt's wife she states wt has been up for about 2 weeks he is usually 329-331 at home, he increase Torsemide to 80 mg daily form 60 about a week ago but it has not helped, denies SOB but does have LE edema and states abd is tight and a little distended, will discuss with Dr Shirlee Latch and call him back

## 2013-01-10 NOTE — Telephone Encounter (Signed)
Pt can increase and take an extra Torsemide today and tomorrow to equal 5 tabs, pt aware and agreeable if wt up tomorrow he will let me know, he will call Wed AM with update

## 2013-01-11 DIAGNOSIS — L821 Other seborrheic keratosis: Secondary | ICD-10-CM | POA: Diagnosis not present

## 2013-01-11 DIAGNOSIS — D485 Neoplasm of uncertain behavior of skin: Secondary | ICD-10-CM | POA: Diagnosis not present

## 2013-01-12 NOTE — Telephone Encounter (Signed)
Spoke w/pt he states edema is better and wt was down to 332 today, he will call back on Fri with update

## 2013-01-14 NOTE — Telephone Encounter (Signed)
Pt called to report he is doing better, wt down to 329 today, he will cut torsemide back to 60 or 80 daily, f/u appt sch for 9/24

## 2013-01-19 ENCOUNTER — Ambulatory Visit (HOSPITAL_COMMUNITY)
Admission: RE | Admit: 2013-01-19 | Discharge: 2013-01-19 | Disposition: A | Discharge status: Home / Self Care | Payer: Medicare Other | Source: Ambulatory Visit | Attending: Internal Medicine | Admitting: Internal Medicine

## 2013-01-19 VITALS — BP 142/68 | HR 60 | Wt 333.5 lb

## 2013-01-19 DIAGNOSIS — G894 Chronic pain syndrome: Secondary | ICD-10-CM | POA: Diagnosis not present

## 2013-01-19 DIAGNOSIS — I5032 Chronic diastolic (congestive) heart failure: Secondary | ICD-10-CM | POA: Insufficient documentation

## 2013-01-19 LAB — BASIC METABOLIC PANEL
BUN: 20 mg/dL (ref 6–23)
CO2: 24 mEq/L (ref 19–32)
Chloride: 108 mEq/L (ref 96–112)
GFR calc Af Amer: 53 mL/min — ABNORMAL LOW (ref >90)
Potassium: 3.7 mEq/L (ref 3.5–5.1)

## 2013-01-19 MED ORDER — TORSEMIDE 20 MG PO TABS: ORAL_TABLET | ORAL | Status: DC

## 2013-01-19 MED ORDER — POTASSIUM CHLORIDE CRYS ER 20 MEQ PO TBCR: EXTENDED_RELEASE_TABLET | ORAL | Status: DC

## 2013-01-19 MED ORDER — AMLODIPINE BESYLATE 10 MG PO TABS: 10.0000 mg | ORAL_TABLET | Freq: Every day | ORAL | Status: DC

## 2013-01-19 MED ORDER — METOLAZONE 2.5 MG PO TABS: ORAL_TABLET | ORAL | Status: DC

## 2013-01-19 NOTE — Patient Instructions (Addendum)
Start Metolazone 2.5 mg every Wednesday and Sunday  Start Potassium (k-dur) 20 meq daily, except Wed and Sun take 2 tabs daily  We will contact you in 2 months to schedule your next appointment.

## 2013-01-25 NOTE — Progress Notes (Signed)
Patient ID: JERRE VANDRUNEN, male   DOB: 1931/05/13, 79 y.o.   MRN: [id number]   Weight Range  370 pounds  Baseline proBNP   Cardiologist: Dr Diona Browner. PCP: Dr Margo Aye   HPI: Mr Chirico is a 16 year with a PMH of morbid obesity, chronic diastolic heart failure EF 50% (previous EF 60-65%), HTN, SVT/PSVT/PAT, CAD, DMII, OSA-CPAP, arthritis, fibromyalgia, and hyperlipidemia.   Admitted 2/26 with HF decompensation.  He was diuresed with IV lasix and transitioned to Demadex 60 mg daily. He required short course on inpatient rehab. Discharge weight 370 pounds.   Follow up: Last visit started norvasc 5mg  daily. Weight was trending up in the past few weeks and he increased torsemide to 80 mg and then 100 mg for a few days. Back taking 80 mg daily. Has not taken any potassium. Weights at home 327-338 lbs. Feels pretty good. Denies SOB/PND/Orthopnea or CP.  He is following low salt diet and limiting fluid intake to less than 2 L a day. SBP 140-170 DBP 70-80s. HR 60s. Compliant with mediations.   ROS: All systems negative except as listed in HPI, PMH and Problem List.  Past Medical History  Diagnosis Date  . PSVT (paroxysmal supraventricular tachycardia)     AVNRT  . Coronary atherosclerosis of native coronary artery     Nonobstructive, LVEF 65%  . Hyperlipidemia   . Morbid obesity   . Arthritis   . Type 2 diabetes mellitus   . Vertigo   . Venous insufficiency   . Sleep apnea, obstructive   . Chronic diastolic heart failure     Current Outpatient Prescriptions  Medication Sig Dispense Refill  . amLODipine (NORVASC) 10 MG tablet Take 1 tablet (10 mg total) by mouth daily.  30 tablet  3  . aspirin 81 MG chewable tablet Chew 2 tablets (162 mg total) by mouth daily.      . cetirizine (ZYRTEC) 10 MG tablet Take 10 mg by mouth every evening.       . Cholecalciferol (D-3-5) 5000 UNITS capsule Take 5,000 Units by mouth daily.        . fluticasone (FLONASE) 50 MCG/ACT nasal spray Place 2 sprays into the  nose daily.       Marland Kitchen gabapentin (NEURONTIN) 300 MG capsule Take 1 capsule (300 mg total) by mouth 2 (two) times daily.  60 capsule  3  . HYDROcodone-acetaminophen (NORCO/VICODIN) 5-325 MG per tablet Take 1 tablet by mouth every 6 (six) hours as needed for pain.  30 tablet  0  . insulin glargine (LANTUS) 100 UNIT/ML injection Inject 30 Units into the skin 2 (two) times daily.      Marland Kitchen labetalol (NORMODYNE) 200 MG tablet Take 1 tablet (200 mg total) by mouth 2 (two) times daily.  60 tablet  1  . latanoprost (XALATAN) 0.005 % ophthalmic solution Place 1 drop into the right eye at bedtime.       . lidocaine (LIDODERM) 5 % Place 1 patch onto the skin daily as needed. Remove & Discard patch within 12 hours or as directed by MD.  30 patch  0  . montelukast (SINGULAIR) 10 MG tablet Take 10 mg by mouth at bedtime.        . Multiple Vitamins-Minerals (CENTRUM SILVER PO) Take 1 tablet by mouth daily.        . simvastatin (ZOCOR) 40 MG tablet Take 40 mg by mouth at bedtime.        . Tamsulosin HCl (FLOMAX) 0.4 MG CAPS  Take 0.4 mg by mouth at bedtime.       . torsemide (DEMADEX) 20 MG tablet Take 60 mg daily. If home weight greater than 329 lbs take an extra 20 mg.  100 tablet  6  . metolazone (ZAROXOLYN) 2.5 MG tablet Take 2.5 mg on Wednesdays and Sundays.  8 tablet  3  . potassium chloride SA (K-DUR,KLOR-CON) 20 MEQ tablet Take 1 tab daily except Wed and Sun take 2 tabs daily  45 tablet  6   No current facility-administered medications for this encounter.    Filed Vitals:   01/19/13 1528  BP: 142/68  Pulse: 60  Weight: 333 lb 8 oz (151.275 kg)  SpO2: 97%  Last weight 335 lbs.   Physical Exam:  General: Pleasant; Morbidly obese. Sitting in wheelchair.  No resp difficulty Wife present  HEENT: normal except for blind in L eye  Neck: supple. JVP hard to see d/t body habitus but appears 8-9 Carotids 2+ bilat; no bruits. No lymphadenopathy or thryomegaly appreciated.  Cor: PMI nonpalpable. Regular rate &  rhythm. No rubs, gallops or murmurs.  Lungs: clear  Abdomen: morbidly obese, soft, nontender, +distended. . No bruits or masses. Good bowel sounds.  Extremities: no cyanosis, clubbing, rash, bilateral 2-3+  LE edema severe chronic venous stasis changes.   Bilateral TED hose  Neuro: alert & orientedx3, cranial nerves grossly intact. moves all 4 extremities w/o difficulty. Affect pleasant    ASSESSMENT & PLAN:  1) Chronic diastolic HF, 50% - NYHA III symptoms. Volume status mildly increased, however patient has been using SS diuretics and it is slowly decreasing. Will keep torsemide at 60 mg daily and add 2.5 metolazone on Wednesdays and Sundays. Continue to take extra torsemide if weight at home above 329 lbs. Will need to watch renal function closely. - He is continuing to improve and is able to perform all ADLs. Can take extra torsemide if weight above 329 lbs at home.  - Will get BMET today and then in 2-3 weeks. - Reinforced the need and importance of daily weights, a low sodium diet, and fluid restriction (less than 2 L a day). Instructed to call the HF clinic if weight increases more than 3 lbs overnight or 5 lbs in a week.   2) OSA - Continues to wear biPAP at night, continue  3) HTN - SBP 140-180s. Currently on labetalol 200 mg BID and norvasc 5 mg. Will increase norvasc to 10 mg daily. Continue to keep record of BP, pulse and weights and bring to next visit. - not currently on ACE-I, can consider adding on low dose next visit. Baseline Cr 1.5-1.7  4) CRI, stage III - Baseline Cr 1.5-1.8 - Will check BMET today  F/U 2 months    Reuel Boom Bensimhon,MD 6:06 PM

## 2013-01-26 ENCOUNTER — Ambulatory Visit (INDEPENDENT_AMBULATORY_CARE_PROVIDER_SITE_OTHER): Payer: Medicare Other | Admitting: Cardiology

## 2013-01-26 ENCOUNTER — Ambulatory Visit: Payer: Medicare Other | Admitting: Cardiology

## 2013-01-26 ENCOUNTER — Encounter: Payer: Self-pay | Admitting: Cardiology

## 2013-01-26 VITALS — BP 128/68 | HR 66 | Ht 72.0 in | Wt 326.0 lb

## 2013-01-26 DIAGNOSIS — I5032 Chronic diastolic (congestive) heart failure: Secondary | ICD-10-CM | POA: Diagnosis not present

## 2013-01-26 DIAGNOSIS — I471 Supraventricular tachycardia: Secondary | ICD-10-CM | POA: Diagnosis not present

## 2013-01-26 DIAGNOSIS — I2510 Atherosclerotic heart disease of native coronary artery without angina pectoris: Secondary | ICD-10-CM | POA: Diagnosis not present

## 2013-01-26 NOTE — Assessment & Plan Note (Signed)
No changes made to recent medication adjustments. Patient is diuresing and with steady decrease in weight. He states that he will be following up with the CHF clinic by phone soon, I suspect he will have a visit within the next month. Will make a visit for 2 months.

## 2013-01-26 NOTE — Assessment & Plan Note (Signed)
History of nonobstructive disease, no obvious angina symptoms.

## 2013-01-26 NOTE — Assessment & Plan Note (Signed)
No palpitations to suspect recurring arrhythmia.

## 2013-01-26 NOTE — Progress Notes (Signed)
Clinical Summary [name] is a 79 y.o.male last seen in July. Interval visit in the CHF clinic noted just recently. Medications were modified to include intermittent use of metolazone, increase in Norvasc dosing. His weight had gone up at that time, got to a peak of 338 pounds by home checks.  Recent lab work showed potassium 3.7, BUN 20, creatinine 1.4, GFR 46. Weight is down to 326 from September. He shows me his home weights confirming a decrease over the last week. He states he feels better.  Also reports a very atypical left thoracic discomfort, was wondering whether it might be related to reflux or even musculoskeletal from using his walker to ambulate. He had a recent ECG showed sinus bradycardia with IVCD, no acute ST segment changes.  He reports compliance with his current medications. Blood pressure is better controlled today.   Allergies  Allergen Reactions  . Ambien [Zolpidem Tartrate]     Makes him cuss like a sailor  . Lyrica [Pregabalin]     Causes him to have fluid  . Penicillins Hives    nightmares  . Piroxicam   . Sulfonamide Derivatives     Night sweats    Current Outpatient Prescriptions  Medication Sig Dispense Refill  . amLODipine (NORVASC) 10 MG tablet Take 1 tablet (10 mg total) by mouth daily.  30 tablet  3  . aspirin 81 MG chewable tablet Chew 2 tablets (162 mg total) by mouth daily.      . cetirizine (ZYRTEC) 10 MG tablet Take 10 mg by mouth every evening.       . Cholecalciferol (D-3-5) 5000 UNITS capsule Take 5,000 Units by mouth daily.        . fluticasone (FLONASE) 50 MCG/ACT nasal spray Place 2 sprays into the nose daily.       Marland Kitchen gabapentin (NEURONTIN) 300 MG capsule Take 1 capsule (300 mg total) by mouth 2 (two) times daily.  60 capsule  3  . HYDROcodone-acetaminophen (NORCO/VICODIN) 5-325 MG per tablet Take 1 tablet by mouth every 6 (six) hours as needed for pain.  30 tablet  0  . insulin glargine (LANTUS) 100 UNIT/ML injection Inject 30 Units  into the skin 2 (two) times daily.      Marland Kitchen labetalol (NORMODYNE) 200 MG tablet Take 1 tablet (200 mg total) by mouth 2 (two) times daily.  60 tablet  1  . latanoprost (XALATAN) 0.005 % ophthalmic solution Place 1 drop into the right eye at bedtime.       . lidocaine (LIDODERM) 5 % Place 1 patch onto the skin daily as needed. Remove & Discard patch within 12 hours or as directed by MD.  30 patch  0  . metolazone (ZAROXOLYN) 2.5 MG tablet Take 2.5 mg on Wednesdays and Sundays.  8 tablet  3  . montelukast (SINGULAIR) 10 MG tablet Take 10 mg by mouth at bedtime.        . Multiple Vitamins-Minerals (CENTRUM SILVER PO) Take 1 tablet by mouth daily.        . potassium chloride SA (K-DUR,KLOR-CON) 20 MEQ tablet Take 1 tab daily except Wed and Sun take 2 tabs daily  45 tablet  6  . simvastatin (ZOCOR) 40 MG tablet Take 40 mg by mouth at bedtime.        . Tamsulosin HCl (FLOMAX) 0.4 MG CAPS Take 0.4 mg by mouth at bedtime.       . torsemide (DEMADEX) 20 MG tablet Take 60 mg daily.  If home weight greater than 329 lbs take an extra 20 mg.  100 tablet  6   No current facility-administered medications for this visit.    Past Medical History  Diagnosis Date  . PSVT (paroxysmal supraventricular tachycardia)     AVNRT  . Coronary atherosclerosis of native coronary artery     Nonobstructive, LVEF 65%  . Hyperlipidemia   . Morbid obesity   . Arthritis   . Type 2 diabetes mellitus   . Vertigo   . Venous insufficiency   . Sleep apnea, obstructive   . Chronic diastolic heart failure     Social History [name] reports that he has never smoked. He has never used smokeless tobacco. [name] reports that he does not drink alcohol.  Review of Systems No palpitations or syncope. No reported bleeding episodes. Stable appetite. Hard of hearing. Otherwise negative.  Physical Examination Filed Vitals:   01/26/13 1300  BP: 128/68  Pulse: 66   Filed Weights   01/26/13 1300  Weight: 326 lb (147.873 kg)      Morbidly obese, comfortable at rest.  HEENT: Conjunctiva and lids normal, oropharynx clear.  Neck: Supple, increased girth, difficult to appreciate JVP - looks to be normal , no carotid bruits, no thyromegaly.  Lungs: Clear to auscultation, nonlabored breathing at rest.  Cardiac: Regular rate and rhythm, no S3, 2/6 systolic murmur, no pericardial rub.  Abdomen: Soft, nontender, obesey, bowel sounds present.  Extremities: Chronic appearing edema, compression hose on.  Skin: Warm and dry.  Musculoskeletal: No kyphosis.  Neuropsychiatric: Alert and oriented x3, affect grossly appropriate.   Problem List and Plan   Chronic diastolic heart failure No changes made to recent medication adjustments. Patient is diuresing and with steady decrease in weight. He states that he will be following up with the CHF clinic by phone soon, I suspect he will have a visit within the next month. Will make a visit for 2 months.  SVT/ PSVT/ PAT No palpitations to suspect recurring arrhythmia.  CAD, NATIVE VESSEL History of nonobstructive disease, no obvious angina symptoms.    Jonelle Sidle, M.D., F.A.C.C.

## 2013-01-26 NOTE — Patient Instructions (Signed)
Continue all current medications. Follow up in  2 months  

## 2013-02-08 DIAGNOSIS — D485 Neoplasm of uncertain behavior of skin: Secondary | ICD-10-CM | POA: Diagnosis not present

## 2013-02-08 DIAGNOSIS — L570 Actinic keratosis: Secondary | ICD-10-CM | POA: Diagnosis not present

## 2013-02-08 DIAGNOSIS — L650 Telogen effluvium: Secondary | ICD-10-CM | POA: Diagnosis not present

## 2013-02-16 DIAGNOSIS — Z23 Encounter for immunization: Secondary | ICD-10-CM | POA: Diagnosis not present

## 2013-02-23 ENCOUNTER — Telehealth (HOSPITAL_COMMUNITY): Payer: Self-pay | Admitting: Cardiology

## 2013-02-23 NOTE — Telephone Encounter (Signed)
Raynelle Fanning called to inform office pt weight is 334.6lb Weight has been up x 8 days Order to take extra torsemide when weight is >329 however pt has only been taking extra for 2 days  Please call pt with further instructions

## 2013-02-23 NOTE — Telephone Encounter (Signed)
Spoke w/pt today is his day to take Metolazone, he states he has taken extra Torsemide a few times including today, he states wt is starting to come down, will call pt in AM to check on wt

## 2013-02-24 NOTE — Telephone Encounter (Signed)
Spoke w/pt today wt down to 330, also let Raynelle Fanning know, we will both try to call pt weekly to keep him on track, again reviewed salt and fluid restrictions and importance of taking meds as prescribed

## 2013-02-28 DIAGNOSIS — Z125 Encounter for screening for malignant neoplasm of prostate: Secondary | ICD-10-CM | POA: Diagnosis not present

## 2013-02-28 DIAGNOSIS — E119 Type 2 diabetes mellitus without complications: Secondary | ICD-10-CM | POA: Diagnosis not present

## 2013-02-28 DIAGNOSIS — I10 Essential (primary) hypertension: Secondary | ICD-10-CM | POA: Diagnosis not present

## 2013-02-28 DIAGNOSIS — E785 Hyperlipidemia, unspecified: Secondary | ICD-10-CM | POA: Diagnosis not present

## 2013-03-02 DIAGNOSIS — E1129 Type 2 diabetes mellitus with other diabetic kidney complication: Secondary | ICD-10-CM | POA: Diagnosis not present

## 2013-03-02 DIAGNOSIS — G4710 Hypersomnia, unspecified: Secondary | ICD-10-CM | POA: Diagnosis not present

## 2013-03-14 DIAGNOSIS — D539 Nutritional anemia, unspecified: Secondary | ICD-10-CM | POA: Diagnosis not present

## 2013-03-14 DIAGNOSIS — E876 Hypokalemia: Secondary | ICD-10-CM | POA: Diagnosis not present

## 2013-03-14 DIAGNOSIS — D649 Anemia, unspecified: Secondary | ICD-10-CM | POA: Diagnosis not present

## 2013-03-28 DIAGNOSIS — E1149 Type 2 diabetes mellitus with other diabetic neurological complication: Secondary | ICD-10-CM | POA: Diagnosis not present

## 2013-03-28 DIAGNOSIS — E119 Type 2 diabetes mellitus without complications: Secondary | ICD-10-CM | POA: Diagnosis not present

## 2013-03-30 ENCOUNTER — Ambulatory Visit (INDEPENDENT_AMBULATORY_CARE_PROVIDER_SITE_OTHER): Payer: Medicare Other | Admitting: Cardiology

## 2013-03-30 ENCOUNTER — Encounter: Payer: Self-pay | Admitting: Cardiology

## 2013-03-30 ENCOUNTER — Other Ambulatory Visit: Payer: Self-pay | Admitting: *Deleted

## 2013-03-30 VITALS — BP 122/67 | HR 58 | Ht 72.0 in | Wt 338.8 lb

## 2013-03-30 DIAGNOSIS — I2510 Atherosclerotic heart disease of native coronary artery without angina pectoris: Secondary | ICD-10-CM

## 2013-03-30 DIAGNOSIS — I5033 Acute on chronic diastolic (congestive) heart failure: Secondary | ICD-10-CM

## 2013-03-30 DIAGNOSIS — I471 Supraventricular tachycardia: Secondary | ICD-10-CM | POA: Diagnosis not present

## 2013-03-30 NOTE — Assessment & Plan Note (Signed)
No palpitations, stable on beta blocker.

## 2013-03-30 NOTE — Assessment & Plan Note (Signed)
Weight is up as outlined above. I asked him to increase Demadex to 80 mg daily for the next week, weight himself daily. Would like to see a decrease in 10 pounds back toward prior baseline. Can then cut back to 60 mg daily with when necessary use of additional dose. Keep follow up in CHF clinic this month, BMET at that time.

## 2013-03-30 NOTE — Assessment & Plan Note (Signed)
No active angina symptoms with history of nonobstructive disease. Continues on aspirin and statin.

## 2013-03-30 NOTE — Progress Notes (Signed)
Clinical Summary Mr. Lindaman is a 79 y.o.male last seen in October. Interval phone communication with the CHF clinic reviewed. He is here with his wife today. Weight is up nearly 12 pounds by our scales since October. He states that this is due to decreased activity, also increase fluid intake. We reviewed his diuretic regimen. He has used no more than 60 mg Demadex on daily basis over the last 3 weeks.  Lab work in September showed sodium 143, potassium 3.7, BUN 20, creatinine 1.4.  Today we discussed sodium restriction, 1500 cc 24 hour fluid restriction, also increasing his activity within reasonable functional limitations. He will be seeing the CHF clinic in the next few weeks.   Allergies  Allergen Reactions  . Ambien [Zolpidem Tartrate]     Makes him cuss like a sailor  . Lyrica [Pregabalin]     Causes him to have fluid  . Penicillins Hives    nightmares  . Piroxicam   . Sulfonamide Derivatives     Night sweats    Current Outpatient Prescriptions  Medication Sig Dispense Refill  . amLODipine (NORVASC) 10 MG tablet Take 1 tablet (10 mg total) by mouth daily.  30 tablet  3  . aspirin 81 MG chewable tablet Chew 2 tablets (162 mg total) by mouth daily.      . cetirizine (ZYRTEC) 10 MG tablet Take 10 mg by mouth every evening.       . Cholecalciferol (D-3-5) 5000 UNITS capsule Take 5,000 Units by mouth daily.        . fluticasone (FLONASE) 50 MCG/ACT nasal spray Place 2 sprays into the nose daily.       Marland Kitchen gabapentin (NEURONTIN) 300 MG capsule Take 1 capsule (300 mg total) by mouth 2 (two) times daily.  60 capsule  3  . HYDROcodone-acetaminophen (NORCO/VICODIN) 5-325 MG per tablet Take 1 tablet by mouth every 6 (six) hours as needed for pain.  30 tablet  0  . insulin glargine (LANTUS) 100 UNIT/ML injection Inject 28 Units into the skin 2 (two) times daily.       Marland Kitchen labetalol (NORMODYNE) 200 MG tablet Take 1 tablet (200 mg total) by mouth 2 (two) times daily.  60 tablet  1  .  latanoprost (XALATAN) 0.005 % ophthalmic solution Place 1 drop into the right eye at bedtime.       . lidocaine (LIDODERM) 5 % Place 1 patch onto the skin daily as needed. Remove & Discard patch within 12 hours or as directed by MD.  30 patch  0  . metolazone (ZAROXOLYN) 2.5 MG tablet Take 2.5 mg on Wednesdays and Sundays.  8 tablet  3  . montelukast (SINGULAIR) 10 MG tablet Take 10 mg by mouth at bedtime.        . Multiple Vitamins-Minerals (CENTRUM SILVER PO) Take 1 tablet by mouth daily.        . potassium chloride SA (K-DUR,KLOR-CON) 20 MEQ tablet Take 1 tab daily except Wed and Sun take 2 tabs daily  45 tablet  6  . simvastatin (ZOCOR) 40 MG tablet Take 40 mg by mouth at bedtime.        . Tamsulosin HCl (FLOMAX) 0.4 MG CAPS Take 0.4 mg by mouth at bedtime.       . torsemide (DEMADEX) 20 MG tablet Take 60 mg daily. If home weight greater than 329 lbs take an extra 20 mg.  100 tablet  6   No current facility-administered medications for this visit.  Past Medical History  Diagnosis Date  . PSVT (paroxysmal supraventricular tachycardia)     AVNRT  . Coronary atherosclerosis of native coronary artery     Nonobstructive, LVEF 65%  . Hyperlipidemia   . Morbid obesity   . Arthritis   . Type 2 diabetes mellitus   . Vertigo   . Venous insufficiency   . Sleep apnea, obstructive   . Chronic diastolic heart failure     Social History Mr. Dowdy reports that he has never smoked. He has never used smokeless tobacco. Mr. Hawe reports that he does not drink alcohol.  Review of Systems No palpitations or chest pain. No falls. Otherwise negative.  Physical Examination Filed Vitals:   03/30/13 1303  BP: 122/67  Pulse: 58   Filed Weights   03/30/13 1303  Weight: 338 lb 12.8 oz (153.679 kg)    Morbidly obese, comfortable at rest.  HEENT: Conjunctiva and lids normal, oropharynx clear.  Neck: Supple, increased girth, difficult to appreciate JVP, no carotid bruits, no thyromegaly.    Lungs: Clear to auscultation, nonlabored breathing at rest.  Cardiac: Regular rate and rhythm, no S3, 2/6 systolic murmur, no pericardial rub.  Abdomen: Soft, nontender, obesey, bowel sounds present.  Extremities: Chronic appearing edema, compression hose on.  Skin: Warm and dry.  Musculoskeletal: No kyphosis.  Neuropsychiatric: Alert and oriented x3, affect grossly appropriate.   Problem List and Plan   Acute on chronic diastolic heart failure Weight is up as outlined above. I asked him to increase Demadex to 80 mg daily for the next week, weight himself daily. Would like to see a decrease in 10 pounds back toward prior baseline. Can then cut back to 60 mg daily with when necessary use of additional dose. Keep follow up in CHF clinic this month, BMET at that time.  CAD, NATIVE VESSEL No active angina symptoms with history of nonobstructive disease. Continues on aspirin and statin.  SVT/ PSVT/ PAT No palpitations, stable on beta blocker.    Jonelle Sidle, M.D., F.A.C.C.

## 2013-03-30 NOTE — Patient Instructions (Signed)
Your physician recommends that you schedule a follow-up appointment in: 2 months. Your physician has recommended you make the following change in your medication: Increase your torsemide to taking 4 per day for the next week, then resume previous dose of 3 per day as directed/and as needed. All other medications will remain the same. Your physician recommends that you have lab work to check a BMET at your up coming visit on 04/13/13 with [facility].

## 2013-03-31 ENCOUNTER — Telehealth: Payer: Self-pay

## 2013-03-31 DIAGNOSIS — G894 Chronic pain syndrome: Secondary | ICD-10-CM

## 2013-03-31 NOTE — Telephone Encounter (Signed)
Gabapentin 300mg  take 1 capsule bid, refill request from [facility].  Please advise.

## 2013-04-01 MED ORDER — GABAPENTIN 300 MG PO CAPS: 300.0000 mg | ORAL_CAPSULE | Freq: Two times a day (BID) | ORAL | Status: DC

## 2013-04-01 NOTE — Telephone Encounter (Signed)
Please refill with 4 addnl RF

## 2013-04-01 NOTE — Telephone Encounter (Signed)
Gabapentin refills sent to [facility].

## 2013-04-04 ENCOUNTER — Encounter (HOSPITAL_COMMUNITY): Payer: Medicare Other

## 2013-04-13 ENCOUNTER — Ambulatory Visit (HOSPITAL_COMMUNITY): Payer: Medicare Other

## 2013-04-14 ENCOUNTER — Inpatient Hospital Stay (HOSPITAL_COMMUNITY)
Admission: EM | Admit: 2013-04-14 | Discharge: 2013-04-22 | DRG: 871 | Disposition: A | Discharge status: Skilled Nursing Facility | Payer: Medicare Other | Attending: Internal Medicine | Admitting: Internal Medicine

## 2013-04-14 ENCOUNTER — Encounter (HOSPITAL_COMMUNITY): Payer: Self-pay | Admitting: Emergency Medicine

## 2013-04-14 ENCOUNTER — Emergency Department (HOSPITAL_COMMUNITY): Payer: Medicare Other

## 2013-04-14 DIAGNOSIS — E1142 Type 2 diabetes mellitus with diabetic polyneuropathy: Secondary | ICD-10-CM | POA: Diagnosis present

## 2013-04-14 DIAGNOSIS — H7090 Unspecified mastoiditis, unspecified ear: Secondary | ICD-10-CM | POA: Diagnosis present

## 2013-04-14 DIAGNOSIS — R279 Unspecified lack of coordination: Secondary | ICD-10-CM | POA: Diagnosis not present

## 2013-04-14 DIAGNOSIS — M6281 Muscle weakness (generalized): Secondary | ICD-10-CM | POA: Diagnosis not present

## 2013-04-14 DIAGNOSIS — E538 Deficiency of other specified B group vitamins: Secondary | ICD-10-CM | POA: Diagnosis present

## 2013-04-14 DIAGNOSIS — R609 Edema, unspecified: Secondary | ICD-10-CM | POA: Diagnosis present

## 2013-04-14 DIAGNOSIS — D6959 Other secondary thrombocytopenia: Secondary | ICD-10-CM | POA: Diagnosis not present

## 2013-04-14 DIAGNOSIS — A403 Sepsis due to Streptococcus pneumoniae: Principal | ICD-10-CM | POA: Diagnosis present

## 2013-04-14 DIAGNOSIS — Z5189 Encounter for other specified aftercare: Secondary | ICD-10-CM | POA: Diagnosis not present

## 2013-04-14 DIAGNOSIS — Z794 Long term (current) use of insulin: Secondary | ICD-10-CM | POA: Diagnosis not present

## 2013-04-14 DIAGNOSIS — I10 Essential (primary) hypertension: Secondary | ICD-10-CM | POA: Diagnosis present

## 2013-04-14 DIAGNOSIS — N179 Acute kidney failure, unspecified: Secondary | ICD-10-CM

## 2013-04-14 DIAGNOSIS — E782 Mixed hyperlipidemia: Secondary | ICD-10-CM | POA: Diagnosis present

## 2013-04-14 DIAGNOSIS — M129 Arthropathy, unspecified: Secondary | ICD-10-CM | POA: Diagnosis present

## 2013-04-14 DIAGNOSIS — E785 Hyperlipidemia, unspecified: Secondary | ICD-10-CM | POA: Diagnosis present

## 2013-04-14 DIAGNOSIS — D638 Anemia in other chronic diseases classified elsewhere: Secondary | ICD-10-CM | POA: Diagnosis present

## 2013-04-14 DIAGNOSIS — H9190 Unspecified hearing loss, unspecified ear: Secondary | ICD-10-CM | POA: Diagnosis present

## 2013-04-14 DIAGNOSIS — M79609 Pain in unspecified limb: Secondary | ICD-10-CM | POA: Diagnosis not present

## 2013-04-14 DIAGNOSIS — IMO0001 Reserved for inherently not codable concepts without codable children: Secondary | ICD-10-CM | POA: Diagnosis present

## 2013-04-14 DIAGNOSIS — B953 Streptococcus pneumoniae as the cause of diseases classified elsewhere: Secondary | ICD-10-CM | POA: Diagnosis not present

## 2013-04-14 DIAGNOSIS — I5032 Chronic diastolic (congestive) heart failure: Secondary | ICD-10-CM

## 2013-04-14 DIAGNOSIS — B954 Other streptococcus as the cause of diseases classified elsewhere: Secondary | ICD-10-CM | POA: Diagnosis not present

## 2013-04-14 DIAGNOSIS — I2489 Other forms of acute ischemic heart disease: Secondary | ICD-10-CM | POA: Diagnosis present

## 2013-04-14 DIAGNOSIS — J329 Chronic sinusitis, unspecified: Secondary | ICD-10-CM | POA: Diagnosis not present

## 2013-04-14 DIAGNOSIS — R269 Unspecified abnormalities of gait and mobility: Secondary | ICD-10-CM | POA: Diagnosis not present

## 2013-04-14 DIAGNOSIS — J9819 Other pulmonary collapse: Secondary | ICD-10-CM | POA: Diagnosis not present

## 2013-04-14 DIAGNOSIS — Z9119 Patient's noncompliance with other medical treatment and regimen: Secondary | ICD-10-CM

## 2013-04-14 DIAGNOSIS — H5440 Blindness, one eye, unspecified eye: Secondary | ICD-10-CM | POA: Diagnosis not present

## 2013-04-14 DIAGNOSIS — R5381 Other malaise: Secondary | ICD-10-CM | POA: Diagnosis present

## 2013-04-14 DIAGNOSIS — I5033 Acute on chronic diastolic (congestive) heart failure: Secondary | ICD-10-CM | POA: Diagnosis not present

## 2013-04-14 DIAGNOSIS — N390 Urinary tract infection, site not specified: Secondary | ICD-10-CM

## 2013-04-14 DIAGNOSIS — H6691 Otitis media, unspecified, right ear: Secondary | ICD-10-CM

## 2013-04-14 DIAGNOSIS — D696 Thrombocytopenia, unspecified: Secondary | ICD-10-CM | POA: Diagnosis present

## 2013-04-14 DIAGNOSIS — E1149 Type 2 diabetes mellitus with other diabetic neurological complication: Secondary | ICD-10-CM | POA: Diagnosis present

## 2013-04-14 DIAGNOSIS — G4733 Obstructive sleep apnea (adult) (pediatric): Secondary | ICD-10-CM | POA: Diagnosis present

## 2013-04-14 DIAGNOSIS — Z6841 Body Mass Index (BMI) 40.0 and over, adult: Secondary | ICD-10-CM

## 2013-04-14 DIAGNOSIS — R7989 Other specified abnormal findings of blood chemistry: Secondary | ICD-10-CM

## 2013-04-14 DIAGNOSIS — R42 Dizziness and giddiness: Secondary | ICD-10-CM | POA: Diagnosis present

## 2013-04-14 DIAGNOSIS — G4730 Sleep apnea, unspecified: Secondary | ICD-10-CM | POA: Diagnosis not present

## 2013-04-14 DIAGNOSIS — I369 Nonrheumatic tricuspid valve disorder, unspecified: Secondary | ICD-10-CM | POA: Diagnosis not present

## 2013-04-14 DIAGNOSIS — G894 Chronic pain syndrome: Secondary | ICD-10-CM | POA: Diagnosis present

## 2013-04-14 DIAGNOSIS — R51 Headache: Secondary | ICD-10-CM | POA: Diagnosis present

## 2013-04-14 DIAGNOSIS — E876 Hypokalemia: Secondary | ICD-10-CM

## 2013-04-14 DIAGNOSIS — I471 Supraventricular tachycardia, unspecified: Secondary | ICD-10-CM | POA: Diagnosis present

## 2013-04-14 DIAGNOSIS — I129 Hypertensive chronic kidney disease with stage 1 through stage 4 chronic kidney disease, or unspecified chronic kidney disease: Secondary | ICD-10-CM | POA: Diagnosis not present

## 2013-04-14 DIAGNOSIS — I248 Other forms of acute ischemic heart disease: Secondary | ICD-10-CM | POA: Diagnosis present

## 2013-04-14 DIAGNOSIS — N189 Chronic kidney disease, unspecified: Secondary | ICD-10-CM

## 2013-04-14 DIAGNOSIS — R7881 Bacteremia: Secondary | ICD-10-CM | POA: Diagnosis not present

## 2013-04-14 DIAGNOSIS — H6690 Otitis media, unspecified, unspecified ear: Secondary | ICD-10-CM | POA: Diagnosis present

## 2013-04-14 DIAGNOSIS — N183 Chronic kidney disease, stage 3 unspecified: Secondary | ICD-10-CM | POA: Diagnosis present

## 2013-04-14 DIAGNOSIS — A419 Sepsis, unspecified organism: Secondary | ICD-10-CM

## 2013-04-14 DIAGNOSIS — R6510 Systemic inflammatory response syndrome (SIRS) of non-infectious origin without acute organ dysfunction: Secondary | ICD-10-CM | POA: Diagnosis not present

## 2013-04-14 DIAGNOSIS — T502X5A Adverse effect of carbonic-anhydrase inhibitors, benzothiadiazides and other diuretics, initial encounter: Secondary | ICD-10-CM | POA: Diagnosis present

## 2013-04-14 DIAGNOSIS — I5030 Unspecified diastolic (congestive) heart failure: Secondary | ICD-10-CM | POA: Diagnosis present

## 2013-04-14 DIAGNOSIS — E871 Hypo-osmolality and hyponatremia: Secondary | ICD-10-CM | POA: Diagnosis present

## 2013-04-14 DIAGNOSIS — E119 Type 2 diabetes mellitus without complications: Secondary | ICD-10-CM | POA: Diagnosis not present

## 2013-04-14 DIAGNOSIS — Z7982 Long term (current) use of aspirin: Secondary | ICD-10-CM

## 2013-04-14 DIAGNOSIS — I509 Heart failure, unspecified: Secondary | ICD-10-CM | POA: Diagnosis present

## 2013-04-14 DIAGNOSIS — R1110 Vomiting, unspecified: Secondary | ICD-10-CM | POA: Diagnosis present

## 2013-04-14 DIAGNOSIS — B955 Unspecified streptococcus as the cause of diseases classified elsewhere: Secondary | ICD-10-CM

## 2013-04-14 DIAGNOSIS — R293 Abnormal posture: Secondary | ICD-10-CM | POA: Diagnosis not present

## 2013-04-14 DIAGNOSIS — I872 Venous insufficiency (chronic) (peripheral): Secondary | ICD-10-CM | POA: Diagnosis not present

## 2013-04-14 DIAGNOSIS — I2510 Atherosclerotic heart disease of native coronary artery without angina pectoris: Secondary | ICD-10-CM | POA: Diagnosis present

## 2013-04-14 DIAGNOSIS — Z91199 Patient's noncompliance with other medical treatment and regimen due to unspecified reason: Secondary | ICD-10-CM

## 2013-04-14 DIAGNOSIS — R4182 Altered mental status, unspecified: Secondary | ICD-10-CM | POA: Diagnosis not present

## 2013-04-14 LAB — COMPREHENSIVE METABOLIC PANEL
ALT: 11 U/L (ref 0–53)
Alkaline Phosphatase: 58 U/L (ref 39–117)
BUN: 30 mg/dL — ABNORMAL HIGH (ref 6–23)
CO2: 26 mEq/L (ref 19–32)
GFR calc Af Amer: 46 mL/min — ABNORMAL LOW (ref >90)
GFR calc non Af Amer: 39 mL/min — ABNORMAL LOW (ref >90)
Glucose, Bld: 167 mg/dL — ABNORMAL HIGH (ref 70–99)
Potassium: 2.7 mEq/L — CL (ref 3.5–5.1)
Sodium: 144 mEq/L (ref 135–145)
Total Bilirubin: 1.1 mg/dL (ref 0.3–1.2)

## 2013-04-14 LAB — CBC WITH DIFFERENTIAL/PLATELET
Basophils Relative: 0 % (ref 0–1)
HCT: 29.3 % — ABNORMAL LOW (ref 39.0–52.0)
Hemoglobin: 10.5 g/dL — ABNORMAL LOW (ref 13.0–17.0)
Lymphocytes Relative: 11 % — ABNORMAL LOW (ref 12–46)
Lymphs Abs: 0.8 10*3/uL (ref 0.7–4.0)
MCHC: 35.8 g/dL (ref 30.0–36.0)
MCV: 92.7 fL (ref 78.0–100.0)
Monocytes Relative: 11 % (ref 3–12)
Neutro Abs: 5.2 10*3/uL (ref 1.7–7.7)
WBC: 6.9 10*3/uL (ref 4.0–10.5)

## 2013-04-14 LAB — URINE MICROSCOPIC-ADD ON

## 2013-04-14 LAB — CG4 I-STAT (LACTIC ACID): Lactic Acid, Venous: 1.83 mmol/L (ref 0.5–2.2)

## 2013-04-14 LAB — URINALYSIS, ROUTINE W REFLEX MICROSCOPIC
Glucose, UA: NEGATIVE mg/dL
Ketones, ur: NEGATIVE mg/dL
Protein, ur: 100 mg/dL — AB

## 2013-04-14 LAB — POCT I-STAT TROPONIN I

## 2013-04-14 MED ORDER — ACETAMINOPHEN 325 MG PO TABS
650.0000 mg | ORAL_TABLET | Freq: Four times a day (QID) | ORAL | Status: DC | PRN
  Administered 2013-04-14: 650 mg via ORAL
  Filled 2013-04-14 (×2): qty 2

## 2013-04-14 MED ORDER — METOCLOPRAMIDE HCL 5 MG/ML IJ SOLN
10.0000 mg | Freq: Once | INTRAMUSCULAR | Status: AC
  Administered 2013-04-14: 10 mg via INTRAVENOUS
  Filled 2013-04-14: qty 2

## 2013-04-14 MED ORDER — DEXTROSE 5 % IV SOLN
1.0000 g | Freq: Once | INTRAVENOUS | Status: AC
  Administered 2013-04-14: 1 g via INTRAVENOUS
  Filled 2013-04-14: qty 10

## 2013-04-14 MED ORDER — POTASSIUM CHLORIDE CRYS ER 20 MEQ PO TBCR
40.0000 meq | EXTENDED_RELEASE_TABLET | Freq: Once | ORAL | Status: AC
  Administered 2013-04-14: 40 meq via ORAL
  Filled 2013-04-14: qty 2

## 2013-04-14 MED ORDER — ASPIRIN 325 MG PO TABS
325.0000 mg | ORAL_TABLET | Freq: Once | ORAL | Status: AC
  Administered 2013-04-14: 325 mg via ORAL
  Filled 2013-04-14: qty 1

## 2013-04-14 MED ORDER — IBUPROFEN 800 MG PO TABS
800.0000 mg | ORAL_TABLET | Freq: Once | ORAL | Status: AC
  Administered 2013-04-14: 800 mg via ORAL
  Filled 2013-04-14: qty 1

## 2013-04-14 MED ORDER — ONDANSETRON 8 MG PO TBDP
8.0000 mg | ORAL_TABLET | Freq: Once | ORAL | Status: AC
  Administered 2013-04-14: 8 mg via ORAL
  Filled 2013-04-14: qty 1

## 2013-04-14 MED ORDER — POTASSIUM CHLORIDE 10 MEQ/100ML IV SOLN
10.0000 meq | INTRAVENOUS | Status: AC
  Administered 2013-04-14 (×2): 10 meq via INTRAVENOUS
  Filled 2013-04-14 (×3): qty 100

## 2013-04-14 NOTE — ED Notes (Signed)
EDP Yao at bedside. 

## 2013-04-14 NOTE — ED Notes (Signed)
Pt complains of loss of hearing L ear for last week. Also began having belching gas since arrival. Pt also has chills, fever upon arrival

## 2013-04-14 NOTE — ED Notes (Signed)
Potassium of 2.7 relayed from [place] in lab. Dr. Silverio Lay notified.
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
## 2013-04-14 NOTE — ED Notes (Signed)
[place], EDP given i-stat trop results.

## 2013-04-15 ENCOUNTER — Encounter (HOSPITAL_COMMUNITY): Payer: Self-pay

## 2013-04-15 DIAGNOSIS — I5030 Unspecified diastolic (congestive) heart failure: Secondary | ICD-10-CM | POA: Diagnosis present

## 2013-04-15 DIAGNOSIS — I369 Nonrheumatic tricuspid valve disorder, unspecified: Secondary | ICD-10-CM

## 2013-04-15 DIAGNOSIS — H5440 Blindness, one eye, unspecified eye: Secondary | ICD-10-CM | POA: Diagnosis present

## 2013-04-15 DIAGNOSIS — E876 Hypokalemia: Secondary | ICD-10-CM | POA: Diagnosis present

## 2013-04-15 LAB — COMPREHENSIVE METABOLIC PANEL
ALT: 10 U/L (ref 0–53)
AST: 19 U/L (ref 0–37)
Albumin: 3.2 g/dL — ABNORMAL LOW (ref 3.5–5.2)
CO2: 26 mEq/L (ref 19–32)
Calcium: 8.9 mg/dL (ref 8.4–10.5)
Chloride: 102 mEq/L (ref 96–112)
Creatinine, Ser: 1.8 mg/dL — ABNORMAL HIGH (ref 0.50–1.35)
Glucose, Bld: 163 mg/dL — ABNORMAL HIGH (ref 70–99)
Potassium: 2.7 mEq/L — CL (ref 3.5–5.1)
Sodium: 140 mEq/L (ref 135–145)
Total Bilirubin: 1.2 mg/dL (ref 0.3–1.2)

## 2013-04-15 LAB — TROPONIN I
Troponin I: <0.3 ng/mL (ref <0.30)
Troponin I: <0.3 ng/mL (ref <0.30)

## 2013-04-15 LAB — GLUCOSE, CAPILLARY
Glucose-Capillary: 124 mg/dL — ABNORMAL HIGH (ref 70–99)
Glucose-Capillary: 132 mg/dL — ABNORMAL HIGH (ref 70–99)
Glucose-Capillary: 169 mg/dL — ABNORMAL HIGH (ref 70–99)

## 2013-04-15 LAB — MAGNESIUM
Magnesium: 1.9 mg/dL (ref 1.5–2.5)
Magnesium: 2 mg/dL (ref 1.5–2.5)

## 2013-04-15 LAB — BASIC METABOLIC PANEL
BUN: 35 mg/dL — ABNORMAL HIGH (ref 6–23)
Calcium: 8.9 mg/dL (ref 8.4–10.5)
Chloride: 102 mEq/L (ref 96–112)
Creatinine, Ser: 1.97 mg/dL — ABNORMAL HIGH (ref 0.50–1.35)
GFR calc non Af Amer: 31 mL/min — ABNORMAL LOW (ref >90)
Glucose, Bld: 145 mg/dL — ABNORMAL HIGH (ref 70–99)

## 2013-04-15 LAB — TSH: TSH: 0.912 u[IU]/mL (ref 0.350–4.500)

## 2013-04-15 LAB — LIPID PANEL
Cholesterol: 106 mg/dL (ref 0–200)
HDL: 36 mg/dL — ABNORMAL LOW (ref >39)

## 2013-04-15 MED ORDER — INSULIN ASPART 100 UNIT/ML ~~LOC~~ SOLN
0.0000 [IU] | SUBCUTANEOUS | Status: DC
  Administered 2013-04-15: 17:00:00 4 [IU] via SUBCUTANEOUS
  Administered 2013-04-15: 09:00:00 3 [IU] via SUBCUTANEOUS
  Administered 2013-04-15: 22:00:00 4 [IU] via SUBCUTANEOUS
  Administered 2013-04-15: 7 [IU] via SUBCUTANEOUS
  Administered 2013-04-16 (×2): 4 [IU] via SUBCUTANEOUS
  Administered 2013-04-16: 05:00:00 3 [IU] via SUBCUTANEOUS
  Administered 2013-04-16 – 2013-04-17 (×7): 4 [IU] via SUBCUTANEOUS

## 2013-04-15 MED ORDER — SIMVASTATIN 40 MG PO TABS: 40.0000 mg | ORAL_TABLET | Freq: Every day | ORAL | Status: DC

## 2013-04-15 MED ORDER — ENOXAPARIN SODIUM 80 MG/0.8ML ~~LOC~~ SOLN
75.0000 mg | SUBCUTANEOUS | Status: DC
  Administered 2013-04-16 – 2013-04-22 (×7): 75 mg via SUBCUTANEOUS
  Filled 2013-04-15 (×8): qty 0.8

## 2013-04-15 MED ORDER — CIPROFLOXACIN-DEXAMETHASONE 0.3-0.1 % OT SUSP
4.0000 [drp] | Freq: Two times a day (BID) | OTIC | Status: DC
  Administered 2013-04-15 – 2013-04-18 (×7): 4 [drp] via OTIC
  Filled 2013-04-15: qty 7.5

## 2013-04-15 MED ORDER — SALINE SPRAY 0.65 % NA SOLN
1.0000 | NASAL | Status: DC | PRN
  Administered 2013-04-15 – 2013-04-22 (×4): 1 via NASAL
  Filled 2013-04-15: qty 44

## 2013-04-15 MED ORDER — METOLAZONE 5 MG PO TABS
5.0000 mg | ORAL_TABLET | Freq: Two times a day (BID) | ORAL | Status: DC
  Administered 2013-04-15: 5 mg via ORAL
  Filled 2013-04-15 (×3): qty 1

## 2013-04-15 MED ORDER — AMLODIPINE BESYLATE 10 MG PO TABS
10.0000 mg | ORAL_TABLET | Freq: Every day | ORAL | Status: DC
  Administered 2013-04-15 – 2013-04-22 (×8): 10 mg via ORAL
  Filled 2013-04-15 (×8): qty 1

## 2013-04-15 MED ORDER — FLUTICASONE PROPIONATE 50 MCG/ACT NA SUSP
2.0000 | Freq: Every day | NASAL | Status: DC
  Administered 2013-04-15 – 2013-04-22 (×8): 2 via NASAL
  Filled 2013-04-15: qty 16

## 2013-04-15 MED ORDER — LEVOFLOXACIN IN D5W 750 MG/150ML IV SOLN: 750.0000 mg | INTRAVENOUS | Status: DC

## 2013-04-15 MED ORDER — HYDROCODONE-ACETAMINOPHEN 5-325 MG PO TABS: 1.0000 | ORAL_TABLET | Freq: Four times a day (QID) | ORAL | Status: DC | PRN

## 2013-04-15 MED ORDER — SODIUM CHLORIDE 0.9 % IJ SOLN: 3.0000 mL | INTRAMUSCULAR | Status: DC | PRN

## 2013-04-15 MED ORDER — POTASSIUM CHLORIDE 10 MEQ/100ML IV SOLN
10.0000 meq | INTRAVENOUS | Status: AC
  Administered 2013-04-15 (×4): 10 meq via INTRAVENOUS
  Filled 2013-04-15 (×4): qty 100

## 2013-04-15 MED ORDER — ATORVASTATIN CALCIUM 20 MG PO TABS
20.0000 mg | ORAL_TABLET | Freq: Every day | ORAL | Status: DC
  Administered 2013-04-15 – 2013-04-21 (×8): 20 mg via ORAL
  Filled 2013-04-15 (×9): qty 1

## 2013-04-15 MED ORDER — VANCOMYCIN HCL 10 G IV SOLR
2000.0000 mg | Freq: Once | INTRAVENOUS | Status: AC
  Administered 2013-04-15: 17:00:00 2000 mg via INTRAVENOUS
  Filled 2013-04-15: qty 2000

## 2013-04-15 MED ORDER — LIDOCAINE 5 % EX PTCH
1.0000 | MEDICATED_PATCH | Freq: Every day | CUTANEOUS | Status: DC | PRN
  Administered 2013-04-15 – 2013-04-20 (×4): 1 via TRANSDERMAL
  Filled 2013-04-15 (×4): qty 1

## 2013-04-15 MED ORDER — LEVOFLOXACIN IN D5W 750 MG/150ML IV SOLN
750.0000 mg | INTRAVENOUS | Status: DC
Start: 2013-04-15 — End: 2013-04-15
  Administered 2013-04-15: 10:00:00 750 mg via INTRAVENOUS
  Filled 2013-04-15: qty 150

## 2013-04-15 MED ORDER — CIPROFLOXACIN IN D5W 400 MG/200ML IV SOLN
400.0000 mg | Freq: Two times a day (BID) | INTRAVENOUS | Status: DC
  Administered 2013-04-15: 01:00:00 400 mg via INTRAVENOUS
  Filled 2013-04-15 (×2): qty 200

## 2013-04-15 MED ORDER — CLINDAMYCIN HCL 150 MG PO CAPS
150.0000 mg | ORAL_CAPSULE | Freq: Three times a day (TID) | ORAL | Status: DC
  Administered 2013-04-15: 150 mg via ORAL
  Filled 2013-04-15 (×3): qty 1

## 2013-04-15 MED ORDER — OXYMETAZOLINE HCL 0.05 % NA SOLN
1.0000 | Freq: Two times a day (BID) | NASAL | Status: AC
  Administered 2013-04-15 – 2013-04-17 (×6): 1 via NASAL
  Filled 2013-04-15: qty 15

## 2013-04-15 MED ORDER — LABETALOL HCL 200 MG PO TABS
200.0000 mg | ORAL_TABLET | Freq: Two times a day (BID) | ORAL | Status: DC
  Administered 2013-04-15 – 2013-04-22 (×16): 200 mg via ORAL
  Filled 2013-04-15 (×17): qty 1

## 2013-04-15 MED ORDER — SACCHAROMYCES BOULARDII 250 MG PO CAPS
250.0000 mg | ORAL_CAPSULE | Freq: Two times a day (BID) | ORAL | Status: DC
  Administered 2013-04-15 – 2013-04-22 (×15): 250 mg via ORAL
  Filled 2013-04-15 (×16): qty 1

## 2013-04-15 MED ORDER — TAMSULOSIN HCL 0.4 MG PO CAPS
0.4000 mg | ORAL_CAPSULE | Freq: Every day | ORAL | Status: DC
  Administered 2013-04-15 – 2013-04-21 (×8): 0.4 mg via ORAL
  Filled 2013-04-15 (×11): qty 1

## 2013-04-15 MED ORDER — ONDANSETRON HCL 4 MG/2ML IJ SOLN: 4.0000 mg | Freq: Four times a day (QID) | INTRAMUSCULAR | Status: DC | PRN

## 2013-04-15 MED ORDER — ENOXAPARIN SODIUM 40 MG/0.4ML ~~LOC~~ SOLN
40.0000 mg | SUBCUTANEOUS | Status: DC
  Administered 2013-04-15: 10:00:00 40 mg via SUBCUTANEOUS
  Filled 2013-04-15: qty 0.4

## 2013-04-15 MED ORDER — VITAMIN D 1000 UNITS PO TABS
5000.0000 [IU] | ORAL_TABLET | Freq: Every day | ORAL | Status: DC
  Administered 2013-04-15 – 2013-04-22 (×8): 5000 [IU] via ORAL
  Filled 2013-04-15 (×8): qty 5

## 2013-04-15 MED ORDER — ACETAMINOPHEN 325 MG PO TABS: 650.0000 mg | ORAL_TABLET | ORAL | Status: DC | PRN

## 2013-04-15 MED ORDER — MAGNESIUM SULFATE 40 MG/ML IJ SOLN
2.0000 g | Freq: Once | INTRAMUSCULAR | Status: AC
  Administered 2013-04-15: 2 g via INTRAVENOUS
  Filled 2013-04-15: qty 50

## 2013-04-15 MED ORDER — SODIUM CHLORIDE 0.9 % IJ SOLN
3.0000 mL | Freq: Two times a day (BID) | INTRAMUSCULAR | Status: DC
  Administered 2013-04-15 – 2013-04-22 (×8): 3 mL via INTRAVENOUS

## 2013-04-15 MED ORDER — OFLOXACIN 0.3 % OT SOLN: 5.0000 [drp] | Freq: Every day | OTIC | Status: DC

## 2013-04-15 MED ORDER — HYDROCODONE-ACETAMINOPHEN 5-325 MG PO TABS
1.0000 | ORAL_TABLET | Freq: Four times a day (QID) | ORAL | Status: DC | PRN
  Administered 2013-04-15: 1 via ORAL
  Administered 2013-04-16 – 2013-04-17 (×4): 2 via ORAL
  Filled 2013-04-15 (×5): qty 2

## 2013-04-15 MED ORDER — ASPIRIN EC 81 MG PO TBEC
81.0000 mg | DELAYED_RELEASE_TABLET | Freq: Every day | ORAL | Status: DC
  Administered 2013-04-15 – 2013-04-22 (×8): 81 mg via ORAL
  Filled 2013-04-15 (×8): qty 1

## 2013-04-15 MED ORDER — MONTELUKAST SODIUM 10 MG PO TABS
10.0000 mg | ORAL_TABLET | Freq: Every day | ORAL | Status: DC
  Administered 2013-04-15 – 2013-04-21 (×8): 10 mg via ORAL
  Filled 2013-04-15 (×9): qty 1

## 2013-04-15 MED ORDER — POTASSIUM CHLORIDE 10 MEQ/100ML IV SOLN
10.0000 meq | INTRAVENOUS | Status: AC
  Administered 2013-04-15 (×3): 10 meq via INTRAVENOUS
  Filled 2013-04-15 (×3): qty 100

## 2013-04-15 MED ORDER — LORATADINE 10 MG PO TABS
10.0000 mg | ORAL_TABLET | Freq: Every day | ORAL | Status: DC
  Administered 2013-04-15 – 2013-04-22 (×8): 10 mg via ORAL
  Filled 2013-04-15 (×8): qty 1

## 2013-04-15 MED ORDER — POTASSIUM CHLORIDE CRYS ER 20 MEQ PO TBCR
40.0000 meq | EXTENDED_RELEASE_TABLET | Freq: Once | ORAL | Status: AC
  Administered 2013-04-15: 19:00:00 40 meq via ORAL
  Filled 2013-04-15 (×2): qty 2

## 2013-04-15 MED ORDER — SODIUM CHLORIDE 0.9 % IV SOLN: 250.0000 mL | INTRAVENOUS | Status: DC | PRN

## 2013-04-15 MED ORDER — GABAPENTIN 300 MG PO CAPS
300.0000 mg | ORAL_CAPSULE | Freq: Two times a day (BID) | ORAL | Status: DC
  Administered 2013-04-15 – 2013-04-22 (×16): 300 mg via ORAL
  Filled 2013-04-15 (×17): qty 1

## 2013-04-15 MED ORDER — ACETAMINOPHEN 325 MG PO TABS: 650.0000 mg | ORAL_TABLET | Freq: Four times a day (QID) | ORAL | Status: DC | PRN

## 2013-04-15 MED ORDER — SODIUM CHLORIDE 0.9 % IV SOLN
INTRAVENOUS | Status: DC
  Administered 2013-04-15: 01:00:00 50 mL/h via INTRAVENOUS

## 2013-04-15 MED ORDER — POTASSIUM CHLORIDE CRYS ER 20 MEQ PO TBCR: 40.0000 meq | EXTENDED_RELEASE_TABLET | Freq: Once | ORAL | Status: DC

## 2013-04-15 MED ORDER — LATANOPROST 0.005 % OP SOLN
1.0000 [drp] | Freq: Every day | OPHTHALMIC | Status: DC
  Administered 2013-04-15 – 2013-04-21 (×8): 1 [drp] via OPHTHALMIC
  Filled 2013-04-15: qty 2.5

## 2013-04-15 MED ORDER — POTASSIUM CHLORIDE CRYS ER 20 MEQ PO TBCR
40.0000 meq | EXTENDED_RELEASE_TABLET | Freq: Once | ORAL | Status: AC
  Administered 2013-04-15: 11:00:00 40 meq via ORAL
  Filled 2013-04-15: qty 2

## 2013-04-15 MED ORDER — FUROSEMIDE 10 MG/ML IJ SOLN
60.0000 mg | Freq: Two times a day (BID) | INTRAMUSCULAR | Status: DC
  Administered 2013-04-15 – 2013-04-16 (×3): 60 mg via INTRAVENOUS
  Filled 2013-04-15 (×6): qty 6

## 2013-04-15 MED ORDER — SODIUM CHLORIDE 0.9 % IV SOLN
2000.0000 mg | INTRAVENOUS | Status: DC
  Filled 2013-04-15: qty 2000

## 2013-04-15 NOTE — Progress Notes (Signed)
  Echocardiogram 2D Echocardiogram has been performed.  Nestor Ramp M 04/15/2013, 3:51 PM

## 2013-04-15 NOTE — Progress Notes (Addendum)
[facility] PROGRESS NOTE  [name]:[id number] DOB: Apr 20, 1932 DOA: 04/14/2013 PCP: Catalina Pizza, MD  Assessment/Plan  Bilateral otitis media and possible mastoiditis, draining pus and blood out of the right ear since overnight -  Change ciprofloxacin to levofloxacin -  Add ciprodex ear drops for right ear -  Spoke with Dr. Lazarus Salines from ENT regarding bilateral ear pain, draining pus, and results of CT scan, appreciate assistance -  Dr. Lazarus Salines recommended 1 month of low dose clindamycin 150mg  TID and 2 weeks of FQ then f/u with ENT in 6 weeks.    GPC bacteremia, likely strep as pairs and chains -  Start vancomycin -  ECHO -  May need infectious disease assistance -  F/u blood cultures from 12/19 x 2 -  F/u speciation and sensitivities from 12/18 -  Blood cx 12/20   Dizziness may be related to otitis media/sinusitis.  DDx includes stroke, venous thrombosis -  Consider MRI if not improving with abx -  PT evaluation  Sinusitis with purulence draining from right nare -  Continue flonase -  Start afrin and nasal saline   Acute on chronic diastolic heart failure with wheezing on exam and 8 pound weight gain (dry weight near 329lbs).  Patient has asymmetric edema L>R at baseline although both legs are more swollen recently -  Continue IV lasix and given one dose of metolazone this morning -  D/c IVF -  Daily weights, strict I/O -  ECHO -  Trend electrolytes -  troponins neg  HTN/HLD, stable.  Continue norvasc, atorvastatin,   T2DM with peripheral neuropathy -  Continue gabapentin -  Trend CBG -  Continue high dose SSI   CKD stage 3, baseline creatinine 1.8 -  Minimize nephrotoxins, renally dose medications including gabapentin  Possible UTI -  Continue abx  -  F/u urine culture -  F/u blood cultures  Hypokalemia, likely secondary to diuretic use -  Replete with oral and IV potassium -  Keep magnesium near 2 (give 2gm IV once now) -  Anticipate need for  more potassium given ongoing diuresis  Hearing impairment, may be worsened by OM.     Normocytic anemia -  Iron panel, B12, folate -  Defer TSH due to acute infection  Thrombocytopenia, chronic.  Large platelets on smear -  Check for vitamin def  Diet:  Healthy heart, 2gm, 1.2L fluid restriction Access:  PIV IVF:  OFF Proph:  lovenox  Code Status: full Family Communication: patient and his grandson Disposition Plan: pending improvement in dizziness, PT eval, further recommendations from ENT   Consultants:  ENT by phone  Procedures:  CT head  Antibiotics:  Ciprofloxacin x 1  Levofloxacin 12/19 >>   HPI/Subjective:  Right ear started draining overnight pus and blood.  Has had bilateral ear pain, sinus pressure, [place]  Hearing impaired, runny nose.   Febrile overnight.    Objective: Filed Vitals:   04/14/13 2312 04/15/13 0010 04/15/13 0207 04/15/13 0435  BP:  135/52  128/65  Pulse:  78 66 75  Temp: 100.4 F (38 C) 100.7 F (38.2 C)  98.2 F (36.8 C)  TempSrc: Oral Oral  Oral  Resp:  22 18 16   Height:  6' (1.829 m)    Weight:  152.9 kg (337 lb 1.3 oz)    SpO2:  94% 93% 95%   No intake or output data in the 24 hours ending 04/15/13 1004 Filed  Weights   04/15/13 0010  Weight: 152.9 kg (337 lb 1.3 oz)    Exam:   General:  [name], No acute distress  HEENT:  NCAT, MMM, Right TM mildly erythematous, draining pus and blood, canal edematous anterior-inferiorly.  Left TM clouded, tympanostomy tube angulated in such a way that it is difficult to discern if it is in fact still in TM.  No drainage present.  Boggy turbinates with purulence draining from right nare  Cardiovascular:  RRR, nl S1, S2 no mrg, 2+ pulses, warm extremities  Respiratory:  Diminished bilateral BS, no obvious wheeze, rales, rhonchi, no increased WOB  Abdomen:   NABS, soft, NT/ND  MSK:   Normal tone and bulk, 3+ LLE and 2+ RLE  edema  Neuro:  Grossly intact  Data Reviewed: Basic Metabolic Panel:  Recent Labs Lab 04/14/13 2050 04/15/13 0215 04/15/13 0802  NA 144 140 141  K 2.7* 2.7* 2.8*  CL 103 102 102  CO2 26 26 26   GLUCOSE 167* 163* 145*  BUN 30* 32* 35*  CREATININE 1.60* 1.80* 1.97*  CALCIUM 9.5 8.9 8.9  MG  --  1.9  2.0  --    Liver Function Tests:  Recent Labs Lab 04/14/13 2050 04/15/13 0215  AST 23 19  ALT 11 10  ALKPHOS 58 48  BILITOT 1.1 1.2  PROT 6.4 5.7*  ALBUMIN 3.7 3.2*    Recent Labs Lab 04/14/13 2050  LIPASE 40   No results found for this basename: AMMONIA,  in the last 168 hours CBC:  Recent Labs Lab 04/14/13 2050  WBC 6.9  NEUTROABS 5.2  HGB 10.5*  HCT 29.3*  MCV 92.7  PLT 86*   Cardiac Enzymes:  Recent Labs Lab 04/15/13 0215 04/15/13 0802  TROPONINI <0.30 <0.30   BNP (last 3 results)  Recent Labs  04/14/13 2050  PROBNP 1021.0*   CBG:  Recent Labs Lab 04/15/13 0144 04/15/13 0435 04/15/13 0754  GLUCAP 145* 124* 132*    No results found for this or any previous visit (from the past 240 hour(s)).   Studies: Dg Chest 2 View  04/14/2013   CLINICAL DATA:  Fever, emesis, history diabetes  EXAM: CHEST  2 VIEW  COMPARISON:  06/24/2012  FINDINGS: Enlargement of cardiac silhouette with pulmonary vascular congestion.  Atherosclerotic calcification aorta.  Minimal bibasilar atelectasis.  No acute failure or consolidation.  No pleural effusion or pneumothorax.  Scattered endplate spur formation thoracic spine.  IMPRESSION: Enlargement of cardiac silhouette with pulmonary vascular congestion.  Bibasilar atelectasis.   Electronically Signed   By: Ulyses Southward M.D.   On: 04/14/2013 21:32   Ct Head Wo Contrast  04/14/2013   CLINICAL DATA:  Dizziness  EXAM: CT HEAD WITHOUT CONTRAST  TECHNIQUE: Contiguous axial images were obtained from the base of the skull through the vertex without intravenous contrast.  COMPARISON:  06/24/2006  FINDINGS: Skull and  Sinuses:Bilateral mastoid and middle ear opacification, present on the right in 2008. Patchy mucosal thickening in the bilateral paranasal sinuses, most severe in the left maxillary antrum, right anterior ethmoids, and right frontal sinus. Some of the contents is high-density, compatible with inspissated secretions or fungal colonization. Linear foreign body in the left maxillary antrum is likely a displaced plate for treatment of remote blowout fracture of the left orbital floor - also seen in 2008. There is also a remote blowout fracture of the medial wall left orbit.  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 Right proptosis, without retro-orbital mass or inflammation.  Brain: No evidence of acute abnormality, such as acute infarction, hemorrhage, hydrocephalus, or mass lesion/mass effect. There is atrophy and mild chronic small vessel ischemic white matter changes.  IMPRESSION: 1. No evidence of acute intracranial disease. 2. Bilateral mastoid and middle ear opacification. Consider mastoiditis as a cause of the patient's dizziness. 3. Multifocal inflammatory paranasal sinus disease. 4. Remote trauma to the left orbit, discussed above.   Electronically Signed   By: Tiburcio Pea M.D.   On: 04/14/2013 21:51    Scheduled Meds: . amLODipine  10 mg Oral Daily  . aspirin EC  81 mg Oral Daily  . atorvastatin  20 mg Oral QHS  . cholecalciferol  5,000 Units Oral Daily  . enoxaparin (LOVENOX) injection  40 mg Subcutaneous Q24H  . fluticasone  2 spray Each Nare Daily  . furosemide  60 mg Intravenous BID  . gabapentin  300 mg Oral BID  . insulin aspart  0-20 Units Subcutaneous Q4H  . labetalol  200 mg Oral BID  . latanoprost  1 drop Right Eye QHS  . levofloxacin (LEVAQUIN) IV  750 mg Intravenous Q24H  . loratadine  10 mg Oral Daily  . magnesium sulfate 1 - 4 g bolus IVPB  2 g Intravenous Once  . metolazone  5 mg Oral BID  . montelukast  10 mg Oral QHS  . potassium chloride  10 mEq Intravenous Q1 Hr x 4  .  potassium chloride  40 mEq Oral Once  . sodium chloride  3 mL Intravenous Q12H  . tamsulosin  0.4 mg Oral QHS   Continuous Infusions: . sodium chloride 50 mL/hr (04/15/13 0123)    Active Problems:   Other and unspecified hyperlipidemia   Morbid obesity   Essential hypertension, benign   CAD, NATIVE VESSEL   SVT/ PSVT/ PAT   Acute on chronic diastolic heart failure   Acute renal failure   Chronic pain syndrome   UTI (lower urinary tract infection)   UTI (urinary tract infection)   Noncompliance   Chronic renal insufficiency   Diastolic CHF   Blindness of left eye   Hypokalemia    Time spent: 30 min    Rollande Thursby  [facility] Pager [phone]. If 7PM-7AM, please contact night-coverage at [email or web address], password [facility] 04/15/2013, 10:04 AM  LOS: 1 day

## 2013-04-15 NOTE — Progress Notes (Signed)
Text MD, to critical lab writer just received:  + 2 out of 4 bottles aerobic from 04/14/13:  Gram + cocci in pair and chains.

## 2013-04-15 NOTE — Care Management Note (Addendum)
    Page 1 of 2   04/22/2013     2:33:19 PM   CARE MANAGEMENT NOTE 04/22/2013  Patient:  [name], [name]   Account Number:  [id number]  Date Initiated:  04/15/2013  Documentation initiated by:  [facility]  Subjective/Objective Assessment:   UTI     Action/Plan:   FROM HOME  SPOUSE IN HOSPITAL  ALSO.   Anticipated DC Date:  04/22/2013   Anticipated DC Plan:  SKILLED NURSING FACILITY      DC Planning Services  CM consult      Choice offered to / List presented to:             Status of service:  Completed, signed off Medicare Important Message given?  YES (If response is "NO", the following Medicare IM given date fields will be blank) Date Medicare IM given:  04/20/2013 Date Additional Medicare IM given:  04/20/2013  Discharge Disposition:  SKILLED NURSING FACILITY  Per UR Regulation:  Reviewed for med. necessity/level of care/duration of stay  If discussed at Long Length of Stay Meetings, dates discussed:   04/19/2013    Comments:  04/22/13 [name] Governale RN,BSN NCM [phone] D/C SNF.  04/20/13 [name] Kinkead RN,BSN NCM [phone] SPOKE TO PATIENT IN LENGTH,ALSO ON PHONE-SON/2 DAUGHTERS ABOUT RECOMMENDATION OF SNF.PATIENT INITIALLY WANTED TO GO HOME W/HH,BUT AFTER EXPLAINING THAT HHC IS INTERMITTENT,& THEY TEACH YOU OR FAMILY HOW TO PERFORM THE SKILL WHICH HE WOULD NEED OF IM INJECTIONS.ALSO INFORMED OF HOME HEALTH AIDE SERVICES INTERMITTENT ALONG W/THE SKILLED NURSE.PROVIDED W/PRIVATE SITTER LIST.FAMILY WOULD NOT BE AVAILABLE AROUND THE CLOCK.SO AFTER MD SPOKE TO PATIENT HE AGREED TO SNF.NO BED AVAILABLE UNTIL FRIDAY.SW ALREADY FOLLOWING.  04/18/13 [name] Moncus RN,BSN NCM [phone] PT-SNF.UTI,CHF.IV ABX,IV LASIX.  [name] Crass RN NCM SPOKE TO GRANDSON WHO PROVIDED ME W/PATIENT'S DTR TEL#[name]-A PHARMACIST C#[phone] OR [id number]. PT-SNF.

## 2013-04-15 NOTE — Progress Notes (Signed)
   CARE MANAGEMENT NOTE 04/15/2013  Patient:  CANDACE, RAMUS   Account Number:  [id number]  Date Initiated:  04/15/2013  Documentation initiated by:  [facility]  Subjective/Objective Assessment:   UTI     Action/Plan:   FROM HOME  SPOUSE IN HOSPITAL  ALSO.   Anticipated DC Date:  04/19/2013   Anticipated DC Plan:  SKILLED NURSING FACILITY      DC Planning Services  CM consult      Choice offered to / List presented to:             Status of service:  In process, will continue to follow Medicare Important Message given?   (If response is "NO", the following Medicare IM given date fields will be blank) Date Medicare IM given:   Date Additional Medicare IM given:    Discharge Disposition:    Per UR Regulation:  Reviewed for med. necessity/level of care/duration of stay  If discussed at Long Length of Stay Meetings, dates discussed:    Comments:  Tova Vater RN NCM SPOKE TO GRANDSON WHO PROVIDED ME W/PATIENT'S DTR [facility] PHARMACIST C#[phone] OR [id number]. PT-SNF.

## 2013-04-15 NOTE — Progress Notes (Signed)
Placed patient on CPAP/ nasal mask  with a max pressure of 20cmH2O and a minimum pressure of 6cmH2O.  Patient is tolerating without any signs of distress. Advised RT will continue to monitor.

## 2013-04-15 NOTE — Progress Notes (Signed)
Pt slept throughout the night, noticed serosangious drainage from right ear. Pt complained pain in right ear. Instructed patient to keep fingers out of ear. SRP, RN

## 2013-04-15 NOTE — Progress Notes (Signed)
PHARMACY - BRIEF NOTE  28 YOM admitted with dizziness and fatigue.  Orders for pharmacy to adjust lovenox dose for VTE prophylaxis  Patient wt: 152.9kg Patient ht: 6' BMI: 45.7  SCr: 1.97 for est CrCl = 74ml/min CBC: Hgb = 10.5, plts = 86  Plan:   Based on BMI >30, start lovenox (0.5mg /kg) 75mg  SQ q24h  Monitor renal fx and if CrCl falls <13ml/min then will need to adjust dose  Pharmacy to follow at distance and write note if dose change indicated.   Thanks for consult  Juliette Alcide, PharmD, BCPS.   Pager: [phone] 04/15/2013 11:07 AM

## 2013-04-15 NOTE — Progress Notes (Signed)
Pt stated that he didn't want to wear the CPAP tonight.  Advised the pt if he changes his mind that the RT would come back by and apply the CPAP for him.

## 2013-04-15 NOTE — Progress Notes (Signed)
The order for simvastatin(Zocor) was changed to an equivalent dose of atorvastatin(Lipitor) due to the potential drug interaction with Norvasc.  When taken in combination with medications that inhibit its metabolism, simvastatin can accumulate which increases the risk of liver toxicity, myopathy, or rhabdomyolysis.  Simvastatin dose should not exceed 10mg /day in patients taking verapamil, diltiazem, fibrates, or niacin >or= 1g/day.   Simvastatin dose should not exceed 20mg /day in patients taking amlodipine, ranolazine or amiodarone.   Please consider this potential interaction at discharge.  Lorenza Evangelist 04/15/2013 6:14 AM

## 2013-04-15 NOTE — Progress Notes (Addendum)
ANTIBIOTIC CONSULT NOTE - INITIAL  Pharmacy Consult for levofloxacin/vancomycin Indication: possible mastoiditis and UTI  Allergies  Allergen Reactions  . Ambien [Zolpidem Tartrate]     Makes him cuss like a sailor  . Lyrica [Pregabalin]     Causes him to have fluid  . Penicillins Hives    nightmares  . Piroxicam     unknown  . Sulfonamide Derivatives     Night sweats    Patient Measurements: Height: 6' (182.9 cm) Weight: 337 lb 1.3 oz (152.9 kg) IBW/kg (Calculated) : 77.6 Adjusted Body Weight:   Vital Signs: Temp: 98.2 F (36.8 C) (12/19 0435) Temp src: Oral (12/19 0435) BP: 128/65 mmHg (12/19 0435) Pulse Rate: 75 (12/19 0435) Intake/Output from previous day:   Intake/Output from this shift:    Labs:  Recent Labs  04/14/13 2050 04/15/13 0215  WBC 6.9  --   HGB 10.5*  --   PLT 86*  --   CREATININE 1.60* 1.80*   Estimated Creatinine Clearance: 50.7 ml/min (by C-G formula based on Cr of 1.8). No results found for this basename: VANCOTROUGH, VANCOPEAK, VANCORANDOM, GENTTROUGH, GENTPEAK, GENTRANDOM, TOBRATROUGH, TOBRAPEAK, TOBRARND, AMIKACINPEAK, AMIKACINTROU, AMIKACIN,  in the last 72 hours   Microbiology: No results found for this or any previous visit (from the past 720 hour(s)).  Medical History: Past Medical History  Diagnosis Date  . PSVT (paroxysmal supraventricular tachycardia)     AVNRT  . Coronary atherosclerosis of native coronary artery     Nonobstructive, LVEF 65%  . Hyperlipidemia   . Morbid obesity   . Arthritis   . Type 2 diabetes mellitus   . Vertigo   . Venous insufficiency   . Sleep apnea, obstructive   . Chronic diastolic heart failure    Assessment: 37 YOM presents with fatigue and dizziness.  Given Ceftriaxone x 1 in ED then started Cipro at admission for possible UTI.  RN notes serosanguinous drainage from ear.  Orders to stop cipro and start levofloxacin for possible UTI and mastoiditis. Adding vancomycin for gram positive  bacteremia.    12/18 >> ceftriaxone x 1 12/19 >> ciprofloxacin x1 12/19 >>levofloxacin  >>    Tmax: 100.7 WBCs: 6.9 Renal: Scr = 1.97 for est CrCl 60ml/min (C-G) and 74ml/min (N)  12/18 blood: GPC pairs/chains 12/19 blood: pending 12/19 urine: pending (UA + pyuria)   Goal of Therapy:  Appropriately dose for renal function and weight  Plan:   Vancomycin 2gm IV x 1 now then give 2gm tomorrow (to complete staggered loading dose) then 2gm IV q48h  Watch renal function, if improves can change to q24h  May need to check trough or random level prior to steady state to determine adequate dosing  Based on current renal function, change levofloxacin 750mg  IV q48h  Watch SCr trend closely  Await culture results  Juliette Alcide, PharmD, BCPS.   Pager: [phone]  04/15/2013,7:35 AM

## 2013-04-15 NOTE — Evaluation (Signed)
Physical Therapy Evaluation Patient Details Name: [name] MRN: [id number] DOB: [birth year] Today's Date: 04/15/2013 Time: 4098-1191 PT Time Calculation (min): 20 min  PT Assessment / Plan / Recommendation History of Present Illness  79 y.o. 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 He had chronic vertigo that has been getting worse. Today he was upset because his wife had to go to surgery. He had some potato soup and then was belching and then vomited. He's been having chillsx 3 months w/o fever. However today had measured fever in the EDy. Stable shortness of breath. Legs has been chronically swollen. Had flu shot this year. Per Dr. Arvilla Meres (cardiology) note from 06/23/2012 discharge weight range 370-372 pounds. Patient's current weight is 337 pounds.   Clinical Impression  Pt admitted with bilateral otitis media and possible mastoiditis, draining pus and blood out of the right ear since overnight. Pt currently with functional limitations due to the deficits listed below (see PT Problem List).  Pt will benefit from skilled PT to increase their independence and safety with mobility to allow discharge to the venue listed below.  Pt's spouse also in hospital.  Grandson present as well and reports he plans to speak to his family about increasing care at home so recommend ST-SNF unless home assist arrangements can be made prior to d/c.      PT Assessment  Patient needs continued PT services    Follow Up Recommendations  SNF    Does the patient have the potential to tolerate intense rehabilitation      Barriers to Discharge        Equipment Recommendations  None recommended by PT    Recommendations for Other Services     Frequency Min 3X/week    Precautions / Restrictions  Precautions Precautions: Fall   Pertinent Vitals/Pain Pt denies dizziness with mobility, states he did have some previously however currently without dizziness.      Mobility  Bed Mobility Bed Mobility: Supine to Sit Supine to Sit: 6: Modified independent (Device/Increase time);With rails Details for Bed Mobility Assistance: increased time and effort Transfers Transfers: Sit to Stand;Stand to Sit Sit to Stand: 4: Min assist;With upper extremity assist;From bed Stand to Sit: 4: Min assist;With upper extremity assist;To chair/3-in-1 Details for Transfer Assistance: verbal cues for safe technique, uses rocking technique to stand Ambulation/Gait Ambulation/Gait Assistance: 4: Min guard Ambulation Distance (Feet): 100 Feet Assistive device: Rolling walker Ambulation/Gait Assistance Details: slow pace but steady gait with RW Gait Pattern: Step-through pattern;Decreased stride length;Wide base of support General Gait Details: ambulated to spouse's room and recliner brought to her room as well for both pts to visit (RN aware and okay with this)    Exercises     PT Diagnosis: Difficulty walking  PT Problem List: Decreased activity tolerance;Decreased mobility;Obesity PT Treatment Interventions: DME instruction;Gait training;Functional mobility training;Therapeutic activities;Therapeutic exercise;Patient/family education     PT Goals(Current goals can be found in the care plan section) Acute Rehab PT Goals PT Goal Formulation: With patient Time For Goal Achievement: 04/22/13 Potential to Achieve Goals: Good  Visit Information  Last PT Received On: 04/15/13 Assistance Needed: +1 History of Present Illness: 79 y.o. 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 He had chronic vertigo that has been getting  worse. Today he was upset because his wife had to go to surgery. He had some potato soup and then was belching and then vomited. He's been having chillsx 3 months w/o fever. However today had measured fever in the EDy. Stable shortness of breath. Legs has been chronically swollen. Had flu shot this year. Per Dr. Arvilla Meres (cardiology) note from 06/23/2012 discharge weight range 370-372 pounds. Patient's current weight is 337 pounds.        Prior Functioning  Home Living Family/patient expects to be discharged to:: Private residence Living Arrangements: Spouse/significant other Type of Home: House Home Access: Ramped entrance Home Layout: One level Home Equipment: Environmental consultant - 2 wheels;Wheelchair - manual;Bedside commode Prior Function Level of Independence: Independent with assistive device(s) Communication Communication: HOH    Cognition  Cognition Arousal/Alertness: Awake/alert Behavior During Therapy: WFL for tasks assessed/performed Overall Cognitive Status: Within Functional Limits for tasks assessed    Extremity/Trunk Assessment Lower Extremity Assessment Lower Extremity Assessment: Overall WFL for tasks assessed   Balance    End of Session PT - End of Session Equipment Utilized During Treatment: Gait belt Activity Tolerance: Patient tolerated treatment well Patient left: in chair;with family/visitor present;Other (comment) (in spouse's room 1410, RN aware, grandson present)  GP     Tychelle Purkey,KATHrine E 04/15/2013, 3:06 PM Zenovia Jarred, PT, DPT 04/15/2013 Pager: [phone]

## 2013-04-16 DIAGNOSIS — N390 Urinary tract infection, site not specified: Secondary | ICD-10-CM

## 2013-04-16 DIAGNOSIS — B954 Other streptococcus as the cause of diseases classified elsewhere: Secondary | ICD-10-CM

## 2013-04-16 DIAGNOSIS — I509 Heart failure, unspecified: Secondary | ICD-10-CM

## 2013-04-16 DIAGNOSIS — B961 Klebsiella pneumoniae [K. pneumoniae] as the cause of diseases classified elsewhere: Secondary | ICD-10-CM

## 2013-04-16 DIAGNOSIS — R7881 Bacteremia: Secondary | ICD-10-CM

## 2013-04-16 DIAGNOSIS — E876 Hypokalemia: Secondary | ICD-10-CM

## 2013-04-16 DIAGNOSIS — N189 Chronic kidney disease, unspecified: Secondary | ICD-10-CM

## 2013-04-16 DIAGNOSIS — J329 Chronic sinusitis, unspecified: Secondary | ICD-10-CM

## 2013-04-16 DIAGNOSIS — H6690 Otitis media, unspecified, unspecified ear: Secondary | ICD-10-CM

## 2013-04-16 DIAGNOSIS — R609 Edema, unspecified: Secondary | ICD-10-CM

## 2013-04-16 LAB — COMPREHENSIVE METABOLIC PANEL
ALT: 10 U/L (ref 0–53)
CO2: 26 mEq/L (ref 19–32)
Calcium: 8.6 mg/dL (ref 8.4–10.5)
Creatinine, Ser: 2.12 mg/dL — ABNORMAL HIGH (ref 0.50–1.35)
GFR calc Af Amer: 32 mL/min — ABNORMAL LOW (ref >90)
GFR calc non Af Amer: 28 mL/min — ABNORMAL LOW (ref >90)
Glucose, Bld: 157 mg/dL — ABNORMAL HIGH (ref 70–99)

## 2013-04-16 LAB — BASIC METABOLIC PANEL
CO2: 25 mEq/L (ref 19–32)
Chloride: 97 mEq/L (ref 96–112)
Creatinine, Ser: 2.39 mg/dL — ABNORMAL HIGH (ref 0.50–1.35)
GFR calc Af Amer: 28 mL/min — ABNORMAL LOW (ref >90)
GFR calc non Af Amer: 24 mL/min — ABNORMAL LOW (ref >90)
Potassium: 3.1 mEq/L — ABNORMAL LOW (ref 3.5–5.1)
Sodium: 135 mEq/L (ref 135–145)

## 2013-04-16 LAB — GLUCOSE, CAPILLARY
Glucose-Capillary: 143 mg/dL — ABNORMAL HIGH (ref 70–99)
Glucose-Capillary: 153 mg/dL — ABNORMAL HIGH (ref 70–99)
Glucose-Capillary: 173 mg/dL — ABNORMAL HIGH (ref 70–99)
Glucose-Capillary: 177 mg/dL — ABNORMAL HIGH (ref 70–99)
Glucose-Capillary: 180 mg/dL — ABNORMAL HIGH (ref 70–99)
Glucose-Capillary: 181 mg/dL — ABNORMAL HIGH (ref 70–99)

## 2013-04-16 LAB — URINE CULTURE: Colony Count: >=100000

## 2013-04-16 LAB — IRON AND TIBC
Iron: 32 ug/dL — ABNORMAL LOW (ref 42–135)
TIBC: 246 ug/dL (ref 215–435)

## 2013-04-16 LAB — MAGNESIUM: Magnesium: 2.2 mg/dL (ref 1.5–2.5)

## 2013-04-16 LAB — TRANSFERRIN: Transferrin: 193 mg/dL — ABNORMAL LOW (ref 200–360)

## 2013-04-16 LAB — FOLATE RBC: RBC Folate: 1258 ng/mL — ABNORMAL HIGH (ref >280)

## 2013-04-16 MED ORDER — METOLAZONE 2.5 MG PO TABS
2.5000 mg | ORAL_TABLET | Freq: Once | ORAL | Status: AC
  Administered 2013-04-17: 2.5 mg via ORAL
  Filled 2013-04-16: qty 1

## 2013-04-16 MED ORDER — FUROSEMIDE 10 MG/ML IJ SOLN
80.0000 mg | Freq: Two times a day (BID) | INTRAMUSCULAR | Status: DC
  Administered 2013-04-16 – 2013-04-21 (×11): 80 mg via INTRAVENOUS
  Filled 2013-04-16 (×11): qty 8

## 2013-04-16 MED ORDER — POTASSIUM CHLORIDE CRYS ER 20 MEQ PO TBCR
40.0000 meq | EXTENDED_RELEASE_TABLET | Freq: Three times a day (TID) | ORAL | Status: DC
  Administered 2013-04-16: 11:00:00 40 meq via ORAL
  Filled 2013-04-16 (×3): qty 2

## 2013-04-16 MED ORDER — LEVOFLOXACIN 750 MG PO TABS: 750.0000 mg | ORAL_TABLET | ORAL | Status: DC

## 2013-04-16 MED ORDER — POTASSIUM CHLORIDE CRYS ER 20 MEQ PO TBCR
40.0000 meq | EXTENDED_RELEASE_TABLET | ORAL | Status: DC
  Administered 2013-04-16 – 2013-04-18 (×11): 40 meq via ORAL
  Filled 2013-04-16 (×17): qty 2

## 2013-04-16 MED ORDER — POTASSIUM CHLORIDE 10 MEQ/100ML IV SOLN
10.0000 meq | INTRAVENOUS | Status: AC
  Administered 2013-04-16 (×2): 10 meq via INTRAVENOUS
  Filled 2013-04-16 (×2): qty 100

## 2013-04-16 MED ORDER — DEXTROSE 5 % IV SOLN
2.0000 g | INTRAVENOUS | Status: DC
  Administered 2013-04-16 – 2013-04-21 (×6): 2 g via INTRAVENOUS
  Filled 2013-04-16 (×7): qty 2

## 2013-04-16 NOTE — Progress Notes (Signed)
Found patient asleep without his CPAP applied. Awakened and offered assistance with application. He refuses with the complaint that it does not fit right. He explains that he uses nasal pillows at home and that he feels "suffocated" with any type mask. When asked if there was someone who could bring his apparatus to him, he states he does not have good contact with his family and that there is probably no one to bring his mask to him. He further states that he hopes to only be in the hospital for about two more days and that he feels fine using his nasal spray and sitting more upright in the bed. He is aware that RT is available if he should change his mind and require assistance. He is encouraged to call RT at any time, day or night, that he should wish to attempt becoming more acclimated to the hospital supplied apparatus since nasal pillows are not an option at this time.
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
## 2013-04-16 NOTE — Progress Notes (Signed)
Pt continues to decline CPAP tonight. O2 sats 97% on RA at this time

## 2013-04-16 NOTE — Progress Notes (Signed)
[facility] PROGRESS NOTE  [name] ZOX:[id number] DOB: [birth year] DOA: 04/14/2013 PCP: [name] Pizza, MD  Assessment/Plan  Bilateral otitis media and possible mastoiditis, continuing to drain pus from the right ear -  Continue levofloxacin -  Continue ciprodex ear drops for right ear -  Spoke with Dr. Lazarus [name] from ENT who recommended 1 month of low dose clindamycin 150mg  TID and 2 weeks of FQ then f/u with ENT in 6 weeks.    Strep bacteremia -  Currently on vancomycin and levofloxacin, however, can probably narrow coverage -  Will defer to infectious disease -  ECHO was technically severely limited -  F/u blood cultures from 12/19 x 2 -  Blood cultures 2 out of 2 from 12/18:  Strep -  Followup Blood cx 12/20   Dizziness may be related to otitis media/sinusitis and bacteremia.  DDx includes stroke, venous thrombosis -  Consider MRI if not improving with abx -  PT recommending SNF  Sinusitis with purulence draining from right nare -  Continue flonase -  Continue afrin and nasal saline   Acute on chronic diastolic heart failure (dry weight near 329lbs).  Patient has asymmetric edema L>R at baseline although both legs are more swollen recently.   -  Recorded at 1.3L positive yesterday, but lost 1kg weight -  Increase to lasix 80mg  IV BID -  Metolazone normally given Sun and Wed so will give dose tomorrow -  Daily weights, strict I/O -  Trend electrolytes -  troponins neg  HTN/HLD, stable.  Continue norvasc, atorvastatin,   T2DM with peripheral neuropathy, CBG very mildly elevated -  Continue gabapentin for neuropathy -  Trend CBG -  Continue high dose SSI   CKD stage 3, baseline creatinine 1.8 -  Minimize nephrotoxins, renally dose medications including gabapentin  UTI:  GNR -  Continue abx  -  F/u speciation and sensitivities of urine culture -  F/u blood cultures  Hypokalemia, likely secondary to diuretic use, received of potassium yesterday with no  change in serum potassium -  Start potassium chloride 40 meq q4h  -  Potassium chloride IV once  -  Repeat potassium level at 1600 today -  Keep magnesium near 2 (currently 2.2)  Hearing impairment, may be worsened by OM.     Normocytic anemia -  Iron panel, B12, folate pending -  TSH wnl  Thrombocytopenia, chronic.  Large platelets on smear -  Check for vitamin def as above  Diet:  Healthy heart, 2gm, 1.2L fluid restriction Access:  PIV IVF:  OFF Proph:  lovenox  Code Status: full Family Communication: patient and his grandson Disposition Plan:  To SNF.  Per phone conversation with ID, will need at least one week of IV antibiotics before transitioning to oral.    Consultants:  ENT by phone  ID   Procedures:  CT head  Antibiotics:  Ciprofloxacin x 1  Levofloxacin 12/19 >>   Vancomycin 12/19 >>  HPI/Subjective:  Right ear still draining pus.  Feeling a little better today.  Denies SOB, nausea, vomiting.  Still with severe sinus congestion.    Objective: Filed Vitals:   04/15/13 1450 04/15/13 2006 04/16/13 0145 04/16/13 0500  BP: 122/59 124/41  127/62  Pulse: 70 72  70  Temp: 98 F (36.7 C) 99.4 F (37.4 C)  98.9 F (37.2 C)  TempSrc: Oral Oral  Oral  Resp: 18   16  Height:      Weight:  151.955 kg (335 lb)  SpO2: 97% 93% 97% 97%    Intake/Output Summary (Last 24 hours) at 04/16/13 1133 Last data filed at 04/16/13 7829  Gross per 24 hour  Intake   3630 ml  Output   2250 ml  Net   1380 ml   Filed Weights   04/15/13 0010 04/16/13 0500  Weight: 152.9 kg (337 lb 1.3 oz) 151.955 kg (335 lb)    Exam:   General:  [name], No acute distress  HEENT:  NCAT, MMM, purulence still draining from right ear.    Cardiovascular:  RRR, nl S1, S2 no mrg, 2+ pulses, warm extremities  Respiratory:  Diminished bilateral BS, no obvious wheeze, rales, rhonchi, no increased WOB  Abdomen:   NABS, soft, NT/ND  MSK:   Normal tone and bulk, 3+ LLE and  2+ RLE edema  Neuro:  Grossly intact  Data Reviewed: Basic Metabolic Panel:  Recent Labs Lab 04/14/13 2050 04/15/13 0215 04/15/13 0802 04/16/13 0550  NA 144 140 141 136  K 2.7* 2.7* 2.8* 2.8*  CL 103 102 102 99  CO2 26 26 26 26   GLUCOSE 167* 163* 145* 157*  BUN 30* 32* 35* 36*  CREATININE 1.60* 1.80* 1.97* 2.12*  CALCIUM 9.5 8.9 8.9 8.6  MG  --  1.9  2.0  --  2.2   Liver Function Tests:  Recent Labs Lab 04/14/13 2050 04/15/13 0215 04/16/13 0550  AST 23 19 21   ALT 11 10 10   ALKPHOS 58 48 46  BILITOT 1.1 1.2 1.0  PROT 6.4 5.7* 5.7*  ALBUMIN 3.7 3.2* 3.0*    Recent Labs Lab 04/14/13 2050  LIPASE 40   No results found for this basename: AMMONIA,  in the last 168 hours CBC:  Recent Labs Lab 04/14/13 2050  WBC 6.9  NEUTROABS 5.2  HGB 10.5*  HCT 29.3*  MCV 92.7  PLT 86*   Cardiac Enzymes:  Recent Labs Lab 04/15/13 0215 04/15/13 0802 04/15/13 1403  TROPONINI <0.30 <0.30 <0.30   BNP (last 3 results)  Recent Labs  04/14/13 2050  PROBNP 1021.0*   CBG:  Recent Labs Lab 04/15/13 1657 04/15/13 2128 04/16/13 0038 04/16/13 0448 04/16/13 0736  GLUCAP 169* 183* 154* 143* 181*    Recent Results (from the past 240 hour(s))  URINE CULTURE     Status: None   Collection Time    04/14/13  8:29 PM      Result Value Range Status   Specimen Description URINE, CLEAN CATCH   Final   Special Requests NONE   Final   Culture  Setup Time     Final   Value: 04/15/2013 02:05     Performed at [facility] Count     Final   Value: >=100,000 COLONIES/ML     Performed at [facility]   Culture     Final   Value: GRAM NEGATIVE RODS     Performed at [facility]   Report Status PENDING   Incomplete  CULTURE, BLOOD (ROUTINE X 2)     Status: None   Collection Time    04/14/13  9:44 PM      Result Value Range Status   Specimen Description BLOOD RIGHT ANTECUBITAL   Final   Special Requests BOTTLES DRAWN AEROBIC AND  ANAEROBIC 5CC   Final   Culture  Setup Time     Final   Value: 04/15/2013 02:04     Performed at [facility]   Culture     Final   Value: STREPTOCOCCUS SPECIES     Note: Gram Stain Report Called to,Read Back By and Verified With: [facility] WILLARD 04/15/13 1415 BY SMITHERSJ     Performed at [facility]   Report Status PENDING   Incomplete  CULTURE, BLOOD (ROUTINE X 2)     Status: None   Collection Time    04/14/13  9:50 PM      Result Value Range Status   Specimen Description BLOOD RIGHT HAND   Final   Special Requests BOTTLES DRAWN AEROBIC AND ANAEROBIC 5CC   Final   Culture  Setup Time     Final   Value: 04/15/2013 02:03     Performed at [facility]   Culture     Final   Value: STREPTOCOCCUS SPECIES     Note: Gram Stain Report Called to,Read Back By and Verified With: [facility] WILLARD 04/15/13 1415 BY SMITHERSJ     Performed at [facility]   Report Status PENDING   Incomplete     Studies: Dg Chest 2 View  04/14/2013   CLINICAL DATA:  Fever, emesis, history diabetes  EXAM: CHEST  2 VIEW  COMPARISON:  06/24/2012  FINDINGS: Enlargement of cardiac silhouette with pulmonary vascular congestion.  Atherosclerotic calcification aorta.  Minimal bibasilar atelectasis.  No acute failure or consolidation.  No pleural effusion or pneumothorax.  Scattered endplate spur formation thoracic spine.  IMPRESSION: Enlargement of cardiac silhouette with pulmonary vascular congestion.  Bibasilar atelectasis.   Electronically Signed   By: Ulyses Southward M.D.   On: 04/14/2013 21:32   Ct Head Wo Contrast  04/14/2013   CLINICAL DATA:  Dizziness  EXAM: CT HEAD WITHOUT CONTRAST  TECHNIQUE: Contiguous axial images were obtained from the base of the skull through the vertex without intravenous contrast.  COMPARISON:  06/24/2006  FINDINGS: Skull and Sinuses:Bilateral mastoid and middle ear opacification, present on the right in 2008. Patchy mucosal thickening in the bilateral paranasal  sinuses, most severe in the left maxillary antrum, right anterior ethmoids, and right frontal sinus. Some of the contents is high-density, compatible with inspissated secretions or fungal colonization. Linear foreign body in the left maxillary antrum is likely a displaced plate for treatment of remote blowout fracture of the left orbital floor - also seen in 2008. There is also a remote blowout fracture of the medial wall left orbit.  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 Right proptosis, without retro-orbital mass or inflammation.  Brain: No evidence of acute abnormality, such as acute infarction, hemorrhage, hydrocephalus, or mass lesion/mass effect. There is atrophy and mild chronic small vessel ischemic white matter changes.  IMPRESSION: 1. No evidence of acute intracranial disease. 2. Bilateral mastoid and middle ear opacification. Consider mastoiditis as a cause of the patient's dizziness. 3. Multifocal inflammatory paranasal sinus disease. 4. Remote trauma to the left orbit, discussed above.   Electronically Signed   By: Tiburcio Pea M.D.   On: 04/14/2013 21:51    Scheduled Meds: . amLODipine  10 mg Oral Daily  . aspirin EC  81 mg Oral Daily  . atorvastatin  20 mg Oral QHS  . cholecalciferol  5,000 Units Oral Daily  . ciprofloxacin-dexamethasone  4 drop Right Ear BID  . enoxaparin (LOVENOX) injection  75 mg Subcutaneous Q24H  . fluticasone  2 spray Each Nare Daily  . furosemide  60 mg Intravenous BID  . gabapentin  300 mg Oral BID  . insulin aspart  0-20 Units Subcutaneous Q4H  . labetalol  200 mg Oral BID  . latanoprost  1 drop Right Eye QHS  . [START ON 04/17/2013] levofloxacin  750 mg Oral Q48H  . loratadine  10 mg Oral Daily  . montelukast  10 mg Oral QHS  . oxymetazoline  1 spray Each Nare BID  . potassium chloride  40 mEq Oral TID  . saccharomyces boulardii  250 mg Oral BID  . sodium chloride  3 mL Intravenous Q12H  . tamsulosin  0.4 mg Oral QHS  . vancomycin  2,000 mg  Intravenous Q48H   Continuous Infusions:    Active Problems:   Other and unspecified hyperlipidemia   Morbid obesity   Essential hypertension, benign   CAD, NATIVE VESSEL   SVT/ PSVT/ PAT   Acute on chronic diastolic heart failure   Acute renal failure   Chronic pain syndrome   UTI (lower urinary tract infection)   UTI (urinary tract infection)   Noncompliance   Chronic renal insufficiency   Diastolic CHF   Blindness of left eye   Hypokalemia    Time spent: 30 min    Emnet Monk, [facility]  [facility] Pager [phone]. If 7PM-7AM, please contact night-coverage at [email or web address], password [facility] 04/16/2013, 11:33 AM  LOS: 2 days

## 2013-04-16 NOTE — Progress Notes (Signed)
CCM called and stated that patient had 5 beats of VTACH and then 5 additional beats a minute later. Pulled strip and placed om chart. VSS. Pt asymptomatic. Pt was noted to be sleeping during incident. Paged mid-level on call. No new orders placed and will continue to monitor.

## 2013-04-17 DIAGNOSIS — B953 Streptococcus pneumoniae as the cause of diseases classified elsewhere: Secondary | ICD-10-CM

## 2013-04-17 LAB — BASIC METABOLIC PANEL
BUN: 34 mg/dL — ABNORMAL HIGH (ref 6–23)
Chloride: 101 mEq/L (ref 96–112)
GFR calc Af Amer: 33 mL/min — ABNORMAL LOW (ref >90)
GFR calc non Af Amer: 29 mL/min — ABNORMAL LOW (ref >90)
Potassium: 3.4 mEq/L — ABNORMAL LOW (ref 3.5–5.1)
Sodium: 137 mEq/L (ref 135–145)

## 2013-04-17 LAB — GLUCOSE, CAPILLARY
Glucose-Capillary: 181 mg/dL — ABNORMAL HIGH (ref 70–99)
Glucose-Capillary: 187 mg/dL — ABNORMAL HIGH (ref 70–99)
Glucose-Capillary: 190 mg/dL — ABNORMAL HIGH (ref 70–99)

## 2013-04-17 LAB — CULTURE, BLOOD (ROUTINE X 2)

## 2013-04-17 LAB — CBC WITH DIFFERENTIAL/PLATELET
Basophils Relative: 0 % (ref 0–1)
Eosinophils Absolute: 0.2 10*3/uL (ref 0.0–0.7)
HCT: 27.5 % — ABNORMAL LOW (ref 39.0–52.0)
Hemoglobin: 9.5 g/dL — ABNORMAL LOW (ref 13.0–17.0)
Lymphocytes Relative: 17 % (ref 12–46)
Lymphs Abs: 0.9 10*3/uL (ref 0.7–4.0)
MCH: 32.6 pg (ref 26.0–34.0)
MCHC: 34.5 g/dL (ref 30.0–36.0)
Monocytes Relative: 12 % (ref 3–12)
Neutro Abs: 3.6 10*3/uL (ref 1.7–7.7)
Neutrophils Relative %: 67 % (ref 43–77)
Platelets: 65 10*3/uL — ABNORMAL LOW (ref 150–400)
RBC: 2.91 MIL/uL — ABNORMAL LOW (ref 4.22–5.81)

## 2013-04-17 LAB — POTASSIUM: Potassium: 3.4 mEq/L — ABNORMAL LOW (ref 3.5–5.1)

## 2013-04-17 LAB — PROTIME-INR: INR: 1.15 (ref 0.00–1.49)

## 2013-04-17 LAB — MAGNESIUM: Magnesium: 2 mg/dL (ref 1.5–2.5)

## 2013-04-17 MED ORDER — VITAMIN B-12 1000 MCG PO TABS
1000.0000 ug | ORAL_TABLET | Freq: Every day | ORAL | Status: DC
  Administered 2013-04-17 – 2013-04-22 (×6): 1000 ug via ORAL
  Filled 2013-04-17 (×6): qty 1

## 2013-04-17 MED ORDER — INSULIN DETEMIR 100 UNIT/ML ~~LOC~~ SOLN
10.0000 [IU] | Freq: Every day | SUBCUTANEOUS | Status: DC
  Administered 2013-04-17: 21:00:00 10 [IU] via SUBCUTANEOUS
  Filled 2013-04-17: qty 0.1

## 2013-04-17 MED ORDER — INSULIN ASPART 100 UNIT/ML ~~LOC~~ SOLN
0.0000 [IU] | Freq: Three times a day (TID) | SUBCUTANEOUS | Status: DC
  Administered 2013-04-17: 18:00:00 4 [IU] via SUBCUTANEOUS
  Administered 2013-04-18: 12:00:00 3 [IU] via SUBCUTANEOUS
  Administered 2013-04-18 – 2013-04-20 (×6): 4 [IU] via SUBCUTANEOUS
  Administered 2013-04-21: 7 [IU] via SUBCUTANEOUS
  Administered 2013-04-21: 3 [IU] via SUBCUTANEOUS
  Administered 2013-04-21 – 2013-04-22 (×2): 4 [IU] via SUBCUTANEOUS

## 2013-04-17 MED ORDER — METOLAZONE 2.5 MG PO TABS
2.5000 mg | ORAL_TABLET | ORAL | Status: DC
  Administered 2013-04-18: 07:00:00 2.5 mg via ORAL
  Filled 2013-04-17 (×2): qty 1

## 2013-04-17 MED ORDER — INSULIN ASPART 100 UNIT/ML ~~LOC~~ SOLN
0.0000 [IU] | Freq: Every day | SUBCUTANEOUS | Status: DC
  Administered 2013-04-18: 22:00:00 2 [IU] via SUBCUTANEOUS

## 2013-04-17 NOTE — Progress Notes (Addendum)
Patient refused CPAP at this time.  States he does not wish to wear hospital equipment and he will try to get his machine from home. He states our machine is loud and makes noises and that he will be fine without it for tonight. Machine removed from room.

## 2013-04-17 NOTE — Progress Notes (Signed)
INFECTIOUS DISEASE PROGRESS NOTE  ID: [name] is a 79 y.o. male with  Active Problems:   Other and unspecified hyperlipidemia   Morbid obesity   Essential hypertension, benign   CAD, NATIVE VESSEL   SVT/ PSVT/ PAT   Acute on chronic diastolic heart failure   Acute renal failure   Chronic pain syndrome   UTI (lower urinary tract infection)   UTI (urinary tract infection)   Noncompliance   Chronic renal insufficiency   Diastolic CHF   Blindness of left eye   Hypokalemia  Subjective: C/o ear pain earlier in the day, now resolved.   Abtx:  Anti-infectives   Start     Dose/Rate Route Frequency Ordered Stop   04/17/13 1000  levofloxacin (LEVAQUIN) IVPB 750 mg  Status:  Discontinued     750 mg 100 mL/hr over 90 Minutes Intravenous Every 48 hours 04/15/13 1453 04/16/13 1127   04/17/13 1000  levofloxacin (LEVAQUIN) tablet 750 mg  Status:  Discontinued     750 mg Oral Every 48 hours 04/16/13 1127 04/16/13 1616   04/16/13 1800  cefTRIAXone (ROCEPHIN) 2 g in dextrose 5 % 50 mL IVPB     2 g 100 mL/hr over 30 Minutes Intravenous Every 24 hours 04/16/13 1616     04/16/13 1600  vancomycin (VANCOCIN) 2,000 mg in sodium chloride 0.9 % 500 mL IVPB  Status:  Discontinued     2,000 mg 250 mL/hr over 120 Minutes Intravenous Every 48 hours 04/15/13 1453 04/16/13 1616   04/15/13 1600  vancomycin (VANCOCIN) 2,000 mg in sodium chloride 0.9 % 500 mL IVPB     2,000 mg 250 mL/hr over 120 Minutes Intravenous  Once 04/15/13 1446 04/15/13 1855   04/15/13 1200  clindamycin (CLEOCIN) capsule 150 mg  Status:  Discontinued     150 mg Oral 3 times daily 04/15/13 1102 04/15/13 1436   04/15/13 1000  levofloxacin (LEVAQUIN) IVPB 750 mg  Status:  Discontinued     750 mg 100 mL/hr over 90 Minutes Intravenous Every 24 hours 04/15/13 0742 04/15/13 1453   04/15/13 0200  ciprofloxacin (CIPRO) IVPB 400 mg  Status:  Discontinued     400 mg 200 mL/hr over 60 Minutes Intravenous Every 12 hours 04/15/13 0037  04/15/13 0726   04/14/13 2200  cefTRIAXone (ROCEPHIN) 1 g in dextrose 5 % 50 mL IVPB     1 g 100 mL/hr over 30 Minutes Intravenous  Once 04/14/13 2150 04/14/13 2319      Medications:  Scheduled: . amLODipine  10 mg Oral Daily  . aspirin EC  81 mg Oral Daily  . atorvastatin  20 mg Oral QHS  . cefTRIAXone (ROCEPHIN)  IV  2 g Intravenous Q24H  . cholecalciferol  5,000 Units Oral Daily  . ciprofloxacin-dexamethasone  4 drop Right Ear BID  . enoxaparin (LOVENOX) injection  75 mg Subcutaneous Q24H  . fluticasone  2 spray Each Nare Daily  . furosemide  80 mg Intravenous BID  . gabapentin  300 mg Oral BID  . insulin aspart  0-20 Units Subcutaneous TID WC  . insulin aspart  0-5 Units Subcutaneous QHS  . insulin detemir  10 Units Subcutaneous QHS  . labetalol  200 mg Oral BID  . latanoprost  1 drop Right Eye QHS  . loratadine  10 mg Oral Daily  . [START ON 04/18/2013] metolazone  2.5 mg Oral Q48H  . montelukast  10 mg Oral QHS  . oxymetazoline  1 spray Each Nare BID  .  potassium chloride  40 mEq Oral Q4H  . saccharomyces boulardii  250 mg Oral BID  . sodium chloride  3 mL Intravenous Q12H  . tamsulosin  0.4 mg Oral QHS  . vitamin B-12  1,000 mcg Oral Daily    Objective: Vital signs in last 24 hours: Temp:  [98.5 F (36.9 C)-100 F (37.8 C)] 98.5 F (36.9 C) (12/21 1400) Pulse Rate:  [64-88] 88 (12/21 1400) Resp:  [20] 20 (12/21 1400) BP: (120-132)/(41-77) 120/77 mmHg (12/21 1400) SpO2:  [91 %-95 %] 92 % (12/21 1400) Weight:  [157.4 kg (347 lb 0.1 oz)] 157.4 kg (347 lb 0.1 oz) (12/21 0403)   General appearance: alert, cooperative and no distress Ears: no d/c visible. non-tender on R.   Lab Results  Recent Labs  04/14/13 2050  04/16/13 1536 04/17/13 0632 04/17/13 1426  WBC 6.9  --   --  5.3  --   HGB 10.5*  --   --  9.5*  --   HCT 29.3*  --   --  27.5*  --   NA 144  < > 135 137  --   K 2.7*  < > 3.1* 3.4* 3.4*  CL 103  < > 97 101  --   CO2 26  < > 25 26  --   BUN  30*  < > 36* 34*  --   CREATININE 1.60*  < > 2.39* 2.07*  --   < > = values in this interval not displayed. Liver Panel  Recent Labs  04/15/13 0215 04/16/13 0550  PROT 5.7* 5.7*  ALBUMIN 3.2* 3.0*  AST 19 21  ALT 10 10  ALKPHOS 48 46  BILITOT 1.2 1.0   Sedimentation Rate No results found for this basename: ESRSEDRATE,  in the last 72 hours C-Reactive Protein No results found for this basename: CRP,  in the last 72 hours  Microbiology: Recent Results (from the past 240 hour(s))  URINE CULTURE     Status: None   Collection Time    04/14/13  8:29 PM      Result Value Range Status   Specimen Description URINE, CLEAN CATCH   Final   Special Requests NONE   Final   Culture  Setup Time     Final   Value: 04/15/2013 02:05     Performed at [facility] Count     Final   Value: >=100,000 COLONIES/ML     Performed at [facility]   Culture     Final   Value: KLEBSIELLA PNEUMONIAE     Performed at [facility]   Report Status 04/16/2013 FINAL   Final   Organism ID, Bacteria KLEBSIELLA PNEUMONIAE   Final  CULTURE, BLOOD (ROUTINE X 2)     Status: None   Collection Time    04/14/13  9:44 PM      Result Value Range Status   Specimen Description BLOOD RIGHT ANTECUBITAL   Final   Special Requests BOTTLES DRAWN AEROBIC AND ANAEROBIC 5CC   Final   Culture  Setup Time     Final   Value: 04/15/2013 02:04     Performed at [facility]   Culture     Final   Value: STREPTOCOCCUS PNEUMONIAE     Note: SUSCEPTIBILITIES PERFORMED ON PREVIOUS CULTURE WITHIN THE LAST 5 DAYS.     Note: Gram Stain Report Called to,Read Back By and Verified With: [facility] 04/15/13 1415 BY SMITHERSJ  Performed at [facility]   Report Status 04/17/2013 FINAL   Final  CULTURE, BLOOD (ROUTINE X 2)     Status: None   Collection Time    04/14/13  9:50 PM      Result Value Range Status   Specimen Description BLOOD RIGHT HAND   Final   Special Requests  BOTTLES DRAWN AEROBIC AND ANAEROBIC 5CC   Final   Culture  Setup Time     Final   Value: 04/15/2013 02:03     Performed at [facility]   Culture     Final   Value: STREPTOCOCCUS PNEUMONIAE     Note: Gram Stain Report Called to,Read Back By and Verified With: [facility] WILLARD 04/15/13 1415 BY SMITHERSJ     Performed at [facility]   Report Status 04/17/2013 FINAL   Final   Organism ID, Bacteria STREPTOCOCCUS PNEUMONIAE   Final  CULTURE, BLOOD (ROUTINE X 2)     Status: None   Collection Time    04/15/13  2:15 AM      Result Value Range Status   Specimen Description BLOOD RIGHT ANTECUBITAL   Final   Special Requests BOTTLES DRAWN AEROBIC ONLY 10CC   Final   Culture  Setup Time     Final   Value: 04/15/2013 08:55     Performed at [facility]   Culture     Final   Value:        BLOOD CULTURE RECEIVED NO GROWTH TO DATE CULTURE WILL BE HELD FOR 5 DAYS BEFORE ISSUING A FINAL NEGATIVE REPORT     Performed at [facility]   Report Status PENDING   Incomplete  CULTURE, BLOOD (ROUTINE X 2)     Status: None   Collection Time    04/15/13  2:22 AM      Result Value Range Status   Specimen Description BLOOD RIGHT ARM   Final   Special Requests BOTTLES DRAWN AEROBIC AND ANAEROBIC 10CC   Final   Culture  Setup Time     Final   Value: 04/15/2013 08:55     Performed at [facility]   Culture     Final   Value:        BLOOD CULTURE RECEIVED NO GROWTH TO DATE CULTURE WILL BE HELD FOR 5 DAYS BEFORE ISSUING A FINAL NEGATIVE REPORT     Performed at [facility]   Report Status PENDING   Incomplete  CULTURE, BLOOD (SINGLE)     Status: None   Collection Time    04/16/13  5:50 AM      Result Value Range Status   Specimen Description BLOOD RIGHT ARM   Final   Special Requests BOTTLES DRAWN AEROBIC ONLY 8CC   Final   Culture  Setup Time     Final   Value: 04/16/2013 14:12     Performed at [facility]   Culture     Final   Value:        BLOOD  CULTURE RECEIVED NO GROWTH TO DATE CULTURE WILL BE HELD FOR 5 DAYS BEFORE ISSUING A FINAL NEGATIVE REPORT     Performed at [facility]   Report Status PENDING   Incomplete    Studies/Results: No results found.   Assessment/Plan: Otitis Media Sinusitis Pneumococcal bacteremia K pneumo UTI  Would complete 14 days of ceftriaxone.  F/u with ENT.  Watch repeat BCx ? placement Would consider skipping the levaquin in  the original ENT eval and just giving him the clinda? available if questions  Total days of antibiotics: 4 (ceftriaxone)          [name] Infectious Diseases (pager) [phone] [email or web address] 04/17/2013, 3:35 PM  LOS: 3 days

## 2013-04-17 NOTE — Progress Notes (Signed)
[facility] PROGRESS NOTE  FRANKI STEMEN ZOX:[id number] DOB: [birth year] DOA: 04/14/2013 PCP: Catalina Pizza, MD  Assessment/Plan  Bilateral otitis media and possible mastoiditis, continuing to drain pus from the right ear -  Continue ceftriaxone  -  Appreciate ENT and ID recommendations -  Spoke with Dr. Lazarus Salines from ENT who recommended 1 month of low dose clindamycin 150mg  TID and 2 weeks of FQ then f/u with ENT in 6 weeks.   -  Plan for one week of IV or IM antibiotic, then transition to oral thereafter  Strep pneumo bacteremia sensitive to ceftriaxone, FQ, and PCN -  Continue ceftriaxone  -  Appreciate infectious disease recommendations -  ECHO was technically severely limited -  Blood cultures 2 out of 2 from 12/18: Strep pneumo -  Blood cultures 12/19:  NGTD -  Blood culture 12/20:  NGTD  Dizziness may be related to otitis media/sinusitis and bacteremia.  DDx includes stroke, venous thrombosis -  Consider MRI if not improving with abx -  PT recommending SNF  Sinusitis with purulence draining from right nare -  Continue flonase -  Continue afrin and nasal saline   Acute on chronic diastolic heart failure (dry weight near 329lbs).  Patient has asymmetric edema L>R at baseline although both legs are more swollen recently.   -  -1.28L yesterday but now weight increased by 6kg  -  Continue lasix 80mg  IV BID -  Metolazone held this AM due to mild rise in creatinine -  Will restart metolazone tomorrow instead now that creatinine trending down -  Trend electrolytes -  troponins neg  HTN/HLD, stable.  Continue norvasc, atorvastatin,   T2DM with peripheral neuropathy, CBG very mildly elevated -  Continue gabapentin for neuropathy -  Trend CBG -  Continue high dose SSI   CKD stage 3, baseline creatinine 1.8 -  Minimize nephrotoxins, renally dose medications including gabapentin  Klebsiella UTI, pansensitive -  Continue abx   Hypokalemia, likely secondary to diuretic  use, finally rising on frequent potassium -  Continue potassium chloride 40 meq q4h today  -  Repeat potassium level this afternoon -  Keep magnesium near 2   Hearing impairment, may be worsened by OM.     Normocytic anemia, likely anemia of chronic disease and possibly some vitamin B12 deficiency  -  Iron panel consistent with chronic disease -  B12 367, folate 1258 wnl -  TSH wnl -  Start cyanocobalamin daily  Thrombocytopenia, chronic.  Large platelets on smear.  plt decreased dramatically today, no evidence of bleeding.  Rule out mild DIC in setting of infection -  Fibrinogen wnl -  INR wnl -  Start vitamin B12  Diet:  Healthy heart, 2gm, 1.2L fluid restriction Access:  PIV IVF:  OFF Proph:  lovenox  Code Status: full Family Communication: patient and his grandson Disposition Plan:  To SNF.  Per phone conversation with ID, will need at least one week of IV antibiotics before transitioning to oral.    Consultants:  ENT by phone  ID   Procedures:  CT head  Antibiotics:  Ciprofloxacin x 1  Levofloxacin 12/19 >> 12/20  Vancomycin 12/19 >> 12/20  Ceftriaxone 12/20 >>  HPI/Subjective:  Right ear still draining pus.  Feeling a little better today.  Denies SOB, nausea, vomiting.  Still with severe sinus congestion.    Objective: Filed Vitals:   04/16/13 1349 04/16/13 2018 04/16/13 2058 04/17/13 0403  BP: 131/53 125/41 132/46 125/44  Pulse: 69  71 64  Temp: 98.5 F (36.9 C)  100 F (37.8 C) 99.4 F (37.4 C)  TempSrc: Oral Oral Oral Oral  Resp: 20  20 20   Height:      Weight:    157.4 kg (347 lb 0.1 oz)  SpO2: 94% 95% 94% 91%    Intake/Output Summary (Last 24 hours) at 04/17/13 1351 Last data filed at 04/17/13 0350  Gross per 24 hour  Intake     50 ml  Output   1675 ml  Net  -1625 ml   Filed Weights   04/15/13 0010 04/16/13 0500 04/17/13 0403  Weight: 152.9 kg (337 lb 1.3 oz) 151.955 kg (335 lb) 157.4 kg (347 lb 0.1 oz)     Exam:   General:  [name], No acute distress  HEENT:  NCAT, MMM, purulence still draining from right ear.    Cardiovascular:  RRR, nl S1, S2 no mrg, 2+ pulses, warm extremities  Respiratory:  Diminished bilateral BS with faint wheeze, rales, rhonchi, no increased WOB  Abdomen:   NABS, soft, NT/ND  MSK:   Normal tone and bulk, 3+ LLE and 2+ RLE edema  Neuro:  Grossly intact  Data Reviewed: Basic Metabolic Panel:  Recent Labs Lab 04/14/13 2050 04/15/13 0215 04/15/13 0802 04/16/13 0550 04/16/13 1536 04/17/13 0632  NA 144 140 141 136 135 137  K 2.7* 2.7* 2.8* 2.8* 3.1* 3.4*  CL 103 102 102 99 97 101  CO2 26 26 26 26 25 26   GLUCOSE 167* 163* 145* 157* 210* 161*  BUN 30* 32* 35* 36* 36* 34*  CREATININE 1.60* 1.80* 1.97* 2.12* 2.39* 2.07*  CALCIUM 9.5 8.9 8.9 8.6 8.7 8.6  MG  --  1.9  2.0  --  2.2  --  2.0   Liver Function Tests:  Recent Labs Lab 04/14/13 2050 04/15/13 0215 04/16/13 0550  AST 23 19 21   ALT 11 10 10   ALKPHOS 58 48 46  BILITOT 1.1 1.2 1.0  PROT 6.4 5.7* 5.7*  ALBUMIN 3.7 3.2* 3.0*    Recent Labs Lab 04/14/13 2050  LIPASE 40   No results found for this basename: AMMONIA,  in the last 168 hours CBC:  Recent Labs Lab 04/14/13 2050 04/17/13 0632  WBC 6.9 5.3  NEUTROABS 5.2 3.6  HGB 10.5* 9.5*  HCT 29.3* 27.5*  MCV 92.7 94.5  PLT 86* 65*   Cardiac Enzymes:  Recent Labs Lab 04/15/13 0215 04/15/13 0802 04/15/13 1403  TROPONINI <0.30 <0.30 <0.30   BNP (last 3 results)  Recent Labs  04/14/13 2050  PROBNP 1021.0*   CBG:  Recent Labs Lab 04/16/13 2037 04/16/13 2332 04/17/13 0402 04/17/13 0716 04/17/13 1159  GLUCAP 180* 153* 160* 151* 181*    Recent Results (from the past 240 hour(s))  URINE CULTURE     Status: None   Collection Time    04/14/13  8:29 PM      Result Value Range Status   Specimen Description URINE, CLEAN CATCH   Final   Special Requests NONE   Final   Culture  Setup Time     Final   Value:  04/15/2013 02:05     Performed at [facility] Count     Final   Value: >=100,000 COLONIES/ML     Performed at [facility]   Culture     Final   Value: KLEBSIELLA PNEUMONIAE     Performed at [facility]   Report Status 04/16/2013  FINAL   Final   Organism ID, Bacteria KLEBSIELLA PNEUMONIAE   Final  CULTURE, BLOOD (ROUTINE X 2)     Status: None   Collection Time    04/14/13  9:44 PM      Result Value Range Status   Specimen Description BLOOD RIGHT ANTECUBITAL   Final   Special Requests BOTTLES DRAWN AEROBIC AND ANAEROBIC 5CC   Final   Culture  Setup Time     Final   Value: 04/15/2013 02:04     Performed at [facility]   Culture     Final   Value: STREPTOCOCCUS PNEUMONIAE     Note: SUSCEPTIBILITIES PERFORMED ON PREVIOUS CULTURE WITHIN THE LAST 5 DAYS.     Note: Gram Stain Report Called to,Read Back By and Verified With: [facility] WILLARD 04/15/13 1415 BY SMITHERSJ     Performed at [facility]   Report Status 04/17/2013 FINAL   Final  CULTURE, BLOOD (ROUTINE X 2)     Status: None   Collection Time    04/14/13  9:50 PM      Result Value Range Status   Specimen Description BLOOD RIGHT HAND   Final   Special Requests BOTTLES DRAWN AEROBIC AND ANAEROBIC 5CC   Final   Culture  Setup Time     Final   Value: 04/15/2013 02:03     Performed at [facility]   Culture     Final   Value: STREPTOCOCCUS PNEUMONIAE     Note: Gram Stain Report Called to,Read Back By and Verified With: [facility] WILLARD 04/15/13 1415 BY SMITHERSJ     Performed at [facility]   Report Status 04/17/2013 FINAL   Final   Organism ID, Bacteria STREPTOCOCCUS PNEUMONIAE   Final  CULTURE, BLOOD (ROUTINE X 2)     Status: None   Collection Time    04/15/13  2:15 AM      Result Value Range Status   Specimen Description BLOOD RIGHT ANTECUBITAL   Final   Special Requests BOTTLES DRAWN AEROBIC ONLY 10CC   Final   Culture  Setup Time     Final   Value:  04/15/2013 08:55     Performed at [facility]   Culture     Final   Value:        BLOOD CULTURE RECEIVED NO GROWTH TO DATE CULTURE WILL BE HELD FOR 5 DAYS BEFORE ISSUING A FINAL NEGATIVE REPORT     Performed at [facility]   Report Status PENDING   Incomplete  CULTURE, BLOOD (ROUTINE X 2)     Status: None   Collection Time    04/15/13  2:22 AM      Result Value Range Status   Specimen Description BLOOD RIGHT ARM   Final   Special Requests BOTTLES DRAWN AEROBIC AND ANAEROBIC 10CC   Final   Culture  Setup Time     Final   Value: 04/15/2013 08:55     Performed at [facility]   Culture     Final   Value:        BLOOD CULTURE RECEIVED NO GROWTH TO DATE CULTURE WILL BE HELD FOR 5 DAYS BEFORE ISSUING A FINAL NEGATIVE REPORT     Performed at [facility]   Report Status PENDING   Incomplete  CULTURE, BLOOD (SINGLE)     Status: None   Collection Time    04/16/13  5:50 AM      Result  Value Range Status   Specimen Description BLOOD RIGHT ARM   Final   Special Requests BOTTLES DRAWN AEROBIC ONLY 8CC   Final   Culture  Setup Time     Final   Value: 04/16/2013 14:12     Performed at [facility]   Culture     Final   Value:        BLOOD CULTURE RECEIVED NO GROWTH TO DATE CULTURE WILL BE HELD FOR 5 DAYS BEFORE ISSUING A FINAL NEGATIVE REPORT     Performed at [facility]   Report Status PENDING   Incomplete     Studies: No results found.  Scheduled Meds: . amLODipine  10 mg Oral Daily  . aspirin EC  81 mg Oral Daily  . atorvastatin  20 mg Oral QHS  . cefTRIAXone (ROCEPHIN)  IV  2 g Intravenous Q24H  . cholecalciferol  5,000 Units Oral Daily  . ciprofloxacin-dexamethasone  4 drop Right Ear BID  . enoxaparin (LOVENOX) injection  75 mg Subcutaneous Q24H  . fluticasone  2 spray Each Nare Daily  . furosemide  80 mg Intravenous BID  . gabapentin  300 mg Oral BID  . insulin aspart  0-20 Units Subcutaneous Q4H  . insulin detemir  10 Units  Subcutaneous QHS  . labetalol  200 mg Oral BID  . latanoprost  1 drop Right Eye QHS  . loratadine  10 mg Oral Daily  . montelukast  10 mg Oral QHS  . oxymetazoline  1 spray Each Nare BID  . potassium chloride  40 mEq Oral Q4H  . saccharomyces boulardii  250 mg Oral BID  . sodium chloride  3 mL Intravenous Q12H  . tamsulosin  0.4 mg Oral QHS   Continuous Infusions:    Active Problems:   Other and unspecified hyperlipidemia   Morbid obesity   Essential hypertension, benign   CAD, NATIVE VESSEL   SVT/ PSVT/ PAT   Acute on chronic diastolic heart failure   Acute renal failure   Chronic pain syndrome   UTI (lower urinary tract infection)   UTI (urinary tract infection)   Noncompliance   Chronic renal insufficiency   Diastolic CHF   Blindness of left eye   Hypokalemia    Time spent: 30 min    Filemon Breton, [facility]  [facility] Pager [phone]. If 7PM-7AM, please contact night-coverage at [email or web address], password [facility] 04/17/2013, 1:51 PM  LOS: 3 days

## 2013-04-17 NOTE — Progress Notes (Signed)
Clinical Social Work Department BRIEF PSYCHOSOCIAL ASSESSMENT 04/17/2013  Patient:  [name], [name]     Account Number:  [id number]     Admit date:  04/14/2013  Clinical Social Worker:  Hattie Perch  Date/Time:  04/17/2013 12:00 M  Referred by:  Physician  Date Referred:  04/17/2013 Referred for  SNF Placement   Other Referral:   Interview type:  Family Other interview type:    PSYCHOSOCIAL DATA Living Status:  FAMILY Admitted from facility:   Level of care:   Primary support name:  Sallie Jarema Primary support relationship to patient:  SPOUSE Degree of support available:   excellent    CURRENT CONCERNS Current Concerns  Post-Acute Placement   Other Concerns:    SOCIAL WORK ASSESSMENT / PLAN Patient and spouse are both admitted on same floor of [place]. Patient and spouse both need snf. Patient is alert and oriented but wanted to get cleaned up as his family is coming to see him today. CSW spoke with his wife in 77. She states that they will both need rehab and would like to go to same facility.She asked that he be faxed out to same facilities she was.   Assessment/plan status:   Other assessment/ plan:   Information/referral to community resources:    PATIENT'S/FAMILY'S RESPONSE TO PLAN OF CARE: Patient and spouse are understanding that they both need snf. They are realistic about their diagnoses and hopeful that short term snf will progress them well enough to go home.

## 2013-04-18 ENCOUNTER — Encounter (HOSPITAL_COMMUNITY): Payer: Self-pay | Admitting: Physician Assistant

## 2013-04-18 DIAGNOSIS — I10 Essential (primary) hypertension: Secondary | ICD-10-CM

## 2013-04-18 LAB — CBC
HCT: 26.7 % — ABNORMAL LOW (ref 39.0–52.0)
MCHC: 34.8 g/dL (ref 30.0–36.0)
RBC: 2.79 MIL/uL — ABNORMAL LOW (ref 4.22–5.81)
RDW: 14.6 % (ref 11.5–15.5)
WBC: 4.4 10*3/uL (ref 4.0–10.5)

## 2013-04-18 LAB — GLUCOSE, CAPILLARY
Glucose-Capillary: 176 mg/dL — ABNORMAL HIGH (ref 70–99)
Glucose-Capillary: 150 mg/dL — ABNORMAL HIGH (ref 70–99)
Glucose-Capillary: 160 mg/dL — ABNORMAL HIGH (ref 70–99)
Glucose-Capillary: 232 mg/dL — ABNORMAL HIGH (ref 70–99)

## 2013-04-18 LAB — BASIC METABOLIC PANEL
BUN: 33 mg/dL — ABNORMAL HIGH (ref 6–23)
CO2: 25 mEq/L (ref 19–32)
GFR calc Af Amer: 37 mL/min — ABNORMAL LOW (ref >90)
GFR calc non Af Amer: 32 mL/min — ABNORMAL LOW (ref >90)
Potassium: 3.3 mEq/L — ABNORMAL LOW (ref 3.5–5.1)
Sodium: 133 mEq/L — ABNORMAL LOW (ref 135–145)

## 2013-04-18 LAB — MAGNESIUM: Magnesium: 1.9 mg/dL (ref 1.5–2.5)

## 2013-04-18 MED ORDER — VITAMINS A & D EX OINT
TOPICAL_OINTMENT | CUTANEOUS | Status: AC
  Administered 2013-04-18: 23:00:00
  Filled 2013-04-18: qty 5

## 2013-04-18 MED ORDER — INSULIN DETEMIR 100 UNIT/ML ~~LOC~~ SOLN
15.0000 [IU] | Freq: Every day | SUBCUTANEOUS | Status: DC
  Administered 2013-04-18 – 2013-04-21 (×4): 15 [IU] via SUBCUTANEOUS
  Filled 2013-04-18 (×6): qty 0.15

## 2013-04-18 MED ORDER — POTASSIUM CHLORIDE CRYS ER 20 MEQ PO TBCR
60.0000 meq | EXTENDED_RELEASE_TABLET | ORAL | Status: DC
  Administered 2013-04-18 – 2013-04-19 (×6): 60 meq via ORAL
  Filled 2013-04-18 (×12): qty 3

## 2013-04-18 MED ORDER — HYDROCODONE-ACETAMINOPHEN 5-325 MG PO TABS
2.0000 | ORAL_TABLET | ORAL | Status: DC | PRN
  Administered 2013-04-18 – 2013-04-19 (×4): 2 via ORAL
  Filled 2013-04-18 (×4): qty 2

## 2013-04-18 MED ORDER — MAGNESIUM SULFATE 40 MG/ML IJ SOLN
2.0000 g | Freq: Once | INTRAMUSCULAR | Status: AC
  Administered 2013-04-18: 2 g via INTRAVENOUS
  Filled 2013-04-18: qty 50

## 2013-04-18 NOTE — Progress Notes (Signed)
Physical Therapy Treatment Patient Details Name: [name] MRN: [id number] DOB: 08-23-1931 Today's Date: 04/18/2013 Time: 4098-1191 PT Time Calculation (min): 14 min  PT Assessment / Plan / Recommendation  History of Present Illness     PT Comments   Pt ambulated in hallway and then assisted to visit with spouse in her room.  Follow Up Recommendations  SNF     Does the patient have the potential to tolerate intense rehabilitation     Barriers to Discharge        Equipment Recommendations  None recommended by PT    Recommendations for Other Services    Frequency Min 3X/week   Progress towards PT Goals Progress towards PT goals: Progressing toward goals  Plan Current plan remains appropriate    Precautions / Restrictions Precautions Precautions: Fall   Pertinent Vitals/Pain Pt denies dizziness    Mobility  Bed Mobility Details for Bed Mobility Assistance: sitting EOB on arrival using urinal  Transfers Transfers: Sit to Stand;Stand to Sit Sit to Stand: With upper extremity assist;From bed;4: Min guard Stand to Sit: With upper extremity assist;To chair/3-in-1;4: Min assist Details for Transfer Assistance: verbal cues for safe technique, uses rocking technique to stand, slight assist to control descent Ambulation/Gait Ambulation/Gait Assistance: 4: Min guard Ambulation Distance (Feet): 80 Feet Assistive device: Rolling walker Ambulation/Gait Assistance Details: slow pace  Gait Pattern: Step-through pattern;Decreased stride length;Wide base of support General Gait Details: ambulated to spouse's room and recliner brought to her room as well for both pts to visit (RN aware and okay with this)    Exercises     PT Diagnosis:    PT Problem List:   PT Treatment Interventions:     PT Goals (current goals can now be found in the care plan section)    Visit Information  Last PT Received On: 04/18/13 Assistance Needed: +1    Subjective Data      Cognition  Cognition Arousal/Alertness: Awake/alert Behavior During Therapy: [facility] for tasks assessed/performed Overall Cognitive Status: Within Functional Limits for tasks assessed    Balance     End of Session PT - End of Session Activity Tolerance: Patient limited by fatigue Patient left: in chair;with family/visitor present;Other (comment) (pt in spouse's room with son present) Nurse Communication:  (pt in spouse's room)   GP     Iisha Soyars,KATHrine E 04/18/2013, 12:24 PM Zenovia Jarred, PT, DPT 04/18/2013 Pager: [phone]

## 2013-04-18 NOTE — Progress Notes (Signed)
Patient refusing to wear CPAP.  Stated that he is still waiting on his family to bring his home unit in.
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
## 2013-04-18 NOTE — Progress Notes (Signed)
Provided bed offers to family. They have requested [facility].  Angello Chien C. Almarosa Bohac MSW, LCSW [phone]

## 2013-04-18 NOTE — Progress Notes (Signed)
Patient has been active with [facility] Care Management services for CHF disease management. Patient rescheduled most recent home visit with [facility] stating he was too busy. Noted patient likely to go to SNF at discharge. Will continue to follow. Made inpatient RNCM aware [facility] Care Management following.  Raiford Noble, MSN-Ed, RN,BSN- [facility] [phone]

## 2013-04-18 NOTE — Progress Notes (Signed)
[facility] PROGRESS NOTE  [name]:[id number] DOB: Jan 13, 1932 DOA: 04/14/2013 PCP: Catalina Pizza, MD  Assessment/Plan  Bilateral otitis media and possible mastoiditis, continuing to drain pus from the right ear -  Continue ceftriaxone -  Not candidate for PICC due to CrCl, however, may be able to discharge with PIV and once PIV lost, can do IM injections to complete 14 day course IV/IM.  Transition to oral antibiotics after 14 days are complete.   -  Appreciate ENT and ID recommendations -  Spoke with Dr. Lazarus Salines from ENT who recommended 1 month of low dose clindamycin 150mg  TID and 2 weeks of FQ then f/u with ENT in 6 weeks.   -  Increase oral pain medication  Strep pneumo bacteremia sensitive to ceftriaxone, FQ, and PCN -  Continue ceftriaxone day 5 of antibiotics -  Appreciate infectious disease recommendations -  ECHO was technically severely limited -  Blood cultures 2 out of 2 from 12/18: Strep pneumo -  Blood cultures 12/19:  NGTD -  Blood culture 12/20:  NGTD  Dizziness may be related to otitis media/sinusitis and bacteremia.  DDx includes stroke, venous thrombosis -  Consider MRI if not improving with abx -  PT recommending SNF  Sinusitis with purulence draining from right nare -  Continue flonase -  Continue afrin and nasal saline   Acute on chronic diastolic heart failure (dry weight near 329lbs).  Patient has asymmetric edema L>R at baseline although both legs are more swollen recently.  Wt up to 347 lbs.   -  -1.28L yesterday but now weight increased by 6kg  -  Continue lasix 80mg  IV BID -  Metolazone given this morning -  Trend electrolytes -  troponins neg  -  Consider spironolactone to help with potassium retention? -  Not candidate for ACEI given CKD -  Cardiology consultation  HTN/HLD, stable.  Continue norvasc, atorvastatin,   T2DM with peripheral neuropathy, CBG very mildly elevated -  Continue gabapentin for neuropathy -  Trend CBG -   Continue high dose SSI  -  Increase levemir to 15 units  CKD stage 3, baseline creatinine 1.8 -  Minimize nephrotoxins, renally dose medications including gabapentin  Klebsiella UTI, pansensitive -  Continue abx   Hyponatremia likely due to acute CHF exac -  diuresis  Hypokalemia, likely secondary to diuretic use and unchanged despite oral supplements yesterday -  Increase to potassium chloride 60 meq q4h today  -  Repeat potassium level this afternoon -  Keep magnesium near 2:  Magnesium sulfate 2gm IV once  Hearing impairment, may be worsened by OM.     Normocytic anemia, likely anemia of chronic disease and possibly some vitamin B12 deficiency  -  Iron panel consistent with chronic disease -  B12 367, folate 1258 wnl -  TSH wnl -  Start cyanocobalamin daily  Thrombocytopenia, chronic.  Large platelets on smear.  plt decreased dramatically today, no evidence of bleeding.  Rule out mild DIC in setting of infection -  Fibrinogen wnl -  INR wnl -  Start vitamin B12 -  Repeat CBC in AM  Diet:  Healthy heart, 2gm, 1.2L fluid restriction Access:  PIV IVF:  OFF Proph:  Lovenox.  Transition to SCD if plt less than or equal to 50K  Code Status: full Family Communication: patient and his grandson Disposition Plan:  To SNF pending diuretic plan.  Cardiology to assist.  Can patient be discharged with PIV and once PIV  lost, be given IM injections of ceftriaxone to complete two week course?    Consultants:  ENT by phone  ID   Procedures:  CT head  Antibiotics:  Ciprofloxacin x 1  Levofloxacin 12/19 >> 12/20  Vancomycin 12/19 >> 12/20  Ceftriaxone 12/20 >>  HPI/Subjective:  Right ear still draining pus.  Feeling a little better today.  Denies SOB, nausea, vomiting.  Still with severe sinus congestion.  Asking for more pain medication for right ear today  Objective: Filed Vitals:   04/17/13 1500 04/17/13 2019 04/17/13 2113 04/18/13 0508  BP:  148/51   140/47  Pulse:  67  65  Temp:  98.7 F (37.1 C) 98.4 F (36.9 C) 98.3 F (36.8 C)  TempSrc:  Oral Oral Oral  Resp:  20  20  Height:      Weight: 157.353 kg (346 lb 14.4 oz)   157.4 kg (347 lb 0.1 oz)  SpO2:  95%  93%    Intake/Output Summary (Last 24 hours) at 04/18/13 0650 Last data filed at 04/18/13 0511  Gross per 24 hour  Intake    320 ml  Output   2075 ml  Net  -1755 ml   Filed Weights   04/17/13 0403 04/17/13 1500 04/18/13 0508  Weight: 157.4 kg (347 lb 0.1 oz) 157.353 kg (346 lb 14.4 oz) 157.4 kg (347 lb 0.1 oz)    Exam:   General:  [name], No acute distress  HEENT:  NCAT, MMM, purulence still draining from right ear but less copious  Cardiovascular:  RRR, nl S1, S2 no mrg, 2+ pulses, warm extremities  Respiratory:  Diminished bilateral BS with wheeze, no focal rales, rhonchi, no increased WOB  Abdomen:   NABS, soft, NT/ND  MSK:   Normal tone and bulk, 3+ LLE and 2+ RLE edema  Neuro:  Grossly intact  Data Reviewed: Basic Metabolic Panel:  Recent Labs Lab 04/14/13 2050 04/15/13 0215 04/15/13 0802 04/16/13 0550 04/16/13 1536 04/17/13 0632 04/17/13 1426 04/18/13 0505  NA 144 140 141 136 135 137  --  133*  K 2.7* 2.7* 2.8* 2.8* 3.1* 3.4* 3.4* 3.3*  CL 103 102 102 99 97 101  --  96  CO2 26 26 26 26 25 26   --  25  GLUCOSE 167* 163* 145* 157* 210* 161*  --  196*  BUN 30* 32* 35* 36* 36* 34*  --  33*  CREATININE 1.60* 1.80* 1.97* 2.12* 2.39* 2.07*  --  1.92*  CALCIUM 9.5 8.9 8.9 8.6 8.7 8.6  --  8.7  MG  --  1.9  2.0  --  2.2  --  2.0  --  1.9   Liver Function Tests:  Recent Labs Lab 04/14/13 2050 04/15/13 0215 04/16/13 0550  AST 23 19 21   ALT 11 10 10   ALKPHOS 58 48 46  BILITOT 1.1 1.2 1.0  PROT 6.4 5.7* 5.7*  ALBUMIN 3.7 3.2* 3.0*    Recent Labs Lab 04/14/13 2050  LIPASE 40   No results found for this basename: AMMONIA,  in the last 168 hours CBC:  Recent Labs Lab 04/14/13 2050 04/17/13 0632  WBC 6.9 5.3  NEUTROABS 5.2  3.6  HGB 10.5* 9.5*  HCT 29.3* 27.5*  MCV 92.7 94.5  PLT 86* 65*   Cardiac Enzymes:  Recent Labs Lab 04/15/13 0215 04/15/13 0802 04/15/13 1403  TROPONINI <0.30 <0.30 <0.30   BNP (last 3 results)  Recent Labs  04/14/13 2050  PROBNP 1021.0*   CBG:  Recent Labs Lab 04/17/13 0402 04/17/13 0716 04/17/13 1159 04/17/13 1642 04/17/13 2015  GLUCAP 160* 151* 181* 187* 190*    Recent Results (from the past 240 hour(s))  URINE CULTURE     Status: None   Collection Time    04/14/13  8:29 PM      Result Value Range Status   Specimen Description URINE, CLEAN CATCH   Final   Special Requests NONE   Final   Culture  Setup Time     Final   Value: 04/15/2013 02:05     Performed at [facility] Count     Final   Value: >=100,000 COLONIES/ML     Performed at [facility]   Culture     Final   Value: KLEBSIELLA PNEUMONIAE     Performed at [facility]   Report Status 04/16/2013 FINAL   Final   Organism ID, Bacteria KLEBSIELLA PNEUMONIAE   Final  CULTURE, BLOOD (ROUTINE X 2)     Status: None   Collection Time    04/14/13  9:44 PM      Result Value Range Status   Specimen Description BLOOD RIGHT ANTECUBITAL   Final   Special Requests BOTTLES DRAWN AEROBIC AND ANAEROBIC 5CC   Final   Culture  Setup Time     Final   Value: 04/15/2013 02:04     Performed at [facility]   Culture     Final   Value: STREPTOCOCCUS PNEUMONIAE     Note: SUSCEPTIBILITIES PERFORMED ON PREVIOUS CULTURE WITHIN THE LAST 5 DAYS.     Note: Gram Stain Report Called to,Read Back By and Verified With: [facility] WILLARD 04/15/13 1415 BY SMITHERSJ     Performed at [facility]   Report Status 04/17/2013 FINAL   Final  CULTURE, BLOOD (ROUTINE X 2)     Status: None   Collection Time    04/14/13  9:50 PM      Result Value Range Status   Specimen Description BLOOD RIGHT HAND   Final   Special Requests BOTTLES DRAWN AEROBIC AND ANAEROBIC 5CC   Final   Culture   Setup Time     Final   Value: 04/15/2013 02:03     Performed at [facility]   Culture     Final   Value: STREPTOCOCCUS PNEUMONIAE     Note: Gram Stain Report Called to,Read Back By and Verified With: [facility] WILLARD 04/15/13 1415 BY SMITHERSJ     Performed at [facility]   Report Status 04/17/2013 FINAL   Final   Organism ID, Bacteria STREPTOCOCCUS PNEUMONIAE   Final  CULTURE, BLOOD (ROUTINE X 2)     Status: None   Collection Time    04/15/13  2:15 AM      Result Value Range Status   Specimen Description BLOOD RIGHT ANTECUBITAL   Final   Special Requests BOTTLES DRAWN AEROBIC ONLY 10CC   Final   Culture  Setup Time     Final   Value: 04/15/2013 08:55     Performed at [facility]   Culture     Final   Value:        BLOOD CULTURE RECEIVED NO GROWTH TO DATE CULTURE WILL BE HELD FOR 5 DAYS BEFORE ISSUING A FINAL NEGATIVE REPORT     Performed at [facility]   Report Status PENDING   Incomplete  CULTURE, BLOOD (ROUTINE X 2)  Status: None   Collection Time    04/15/13  2:22 AM      Result Value Range Status   Specimen Description BLOOD RIGHT ARM   Final   Special Requests BOTTLES DRAWN AEROBIC AND ANAEROBIC 10CC   Final   Culture  Setup Time     Final   Value: 04/15/2013 08:55     Performed at [facility]   Culture     Final   Value:        BLOOD CULTURE RECEIVED NO GROWTH TO DATE CULTURE WILL BE HELD FOR 5 DAYS BEFORE ISSUING A FINAL NEGATIVE REPORT     Performed at [facility]   Report Status PENDING   Incomplete  CULTURE, BLOOD (SINGLE)     Status: None   Collection Time    04/16/13  5:50 AM      Result Value Range Status   Specimen Description BLOOD RIGHT ARM   Final   Special Requests BOTTLES DRAWN AEROBIC ONLY 8CC   Final   Culture  Setup Time     Final   Value: 04/16/2013 14:12     Performed at [facility]   Culture     Final   Value:        BLOOD CULTURE RECEIVED NO GROWTH TO DATE CULTURE WILL BE HELD FOR 5  DAYS BEFORE ISSUING A FINAL NEGATIVE REPORT     Performed at [facility]   Report Status PENDING   Incomplete     Studies: No results found.  Scheduled Meds: . amLODipine  10 mg Oral Daily  . aspirin EC  81 mg Oral Daily  . atorvastatin  20 mg Oral QHS  . cefTRIAXone (ROCEPHIN)  IV  2 g Intravenous Q24H  . cholecalciferol  5,000 Units Oral Daily  . ciprofloxacin-dexamethasone  4 drop Right Ear BID  . enoxaparin (LOVENOX) injection  75 mg Subcutaneous Q24H  . fluticasone  2 spray Each Nare Daily  . furosemide  80 mg Intravenous BID  . gabapentin  300 mg Oral BID  . insulin aspart  0-20 Units Subcutaneous TID WC  . insulin aspart  0-5 Units Subcutaneous QHS  . insulin detemir  10 Units Subcutaneous QHS  . labetalol  200 mg Oral BID  . latanoprost  1 drop Right Eye QHS  . loratadine  10 mg Oral Daily  . metolazone  2.5 mg Oral Q48H  . montelukast  10 mg Oral QHS  . potassium chloride  40 mEq Oral Q4H  . saccharomyces boulardii  250 mg Oral BID  . sodium chloride  3 mL Intravenous Q12H  . tamsulosin  0.4 mg Oral QHS  . vitamin B-12  1,000 mcg Oral Daily   Continuous Infusions:    Active Problems:   Other and unspecified hyperlipidemia   Morbid obesity   Essential hypertension, benign   CAD, NATIVE VESSEL   SVT/ PSVT/ PAT   Acute on chronic diastolic heart failure   Acute renal failure   Chronic pain syndrome   UTI (lower urinary tract infection)   UTI (urinary tract infection)   Noncompliance   Chronic renal insufficiency   Diastolic CHF   Blindness of left eye   Hypokalemia    Time spent: 30 min    Janautica Netzley, [facility]  [facility] Pager [phone]. If 7PM-7AM, please contact night-coverage at [email or web address], password [facility] 04/18/2013, 6:50 AM  LOS: 4 days

## 2013-04-19 ENCOUNTER — Ambulatory Visit (HOSPITAL_COMMUNITY): Payer: Medicare Other | Admitting: Specialist

## 2013-04-19 DIAGNOSIS — A491 Streptococcal infection, unspecified site: Secondary | ICD-10-CM

## 2013-04-19 DIAGNOSIS — A419 Sepsis, unspecified organism: Secondary | ICD-10-CM

## 2013-04-19 LAB — GLUCOSE, CAPILLARY: Glucose-Capillary: 157 mg/dL — ABNORMAL HIGH (ref 70–99)

## 2013-04-19 LAB — CBC
Hemoglobin: 10 g/dL — ABNORMAL LOW (ref 13.0–17.0)
MCH: 32.4 pg (ref 26.0–34.0)
Platelets: 91 10*3/uL — ABNORMAL LOW (ref 150–400)
RBC: 3.09 MIL/uL — ABNORMAL LOW (ref 4.22–5.81)
RDW: 14.5 % (ref 11.5–15.5)
WBC: 4.9 10*3/uL (ref 4.0–10.5)

## 2013-04-19 LAB — RENAL FUNCTION PANEL
CO2: 26 mEq/L (ref 19–32)
Calcium: 9.2 mg/dL (ref 8.4–10.5)
GFR calc Af Amer: 42 mL/min — ABNORMAL LOW (ref >90)
GFR calc non Af Amer: 36 mL/min — ABNORMAL LOW (ref >90)
Glucose, Bld: 128 mg/dL — ABNORMAL HIGH (ref 70–99)
Potassium: 4.4 mEq/L (ref 3.5–5.1)
Sodium: 138 mEq/L (ref 135–145)

## 2013-04-19 LAB — MAGNESIUM: Magnesium: 2.2 mg/dL (ref 1.5–2.5)

## 2013-04-19 MED ORDER — SACCHAROMYCES BOULARDII 250 MG PO CAPS: 250.0000 mg | ORAL_CAPSULE | Freq: Two times a day (BID) | ORAL | Status: DC

## 2013-04-19 MED ORDER — POTASSIUM CHLORIDE CRYS ER 20 MEQ PO TBCR
40.0000 meq | EXTENDED_RELEASE_TABLET | Freq: Every day | ORAL | Status: DC
  Administered 2013-04-19 – 2013-04-22 (×4): 40 meq via ORAL
  Filled 2013-04-19 (×4): qty 2

## 2013-04-19 MED ORDER — POTASSIUM CHLORIDE CRYS ER 20 MEQ PO TBCR: 40.0000 meq | EXTENDED_RELEASE_TABLET | Freq: Every day | ORAL | Status: DC

## 2013-04-19 MED ORDER — CYANOCOBALAMIN 1000 MCG PO TABS: 1000.0000 ug | ORAL_TABLET | Freq: Every day | ORAL | Status: AC

## 2013-04-19 MED ORDER — INSULIN ASPART 100 UNIT/ML ~~LOC~~ SOLN: 0.0000 [IU] | Freq: Three times a day (TID) | SUBCUTANEOUS | Status: DC

## 2013-04-19 MED ORDER — SALINE SPRAY 0.65 % NA SOLN: 1.0000 | NASAL | Status: AC | PRN

## 2013-04-19 MED ORDER — INSULIN DETEMIR 100 UNIT/ML ~~LOC~~ SOLN: 15.0000 [IU] | Freq: Every day | SUBCUTANEOUS | Status: DC

## 2013-04-19 MED ORDER — CEFTRIAXONE SODIUM 1 G IJ SOLR: 2.0000 g | Freq: Every day | INTRAMUSCULAR | Status: DC

## 2013-04-19 MED ORDER — HYDROCODONE-ACETAMINOPHEN 5-325 MG PO TABS: 1.0000 | ORAL_TABLET | Freq: Four times a day (QID) | ORAL | Status: DC | PRN

## 2013-04-19 MED ORDER — METOLAZONE 2.5 MG PO TABS: 2.5000 mg | ORAL_TABLET | ORAL | Status: DC

## 2013-04-19 MED ORDER — CIPROFLOXACIN-DEXAMETHASONE 0.3-0.1 % OT SUSP: 4.0000 [drp] | Freq: Two times a day (BID) | OTIC | Status: DC

## 2013-04-19 MED ORDER — ATORVASTATIN CALCIUM 20 MG PO TABS: 20.0000 mg | ORAL_TABLET | Freq: Every day | ORAL | Status: DC

## 2013-04-19 MED ORDER — ACETAMINOPHEN 325 MG PO TABS: 650.0000 mg | ORAL_TABLET | Freq: Four times a day (QID) | ORAL | Status: DC | PRN

## 2013-04-19 NOTE — Progress Notes (Signed)
Spoke with patient at bedside to discuss [facility] Care Management services. Reinforced with patient the importance of allowing [facility] Care Coordinator to engage him at home and not to please cancel appointments with [facility] as she is trying to assist in managing his heart failure at home. Mr Dragoo changed the subject while speaking with him regarding compliance and [facility] Care Management services. He reports he plans on going to SNF for short term. Made him aware that [facility] Coordinator will follow up post SNF discharge. Emphasized the importance of him allowing her to help him.  Raiford Noble, MSN-Ed, RN, BSN- [facility] [phone]

## 2013-04-19 NOTE — Progress Notes (Signed)
PROGRESS NOTE  Subjective:   79 y.o. male with a history of morbid obesity, chronic diastolic heart failure EF 50% (previous EF 60-65%), HTN, SVT/PSVT/PAT, non obstructive CAD, DMII, OSA-CPAP, arthritis, fibromyalgia, and hyperlipidemia who was admitted on 12/18 for fevers, vomiting and dizziness. He was found to have bilateral otitis media with possible mastoiditis, Klebsiella UTI and strep bacteremia. He is feeling much better today. Patient denies SOB except when he walks "long distances" (from the car to the [place]). He denies CP, orthopnea or PND. He says he always has lower extremity edema but never quite this bad. He is unsure how his weight has increased so much in the hospital. He will not wear his CPAP here because he cannot tolerate the "gurgling". His son is going to see if his daughter will bring his home CPAP to the hospital.   He diuresed > 5 liters overnight on lasix and metalazone.   Bmp is not back yet   Objective:    Vital Signs:   Temp:  [97.5 F (36.4 C)-98.2 F (36.8 C)] 97.5 F (36.4 C) (12/23 0426) Pulse Rate:  [61-66] 61 (12/23 0426) Resp:  [20] 20 (12/23 0426) BP: (116-176)/(53-92) 116/78 mmHg (12/23 0426) SpO2:  [95 %-98 %] 98 % (12/23 0426) Weight:  [338 lb 6.5 oz (153.5 kg)] 338 lb 6.5 oz (153.5 kg) (12/23 0430)  Last BM Date: 04/17/13   24-hour weight change: Weight change: -8 lb 7.9 oz (-3.853 kg)  Weight trends: Filed Weights   04/17/13 1500 04/18/13 0508 04/19/13 0430  Weight: 346 lb 14.4 oz (157.353 kg) 347 lb 0.1 oz (157.4 kg) 338 lb 6.5 oz (153.5 kg)    Intake/Output:  12/22 0701 - 12/23 0700 In: 440 [P.O.:340; IV Piggyback:100] Out: 5660 [Urine:5660] Total I/O In: 340 [P.O.:340] Out: 1675 [Urine:1675]   Physical Exam: BP 116/78  Pulse 61  Temp(Src) 97.5 F (36.4 C) (Oral)  Resp 20  Ht 6' (1.829 m)  Wt 338 lb 6.5 oz (153.5 kg)  BMI 45.89 kg/m2  SpO2 98%  General: Vital signs reviewed and noted.  Very hard of hearing    Head: Normocephalic, atraumatic.  Eyes: Left eye is blind  Throat: normal  Neck:  thick, difficult to assess JVP  Lungs:   clear  Heart:  RR  Abdomen:  Soft, non-tender, obese  Extremities: Marked chronic stasis changes , trace -1+ edema in right leg.  Left leg has significant lymphedema  Neurologic: A&O X3, CN II - XII are grossly intact except for hearing  Psych: Normal     Labs: BMET:  Recent Labs  04/17/13 0632 04/17/13 1426 04/18/13 0505 04/19/13 0509  NA 137  --  133*  --   K 3.4* 3.4* 3.3*  --   CL 101  --  96  --   CO2 26  --  25  --   GLUCOSE 161*  --  196*  --   BUN 34*  --  33*  --   CREATININE 2.07*  --  1.92*  --   CALCIUM 8.6  --  8.7  --   MG 2.0  --  1.9 2.2    Liver function tests: No results found for this basename: AST, ALT, ALKPHOS, BILITOT, PROT, ALBUMIN,  in the last 72 hours No results found for this basename: LIPASE, AMYLASE,  in the last 72 hours  CBC:  Recent Labs  04/17/13 0632 04/18/13 1032 04/19/13 0509  WBC 5.3 4.4 4.9  NEUTROABS 3.6  --   --  HGB 9.5* 9.3* 10.0*  HCT 27.5* 26.7* 29.1*  MCV 94.5 95.7 94.2  PLT 65* 69* 91*    Cardiac Enzymes: No results found for this basename: CKTOTAL, CKMB, TROPONINI,  in the last 72 hours  Coagulation Studies:  Recent Labs  04/17/13 0845  LABPROT 14.5  INR 1.15    Other: No components found with this basename: POCBNP,  No results found for this basename: DDIMER,  in the last 72 hours No results found for this basename: HGBA1C,  in the last 72 hours No results found for this basename: CHOL, HDL, LDLCALC, TRIG, CHOLHDL,  in the last 72 hours No results found for this basename: TSH, T4TOTAL, FREET3, T3FREE, THYROIDAB,  in the last 72 hours No results found for this basename: VITAMINB12, FOLATE, FERRITIN, TIBC, IRON, RETICCTPCT,  in the last 72 hours   Other results:  Tele:  NSR at 60  Medications:    Infusions:    Scheduled Medications: . amLODipine  10 mg Oral Daily  .  aspirin EC  81 mg Oral Daily  . atorvastatin  20 mg Oral QHS  . cefTRIAXone (ROCEPHIN)  IV  2 g Intravenous Q24H  . cholecalciferol  5,000 Units Oral Daily  . ciprofloxacin-dexamethasone  4 drop Right Ear BID  . enoxaparin (LOVENOX) injection  75 mg Subcutaneous Q24H  . fluticasone  2 spray Each Nare Daily  . furosemide  80 mg Intravenous BID  . gabapentin  300 mg Oral BID  . insulin aspart  0-20 Units Subcutaneous TID WC  . insulin aspart  0-5 Units Subcutaneous QHS  . insulin detemir  15 Units Subcutaneous QHS  . labetalol  200 mg Oral BID  . latanoprost  1 drop Right Eye QHS  . loratadine  10 mg Oral Daily  . metolazone  2.5 mg Oral Q48H  . montelukast  10 mg Oral QHS  . potassium chloride  60 mEq Oral Q4H  . saccharomyces boulardii  250 mg Oral BID  . sodium chloride  3 mL Intravenous Q12H  . tamsulosin  0.4 mg Oral QHS  . vitamin B-12  1,000 mcg Oral Daily    Assessment/ Plan:       Other and unspecified hyperlipidemia   Morbid obesity   Essential hypertension, benign   CAD, NATIVE VESSEL   SVT/ PSVT/ PAT   Acute on chronic diastolic heart failure   Acute renal failure   Chronic pain syndrome   UTI (lower urinary tract infection)   UTI (urinary tract infection)   Noncompliance   Chronic renal insufficiency   Diastolic CHF   Blindness of left eye   Hypokalemia  1. Chronic diastolic CHF-  Has diuresed briskly overnight.   Will DC zaroxalyn for now - cont. IV lasix.    Check bmp.    2. Obstructive sleep apnea - needs his CPAP  3. Lymphedema- he needs to elevate his legs.  He has significant left leg lymphedema that will not likely be able to be treated with diuretics along.  He wears compression hose at home.  4. Noncompliance:   This seems to be a significant issue.  Made worse by his deafness.      Disposition:  Length of Stay: 5  Vesta Mixer, Montez Hageman., MD, [facility] 04/19/2013, 6:50 AM Office [phone] Pager [phone]

## 2013-04-19 NOTE — Discharge Summary (Addendum)
Physician Discharge Summary  [name] AVW:[id number] DOB: Jul 06, 1931 DOA: 04/14/2013  PCP: [name] Pizza, MD  Admit date: 04/14/2013 Discharge date: 04/22/2013  Recommendations for Outpatient Follow-up:  1. Continue IV ceftriaxone 2 gm daily through 12/31 2. Start clindamycin 150mg  TID on 04/28/2013 and continue for 2 weeks, then stop.    3. ENT follow up in late January  4. Cardiology follow up within 2 weeks for weight check, heart failure management 5. BMP weekly, results to be sent to PCP for review of creatinine and potassium 6. CBC in 1 week to follow up anemia and thrombocytopenia 7. Please f/u results of pending blood cultures 8. Ongoing PT/OT 9. Weight patient daily and if he gains more than 3-lbs in one day or 5-lbs in one week, may take additional dose of torsemide 20mg  in the afternoon 10. Changed insulin to levemir 15 units with high dose SSI at meals because of low normal blood sugars  Discharge Diagnoses:  Active Problems:   Other and unspecified hyperlipidemia   Morbid obesity   Essential hypertension, benign   CAD, NATIVE VESSEL   SVT/ PSVT/ PAT   Acute on chronic diastolic heart failure   Acute renal failure   Chronic pain syndrome   UTI (lower urinary tract infection)   UTI (urinary tract infection)   Noncompliance   Chronic renal insufficiency   Diastolic CHF   Blindness of left eye   Hypokalemia   Discharge Condition: Stable, improved  Diet recommendation: Healthy heart, 1.2 L fluid restriction, 2 g sodium  Wt Readings from Last 3 Encounters:  04/22/13 146.7 kg (323 lb 6.6 oz)  03/30/13 153.679 kg (338 lb 12.8 oz)  01/26/13 147.873 kg (326 lb)    History of present illness:  [name] is a 79 y.o. WM PMHx Hx left eye blindness secondary to trauma, SVT, CAD, Diastolic heart failure, OSA on CPAP, noncompliance, hyperlipidemia, diabetes Type 2 with peripheral neuropathy, Presented to ED with fever and dizziness and vomiting. He had chronic  vertigo that has been getting worse. Today he was upset because his wife had to go to surgery. He had some potato soup and then was belching and then vomited. He's been having chillsx 3 months w/o fever. However today had measured fever in the EDy. Stable shortness of breath. Legs has been chronically swollen. Had flu shot this year. Per Dr. Arvilla Meres (cardiology) note from 06/23/2012 discharge weight range 370-372 pounds. Patient's current weight is 337 pounds. Negative CP,positive fatigue x2 days, positive chronic SOB. States not weighing himself daily, unsure of what his baseline weight should be.  Hospital Course:   Bilateral otitis media and possible mastoiditis: The patient had severe bilateral ear and sinus pain initially and on the first day of admission he started draining pus and blood out of his right ear.  Because his CT scan demonstrated possible mastoiditis, ENT reviewed his images in his story.  They recommended a one-month course of broad-spectrum antibiotics and follow up in their clinic in approximately 6 weeks.  He was initially started on levofloxacin, but after results of cultures he was transitioned to ceftriaxone at the advice of infectious disease.  He should complete a two-week course followed by 2 additional weeks of low-dose clindamycin.  He may continue Flonase and nasal saline. Continue antibiotic ear drops through December 25, then stop.  Strep pneumonia bacteremia: Source was likely his bilateral otitis media and sinusitis. He was initially started on levofloxacin but after his initial blood cultures demonstrated gram-positive  cocci, vancomycin was added. After his blood cultures speciate it and sensitivities resulted he was transitioned to IV ceftriaxone. Infectious disease was consult. Echocardiogram was technically severely limited, however there is no evidence of vegetation. His blood cultures cleared quickly and blood cultures taken just a few hours after the initial  ones were heard the negative. It is unlikely that he has endocarditis. Infectious disease recommended that he complete a 14 day course of IV versus IM ceftriaxone. They recommend that he start clindamycin and after the initial 2 weeks of IV therapy at low dose to continue for an additional 2 weeks. The patient will followup with ENT in approximately 5 to 6 weeks.  Klebsiella urinary tract infection, pan sensitive. We will be completing a two-week course of ceftriaxone.  Dizziness is likely related to otitis media, sinusitis, and bacteremia. The patient had improvement in his strength and balance after initiation of antibiotics. If his balance problems return, recommended MRI of the brain to determine if he has had a stroke. He was seen by physical therapy who recommended to skilled nursing facility for rehabilitation and ongoing occupational and physical therapy.  Acute and chronic diastolic heart failure: The patient's dry weight is approximately 329 pounds. He presented with asymmetric lower extremity edema left leg larger than right leg. At per the patient and his family his edema is generally asymmetric. His legs have been more swollen than usual. His weight trended up to 347 pounds during this admission, likely secondary to sepsis. His weight trended up despite continuing metolazone and Lasix IV. He probably had some difficulty with diuresis initially because of his low potassium.  After his potassium levels normalized, he underwent brisk diuresis on 12/22 (-5.2L, weight dow to 338lbs). His troponins were negative. His echocardiogram was very limited. He was seen by cardiology who agreed with the current plan of intermittent metolazone and Lasix. The patient will continue IV wheezing CV milligrams IV twice a day on December 23, and if he has diarrhea he states further towards his dry weight of 329 pounds, he may resume his home metolazone and torsemide regimen and be transferred to skilled nursing  facility for ongoing care.  Hyperlipidemia, hypertension, stable. Continue Norvasc and atorvastatin.  Diabetes mellitus type 2 with peripheral neuropathy, CBGs were mildly elevated. He was placed on high-dose sliding-scale insulin and levemir.  He continue gabapentin for his peripheral neuropathy.  He should continue levemir 15 units nightly with high dose SSI with meals at SNF.  Titration of insulin as needed.  F/u with PCP for ongoing diabetes care and management.  CKD stage III, baseline creatinine 1.8. Minimize nephrotoxins and renally dosed medications including gabapentin.  His ankle myosin was also renally dosed. He had a mild elevation in his creatinine which was likely secondary to his vancomycin and to his diastolic heart failure exacerbation. His creatinine trended down with diuresis and cessation of his vancomycin.  Hyponatremia was likely secondary to acute heart failure exacerbation and improved with diuresis.  Hypokalemia was likely secondary to diuretic use in the in adequate oral supplementation, likely noncompliance. He was started on IV and oral potassium supplementation and eventually increased to potassium chloride 60 mEq every 4 hours for several days before his potassium levels normalized.  Transitioning today 2 potassium 40 mEq once daily, however this dose may need to be adjusted after the results of his electrolytes tomorrow morning.  Anemia of chronic disease and possibly vitamin B12 deficiency. His iron panel was consistent with chronic disease, vitamin B12 level  367, folate 1258 . His TSH was within normal limits and he was started on vitamin B12 thousand micrograms daily.  Thrombocytopenia, chronic. He had large platelets on his smear. His platelet count dropped to a nadir of 65 on December 21. He did not have schistocytes on smear and his fibrinogen remained normal. He may have had some transient thrombocytopenia secondary to his sepsis. He did not have evidence of  ongoing blood loss. His platelet count has trended up to greater than 100 and recommend repeating in approximately one week.  Recommend hematology followup, primary care doctor to refer.  Hard of hearing  Consultants:  ENT by phone  ID, Dr. Ninetta Lights Cardiology, Dr. Jens Som, Dr. Elease Hashimoto Procedures:  CT head Antibiotics:  Ciprofloxacin x 1  Levofloxacin 12/19 >> 12/20  Vancomycin 12/19 >> 12/20  Ceftriaxone 12/20 >> 12/31   Filed Vitals:   04/22/13 0600  BP: 119/86  Pulse: 68  Temp: 98.1 F (36.7 C)  Resp: 18   Filed Vitals:   04/21/13 1405 04/21/13 2114 04/21/13 2247 04/22/13 0600  BP: 121/37 122/80 144/78 119/86  Pulse: 62 64 65 68  Temp: 97.2 F (36.2 C) 97.5 F (36.4 C)  98.1 F (36.7 C)  TempSrc: Oral Oral  Oral  Resp: 20 18  18   Height:      Weight:    146.7 kg (323 lb 6.6 oz)  SpO2: 97% 96%  98%    General: [name], No acute distress  HEENT: NCAT, MMM, purulence still draining from right ear but less copious  Cardiovascular: RRR, nl S1, S2 no mrg, 2+ pulses, warm extremities  Respiratory: Diminished bilateral BS, less wheeze today, no focal rales, rhonchi, no increased WOB  Abdomen: NABS, soft, NT/ND  MSK: Normal tone and bulk, 2+ LLE and 1+ RLE edema  Neuro: Grossly intact   Discharge Instructions      Discharge Orders   Future Appointments Provider Department Dept Phone   06/15/2013 2:00 PM Jonelle Sidle, MD [facility] [phone]   Future Orders Complete By Expires   Diet - low sodium heart healthy  As directed    Discharge instructions  As directed    Comments:     Follow up with PCP , ENT and cardiology as recommended .       Medication List    STOP taking these medications       insulin glargine 100 UNIT/ML injection  Commonly known as:  LANTUS     simvastatin 40 MG tablet  Commonly known as:  ZOCOR      TAKE these medications       acetaminophen 325 MG tablet  Commonly known as:  TYLENOL  Take  2 tablets (650 mg total) by mouth every 6 (six) hours as needed for mild pain, moderate pain or headache.     amLODipine 10 MG tablet  Commonly known as:  NORVASC  Take 10 mg by mouth daily.     aspirin 81 MG chewable tablet  Chew 2 tablets (162 mg total) by mouth daily.     cetirizine 10 MG tablet  Commonly known as:  ZYRTEC  Take 10 mg by mouth every evening.     ciprofloxacin-dexamethasone otic suspension  Commonly known as:  CIPRODEX  Place 4 drops into the right ear 2 (two) times daily.     clindamycin 150 MG capsule  Commonly known as:  CLEOCIN  Take 1 capsule (150 mg total) by mouth 3 (three) times daily.  cyanocobalamin 1000 MCG tablet  Take 1 tablet (1,000 mcg total) by mouth daily.     D-3-5 5000 UNITS capsule  Generic drug:  Cholecalciferol  Take 5,000 Units by mouth daily.     dextrose 5 % SOLN 50 mL with cefTRIAXone 2 G SOLR 2 g  Inject 2 g into the vein daily.  Start taking on:  04/23/2013     fluticasone 50 MCG/ACT nasal spray  Commonly known as:  FLONASE  Place 2 sprays into the nose daily.     gabapentin 300 MG capsule  Commonly known as:  NEURONTIN  Take 1 capsule (300 mg total) by mouth 2 (two) times daily.     HYDROcodone-acetaminophen 5-325 MG per tablet  Commonly known as:  NORCO/VICODIN  Take 1-2 tablets by mouth every 6 (six) hours as needed for moderate pain.     insulin aspart 100 UNIT/ML injection  Commonly known as:  novoLOG  Inject 0-20 Units into the skin 3 (three) times daily with meals.     insulin detemir 100 UNIT/ML injection  Commonly known as:  LEVEMIR  Inject 0.15 mLs (15 Units total) into the skin at bedtime.     labetalol 200 MG tablet  Commonly known as:  NORMODYNE  Take 1 tablet (200 mg total) by mouth 2 (two) times daily.     latanoprost 0.005 % ophthalmic solution  Commonly known as:  XALATAN  Place 1 drop into the right eye at bedtime.     lidocaine 5 %  Commonly known as:  LIDODERM  Place 1 patch onto the  skin daily as needed (for pain). Remove & Discard patch within 12 hours or as directed by MD.     metolazone 2.5 MG tablet  Commonly known as:  ZAROXOLYN  Take 1 tablet (2.5 mg total) by mouth 2 (two) times a week. Sunday and Wed     montelukast 10 MG tablet  Commonly known as:  SINGULAIR  Take 10 mg by mouth at bedtime.     potassium chloride SA 20 MEQ tablet  Commonly known as:  K-DUR,KLOR-CON  Take 2 tablets (40 mEq total) by mouth daily.     saccharomyces boulardii 250 MG capsule  Commonly known as:  FLORASTOR  Take 1 capsule (250 mg total) by mouth 2 (two) times daily.     sodium chloride 0.65 % Soln nasal spray  Commonly known as:  OCEAN  Place 1 spray into both nostrils as needed for congestion.     spironolactone 25 MG tablet  Commonly known as:  ALDACTONE  Take 1 tablet (25 mg total) by mouth daily.     tamsulosin 0.4 MG Caps capsule  Commonly known as:  FLOMAX  Take 0.4 mg by mouth at bedtime.     torsemide 20 MG tablet  Commonly known as:  DEMADEX  Take 60 mg daily. If home weight greater than 329 lbs take an extra 20 mg.     traMADol 50 MG tablet  Commonly known as:  ULTRAM  Take 1 tablet (50 mg total) by mouth every 6 (six) hours as needed for severe pain.       Follow-up Information   Follow up with [name] Pizza, MD. Schedule an appointment as soon as possible for a visit in 1 month.   Specialty:  Internal Medicine   Contact information:    [address]  [place] [address] [phone]       Follow up with Nona Dell, MD. Schedule an  appointment as soon as possible for a visit in 2 weeks.   Specialty:  Cardiology   Contact information:   [address] [place] [address] [phone]       Follow up with BATES, DWIGHT, MD. Schedule an appointment as soon as possible for a visit in 5 weeks.   Specialty:  Otolaryngology   Contact information:   [address] [place] [address] [phone]       The results  of significant diagnostics from this hospitalization (including imaging, microbiology, ancillary and laboratory) are listed below for reference.    Significant Diagnostic Studies: Dg Chest 2 View  04/14/2013   CLINICAL DATA:  Fever, emesis, history diabetes  EXAM: CHEST  2 VIEW  COMPARISON:  06/24/2012  FINDINGS: Enlargement of cardiac silhouette with pulmonary vascular congestion.  Atherosclerotic calcification aorta.  Minimal bibasilar atelectasis.  No acute failure or consolidation.  No pleural effusion or pneumothorax.  Scattered endplate spur formation thoracic spine.  IMPRESSION: Enlargement of cardiac silhouette with pulmonary vascular congestion.  Bibasilar atelectasis.   Electronically Signed   By: Ulyses Southward M.D.   On: 04/14/2013 21:32   Ct Head Wo Contrast  04/14/2013   CLINICAL DATA:  Dizziness  EXAM: CT HEAD WITHOUT CONTRAST  TECHNIQUE: Contiguous axial images were obtained from the base of the skull through the vertex without intravenous contrast.  COMPARISON:  06/24/2006  FINDINGS: Skull and Sinuses:Bilateral mastoid and middle ear opacification, present on the right in 2008. Patchy mucosal thickening in the bilateral paranasal sinuses, most severe in the left maxillary antrum, right anterior ethmoids, and right frontal sinus. Some of the contents is high-density, compatible with inspissated secretions or fungal colonization. Linear foreign body in the left maxillary antrum is likely a displaced plate for treatment of remote blowout fracture of the left orbital floor - also seen in 2008. There is also a remote blowout fracture of the medial wall left orbit.  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 Right proptosis, without retro-orbital mass or inflammation.  Brain: No evidence of acute abnormality, such as acute infarction, hemorrhage, hydrocephalus, or mass lesion/mass effect. There is atrophy and mild chronic small vessel ischemic white matter changes.  IMPRESSION: 1. No evidence of acute  intracranial disease. 2. Bilateral mastoid and middle ear opacification. Consider mastoiditis as a cause of the patient's dizziness. 3. Multifocal inflammatory paranasal sinus disease. 4. Remote trauma to the left orbit, discussed above.   Electronically Signed   By: Tiburcio Pea M.D.   On: 04/14/2013 21:51    Microbiology: Recent Results (from the past 240 hour(s))  URINE CULTURE     Status: None   Collection Time    04/14/13  8:29 PM      Result Value Range Status   Specimen Description URINE, CLEAN CATCH   Final   Special Requests NONE   Final   Culture  Setup Time     Final   Value: 04/15/2013 02:05     Performed at [facility] Count     Final   Value: >=100,000 COLONIES/ML     Performed at [facility]   Culture     Final   Value: KLEBSIELLA PNEUMONIAE     Performed at [facility]   Report Status 04/16/2013 FINAL   Final   Organism ID, Bacteria KLEBSIELLA PNEUMONIAE   Final  CULTURE, BLOOD (ROUTINE X 2)     Status: None   Collection Time  04/14/13  9:44 PM      Result Value Range Status   Specimen Description BLOOD RIGHT ANTECUBITAL   Final   Special Requests BOTTLES DRAWN AEROBIC AND ANAEROBIC 5CC   Final   Culture  Setup Time     Final   Value: 04/15/2013 02:04     Performed at [facility]   Culture     Final   Value: STREPTOCOCCUS PNEUMONIAE     Note: SUSCEPTIBILITIES PERFORMED ON PREVIOUS CULTURE WITHIN THE LAST 5 DAYS.     Note: Gram Stain Report Called to,Read Back By and Verified With: [facility] WILLARD 04/15/13 1415 BY SMITHERSJ     Performed at [facility]   Report Status 04/17/2013 FINAL   Final  CULTURE, BLOOD (ROUTINE X 2)     Status: None   Collection Time    04/14/13  9:50 PM      Result Value Range Status   Specimen Description BLOOD RIGHT HAND   Final   Special Requests BOTTLES DRAWN AEROBIC AND ANAEROBIC 5CC   Final   Culture  Setup Time     Final   Value: 04/15/2013 02:03     Performed at [facility]   Culture     Final   Value: STREPTOCOCCUS PNEUMONIAE     Note: Gram Stain Report Called to,Read Back By and Verified With: [facility] WILLARD 04/15/13 1415 BY SMITHERSJ     Performed at [facility]   Report Status 04/17/2013 FINAL   Final   Organism ID, Bacteria STREPTOCOCCUS PNEUMONIAE   Final  CULTURE, BLOOD (ROUTINE X 2)     Status: None   Collection Time    04/15/13  2:15 AM      Result Value Range Status   Specimen Description BLOOD RIGHT ANTECUBITAL   Final   Special Requests BOTTLES DRAWN AEROBIC ONLY 10CC   Final   Culture  Setup Time     Final   Value: 04/15/2013 08:55     Performed at [facility]   Culture     Final   Value: NO GROWTH 5 DAYS     Performed at [facility]   Report Status 04/21/2013 FINAL   Final  CULTURE, BLOOD (ROUTINE X 2)     Status: None   Collection Time    04/15/13  2:22 AM      Result Value Range Status   Specimen Description BLOOD RIGHT ARM   Final   Special Requests BOTTLES DRAWN AEROBIC AND ANAEROBIC 10CC   Final   Culture  Setup Time     Final   Value: 04/15/2013 08:55     Performed at [facility]   Culture     Final   Value: NO GROWTH 5 DAYS     Performed at [facility]   Report Status 04/21/2013 FINAL   Final  CULTURE, BLOOD (SINGLE)     Status: None   Collection Time    04/16/13  5:50 AM      Result Value Range Status   Specimen Description BLOOD RIGHT ARM   Final   Special Requests BOTTLES DRAWN AEROBIC ONLY 8CC   Final   Culture  Setup Time     Final   Value: 04/16/2013 14:12     Performed at [facility]   Culture     Final   Value: NO GROWTH 5 DAYS     Performed at [facility]   Report  Status 04/22/2013 FINAL   Final     Labs: Basic Metabolic Panel:  Recent Labs Lab 04/18/13 0505 04/19/13 0509 04/20/13 0513 04/21/13 0435 04/22/13 0510  NA 133* 138 139 140 139  K 3.3* 4.4 3.8 3.7 3.5  CL 96 100 100 100 100  CO2 25 26 29 25 29   GLUCOSE 196* 128*  113* 124* 113*  BUN 33* 31* 30* 30* 28*  CREATININE 1.92* 1.71* 1.68* 1.61* 1.54*  CALCIUM 8.7 9.2 9.3 9.2 9.4  MG 1.9 2.2 2.1 2.0 2.0  PHOS  --  3.5 4.1 4.4 4.0   Liver Function Tests:  Recent Labs Lab 04/16/13 0550 04/19/13 0509 04/20/13 0513 04/21/13 0435 04/22/13 0510  AST 21  --   --   --   --   ALT 10  --   --   --   --   ALKPHOS 46  --   --   --   --   BILITOT 1.0  --   --   --   --   PROT 5.7*  --   --   --   --   ALBUMIN 3.0* 3.0* 2.9* 3.0* 3.2*   No results found for this basename: LIPASE, AMYLASE,  in the last 168 hours No results found for this basename: AMMONIA,  in the last 168 hours CBC:  Recent Labs Lab 04/17/13 0632 04/18/13 1032 04/19/13 0509 04/20/13 0513 04/21/13 0435 04/22/13 0510  WBC 5.3 4.4 4.9 4.1 3.6* 3.8*  NEUTROABS 3.6  --   --   --   --   --   HGB 9.5* 9.3* 10.0* 9.7* 9.2* 10.4*  HCT 27.5* 26.7* 29.1* 28.1* 27.2* 29.9*  MCV 94.5 95.7 94.2 96.6 95.4 95.8  PLT 65* 69* 91* 99* 88* 107*   Cardiac Enzymes:  Recent Labs Lab 04/15/13 1403 04/22/13 0510  CKTOTAL  --  608*  TROPONINI <0.30  --    BNP: BNP (last 3 results)  Recent Labs  04/14/13 2050  PROBNP 1021.0*   CBG:  Recent Labs Lab 04/21/13 0735 04/21/13 1139 04/21/13 1645 04/21/13 2124 04/22/13 0740  GLUCAP 121* 247* 152* 170* 120*    Time coordinating discharge: 45 minutes  Signed:  Kathlen Mody [facility] 04/22/2013, 11:03 AM

## 2013-04-20 DIAGNOSIS — M79609 Pain in unspecified limb: Secondary | ICD-10-CM

## 2013-04-20 DIAGNOSIS — R609 Edema, unspecified: Secondary | ICD-10-CM

## 2013-04-20 LAB — GLUCOSE, CAPILLARY
Glucose-Capillary: 106 mg/dL — ABNORMAL HIGH (ref 70–99)
Glucose-Capillary: 183 mg/dL — ABNORMAL HIGH (ref 70–99)
Glucose-Capillary: 152 mg/dL — ABNORMAL HIGH (ref 70–99)

## 2013-04-20 LAB — RENAL FUNCTION PANEL
BUN: 30 mg/dL — ABNORMAL HIGH (ref 6–23)
CO2: 29 mEq/L (ref 19–32)
Calcium: 9.3 mg/dL (ref 8.4–10.5)
Chloride: 100 mEq/L (ref 96–112)
Creatinine, Ser: 1.68 mg/dL — ABNORMAL HIGH (ref 0.50–1.35)
GFR calc Af Amer: 43 mL/min — ABNORMAL LOW (ref >90)
GFR calc non Af Amer: 37 mL/min — ABNORMAL LOW (ref >90)
Phosphorus: 4.1 mg/dL (ref 2.3–4.6)

## 2013-04-20 LAB — CBC
HCT: 28.1 % — ABNORMAL LOW (ref 39.0–52.0)
MCH: 33.3 pg (ref 26.0–34.0)
MCHC: 34.5 g/dL (ref 30.0–36.0)
MCV: 96.6 fL (ref 78.0–100.0)
Platelets: 99 10*3/uL — ABNORMAL LOW (ref 150–400)
RBC: 2.91 MIL/uL — ABNORMAL LOW (ref 4.22–5.81)
RDW: 14.5 % (ref 11.5–15.5)
WBC: 4.1 10*3/uL (ref 4.0–10.5)

## 2013-04-20 MED ORDER — TRAMADOL HCL 50 MG PO TABS: 50.0000 mg | ORAL_TABLET | Freq: Four times a day (QID) | ORAL | Status: DC | PRN

## 2013-04-20 MED ORDER — HYDROCODONE-ACETAMINOPHEN 5-325 MG PO TABS
2.0000 | ORAL_TABLET | ORAL | Status: DC | PRN
  Administered 2013-04-20 – 2013-04-21 (×2): 2 via ORAL
  Administered 2013-04-21: 1 via ORAL
  Filled 2013-04-20: qty 2
  Filled 2013-04-20: qty 1
  Filled 2013-04-20: qty 2

## 2013-04-20 NOTE — Progress Notes (Signed)
Physical Therapy Treatment Patient Details Name: [name] MRN: [id number] DOB: [birth year] Today's Date: 04/20/2013 Time: 4098-1191 PT Time Calculation (min): 14 min  PT Assessment / Plan / Recommendation  History of Present Illness 79 y.o. 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 He had chronic vertigo that has been getting worse. Today he was upset because his wife had to go to surgery. He had some potato soup and then was belching and then vomited. He's been having chillsx 3 months w/o fever. However today had measured fever in the EDy. Stable shortness of breath. Legs has been chronically swollen. Had flu shot this year. Per Dr. Arvilla Meres (cardiology) note from 06/23/2012 discharge weight range 370-372 pounds. Patient's current weight is 337 pounds.    PT Comments   Pt will benefit from PT to address deficits below;   Follow Up Recommendations  SNF     Does the patient have the potential to tolerate intense rehabilitation     Barriers to Discharge        Equipment Recommendations  None recommended by PT    Recommendations for Other Services    Frequency Min 3X/week   Progress towards PT Goals Progress towards PT goals: Progressing toward goals  Plan Current plan remains appropriate    Precautions / Restrictions Precautions Precautions: Fall   Pertinent Vitals/Pain     Mobility  Bed Mobility Bed Mobility: Supine to Sit Supine to Sit: With rails;5: Supervision Details for Bed Mobility Assistance: incr time  Transfers Transfers: Sit to Stand;Stand to Sit Sit to Stand: With upper extremity assist;From bed;4: Min guard Stand to Sit: With upper extremity assist;To chair/3-in-1;4: Min assist Details for Transfer Assistance: verbal cues for safe technique, uses rocking technique to  stand, slight assist to control descent Ambulation/Gait Ambulation/Gait Assistance: 4: Min guard Ambulation Distance (Feet): 100 Feet Assistive device: Rolling walker Ambulation/Gait Assistance Details: cuesf or posture Gait Pattern: Step-through pattern;Decreased stride length;Wide base of support    Exercises     PT Diagnosis:    PT Problem List:   PT Treatment Interventions:     PT Goals (current goals can now be found in the care plan section) Acute Rehab PT Goals Time For Goal Achievement: 04/22/13 Potential to Achieve Goals: Good  Visit Information  Last PT Received On: 04/20/13 Assistance Needed: +1 History of Present Illness: 79 y.o. 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 He had chronic vertigo that has been getting worse. Today he was upset because his wife had to go to surgery. He had some potato soup and then was belching and then vomited. He's been having chillsx 3 months w/o fever. However today had measured fever in the EDy. Stable shortness of breath. Legs has been chronically swollen. Had flu shot this year. Per Dr. Arvilla Meres (cardiology) note from 06/23/2012 discharge weight range 370-372 pounds. Patient's current weight is 337 pounds.     Subjective Data      Cognition  Cognition Arousal/Alertness: Awake/alert Behavior During Therapy: WFL for tasks assessed/performed Overall Cognitive Status: Within Functional Limits for tasks assessed    Balance     End of Session PT - End of Session Equipment Utilized During  Treatment: Gait belt Activity Tolerance: Patient tolerated treatment well Patient left: in chair;with family/visitor present Nurse Communication: Mobility status   GP     [facility] 04/20/2013, 3:58 PM

## 2013-04-20 NOTE — Progress Notes (Signed)
[facility] PROGRESS NOTE  [name] WGN:[id number] DOB: [birth year] DOA: 04/14/2013 PCP: Catalina Pizza, MD  Assessment/Plan  Bilateral otitis media and possible mastoiditis, continuing to drain pus from the right ear  -  Continue ceftriaxone -  Not candidate for PICC due to CrCl, however, may be able to discharge with PIV and once PIV lost, can do IM injections to complete 14 day course IV/IM.  Transition to oral antibiotics after 14 days are complete.   -  Appreciate ENT and ID recommendations -  Spoke with Dr. Lazarus Salines from ENT who recommended 1 month of low dose clindamycin 150mg  TID and 2 weeks of FQ then f/u with ENT in 6 weeks.   -  Increase oral pain medication  Strep pneumo bacteremia sensitive to ceftriaxone, FQ, and PCN -  Continue ceftriaxone day 5 of antibiotics -  Appreciate infectious disease recommendations -  ECHO was technically severely limited -  Blood cultures 2 out of 2 from 12/18: Strep pneumo -  Blood cultures 12/19:  NGTD -  Blood culture 12/20:  NGTD  Dizziness may be related to otitis media/sinusitis and bacteremia.  DDx includes stroke, venous thrombosis -  Consider MRI if not improving with abx -  PT recommending SNF  Sinusitis with purulence draining from right nare -  Continue flonase -  Continue afrin and nasal saline   Acute on chronic diastolic heart failure (dry weight near 329lbs).  Patient has asymmetric edema L>R at baseline although both legs are more swollen recently.  Wt up to 347 lbs.   -  -1.28L yesterday but now weight increased by 6kg  -  Continue lasix 80mg  IV BID -  Metolazone given this morning -  Trend electrolytes -  troponins neg  -  Consider spironolactone to help with potassium retention? -  Not candidate for ACEI given CKD -  Cardiology consultation  HTN/HLD, stable.  Continue norvasc, atorvastatin,   T2DM with peripheral neuropathy, CBG very mildly elevated -  Continue gabapentin for neuropathy -  Trend CBG -   Continue high dose SSI  -  Increase levemir to 15 units  CKD stage 3, baseline creatinine 1.8 -  Minimize nephrotoxins, renally dose medications including gabapentin  Klebsiella UTI, pansensitive -  Continue abx   Hyponatremia likely due to acute CHF exac -  diuresis  Hypokalemia, likely secondary to diuretic use and unchanged despite oral supplements yesterday -  Increase to potassium chloride 60 meq q4h today  -  Repeat potassium level this afternoon -  Keep magnesium near 2:  Magnesium sulfate 2gm IV once  Hearing impairment, may be worsened by OM.     Normocytic anemia, likely anemia of chronic disease and possibly some vitamin B12 deficiency  -  Iron panel consistent with chronic disease -  B12 367, folate 1258 wnl -  TSH wnl -  Start cyanocobalamin daily  Thrombocytopenia, chronic.  Large platelets on smear.  plt decreased dramatically today, no evidence of bleeding.  Rule out mild DIC in setting of infection -  Fibrinogen wnl -  INR wnl -  Start vitamin B12 -  Repeat CBC in AM  Diet:  Healthy heart, 2gm, 1.2L fluid restriction Access:  PIV IVF:  OFF Proph:  Lovenox.  Transition to SCD if plt less than or equal to 50K  Code Status: full Family Communication: none at bedside. Disposition Plan:  To SNF on Friday.  Consultants:  ENT by phone  ID   Procedures:  CT head  Antibiotics:  Ciprofloxacin x 1  Levofloxacin 12/19 >> 12/20  Vancomycin 12/19 >> 12/20  Ceftriaxone 12/20 >>  HPI/Subjective:  Right ear still draining pus.  Feeling a little better today.    Objective: Filed Vitals:   04/19/13 1500 04/19/13 2124 04/20/13 0544 04/20/13 1441  BP: 143/52 149/48 118/65 142/59  Pulse: 62 66 71 60  Temp: 97.7 F (36.5 C) 98.2 F (36.8 C) 98.4 F (36.9 C) 97.6 F (36.4 C)  TempSrc: Oral Oral Oral Oral  Resp: 18 20 18 20   Height:      Weight:   146.6 kg (323 lb 3.1 oz)   SpO2: 96% 99% 97% 90%    Intake/Output Summary (Last 24 hours)  at 04/20/13 1552 Last data filed at 04/20/13 1530  Gross per 24 hour  Intake   1150 ml  Output   2450 ml  Net  -1300 ml   Filed Weights   04/18/13 0508 04/19/13 0430 04/20/13 0544  Weight: 157.4 kg (347 lb 0.1 oz) 153.5 kg (338 lb 6.5 oz) 146.6 kg (323 lb 3.1 oz)    Exam:   General:  [name], No acute distress  HEENT:  NCAT, MMM, purulence still draining from right ear but less copious  Cardiovascular:  RRR, nl S1, S2 no mrg, 2+ pulses, warm extremities  Respiratory:  Diminished bilateral BS with wheeze, no focal rales, rhonchi, no increased WOB  Abdomen:   NABS, soft, NT/ND  MSK:   Normal tone and bulk, 3+ LLE and 2+ RLE edema  Neuro:  Grossly intact  Data Reviewed: Basic Metabolic Panel:  Recent Labs Lab 04/16/13 0550 04/16/13 1536 04/17/13 0632 04/17/13 1426 04/18/13 0505 04/19/13 0509 04/20/13 0513  NA 136 135 137  --  133* 138 139  K 2.8* 3.1* 3.4* 3.4* 3.3* 4.4 3.8  CL 99 97 101  --  96 100 100  CO2 26 25 26   --  25 26 29   GLUCOSE 157* 210* 161*  --  196* 128* 113*  BUN 36* 36* 34*  --  33* 31* 30*  CREATININE 2.12* 2.39* 2.07*  --  1.92* 1.71* 1.68*  CALCIUM 8.6 8.7 8.6  --  8.7 9.2 9.3  MG 2.2  --  2.0  --  1.9 2.2 2.1  PHOS  --   --   --   --   --  3.5 4.1   Liver Function Tests:  Recent Labs Lab 04/14/13 2050 04/15/13 0215 04/16/13 0550 04/19/13 0509 04/20/13 0513  AST 23 19 21   --   --   ALT 11 10 10   --   --   ALKPHOS 58 48 46  --   --   BILITOT 1.1 1.2 1.0  --   --   PROT 6.4 5.7* 5.7*  --   --   ALBUMIN 3.7 3.2* 3.0* 3.0* 2.9*    Recent Labs Lab 04/14/13 2050  LIPASE 40   No results found for this basename: AMMONIA,  in the last 168 hours CBC:  Recent Labs Lab 04/14/13 2050 04/17/13 0632 04/18/13 1032 04/19/13 0509 04/20/13 0513  WBC 6.9 5.3 4.4 4.9 4.1  NEUTROABS 5.2 3.6  --   --   --   HGB 10.5* 9.5* 9.3* 10.0* 9.7*  HCT 29.3* 27.5* 26.7* 29.1* 28.1*  MCV 92.7 94.5 95.7 94.2 96.6  PLT 86* 65* 69* 91* 99*    Cardiac Enzymes:  Recent Labs Lab 04/15/13 0215 04/15/13 0802 04/15/13 1403  TROPONINI <0.30 <0.30 <0.30  BNP (last 3 results)  Recent Labs  04/14/13 2050  PROBNP 1021.0*   CBG:  Recent Labs Lab 04/19/13 1209 04/19/13 1629 04/19/13 2122 04/20/13 0757 04/20/13 1200  GLUCAP 151* 157* 146* 106* 183*    Recent Results (from the past 240 hour(s))  URINE CULTURE     Status: None   Collection Time    04/14/13  8:29 PM      Result Value Range Status   Specimen Description URINE, CLEAN CATCH   Final   Special Requests NONE   Final   Culture  Setup Time     Final   Value: 04/15/2013 02:05     Performed at [facility] Count     Final   Value: >=100,000 COLONIES/ML     Performed at [facility]   Culture     Final   Value: KLEBSIELLA PNEUMONIAE     Performed at [facility]   Report Status 04/16/2013 FINAL   Final   Organism ID, Bacteria KLEBSIELLA PNEUMONIAE   Final  CULTURE, BLOOD (ROUTINE X 2)     Status: None   Collection Time    04/14/13  9:44 PM      Result Value Range Status   Specimen Description BLOOD RIGHT ANTECUBITAL   Final   Special Requests BOTTLES DRAWN AEROBIC AND ANAEROBIC 5CC   Final   Culture  Setup Time     Final   Value: 04/15/2013 02:04     Performed at [facility]   Culture     Final   Value: STREPTOCOCCUS PNEUMONIAE     Note: SUSCEPTIBILITIES PERFORMED ON PREVIOUS CULTURE WITHIN THE LAST 5 DAYS.     Note: Gram Stain Report Called to,Read Back By and Verified With: [facility] WILLARD 04/15/13 1415 BY SMITHERSJ     Performed at [facility]   Report Status 04/17/2013 FINAL   Final  CULTURE, BLOOD (ROUTINE X 2)     Status: None   Collection Time    04/14/13  9:50 PM      Result Value Range Status   Specimen Description BLOOD RIGHT HAND   Final   Special Requests BOTTLES DRAWN AEROBIC AND ANAEROBIC 5CC   Final   Culture  Setup Time     Final   Value: 04/15/2013 02:03     Performed at  [facility]   Culture     Final   Value: STREPTOCOCCUS PNEUMONIAE     Note: Gram Stain Report Called to,Read Back By and Verified With: [facility] WILLARD 04/15/13 1415 BY SMITHERSJ     Performed at [facility]   Report Status 04/17/2013 FINAL   Final   Organism ID, Bacteria STREPTOCOCCUS PNEUMONIAE   Final  CULTURE, BLOOD (ROUTINE X 2)     Status: None   Collection Time    04/15/13  2:15 AM      Result Value Range Status   Specimen Description BLOOD RIGHT ANTECUBITAL   Final   Special Requests BOTTLES DRAWN AEROBIC ONLY 10CC   Final   Culture  Setup Time     Final   Value: 04/15/2013 08:55     Performed at [facility]   Culture     Final   Value:        BLOOD CULTURE RECEIVED NO GROWTH TO DATE CULTURE WILL BE HELD FOR 5 DAYS BEFORE ISSUING A FINAL NEGATIVE REPORT     Performed at [facility]  Report Status PENDING   Incomplete  CULTURE, BLOOD (ROUTINE X 2)     Status: None   Collection Time    04/15/13  2:22 AM      Result Value Range Status   Specimen Description BLOOD RIGHT ARM   Final   Special Requests BOTTLES DRAWN AEROBIC AND ANAEROBIC 10CC   Final   Culture  Setup Time     Final   Value: 04/15/2013 08:55     Performed at [facility]   Culture     Final   Value:        BLOOD CULTURE RECEIVED NO GROWTH TO DATE CULTURE WILL BE HELD FOR 5 DAYS BEFORE ISSUING A FINAL NEGATIVE REPORT     Performed at [facility]   Report Status PENDING   Incomplete  CULTURE, BLOOD (SINGLE)     Status: None   Collection Time    04/16/13  5:50 AM      Result Value Range Status   Specimen Description BLOOD RIGHT ARM   Final   Special Requests BOTTLES DRAWN AEROBIC ONLY 8CC   Final   Culture  Setup Time     Final   Value: 04/16/2013 14:12     Performed at [facility]   Culture     Final   Value:        BLOOD CULTURE RECEIVED NO GROWTH TO DATE CULTURE WILL BE HELD FOR 5 DAYS BEFORE ISSUING A FINAL NEGATIVE REPORT     Performed at  [facility]   Report Status PENDING   Incomplete     Studies: No results found.  Scheduled Meds: . amLODipine  10 mg Oral Daily  . aspirin EC  81 mg Oral Daily  . atorvastatin  20 mg Oral QHS  . cefTRIAXone (ROCEPHIN)  IV  2 g Intravenous Q24H  . cholecalciferol  5,000 Units Oral Daily  . ciprofloxacin-dexamethasone  4 drop Right Ear BID  . enoxaparin (LOVENOX) injection  75 mg Subcutaneous Q24H  . fluticasone  2 spray Each Nare Daily  . furosemide  80 mg Intravenous BID  . gabapentin  300 mg Oral BID  . insulin aspart  0-20 Units Subcutaneous TID WC  . insulin aspart  0-5 Units Subcutaneous QHS  . insulin detemir  15 Units Subcutaneous QHS  . labetalol  200 mg Oral BID  . latanoprost  1 drop Right Eye QHS  . loratadine  10 mg Oral Daily  . montelukast  10 mg Oral QHS  . potassium chloride  40 mEq Oral Daily  . saccharomyces boulardii  250 mg Oral BID  . sodium chloride  3 mL Intravenous Q12H  . tamsulosin  0.4 mg Oral QHS  . vitamin B-12  1,000 mcg Oral Daily   Continuous Infusions:    Active Problems:   Other and unspecified hyperlipidemia   Morbid obesity   Essential hypertension, benign   CAD, NATIVE VESSEL   SVT/ PSVT/ PAT   Acute on chronic diastolic heart failure   Acute renal failure   Chronic pain syndrome   UTI (lower urinary tract infection)   UTI (urinary tract infection)   Noncompliance   Chronic renal insufficiency   Diastolic CHF   Blindness of left eye   Hypokalemia    Time spent: 30 min    [name]  [facility] Pager [phone] If 7PM-7AM, please contact night-coverage at [email or web address], password [facility] 04/20/2013, 3:52 PM  LOS: 6 days

## 2013-04-20 NOTE — Progress Notes (Signed)
Left lower extremity venous duplex completed.  Left:  No obvious evidence of DVT, superficial thrombosis, or Baker's cyst.  Right:  Negative for DVT in the common femoral vein.  Technically difficult study due to the patient's body habitus.  

## 2013-04-20 NOTE — Progress Notes (Addendum)
PROGRESS NOTE  Subjective:   79 y.o. male with a history of morbid obesity, chronic diastolic heart failure EF 50% (previous EF 60-65%), HTN, SVT/PSVT/PAT, non obstructive CAD, DMII, OSA-CPAP, arthritis, fibromyalgia, and hyperlipidemia who was admitted on 12/18 for fevers, vomiting and dizziness. He was found to have bilateral otitis media with possible mastoiditis, Klebsiella UTI and strep bacteremia. He is feeling much better today. Patient denies SOB except when he walks "long distances" (from the car to the [place]). He denies CP, orthopnea or PND. He says he always has lower extremity edema but never quite this bad. He is unsure how his weight has increased so much in the hospital. He will not wear his CPAP here because he cannot tolerate the "gurgling". His son is going to see if his daughter will bring his home CPAP to the hospital.   He diuresed > 5 liters Monday and -2 liters Tuesday night  on lasix and metalazone.       Objective:    Vital Signs:   Temp:  [97.7 F (36.5 C)-98.4 F (36.9 C)] 98.4 F (36.9 C) (12/24 0544) Pulse Rate:  [62-71] 71 (12/24 0544) Resp:  [18-20] 18 (12/24 0544) BP: (118-149)/(48-65) 118/65 mmHg (12/24 0544) SpO2:  [96 %-99 %] 97 % (12/24 0544) Weight:  [323 lb 3.1 oz (146.6 kg)] 323 lb 3.1 oz (146.6 kg) (12/24 0544)  Last BM Date: 04/19/13   24-hour weight change: Weight change: -15 lb 3.4 oz (-6.9 kg)  Weight trends: Filed Weights   04/18/13 0508 04/19/13 0430 04/20/13 0544  Weight: 347 lb 0.1 oz (157.4 kg) 338 lb 6.5 oz (153.5 kg) 323 lb 3.1 oz (146.6 kg)    Intake/Output:  12/23 0701 - 12/24 0700 In: 1390 [P.O.:1340; IV Piggyback:50] Out: 3550 [Urine:3550]     Physical Exam: BP 118/65  Pulse 71  Temp(Src) 98.4 F (36.9 C) (Oral)  Resp 18  Ht 6' (1.829 m)  Wt 323 lb 3.1 oz (146.6 kg)  BMI 43.82 kg/m2  SpO2 97%  General: Vital signs reviewed and noted.  Very hard of hearing  Head: Normocephalic, atraumatic.  Eyes: Left  eye is blind  Throat: normal  Neck:  thick, difficult to assess JVP  Lungs:   clear  Heart:  RR  Abdomen:  Soft, non-tender, obese  Extremities: Marked chronic stasis changes , trace -1+ edema in right leg.  Left leg has significant lymphedema  Neurologic: A&O X3, CN II - XII are grossly intact except for hearing  Psych: Normal     Labs: BMET:  Recent Labs  04/19/13 0509 04/20/13 0513  NA 138 139  K 4.4 3.8  CL 100 100  CO2 26 29  GLUCOSE 128* 113*  BUN 31* 30*  CREATININE 1.71* 1.68*  CALCIUM 9.2 9.3  MG 2.2 2.1  PHOS 3.5 4.1    Liver function tests:  Recent Labs  04/19/13 0509 04/20/13 0513  ALBUMIN 3.0* 2.9*   No results found for this basename: LIPASE, AMYLASE,  in the last 72 hours  CBC:  Recent Labs  04/19/13 0509 04/20/13 0513  WBC 4.9 4.1  HGB 10.0* 9.7*  HCT 29.1* 28.1*  MCV 94.2 96.6  PLT 91* 99*    Cardiac Enzymes: No results found for this basename: CKTOTAL, CKMB, TROPONINI,  in the last 72 hours  Coagulation Studies:  Recent Labs  04/17/13 0845  LABPROT 14.5  INR 1.15    Other: No components found with this basename: POCBNP,  No  results found for this basename: DDIMER,  in the last 72 hours No results found for this basename: HGBA1C,  in the last 72 hours No results found for this basename: CHOL, HDL, LDLCALC, TRIG, CHOLHDL,  in the last 72 hours No results found for this basename: TSH, T4TOTAL, FREET3, T3FREE, THYROIDAB,  in the last 72 hours No results found for this basename: VITAMINB12, FOLATE, FERRITIN, TIBC, IRON, RETICCTPCT,  in the last 72 hours   Other results:  Tele:  NSR at 60  Medications:    Infusions:    Scheduled Medications: . amLODipine  10 mg Oral Daily  . aspirin EC  81 mg Oral Daily  . atorvastatin  20 mg Oral QHS  . cefTRIAXone (ROCEPHIN)  IV  2 g Intravenous Q24H  . cholecalciferol  5,000 Units Oral Daily  . ciprofloxacin-dexamethasone  4 drop Right Ear BID  . enoxaparin (LOVENOX) injection   75 mg Subcutaneous Q24H  . fluticasone  2 spray Each Nare Daily  . furosemide  80 mg Intravenous BID  . gabapentin  300 mg Oral BID  . insulin aspart  0-20 Units Subcutaneous TID WC  . insulin aspart  0-5 Units Subcutaneous QHS  . insulin detemir  15 Units Subcutaneous QHS  . labetalol  200 mg Oral BID  . latanoprost  1 drop Right Eye QHS  . loratadine  10 mg Oral Daily  . montelukast  10 mg Oral QHS  . potassium chloride  40 mEq Oral Daily  . saccharomyces boulardii  250 mg Oral BID  . sodium chloride  3 mL Intravenous Q12H  . tamsulosin  0.4 mg Oral QHS  . vitamin B-12  1,000 mcg Oral Daily    Assessment/ Plan:       Other and unspecified hyperlipidemia   Morbid obesity   Essential hypertension, benign   CAD, NATIVE VESSEL   SVT/ PSVT/ PAT   Acute on chronic diastolic heart failure   Acute renal failure   Chronic pain syndrome   UTI (lower urinary tract infection)   UTI (urinary tract infection)   Noncompliance   Chronic renal insufficiency   Diastolic CHF   Blindness of left eye   Hypokalemia  1. Chronic diastolic CHF-  Has diuresed briskly overnight.   Discussed with Dr. Malachi Bonds.  He has been on Zaroxalyn twice a week with his lasix.  Would  DC him  his normal home doses of Demadex and zaroxalyn.  OK for him to receive this AM's dose of IV lasix.    He should weight himself at home every day.  Follow up with Dr. Simona Huh in [place].    2. Obstructive sleep apnea - wearing his CPAP this am  3. Lymphedema- he needs to elevate his legs.  He has significant left leg lymphedema that will not likely be able to be treated with diuretics along.  He wears compression hose at home.  4. Noncompliance:   I suspect his difficulty hearing has lots to do with his noncompliance   Disposition:  Length of Stay: 6  Vesta Mixer, Montez Hageman., MD, [facility] 04/20/2013, 7:18 AM Office [phone] Pager [phone]

## 2013-04-21 LAB — CBC
Hemoglobin: 9.2 g/dL — ABNORMAL LOW (ref 13.0–17.0)
MCH: 32.3 pg (ref 26.0–34.0)
MCHC: 33.8 g/dL (ref 30.0–36.0)
Platelets: 88 10*3/uL — ABNORMAL LOW (ref 150–400)
RDW: 14.7 % (ref 11.5–15.5)
WBC: 3.6 10*3/uL — ABNORMAL LOW (ref 4.0–10.5)

## 2013-04-21 LAB — MAGNESIUM: Magnesium: 2 mg/dL (ref 1.5–2.5)

## 2013-04-21 LAB — GLUCOSE, CAPILLARY
Glucose-Capillary: 130 mg/dL — ABNORMAL HIGH (ref 70–99)
Glucose-Capillary: 152 mg/dL — ABNORMAL HIGH (ref 70–99)

## 2013-04-21 LAB — RENAL FUNCTION PANEL
Albumin: 3 g/dL — ABNORMAL LOW (ref 3.5–5.2)
Calcium: 9.2 mg/dL (ref 8.4–10.5)
Chloride: 100 mEq/L (ref 96–112)
GFR calc Af Amer: 45 mL/min — ABNORMAL LOW (ref >90)
GFR calc non Af Amer: 39 mL/min — ABNORMAL LOW (ref >90)
Glucose, Bld: 124 mg/dL — ABNORMAL HIGH (ref 70–99)
Phosphorus: 4.4 mg/dL (ref 2.3–4.6)
Potassium: 3.7 mEq/L (ref 3.5–5.1)
Sodium: 140 mEq/L (ref 135–145)

## 2013-04-21 LAB — CULTURE, BLOOD (ROUTINE X 2): Culture: NO GROWTH

## 2013-04-21 MED ORDER — SPIRONOLACTONE 25 MG PO TABS
25.0000 mg | ORAL_TABLET | Freq: Every day | ORAL | Status: DC
  Administered 2013-04-21 – 2013-04-22 (×2): 25 mg via ORAL
  Filled 2013-04-21 (×2): qty 1

## 2013-04-21 MED ORDER — NYSTATIN 100000 UNIT/ML MT SUSP
5.0000 mL | Freq: Four times a day (QID) | OROMUCOSAL | Status: DC
  Administered 2013-04-21 – 2013-04-22 (×3): 500000 [IU] via ORAL
  Filled 2013-04-21 (×6): qty 5

## 2013-04-21 MED ORDER — METOLAZONE 2.5 MG PO TABS: 2.5000 mg | ORAL_TABLET | ORAL | Status: DC

## 2013-04-21 MED ORDER — TORSEMIDE 20 MG PO TABS
60.0000 mg | ORAL_TABLET | Freq: Every day | ORAL | Status: DC
  Administered 2013-04-22: 60 mg via ORAL
  Filled 2013-04-21: qty 3

## 2013-04-21 MED ORDER — FLUCONAZOLE 200 MG PO TABS
200.0000 mg | ORAL_TABLET | Freq: Once | ORAL | Status: AC
  Administered 2013-04-21: 18:00:00 200 mg via ORAL
  Filled 2013-04-21: qty 1

## 2013-04-21 NOTE — Progress Notes (Signed)
PROGRESS NOTE  Subjective:   79 y.o. male with a history of morbid obesity, chronic diastolic heart failure EF 50% (previous EF 60-65%), HTN, SVT/PSVT/PAT, non obstructive CAD, DMII, OSA-CPAP, arthritis, fibromyalgia, and hyperlipidemia who was admitted on 12/18 for fevers, vomiting and dizziness. He was found to have bilateral otitis media with possible mastoiditis, Klebsiella UTI and strep bacteremia. He is feeling much better today. Patient denies SOB except when he walks "long distances" (from the car to the [place]). He denies CP, orthopnea or PND. He says he always has lower extremity edema but never quite this bad. He is unsure how his weight has increased so much in the hospital. He will not wear his CPAP here because he cannot tolerate the "gurgling". His son is going to see if his daughter will bring his home CPAP to the hospital.   He diuresed > 5 liters Monday and -2 liters Tuesday night  on lasix and metalazone.       Objective:    Vital Signs:   Temp:  [97.6 F (36.4 C)-98.1 F (36.7 C)] 98.1 F (36.7 C) (12/25 0543) Pulse Rate:  [60-67] 67 (12/25 0543) Resp:  [18-20] 18 (12/25 0543) BP: (122-142)/(59-80) 128/80 mmHg (12/25 0543) SpO2:  [90 %-98 %] 98 % (12/25 0543) Weight:  [323 lb 10.2 oz (146.8 kg)] 323 lb 10.2 oz (146.8 kg) (12/25 0543)  Last BM Date: 04/20/13   24-hour weight change: Weight change: 7.1 oz (0.2 kg)  Weight trends: Filed Weights   04/19/13 0430 04/20/13 0544 04/21/13 0543  Weight: 338 lb 6.5 oz (153.5 kg) 323 lb 3.1 oz (146.6 kg) 323 lb 10.2 oz (146.8 kg)    Intake/Output:  12/24 0701 - 12/25 0700 In: 960 [P.O.:960] Out: 1500 [Urine:1500] Total I/O In: 600 [P.O.:600] Out: 325 [Urine:325]   Physical Exam: BP 128/80  Pulse 67  Temp(Src) 98.1 F (36.7 C) (Oral)  Resp 18  Ht 6' (1.829 m)  Wt 323 lb 10.2 oz (146.8 kg)  BMI 43.88 kg/m2  SpO2 98%  General: Vital signs reviewed and noted.  Very hard of hearing  Head: Normocephalic,  atraumatic.  Eyes: Left eye is blind  Throat: normal  Neck:  thick, difficult to assess JVP  Lungs:   clear  Heart:  RR  Abdomen:  Soft, non-tender, obese  Extremities: Marked chronic stasis changes , trace -1+ edema in right leg.  Left leg has significant lymphedema  Neurologic: A&O X3, CN II - XII are grossly intact except for hearing  Psych: Normal     Labs: BMET:  Recent Labs  04/20/13 0513 04/21/13 0435  NA 139 140  K 3.8 3.7  CL 100 100  CO2 29 25  GLUCOSE 113* 124*  BUN 30* 30*  CREATININE 1.68* 1.61*  CALCIUM 9.3 9.2  MG 2.1 2.0  PHOS 4.1 4.4    Liver function tests:  Recent Labs  04/20/13 0513 04/21/13 0435  ALBUMIN 2.9* 3.0*   No results found for this basename: LIPASE, AMYLASE,  in the last 72 hours  CBC:  Recent Labs  04/20/13 0513 04/21/13 0435  WBC 4.1 3.6*  HGB 9.7* 9.2*  HCT 28.1* 27.2*  MCV 96.6 95.4  PLT 99* 88*    Cardiac Enzymes: No results found for this basename: CKTOTAL, CKMB, TROPONINI,  in the last 72 hours  Coagulation Studies: No results found for this basename: LABPROT, INR,  in the last 72 hours   Other results:  Tele:  NSR at  75  Medications:    Infusions:    Scheduled Medications: . amLODipine  10 mg Oral Daily  . aspirin EC  81 mg Oral Daily  . atorvastatin  20 mg Oral QHS  . cefTRIAXone (ROCEPHIN)  IV  2 g Intravenous Q24H  . cholecalciferol  5,000 Units Oral Daily  . ciprofloxacin-dexamethasone  4 drop Right Ear BID  . enoxaparin (LOVENOX) injection  75 mg Subcutaneous Q24H  . fluticasone  2 spray Each Nare Daily  . furosemide  80 mg Intravenous BID  . gabapentin  300 mg Oral BID  . insulin aspart  0-20 Units Subcutaneous TID WC  . insulin aspart  0-5 Units Subcutaneous QHS  . insulin detemir  15 Units Subcutaneous QHS  . labetalol  200 mg Oral BID  . latanoprost  1 drop Right Eye QHS  . loratadine  10 mg Oral Daily  . montelukast  10 mg Oral QHS  . potassium chloride  40 mEq Oral Daily  .  saccharomyces boulardii  250 mg Oral BID  . sodium chloride  3 mL Intravenous Q12H  . tamsulosin  0.4 mg Oral QHS  . vitamin B-12  1,000 mcg Oral Daily    Assessment/ Plan:       Other and unspecified hyperlipidemia   Morbid obesity   Essential hypertension, benign   CAD, NATIVE VESSEL   SVT/ PSVT/ PAT   Acute on chronic diastolic heart failure   Acute renal failure   Chronic pain syndrome   UTI (lower urinary tract infection)   UTI (urinary tract infection)   Noncompliance   Chronic renal insufficiency   Diastolic CHF   Blindness of left eye   Hypokalemia  1. Chronic diastolic CHF-  Has diuresed briskly overnight.   Discussed with Dr. Malachi Bonds.  He has been on Zaroxalyn twice a week with his lasix.  Would  DC him  his normal home doses of Demadex and zaroxalyn.agree with trial of aldactone to help with hypokalemia.   He should weight himself at home every day.  Follow up with Dr. Simona Huh in [place].    2. Obstructive sleep apnea - wearing his CPAP this am  3. Lymphedema- he needs to elevate his legs.  He has significant left leg lymphedema that will not likely be able to be treated with diuretics along.  He wears compression hose at home.  4. Noncompliance:   I suspect his difficulty hearing has lots to do with his noncompliance   Disposition:  Length of Stay: 7  Vesta Mixer, Montez Hageman., MD, [facility] 04/21/2013, 9:39 AM Office [phone] Pager [phone]

## 2013-04-21 NOTE — Progress Notes (Signed)
[facility] PROGRESS NOTE  [name] ZOX:[id number] DOB: [birth year] DOA: 04/14/2013 PCP: Catalina Pizza, MD  Assessment/Plan  Bilateral otitis media and possible mastoiditis, continuing to drain pus from the right ear  -  Continue ceftriaxone -  Not candidate for PICC due to CrCl, however, may be able to discharge with PIV and once PIV lost, can do IM injections to complete 14 day course IV/IM.  Transition to oral antibiotics after 14 days are complete.   -  Appreciate ENT and ID recommendations -  Spoke with Dr. Lazarus Salines from ENT who recommended 1 month of low dose clindamycin 150mg  TID and 2 weeks of FQ then f/u with ENT in 6 weeks.   -  Increase oral pain medication  Strep pneumo bacteremia sensitive to ceftriaxone, FQ, and PCN -  Continue ceftriaxone  Till 12/31 as per ID recommendations.  -  Appreciate infectious disease recommendations -  ECHO was technically severely limited -  Blood cultures 2 out of 2 from 12/18: Strep pneumo -  Blood cultures 12/19:  NGTD -  Blood culture 12/20:  NGTD  Dizziness may be related to otitis media/sinusitis and bacteremia.  DDx includes stroke, venous thrombosis -  Consider MRI if not improving with abx -  PT recommending SNF  Sinusitis with purulence draining from right nare -  Continue flonase -  Continue afrin and nasal saline   Acute on chronic diastolic heart failure (dry weight near 329lbs).  Patient has asymmetric edema L>R at baseline although both legs are more swollen recently.  Wt up to 347 lbs.   -  troponins neg  -  Consider spironolactone to help with potassium retention? -  Not candidate for ACEI given CKD -  Cardiology consultation  HTN/HLD, stable.  Continue norvasc, atorvastatin,   T2DM with peripheral neuropathy, CBG very mildly elevated -  Continue gabapentin for neuropathy -  Trend CBG -  Continue high dose SSI    CKD stage 3, baseline creatinine 1.8 -  Minimize nephrotoxins, renally dose medications  including gabapentin  Klebsiella UTI, pansensitive -  Continue abx   Hyponatremia likely due to acute CHF exac -  diuresis  Hypokalemia, likely secondary to diuretic use and unchanged despite oral supplements yesterday -  Repeat potassium level this afternoon -  Keep magnesium near 2:  Magnesium sulfate 2gm IV once  Hearing impairment, may be worsened by OM.     Normocytic anemia, likely anemia of chronic disease and possibly some vitamin B12 deficiency  -  Iron panel consistent with chronic disease -  B12 367, folate 1258 wnl -  TSH wnl -  Start cyanocobalamin daily  Thrombocytopenia, chronic.  Large platelets on smear.  plt decreased dramatically today, no evidence of bleeding.  Rule out mild DIC in setting of infection -  Fibrinogen wnl -  INR wnl -  Start vitamin B12   Diet:  Healthy heart, 2gm, 1.2L fluid restriction Access:  PIV IVF:  OFF Proph:  Lovenox.  Transition to SCD if plt less than or equal to 50K  Code Status: full Family Communication: none at bedside. Disposition Plan:  To SNF on Friday.  Consultants:  ENT by phone  ID   Procedures:  CT head  Antibiotics:  Ciprofloxacin x 1  Levofloxacin 12/19 >> 12/20  Vancomycin 12/19 >> 12/20  Ceftriaxone 12/20 >>  HPI/Subjective:  Right ear still draining pus.  Feeling a little better today.  Headache improved a little today.   Objective: Filed Vitals:  04/20/13 1441 04/20/13 2210 04/21/13 0543 04/21/13 1405  BP: 142/59 122/76 128/80 121/37  Pulse: 60 65 67 62  Temp: 97.6 F (36.4 C) 98 F (36.7 C) 98.1 F (36.7 C) 97.2 F (36.2 C)  TempSrc: Oral Oral Oral Oral  Resp: 20 19 18 20   Height:      Weight:   146.8 kg (323 lb 10.2 oz)   SpO2: 90% 90% 98% 97%    Intake/Output Summary (Last 24 hours) at 04/21/13 1850 Last data filed at 04/21/13 1530  Gross per 24 hour  Intake   1480 ml  Output   2600 ml  Net  -1120 ml   Filed Weights   04/19/13 0430 04/20/13 0544 04/21/13 0543   Weight: 153.5 kg (338 lb 6.5 oz) 146.6 kg (323 lb 3.1 oz) 146.8 kg (323 lb 10.2 oz)    Exam:   General:  [name], No acute distress  HEENT:  NCAT, MMM, purulence still draining from right ear but less copious  Cardiovascular:  RRR, nl S1, S2 no mrg, 2+ pulses, warm extremities  Respiratory:  Diminished bilateral BS with wheeze, no focal rales, rhonchi, no increased WOB  Abdomen:   NABS, soft, NT/ND  MSK:   Normal tone and bulk, 3+ LLE and 2+ RLE edema  Neuro:  Grossly intact  Data Reviewed: Basic Metabolic Panel:  Recent Labs Lab 04/17/13 0632 04/17/13 1426 04/18/13 0505 04/19/13 0509 04/20/13 0513 04/21/13 0435  NA 137  --  133* 138 139 140  K 3.4* 3.4* 3.3* 4.4 3.8 3.7  CL 101  --  96 100 100 100  CO2 26  --  25 26 29 25   GLUCOSE 161*  --  196* 128* 113* 124*  BUN 34*  --  33* 31* 30* 30*  CREATININE 2.07*  --  1.92* 1.71* 1.68* 1.61*  CALCIUM 8.6  --  8.7 9.2 9.3 9.2  MG 2.0  --  1.9 2.2 2.1 2.0  PHOS  --   --   --  3.5 4.1 4.4   Liver Function Tests:  Recent Labs Lab 04/14/13 2050 04/15/13 0215 04/16/13 0550 04/19/13 0509 04/20/13 0513 04/21/13 0435  AST 23 19 21   --   --   --   ALT 11 10 10   --   --   --   ALKPHOS 58 48 46  --   --   --   BILITOT 1.1 1.2 1.0  --   --   --   PROT 6.4 5.7* 5.7*  --   --   --   ALBUMIN 3.7 3.2* 3.0* 3.0* 2.9* 3.0*    Recent Labs Lab 04/14/13 2050  LIPASE 40   No results found for this basename: AMMONIA,  in the last 168 hours CBC:  Recent Labs Lab 04/14/13 2050 04/17/13 0632 04/18/13 1032 04/19/13 0509 04/20/13 0513 04/21/13 0435  WBC 6.9 5.3 4.4 4.9 4.1 3.6*  NEUTROABS 5.2 3.6  --   --   --   --   HGB 10.5* 9.5* 9.3* 10.0* 9.7* 9.2*  HCT 29.3* 27.5* 26.7* 29.1* 28.1* 27.2*  MCV 92.7 94.5 95.7 94.2 96.6 95.4  PLT 86* 65* 69* 91* 99* 88*   Cardiac Enzymes:  Recent Labs Lab 04/15/13 0215 04/15/13 0802 04/15/13 1403  TROPONINI <0.30 <0.30 <0.30   BNP (last 3 results)  Recent Labs   04/14/13 2050  PROBNP 1021.0*   CBG:  Recent Labs Lab 04/20/13 1658 04/21/13 0128 04/21/13 0735 04/21/13 1139 04/21/13 1645  GLUCAP 152* 130* 121* 247* 152*    Recent Results (from the past 240 hour(s))  URINE CULTURE     Status: None   Collection Time    04/14/13  8:29 PM      Result Value Range Status   Specimen Description URINE, CLEAN CATCH   Final   Special Requests NONE   Final   Culture  Setup Time     Final   Value: 04/15/2013 02:05     Performed at [facility] Count     Final   Value: >=100,000 COLONIES/ML     Performed at [facility]   Culture     Final   Value: KLEBSIELLA PNEUMONIAE     Performed at [facility]   Report Status 04/16/2013 FINAL   Final   Organism ID, Bacteria KLEBSIELLA PNEUMONIAE   Final  CULTURE, BLOOD (ROUTINE X 2)     Status: None   Collection Time    04/14/13  9:44 PM      Result Value Range Status   Specimen Description BLOOD RIGHT ANTECUBITAL   Final   Special Requests BOTTLES DRAWN AEROBIC AND ANAEROBIC 5CC   Final   Culture  Setup Time     Final   Value: 04/15/2013 02:04     Performed at [facility]   Culture     Final   Value: STREPTOCOCCUS PNEUMONIAE     Note: SUSCEPTIBILITIES PERFORMED ON PREVIOUS CULTURE WITHIN THE LAST 5 DAYS.     Note: Gram Stain Report Called to,Read Back By and Verified With: [facility] WILLARD 04/15/13 1415 BY SMITHERSJ     Performed at [facility]   Report Status 04/17/2013 FINAL   Final  CULTURE, BLOOD (ROUTINE X 2)     Status: None   Collection Time    04/14/13  9:50 PM      Result Value Range Status   Specimen Description BLOOD RIGHT HAND   Final   Special Requests BOTTLES DRAWN AEROBIC AND ANAEROBIC 5CC   Final   Culture  Setup Time     Final   Value: 04/15/2013 02:03     Performed at [facility]   Culture     Final   Value: STREPTOCOCCUS PNEUMONIAE     Note: Gram Stain Report Called to,Read Back By and Verified With: [facility] WILLARD  04/15/13 1415 BY SMITHERSJ     Performed at [facility]   Report Status 04/17/2013 FINAL   Final   Organism ID, Bacteria STREPTOCOCCUS PNEUMONIAE   Final  CULTURE, BLOOD (ROUTINE X 2)     Status: None   Collection Time    04/15/13  2:15 AM      Result Value Range Status   Specimen Description BLOOD RIGHT ANTECUBITAL   Final   Special Requests BOTTLES DRAWN AEROBIC ONLY 10CC   Final   Culture  Setup Time     Final   Value: 04/15/2013 08:55     Performed at [facility]   Culture     Final   Value: NO GROWTH 5 DAYS     Performed at [facility]   Report Status 04/21/2013 FINAL   Final  CULTURE, BLOOD (ROUTINE X 2)     Status: None   Collection Time    04/15/13  2:22 AM      Result Value Range Status   Specimen Description BLOOD RIGHT ARM   Final   Special Requests  BOTTLES DRAWN AEROBIC AND ANAEROBIC 10CC   Final   Culture  Setup Time     Final   Value: 04/15/2013 08:55     Performed at [facility]   Culture     Final   Value: NO GROWTH 5 DAYS     Performed at [facility]   Report Status 04/21/2013 FINAL   Final  CULTURE, BLOOD (SINGLE)     Status: None   Collection Time    04/16/13  5:50 AM      Result Value Range Status   Specimen Description BLOOD RIGHT ARM   Final   Special Requests BOTTLES DRAWN AEROBIC ONLY 8CC   Final   Culture  Setup Time     Final   Value: 04/16/2013 14:12     Performed at [facility]   Culture     Final   Value:        BLOOD CULTURE RECEIVED NO GROWTH TO DATE CULTURE WILL BE HELD FOR 5 DAYS BEFORE ISSUING A FINAL NEGATIVE REPORT     Performed at [facility]   Report Status PENDING   Incomplete     Studies: No results found.  Scheduled Meds: . amLODipine  10 mg Oral Daily  . aspirin EC  81 mg Oral Daily  . atorvastatin  20 mg Oral QHS  . cefTRIAXone (ROCEPHIN)  IV  2 g Intravenous Q24H  . cholecalciferol  5,000 Units Oral Daily  . ciprofloxacin-dexamethasone  4 drop Right Ear BID   . enoxaparin (LOVENOX) injection  75 mg Subcutaneous Q24H  . fluticasone  2 spray Each Nare Daily  . furosemide  80 mg Intravenous BID  . gabapentin  300 mg Oral BID  . insulin aspart  0-20 Units Subcutaneous TID WC  . insulin aspart  0-5 Units Subcutaneous QHS  . insulin detemir  15 Units Subcutaneous QHS  . labetalol  200 mg Oral BID  . latanoprost  1 drop Right Eye QHS  . loratadine  10 mg Oral Daily  . montelukast  10 mg Oral QHS  . nystatin  5 mL Oral QID  . potassium chloride  40 mEq Oral Daily  . saccharomyces boulardii  250 mg Oral BID  . sodium chloride  3 mL Intravenous Q12H  . spironolactone  25 mg Oral Daily  . tamsulosin  0.4 mg Oral QHS  . vitamin B-12  1,000 mcg Oral Daily   Continuous Infusions:    Active Problems:   Other and unspecified hyperlipidemia   Morbid obesity   Essential hypertension, benign   CAD, NATIVE VESSEL   SVT/ PSVT/ PAT   Acute on chronic diastolic heart failure   Acute renal failure   Chronic pain syndrome   UTI (lower urinary tract infection)   UTI (urinary tract infection)   Noncompliance   Chronic renal insufficiency   Diastolic CHF   Blindness of left eye   Hypokalemia    Time spent: 30 min    [name]  [facility] Pager [phone] If 7PM-7AM, please contact night-coverage at [email or web address], password [facility] 04/21/2013, 6:50 PM  LOS: 7 days

## 2013-04-22 DIAGNOSIS — H7090 Unspecified mastoiditis, unspecified ear: Secondary | ICD-10-CM | POA: Diagnosis not present

## 2013-04-22 DIAGNOSIS — G4730 Sleep apnea, unspecified: Secondary | ICD-10-CM | POA: Diagnosis not present

## 2013-04-22 DIAGNOSIS — I5032 Chronic diastolic (congestive) heart failure: Secondary | ICD-10-CM | POA: Diagnosis not present

## 2013-04-22 DIAGNOSIS — E119 Type 2 diabetes mellitus without complications: Secondary | ICD-10-CM | POA: Diagnosis not present

## 2013-04-22 DIAGNOSIS — Z5189 Encounter for other specified aftercare: Secondary | ICD-10-CM | POA: Diagnosis not present

## 2013-04-22 DIAGNOSIS — I129 Hypertensive chronic kidney disease with stage 1 through stage 4 chronic kidney disease, or unspecified chronic kidney disease: Secondary | ICD-10-CM | POA: Diagnosis not present

## 2013-04-22 DIAGNOSIS — I872 Venous insufficiency (chronic) (peripheral): Secondary | ICD-10-CM | POA: Diagnosis not present

## 2013-04-22 DIAGNOSIS — I509 Heart failure, unspecified: Secondary | ICD-10-CM | POA: Diagnosis not present

## 2013-04-22 DIAGNOSIS — R5381 Other malaise: Secondary | ICD-10-CM | POA: Diagnosis not present

## 2013-04-22 DIAGNOSIS — M6281 Muscle weakness (generalized): Secondary | ICD-10-CM | POA: Diagnosis not present

## 2013-04-22 DIAGNOSIS — Z794 Long term (current) use of insulin: Secondary | ICD-10-CM | POA: Diagnosis not present

## 2013-04-22 DIAGNOSIS — I10 Essential (primary) hypertension: Secondary | ICD-10-CM | POA: Diagnosis not present

## 2013-04-22 DIAGNOSIS — R269 Unspecified abnormalities of gait and mobility: Secondary | ICD-10-CM | POA: Diagnosis not present

## 2013-04-22 DIAGNOSIS — R279 Unspecified lack of coordination: Secondary | ICD-10-CM | POA: Diagnosis not present

## 2013-04-22 DIAGNOSIS — H902 Conductive hearing loss, unspecified: Secondary | ICD-10-CM | POA: Diagnosis not present

## 2013-04-22 DIAGNOSIS — I2510 Atherosclerotic heart disease of native coronary artery without angina pectoris: Secondary | ICD-10-CM | POA: Diagnosis not present

## 2013-04-22 DIAGNOSIS — G4733 Obstructive sleep apnea (adult) (pediatric): Secondary | ICD-10-CM | POA: Diagnosis not present

## 2013-04-22 DIAGNOSIS — I471 Supraventricular tachycardia: Secondary | ICD-10-CM | POA: Diagnosis not present

## 2013-04-22 DIAGNOSIS — R293 Abnormal posture: Secondary | ICD-10-CM | POA: Diagnosis not present

## 2013-04-22 DIAGNOSIS — H6520 Chronic serous otitis media, unspecified ear: Secondary | ICD-10-CM | POA: Diagnosis not present

## 2013-04-22 LAB — CULTURE, BLOOD (SINGLE): Culture: NO GROWTH

## 2013-04-22 LAB — RENAL FUNCTION PANEL
Albumin: 3.2 g/dL — ABNORMAL LOW (ref 3.5–5.2)
BUN: 28 mg/dL — ABNORMAL HIGH (ref 6–23)
Calcium: 9.4 mg/dL (ref 8.4–10.5)
Creatinine, Ser: 1.54 mg/dL — ABNORMAL HIGH (ref 0.50–1.35)
GFR calc Af Amer: 48 mL/min — ABNORMAL LOW (ref >90)
GFR calc non Af Amer: 41 mL/min — ABNORMAL LOW (ref >90)
Glucose, Bld: 113 mg/dL — ABNORMAL HIGH (ref 70–99)
Phosphorus: 4 mg/dL (ref 2.3–4.6)
Potassium: 3.5 mEq/L (ref 3.5–5.1)
Sodium: 139 mEq/L (ref 135–145)

## 2013-04-22 LAB — CBC
HCT: 29.9 % — ABNORMAL LOW (ref 39.0–52.0)
MCH: 33.3 pg (ref 26.0–34.0)
MCHC: 34.8 g/dL (ref 30.0–36.0)
Platelets: 107 10*3/uL — ABNORMAL LOW (ref 150–400)
RDW: 14.3 % (ref 11.5–15.5)

## 2013-04-22 LAB — CK: Total CK: 608 U/L — ABNORMAL HIGH (ref 7–232)

## 2013-04-22 LAB — MAGNESIUM: Magnesium: 2 mg/dL (ref 1.5–2.5)

## 2013-04-22 MED ORDER — TRAMADOL HCL 50 MG PO TABS: 50.0000 mg | ORAL_TABLET | Freq: Four times a day (QID) | ORAL | Status: DC | PRN

## 2013-04-22 MED ORDER — CIPROFLOXACIN-DEXAMETHASONE 0.3-0.1 % OT SUSP: 4.0000 [drp] | Freq: Two times a day (BID) | OTIC | Status: AC

## 2013-04-22 MED ORDER — CLINDAMYCIN HCL 150 MG PO CAPS: 150.0000 mg | ORAL_CAPSULE | Freq: Three times a day (TID) | ORAL | Status: DC

## 2013-04-22 MED ORDER — SPIRONOLACTONE 25 MG PO TABS: 25.0000 mg | ORAL_TABLET | Freq: Every day | ORAL | Status: AC

## 2013-04-22 MED ORDER — HYDROCODONE-ACETAMINOPHEN 5-325 MG PO TABS: 1.0000 | ORAL_TABLET | Freq: Four times a day (QID) | ORAL | Status: DC | PRN

## 2013-04-22 MED ORDER — CEFTRIAXONE SODIUM 2 G IJ SOLR: 2.0000 g | INTRAMUSCULAR | Status: AC

## 2013-04-22 NOTE — Progress Notes (Signed)
Clinical Social Work Department CLINICAL SOCIAL WORK PLACEMENT NOTE 04/22/2013  Patient:  [name], [name]  Account Number:  [id number] Admit date:  04/14/2013  Clinical Social Worker:  Unk Lightning, LCSW  Date/time:  04/22/2013 11:00 AM  Clinical Social Work is seeking post-discharge placement for this patient at the following level of care:   SKILLED NURSING   (*CSW will update this form in Epic as items are completed)   04/17/2013  Patient/family provided with [facility] Department of Clinical Social Work's list of facilities offering this level of care within the geographic area requested by the patient (or if unable, by the patient's family).  04/17/2013  Patient/family informed of their freedom to choose among providers that offer the needed level of care, that participate in Medicare, Medicaid or managed care program needed by the patient, have an available bed and are willing to accept the patient.  04/17/2013  Patient/family informed of [facility]' ownership interest in [facility], as well as of the fact that they are under no obligation to receive care at this facility.  PASARR submitted to EDS on existing # PASARR number received from EDS on   FL2 transmitted to all facilities in geographic area requested by pt/family on  04/17/2013 FL2 transmitted to all facilities within larger geographic area on   Patient informed that his/her managed care company has contracts with or will negotiate with  certain facilities, including the following:     Patient/family informed of bed offers received:  04/18/2013 Patient chooses bed at [facility] Physician recommends and patient chooses bed at    Patient to be transferred to [facility]F on  04/22/2013 Patient to be transferred to facility by Son  The following physician request were entered in Epic:   Additional Comments:

## 2013-04-22 NOTE — Progress Notes (Addendum)
Pt discharged to rehab center; discharge instructions explained to pt and son; pt to be transported to facility by his son; discharge packet given to son to deliver to rehab facility.  Per [facility] request, pt's PIV left intact as pt is to continue to receive IV antibiotics there.

## 2013-04-22 NOTE — Progress Notes (Signed)
Physical Therapy Treatment Patient Details Name: [name] MRN: [id number] DOB: 1932-01-14 Today's Date: 04/22/2013 Time: 4098-1191 PT Time Calculation (min): 28 min  PT Assessment / Plan / Recommendation  History of Present Illness 79 y.o. 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 He had chronic vertigo that has been getting worse. Today he was upset because his wife had to go to surgery. He had some potato soup and then was belching and then vomited. He's been having chillsx 3 months w/o fever. However today had measured fever in the EDy. Stable shortness of breath. Legs has been chronically swollen. Had flu shot this year. Per Dr. Arvilla Meres (cardiology) note from 06/23/2012 discharge weight range 370-372 pounds. Patient's current weight is 337 pounds.    PT Comments   Pt feeling better.  No c/o dizziness throughout session.  Amb 3 times with sitting rest breaks between due to noted DOE.  RA avg 98% and HR 78.   Follow Up Recommendations  SNF     Does the patient have the potential to tolerate intense rehabilitation     Barriers to Discharge        Equipment Recommendations  None recommended by PT    Recommendations for Other Services    Frequency Min 3X/week   Progress towards PT Goals Progress towards PT goals: Progressing toward goals  Plan      Precautions / Restrictions Precautions Precautions: Fall Restrictions Weight Bearing Restrictions: No    Pertinent Vitals/Pain No c/o pain    Mobility  Bed Mobility Bed Mobility: Supine to Sit Supine to Sit: With rails;5: Supervision Details for Bed Mobility Assistance: incr time  Transfers Transfers: Sit to Stand;Stand to Sit Sit to Stand: 5: Supervision;From bed;From chair/3-in-1 Stand to Sit: 5: Supervision;To  chair/3-in-1 Details for Transfer Assistance: Pt uses rocking forward momentum to shift body weight to stand a Ambulation/Gait Ambulation/Gait Assistance: 5: Supervision Ambulation Distance (Feet):  (amb 3 times with sitting rest breaks between) Assistive device: Rolling walker Ambulation/Gait Assistance Details: increased time with min VC's on purse lip breathing vs talking.  RA sats 96-99% HR 78 and Gait Pattern: Step-through pattern;Decreased stride length;Wide base of support Gait velocity: decreased     PT Goals (current goals can now be found in the care plan section)    Visit Information  Last PT Received On: 04/22/13 History of Present Illness: 79 y.o. 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 He had chronic vertigo that has been getting worse. Today he was upset because his wife had to go to surgery. He had some potato soup and then was belching and then vomited. He's been having chillsx 3 months w/o fever. However today had measured fever in the EDy. Stable shortness of breath. Legs has been chronically swollen. Had flu shot this year. Per Dr. Arvilla Meres (cardiology) note from 06/23/2012 discharge weight range 370-372 pounds. Patient's current weight is 337 pounds.     Subjective Data      Cognition       Balance     End of Session PT - End of Session Equipment Utilized During Treatment: Gait belt Activity Tolerance: Patient tolerated treatment well Patient left: in chair;with  family/visitor present Nurse Communication: Mobility status   Felecia Shelling  PTA [facility]  Acute  Rehab Pager      [phone] and a

## 2013-04-22 NOTE — Progress Notes (Signed)
Clinical Social Work  CSW faxed DC summary to [facility] who is agreeable to accept patient today. CSW prepared DC packet with FL2 and hard scripts included. CSW informed patient and son of DC and son agreeable to transport patient. MD asked CSW to check with SNF if they preferred to give antibiotics IM or IV and SNF prefers IV be left in place. CSW provided this information to MD and RN. RN aware of DC plans and will give patient DC packet when leaving the hospital.  CSW is signing off.  Unk Lightning, LCSW (Coverage for [employer])

## 2013-04-22 NOTE — Progress Notes (Signed)
    Pt has been DC'd to SNF.  He will follow up with  Dr. Diona Browner in our [place] office.  Vesta Mixer, Montez Hageman., MD, [facility] 04/22/2013, 2:01 PM Office - [phone] Pager [phone]

## 2013-04-22 NOTE — Discharge Summary (Signed)
Physician Discharge Summary  [name] ZOX:[id number] DOB: [birth year] DOA: 04/14/2013  PCP: Catalina Pizza, MD  Admit date: 04/14/2013 Discharge date: 04/22/2013  Recommendations for Outpatient Follow-up:  1. Continue IV ceftriaxone 2 gm daily through 12/31 2. Start clindamycin 150mg  TID on 04/28/2013 and continue for 2 weeks, then stop.    3. ENT follow up in late January  4. Cardiology follow up within 2 weeks for weight check, heart failure management 5. BMP weekly, results to be sent to PCP for review of creatinine and potassium 6. CBC in 1 week to follow up anemia and thrombocytopenia 7. Please f/u results of pending blood cultures 8. Ongoing PT/OT 9. Weight patient daily and if he gains more than 3-lbs in one day or 5-lbs in one week, may take additional dose of torsemide 20mg  in the afternoon 10. Changed insulin to levemir 15 units with high dose SSI at meals because of low normal blood sugars  Discharge Diagnoses:  Active Problems:   Other and unspecified hyperlipidemia   Morbid obesity   Essential hypertension, benign   CAD, NATIVE VESSEL   SVT/ PSVT/ PAT   Acute on chronic diastolic heart failure   Acute renal failure   Chronic pain syndrome   UTI (lower urinary tract infection)   UTI (urinary tract infection)   Noncompliance   Chronic renal insufficiency   Diastolic CHF   Blindness of left eye   Hypokalemia   Discharge Condition: Stable, improved  Diet recommendation: Healthy heart, 1.2 L fluid restriction, 2 g sodium  Wt Readings from Last 3 Encounters:  04/22/13 146.7 kg (323 lb 6.6 oz)  03/30/13 153.679 kg (338 lb 12.8 oz)  01/26/13 147.873 kg (326 lb)    History of present illness:  [name] is a 79 y.o. WM PMHx Hx left eye blindness secondary to trauma, SVT, CAD, Diastolic heart failure, OSA on CPAP, noncompliance, hyperlipidemia, diabetes Type 2 with peripheral neuropathy, Presented to ED with fever and dizziness and vomiting. He had chronic  vertigo that has been getting worse. Today he was upset because his wife had to go to surgery. He had some potato soup and then was belching and then vomited. He's been having chillsx 3 months w/o fever. However today had measured fever in the EDy. Stable shortness of breath. Legs has been chronically swollen. Had flu shot this year. Per Dr. Arvilla Meres (cardiology) note from 06/23/2012 discharge weight range 370-372 pounds. Patient's current weight is 337 pounds. Negative CP,positive fatigue x2 days, positive chronic SOB. States not weighing himself daily, unsure of what his baseline weight should be.  Hospital Course:   Bilateral otitis media and possible mastoiditis: The patient had severe bilateral ear and sinus pain initially and on the first day of admission he started draining pus and blood out of his right ear.  Because his CT scan demonstrated possible mastoiditis, ENT reviewed his images in his story.  They recommended a one-month course of broad-spectrum antibiotics and follow up in their clinic in approximately 6 weeks.  He was initially started on levofloxacin, but after results of cultures he was transitioned to ceftriaxone at the advice of infectious disease.  He should complete a two-week course followed by 2 additional weeks of low-dose clindamycin.  He may continue Flonase and nasal saline. Continue antibiotic ear drops through December 25, then stop.  Strep pneumonia bacteremia: Source was likely his bilateral otitis media and sinusitis. He was initially started on levofloxacin but after his initial blood cultures demonstrated gram-positive  cocci, vancomycin was added. After his blood cultures speciate it and sensitivities resulted he was transitioned to IV ceftriaxone. Infectious disease was consult. Echocardiogram was technically severely limited, however there is no evidence of vegetation. His blood cultures cleared quickly and blood cultures taken just a few hours after the initial  ones were heard the negative. It is unlikely that he has endocarditis. Infectious disease recommended that he complete a 14 day course of IV versus IM ceftriaxone. They recommend that he start clindamycin and after the initial 2 weeks of IV therapy at low dose to continue for an additional 2 weeks. The patient will followup with ENT in approximately 5 to 6 weeks.  Klebsiella urinary tract infection, pan sensitive. We will be completing a two-week course of ceftriaxone.  Dizziness is likely related to otitis media, sinusitis, and bacteremia. The patient had improvement in his strength and balance after initiation of antibiotics. If his balance problems return, recommended MRI of the brain to determine if he has had a stroke. He was seen by physical therapy who recommended to skilled nursing facility for rehabilitation and ongoing occupational and physical therapy.  Acute and chronic diastolic heart failure: The patient's dry weight is approximately 329 pounds. He presented with asymmetric lower extremity edema left leg larger than right leg. At per the patient and his family his edema is generally asymmetric. His legs have been more swollen than usual. His weight trended up to 347 pounds during this admission, likely secondary to sepsis. His weight trended up despite continuing metolazone and Lasix IV. He probably had some difficulty with diuresis initially because of his low potassium.  After his potassium levels normalized, he underwent brisk diuresis on 12/22 (-5.2L, weight dow to 338lbs). His troponins were negative. His echocardiogram was very limited. He was seen by cardiology who agreed with the current plan of intermittent metolazone and Lasix. The patient will continue IV wheezing CV milligrams IV twice a day on December 23, and if he has diarrhea he states further towards his dry weight of 329 pounds, he may resume his home metolazone and torsemide regimen and be transferred to skilled nursing  facility for ongoing care.  Hyperlipidemia, hypertension, stable. Continue Norvasc and atorvastatin.  Diabetes mellitus type 2 with peripheral neuropathy, CBGs were mildly elevated. He was placed on high-dose sliding-scale insulin and levemir.  He continue gabapentin for his peripheral neuropathy.  He should continue levemir 15 units nightly with high dose SSI with meals at SNF.  Titration of insulin as needed.  F/u with PCP for ongoing diabetes care and management.  CKD stage III, baseline creatinine 1.8. Minimize nephrotoxins and renally dosed medications including gabapentin.  His ankle myosin was also renally dosed. He had a mild elevation in his creatinine which was likely secondary to his vancomycin and to his diastolic heart failure exacerbation. His creatinine trended down with diuresis and cessation of his vancomycin.  Hyponatremia was likely secondary to acute heart failure exacerbation and improved with diuresis.  Hypokalemia was likely secondary to diuretic use in the in adequate oral supplementation, likely noncompliance. He was started on IV and oral potassium supplementation and eventually increased to potassium chloride 60 mEq every 4 hours for several days before his potassium levels normalized.  Transitioning today 2 potassium 40 mEq once daily, however this dose may need to be adjusted after the results of his electrolytes tomorrow morning.  Anemia of chronic disease and possibly vitamin B12 deficiency. His iron panel was consistent with chronic disease, vitamin B12 level  367, folate 1258 . His TSH was within normal limits and he was started on vitamin B12 thousand micrograms daily.  Thrombocytopenia, chronic. He had large platelets on his smear. His platelet count dropped to a nadir of 65 on December 21. He did not have schistocytes on smear and his fibrinogen remained normal. He may have had some transient thrombocytopenia secondary to his sepsis. He did not have evidence of  ongoing blood loss. His platelet count has trended up to greater than 100 and recommend repeating in approximately one week.  Recommend hematology followup, primary care doctor to refer.  Hard of hearing  Consultants:  ENT by phone  ID, Dr. Ninetta Lights Cardiology, Dr. Jens Som, Dr. Elease Hashimoto Procedures:  CT head Antibiotics:  Ciprofloxacin x 1  Levofloxacin 12/19 >> 12/20  Vancomycin 12/19 >> 12/20  Ceftriaxone 12/20 >> 12/31   Filed Vitals:   04/22/13 0600  BP: 119/86  Pulse: 68  Temp: 98.1 F (36.7 C)  Resp: 18   Filed Vitals:   04/21/13 1405 04/21/13 2114 04/21/13 2247 04/22/13 0600  BP: 121/37 122/80 144/78 119/86  Pulse: 62 64 65 68  Temp: 97.2 F (36.2 C) 97.5 F (36.4 C)  98.1 F (36.7 C)  TempSrc: Oral Oral  Oral  Resp: 20 18  18   Height:      Weight:    146.7 kg (323 lb 6.6 oz)  SpO2: 97% 96%  98%    General: [name], No acute distress  HEENT: NCAT, MMM, purulence still draining from right ear but less copious  Cardiovascular: RRR, nl S1, S2 no mrg, 2+ pulses, warm extremities  Respiratory: Diminished bilateral BS, less wheeze today, no focal rales, rhonchi, no increased WOB  Abdomen: NABS, soft, NT/ND  MSK: Normal tone and bulk, 2+ LLE and 1+ RLE edema  Neuro: Grossly intact   Discharge Instructions      Discharge Orders   Future Appointments Provider Department Dept Phone   06/15/2013 2:00 PM Jonelle Sidle, MD [facility] [phone]   Future Orders Complete By Expires   Diet - low sodium heart healthy  As directed    Discharge instructions  As directed    Comments:     Follow up with PCP , ENT and cardiology as recommended .       Medication List    STOP taking these medications       insulin glargine 100 UNIT/ML injection  Commonly known as:  LANTUS     simvastatin 40 MG tablet  Commonly known as:  ZOCOR      TAKE these medications       acetaminophen 325 MG tablet  Commonly known as:  TYLENOL  Take  2 tablets (650 mg total) by mouth every 6 (six) hours as needed for mild pain, moderate pain or headache.     amLODipine 10 MG tablet  Commonly known as:  NORVASC  Take 10 mg by mouth daily.     aspirin 81 MG chewable tablet  Chew 2 tablets (162 mg total) by mouth daily.     cetirizine 10 MG tablet  Commonly known as:  ZYRTEC  Take 10 mg by mouth every evening.     ciprofloxacin-dexamethasone otic suspension  Commonly known as:  CIPRODEX  Place 4 drops into the right ear 2 (two) times daily.     clindamycin 150 MG capsule  Commonly known as:  CLEOCIN  Take 1 capsule (150 mg total) by mouth 3 (three) times daily.  cyanocobalamin 1000 MCG tablet  Take 1 tablet (1,000 mcg total) by mouth daily.     D-3-5 5000 UNITS capsule  Generic drug:  Cholecalciferol  Take 5,000 Units by mouth daily.     dextrose 5 % SOLN 50 mL with cefTRIAXone 2 G SOLR 2 g  Inject 2 g into the vein daily.  Start taking on:  04/23/2013     fluticasone 50 MCG/ACT nasal spray  Commonly known as:  FLONASE  Place 2 sprays into the nose daily.     gabapentin 300 MG capsule  Commonly known as:  NEURONTIN  Take 1 capsule (300 mg total) by mouth 2 (two) times daily.     HYDROcodone-acetaminophen 5-325 MG per tablet  Commonly known as:  NORCO/VICODIN  Take 1-2 tablets by mouth every 6 (six) hours as needed for moderate pain.     insulin aspart 100 UNIT/ML injection  Commonly known as:  novoLOG  Inject 0-20 Units into the skin 3 (three) times daily with meals.     insulin detemir 100 UNIT/ML injection  Commonly known as:  LEVEMIR  Inject 0.15 mLs (15 Units total) into the skin at bedtime.     labetalol 200 MG tablet  Commonly known as:  NORMODYNE  Take 1 tablet (200 mg total) by mouth 2 (two) times daily.     latanoprost 0.005 % ophthalmic solution  Commonly known as:  XALATAN  Place 1 drop into the right eye at bedtime.     lidocaine 5 %  Commonly known as:  LIDODERM  Place 1 patch onto the  skin daily as needed (for pain). Remove & Discard patch within 12 hours or as directed by MD.     metolazone 2.5 MG tablet  Commonly known as:  ZAROXOLYN  Take 1 tablet (2.5 mg total) by mouth 2 (two) times a week. Sunday and Wed     montelukast 10 MG tablet  Commonly known as:  SINGULAIR  Take 10 mg by mouth at bedtime.     potassium chloride SA 20 MEQ tablet  Commonly known as:  K-DUR,KLOR-CON  Take 2 tablets (40 mEq total) by mouth daily.     saccharomyces boulardii 250 MG capsule  Commonly known as:  FLORASTOR  Take 1 capsule (250 mg total) by mouth 2 (two) times daily.     sodium chloride 0.65 % Soln nasal spray  Commonly known as:  OCEAN  Place 1 spray into both nostrils as needed for congestion.     spironolactone 25 MG tablet  Commonly known as:  ALDACTONE  Take 1 tablet (25 mg total) by mouth daily.     tamsulosin 0.4 MG Caps capsule  Commonly known as:  FLOMAX  Take 0.4 mg by mouth at bedtime.     torsemide 20 MG tablet  Commonly known as:  DEMADEX  Take 60 mg daily. If home weight greater than 329 lbs take an extra 20 mg.     traMADol 50 MG tablet  Commonly known as:  ULTRAM  Take 1 tablet (50 mg total) by mouth every 6 (six) hours as needed for severe pain.       Follow-up Information   Follow up with Catalina Pizza, MD. Schedule an appointment as soon as possible for a visit in 1 month.   Specialty:  Internal Medicine   Contact information:    [address]  [place] [address] [phone]       Follow up with Nona Dell, MD. Schedule an  appointment as soon as possible for a visit in 2 weeks.   Specialty:  Cardiology   Contact information:   [address] [place] [address] [phone]       Follow up with BATES, DWIGHT, MD. Schedule an appointment as soon as possible for a visit in 5 weeks.   Specialty:  Otolaryngology   Contact information:   [address] [place] [address] [phone]       The results  of significant diagnostics from this hospitalization (including imaging, microbiology, ancillary and laboratory) are listed below for reference.    Significant Diagnostic Studies: Dg Chest 2 View  04/14/2013   CLINICAL DATA:  Fever, emesis, history diabetes  EXAM: CHEST  2 VIEW  COMPARISON:  06/24/2012  FINDINGS: Enlargement of cardiac silhouette with pulmonary vascular congestion.  Atherosclerotic calcification aorta.  Minimal bibasilar atelectasis.  No acute failure or consolidation.  No pleural effusion or pneumothorax.  Scattered endplate spur formation thoracic spine.  IMPRESSION: Enlargement of cardiac silhouette with pulmonary vascular congestion.  Bibasilar atelectasis.   Electronically Signed   By: Ulyses Southward M.D.   On: 04/14/2013 21:32   Ct Head Wo Contrast  04/14/2013   CLINICAL DATA:  Dizziness  EXAM: CT HEAD WITHOUT CONTRAST  TECHNIQUE: Contiguous axial images were obtained from the base of the skull through the vertex without intravenous contrast.  COMPARISON:  06/24/2006  FINDINGS: Skull and Sinuses:Bilateral mastoid and middle ear opacification, present on the right in 2008. Patchy mucosal thickening in the bilateral paranasal sinuses, most severe in the left maxillary antrum, right anterior ethmoids, and right frontal sinus. Some of the contents is high-density, compatible with inspissated secretions or fungal colonization. Linear foreign body in the left maxillary antrum is likely a displaced plate for treatment of remote blowout fracture of the left orbital floor - also seen in 2008. There is also a remote blowout fracture of the medial wall left orbit.  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 Right proptosis, without retro-orbital mass or inflammation.  Brain: No evidence of acute abnormality, such as acute infarction, hemorrhage, hydrocephalus, or mass lesion/mass effect. There is atrophy and mild chronic small vessel ischemic white matter changes.  IMPRESSION: 1. No evidence of acute  intracranial disease. 2. Bilateral mastoid and middle ear opacification. Consider mastoiditis as a cause of the patient's dizziness. 3. Multifocal inflammatory paranasal sinus disease. 4. Remote trauma to the left orbit, discussed above.   Electronically Signed   By: Tiburcio Pea M.D.   On: 04/14/2013 21:51    Microbiology: Recent Results (from the past 240 hour(s))  URINE CULTURE     Status: None   Collection Time    04/14/13  8:29 PM      Result Value Range Status   Specimen Description URINE, CLEAN CATCH   Final   Special Requests NONE   Final   Culture  Setup Time     Final   Value: 04/15/2013 02:05     Performed at [facility] Count     Final   Value: >=100,000 COLONIES/ML     Performed at [facility]   Culture     Final   Value: KLEBSIELLA PNEUMONIAE     Performed at [facility]   Report Status 04/16/2013 FINAL   Final   Organism ID, Bacteria KLEBSIELLA PNEUMONIAE   Final  CULTURE, BLOOD (ROUTINE X 2)     Status: None   Collection Time  04/14/13  9:44 PM      Result Value Range Status   Specimen Description BLOOD RIGHT ANTECUBITAL   Final   Special Requests BOTTLES DRAWN AEROBIC AND ANAEROBIC 5CC   Final   Culture  Setup Time     Final   Value: 04/15/2013 02:04     Performed at [facility]   Culture     Final   Value: STREPTOCOCCUS PNEUMONIAE     Note: SUSCEPTIBILITIES PERFORMED ON PREVIOUS CULTURE WITHIN THE LAST 5 DAYS.     Note: Gram Stain Report Called to,Read Back By and Verified With: [facility] WILLARD 04/15/13 1415 BY SMITHERSJ     Performed at [facility]   Report Status 04/17/2013 FINAL   Final  CULTURE, BLOOD (ROUTINE X 2)     Status: None   Collection Time    04/14/13  9:50 PM      Result Value Range Status   Specimen Description BLOOD RIGHT HAND   Final   Special Requests BOTTLES DRAWN AEROBIC AND ANAEROBIC 5CC   Final   Culture  Setup Time     Final   Value: 04/15/2013 02:03     Performed at [facility]   Culture     Final   Value: STREPTOCOCCUS PNEUMONIAE     Note: Gram Stain Report Called to,Read Back By and Verified With: [facility] WILLARD 04/15/13 1415 BY SMITHERSJ     Performed at [facility]   Report Status 04/17/2013 FINAL   Final   Organism ID, Bacteria STREPTOCOCCUS PNEUMONIAE   Final  CULTURE, BLOOD (ROUTINE X 2)     Status: None   Collection Time    04/15/13  2:15 AM      Result Value Range Status   Specimen Description BLOOD RIGHT ANTECUBITAL   Final   Special Requests BOTTLES DRAWN AEROBIC ONLY 10CC   Final   Culture  Setup Time     Final   Value: 04/15/2013 08:55     Performed at [facility]   Culture     Final   Value: NO GROWTH 5 DAYS     Performed at [facility]   Report Status 04/21/2013 FINAL   Final  CULTURE, BLOOD (ROUTINE X 2)     Status: None   Collection Time    04/15/13  2:22 AM      Result Value Range Status   Specimen Description BLOOD RIGHT ARM   Final   Special Requests BOTTLES DRAWN AEROBIC AND ANAEROBIC 10CC   Final   Culture  Setup Time     Final   Value: 04/15/2013 08:55     Performed at [facility]   Culture     Final   Value: NO GROWTH 5 DAYS     Performed at [facility]   Report Status 04/21/2013 FINAL   Final  CULTURE, BLOOD (SINGLE)     Status: None   Collection Time    04/16/13  5:50 AM      Result Value Range Status   Specimen Description BLOOD RIGHT ARM   Final   Special Requests BOTTLES DRAWN AEROBIC ONLY 8CC   Final   Culture  Setup Time     Final   Value: 04/16/2013 14:12     Performed at [facility]   Culture     Final   Value: NO GROWTH 5 DAYS     Performed at [facility]   Report  Status 04/22/2013 FINAL   Final     Labs: Basic Metabolic Panel:  Recent Labs Lab 04/18/13 0505 04/19/13 0509 04/20/13 0513 04/21/13 0435 04/22/13 0510  NA 133* 138 139 140 139  K 3.3* 4.4 3.8 3.7 3.5  CL 96 100 100 100 100  CO2 25 26 29 25 29   GLUCOSE 196* 128*  113* 124* 113*  BUN 33* 31* 30* 30* 28*  CREATININE 1.92* 1.71* 1.68* 1.61* 1.54*  CALCIUM 8.7 9.2 9.3 9.2 9.4  MG 1.9 2.2 2.1 2.0 2.0  PHOS  --  3.5 4.1 4.4 4.0   Liver Function Tests:  Recent Labs Lab 04/16/13 0550 04/19/13 0509 04/20/13 0513 04/21/13 0435 04/22/13 0510  AST 21  --   --   --   --   ALT 10  --   --   --   --   ALKPHOS 46  --   --   --   --   BILITOT 1.0  --   --   --   --   PROT 5.7*  --   --   --   --   ALBUMIN 3.0* 3.0* 2.9* 3.0* 3.2*   No results found for this basename: LIPASE, AMYLASE,  in the last 168 hours No results found for this basename: AMMONIA,  in the last 168 hours CBC:  Recent Labs Lab 04/17/13 0632 04/18/13 1032 04/19/13 0509 04/20/13 0513 04/21/13 0435 04/22/13 0510  WBC 5.3 4.4 4.9 4.1 3.6* 3.8*  NEUTROABS 3.6  --   --   --   --   --   HGB 9.5* 9.3* 10.0* 9.7* 9.2* 10.4*  HCT 27.5* 26.7* 29.1* 28.1* 27.2* 29.9*  MCV 94.5 95.7 94.2 96.6 95.4 95.8  PLT 65* 69* 91* 99* 88* 107*   Cardiac Enzymes:  Recent Labs Lab 04/15/13 1403 04/22/13 0510  CKTOTAL  --  608*  TROPONINI <0.30  --    BNP: BNP (last 3 results)  Recent Labs  04/14/13 2050  PROBNP 1021.0*   CBG:  Recent Labs Lab 04/21/13 0735 04/21/13 1139 04/21/13 1645 04/21/13 2124 04/22/13 0740  GLUCAP 121* 247* 152* 170* 120*    Time coordinating discharge: 45 minutes  Signed:  Kathlen Mody [facility] 04/22/2013, 11:06 AM

## 2013-04-26 ENCOUNTER — Telehealth: Payer: Self-pay | Admitting: Cardiology

## 2013-04-26 NOTE — Telephone Encounter (Signed)
Received a telephone call from Floyd Hill at the [facility]  [phone]. States that Mr. Hehn is at the Nursing home now and Dr. Olena Leatherwood is wanting to refer patient to the [facility] for weight management. I informed Judeth Cornfield that we would need an order sent to [place] to make this referral.

## 2013-05-02 ENCOUNTER — Encounter: Payer: Medicare Other | Admitting: Cardiology

## 2013-05-02 ENCOUNTER — Encounter: Payer: Self-pay | Admitting: Cardiology

## 2013-05-02 ENCOUNTER — Ambulatory Visit (INDEPENDENT_AMBULATORY_CARE_PROVIDER_SITE_OTHER): Payer: Medicare Other | Admitting: Cardiology

## 2013-05-02 VITALS — BP 120/70 | HR 64 | Ht 72.0 in | Wt 314.0 lb

## 2013-05-02 DIAGNOSIS — I471 Supraventricular tachycardia: Secondary | ICD-10-CM | POA: Diagnosis not present

## 2013-05-02 DIAGNOSIS — I10 Essential (primary) hypertension: Secondary | ICD-10-CM

## 2013-05-02 DIAGNOSIS — I2510 Atherosclerotic heart disease of native coronary artery without angina pectoris: Secondary | ICD-10-CM | POA: Diagnosis not present

## 2013-05-02 DIAGNOSIS — I5032 Chronic diastolic (congestive) heart failure: Secondary | ICD-10-CM

## 2013-05-02 NOTE — Progress Notes (Signed)
Clinical Summary [name] is an 80 y.o.male last seen in early December 2014. We intensify Demadex dosing for a week at that time due to weight gain. He was to have followup in the CHF clinic, although was ultimately admitted to the hospital with fever and dizziness as well as nausea and emesis, diagnosed with infections including bilateral otitis media with possible mastoiditis, pneumonia, and a Klebsiella urinary tract infection. He was treated with antibiotics, discharge weight was reported at 337 pounds. Discharge medications included spironolactone at 25 mg daily and Demadex 60 mg daily. I also see a weight from December 26 at 323 pounds.  Lab work from December 26 showed potassium 3.5, BUN 28, creatinine 1.5, magnesium 2.0.  Very limited echocardiogram from December 19 reported overall normal LVEF, no assessment of wall motion or diastolic function, left atrium described as moderately dilated, PASP 54 mm mercury.  Today he is here with his son. He is currently in the [facility] after his hospitalization, of note his wife is there as well following hip fracture. He has been receiving regular diuretics as noted below, weight is down further to 314 pounds.  Both patient and son tell me that there is interest in establishing his primary care with Dr. Dimas [name], a physician that they also know personally. The patient and his wife are interested in transitioning their care to Dr. Dimas [name].   Allergies  Allergen Reactions  . Ambien [Zolpidem Tartrate]     Makes him cuss like a sailor  . Lyrica [Pregabalin]     Causes him to have fluid  . Penicillins Hives    nightmares  . Piroxicam     unknown  . Sulfonamide Derivatives     Night sweats    Current Outpatient Prescriptions  Medication Sig Dispense Refill  . acetaminophen (TYLENOL) 325 MG tablet Take 2 tablets (650 mg total) by mouth every 6 (six) hours as needed for mild pain, moderate pain or headache.      Marland Kitchen amLODipine  (NORVASC) 10 MG tablet Take 10 mg by mouth daily.      Marland Kitchen aspirin 81 MG chewable tablet Chew 2 tablets (162 mg total) by mouth daily.      . calcium-vitamin D (OSCAL WITH D) 500-200 MG-UNIT per tablet Take 1 tablet by mouth 2 (two) times daily.      . cetirizine (ZYRTEC) 10 MG tablet Take 10 mg by mouth every evening.       . Cholecalciferol (D-3-5) 5000 UNITS capsule Take 5,000 Units by mouth daily.       . clindamycin (CLEOCIN) 150 MG capsule Take 1 capsule (150 mg total) by mouth 3 (three) times daily.  45 capsule  0  . fluticasone (FLONASE) 50 MCG/ACT nasal spray Place 2 sprays into the nose daily.       Marland Kitchen gabapentin (NEURONTIN) 300 MG capsule Take 1 capsule (300 mg total) by mouth 2 (two) times daily.  60 capsule  4  . HYDROcodone-acetaminophen (NORCO/VICODIN) 5-325 MG per tablet Take 1-2 tablets by mouth every 6 (six) hours as needed for moderate pain.  30 tablet  0  . insulin detemir (LEVEMIR) 100 UNIT/ML injection Inject 0.15 mLs (15 Units total) into the skin at bedtime.  10 mL  12  . labetalol (NORMODYNE) 200 MG tablet Take 1 tablet (200 mg total) by mouth 2 (two) times daily.  60 tablet  1  . latanoprost (XALATAN) 0.005 % ophthalmic solution Place 1 drop into the right eye at  bedtime.       . lidocaine (LIDODERM) 5 % Place 1 patch onto the skin daily as needed (for pain). Remove & Discard patch within 12 hours or as directed by MD.      . metolazone (ZAROXOLYN) 2.5 MG tablet Take 1 tablet (2.5 mg total) by mouth 2 (two) times a week. Sunday and Wed  30 tablet  0  . montelukast (SINGULAIR) 10 MG tablet Take 10 mg by mouth at bedtime.       . Multiple Vitamin (MULTIVITAMIN) tablet Take 1 tablet by mouth daily.      Marland Kitchen nystatin (MYCOSTATIN) 100000 UNIT/ML suspension Take 5 mLs by mouth 4 (four) times daily.      . potassium chloride SA (K-DUR,KLOR-CON) 20 MEQ tablet Take 2 tablets (40 mEq total) by mouth daily.  60 tablet  0  . saccharomyces boulardii (FLORASTOR) 250 MG capsule Take 1 capsule  (250 mg total) by mouth 2 (two) times daily.  60 capsule  0  . sodium chloride (OCEAN) 0.65 % SOLN nasal spray Place 1 spray into both nostrils as needed for congestion.    0  . spironolactone (ALDACTONE) 25 MG tablet Take 1 tablet (25 mg total) by mouth daily.      . Tamsulosin HCl (FLOMAX) 0.4 MG CAPS Take 0.4 mg by mouth at bedtime.       . torsemide (DEMADEX) 20 MG tablet Take 60 mg daily. If home weight greater than 329 lbs take an extra 20 mg.  100 tablet  6  . traMADol (ULTRAM) 50 MG tablet Take 1 tablet (50 mg total) by mouth every 6 (six) hours as needed for severe pain.  30 tablet    . vitamin B-12 1000 MCG tablet Take 1 tablet (1,000 mcg total) by mouth daily.  30 tablet  0   No current facility-administered medications for this visit.    Past Medical History  Diagnosis Date  . PSVT (paroxysmal supraventricular tachycardia)     AVNRT  . Coronary atherosclerosis of native coronary artery     Nonobstructive, LVEF 65%  . Hyperlipidemia   . Morbid obesity   . Arthritis   . Type 2 diabetes mellitus   . Vertigo   . Venous insufficiency   . Sleep apnea, obstructive   . Chronic diastolic heart failure     Social History [name] reports that he has never smoked. He has never used smokeless tobacco. [name] reports that he does not drink alcohol.  Review of Systems No chest pain, no palpitations. No falls. Using a wheelchair currently as he is still weak. He continues on oral antibiotics. Otherwise negative.  Physical Examination Filed Vitals:   05/02/13 0908  BP: 120/70  Pulse: 64   Filed Weights   05/02/13 0908  Weight: 314 lb (142.429 kg)    Morbidly obese, appears comfortable at rest.  HEENT: Conjunctiva and lids normal, oropharynx clear.  Neck: Supple, increased girth, difficult to appreciate JVP, no carotid bruits, no thyromegaly. Lungs: Clear to auscultation, nonlabored breathing at rest.  Cardiac: Regular rate and rhythm, no S3, 2/6 systolic murmur, no  pericardial rub.  Abdomen: Soft, nontender, obesey, bowel sounds present.  Extremities: Chronic appearing edema, compression hose on.  Skin: Warm and dry.  Musculoskeletal: No kyphosis.  Neuropsychiatric: Alert and oriented x3, affect grossly appropriate.   Problem List and Plan   Chronic diastolic heart failure Continue current diuretic regimen which includes Demadex 60 mg daily, Zaroxolyn 2.5 mg twice a week, and  Aldactone 25 mg daily, also potassium supplements. His weight continues to decrease, currently 314 pounds. Will make sure he has an interval established followup in the CHF clinic, and then schedule a visit in 3 months with BMET.  SVT/ PSVT/ PAT Quiescent. Continues on beta blocker in the form of labetalol.  CAD, NATIVE VESSEL No angina symptoms, history of nonobstructive disease. He continues on aspirin.  Essential hypertension, benign Blood pressure is normal today.    Satira Sark, M.D., F.A.C.C.

## 2013-05-02 NOTE — Assessment & Plan Note (Addendum)
Continue current diuretic regimen which includes Demadex 60 mg daily, Zaroxolyn 2.5 mg twice a week, and Aldactone 25 mg daily, also potassium supplements. His weight continues to decrease, currently 314 pounds. Will make sure he has an interval established followup in the CHF clinic, and then schedule a visit in 3 months with BMET.

## 2013-05-02 NOTE — Assessment & Plan Note (Signed)
Blood pressure is normal today. 

## 2013-05-02 NOTE — Patient Instructions (Signed)
Your physician recommends that you schedule a follow-up appointment in: 3 months. You will receive a reminder letter in the mail in about 1-2 months reminding you to call and schedule your appointment. If you don't receive this letter, please contact our office. Your physician recommends that you continue on your current medications as directed. Please refer to the Current Medication list given to you today. Your physician recommends that you return lab work in 3 months just before your next visit with Dr. Domenic Polite in April 2015. Please reschedule your appointment with the CHF clinic for 1 month.

## 2013-05-02 NOTE — Assessment & Plan Note (Signed)
Quiescent. Continues on beta blocker in the form of labetalol.

## 2013-05-02 NOTE — Assessment & Plan Note (Signed)
No angina symptoms, history of nonobstructive disease. He continues on aspirin.

## 2013-05-21 DIAGNOSIS — G4730 Sleep apnea, unspecified: Secondary | ICD-10-CM | POA: Diagnosis not present

## 2013-05-21 DIAGNOSIS — I872 Venous insufficiency (chronic) (peripheral): Secondary | ICD-10-CM | POA: Diagnosis not present

## 2013-05-21 DIAGNOSIS — I10 Essential (primary) hypertension: Secondary | ICD-10-CM | POA: Diagnosis not present

## 2013-05-21 DIAGNOSIS — IMO0001 Reserved for inherently not codable concepts without codable children: Secondary | ICD-10-CM | POA: Diagnosis not present

## 2013-05-25 DIAGNOSIS — H902 Conductive hearing loss, unspecified: Secondary | ICD-10-CM | POA: Diagnosis not present

## 2013-05-25 DIAGNOSIS — H6520 Chronic serous otitis media, unspecified ear: Secondary | ICD-10-CM | POA: Diagnosis not present

## 2013-05-25 DIAGNOSIS — H905 Unspecified sensorineural hearing loss: Secondary | ICD-10-CM | POA: Diagnosis not present

## 2013-05-25 DIAGNOSIS — H903 Sensorineural hearing loss, bilateral: Secondary | ICD-10-CM | POA: Diagnosis not present

## 2013-06-02 ENCOUNTER — Inpatient Hospital Stay (HOSPITAL_COMMUNITY): Admission: RE | Admit: 2013-06-02 | Payer: Medicare Other | Source: Ambulatory Visit

## 2013-06-04 DIAGNOSIS — E669 Obesity, unspecified: Secondary | ICD-10-CM | POA: Diagnosis not present

## 2013-06-04 DIAGNOSIS — I509 Heart failure, unspecified: Secondary | ICD-10-CM | POA: Diagnosis not present

## 2013-06-04 DIAGNOSIS — I10 Essential (primary) hypertension: Secondary | ICD-10-CM | POA: Diagnosis not present

## 2013-06-04 DIAGNOSIS — Z794 Long term (current) use of insulin: Secondary | ICD-10-CM | POA: Diagnosis not present

## 2013-06-04 DIAGNOSIS — E119 Type 2 diabetes mellitus without complications: Secondary | ICD-10-CM | POA: Diagnosis not present

## 2013-06-06 DIAGNOSIS — E669 Obesity, unspecified: Secondary | ICD-10-CM | POA: Diagnosis not present

## 2013-06-06 DIAGNOSIS — E119 Type 2 diabetes mellitus without complications: Secondary | ICD-10-CM | POA: Diagnosis not present

## 2013-06-06 DIAGNOSIS — I10 Essential (primary) hypertension: Secondary | ICD-10-CM | POA: Diagnosis not present

## 2013-06-06 DIAGNOSIS — Z794 Long term (current) use of insulin: Secondary | ICD-10-CM | POA: Diagnosis not present

## 2013-06-06 DIAGNOSIS — I509 Heart failure, unspecified: Secondary | ICD-10-CM | POA: Diagnosis not present

## 2013-06-07 DIAGNOSIS — H6520 Chronic serous otitis media, unspecified ear: Secondary | ICD-10-CM | POA: Diagnosis not present

## 2013-06-08 DIAGNOSIS — E119 Type 2 diabetes mellitus without complications: Secondary | ICD-10-CM | POA: Diagnosis not present

## 2013-06-08 DIAGNOSIS — Z794 Long term (current) use of insulin: Secondary | ICD-10-CM | POA: Diagnosis not present

## 2013-06-08 DIAGNOSIS — I10 Essential (primary) hypertension: Secondary | ICD-10-CM | POA: Diagnosis not present

## 2013-06-08 DIAGNOSIS — I509 Heart failure, unspecified: Secondary | ICD-10-CM | POA: Diagnosis not present

## 2013-06-08 DIAGNOSIS — E669 Obesity, unspecified: Secondary | ICD-10-CM | POA: Diagnosis not present

## 2013-06-09 ENCOUNTER — Telehealth (HOSPITAL_COMMUNITY): Payer: Self-pay | Admitting: Anesthesiology

## 2013-06-09 ENCOUNTER — Ambulatory Visit (HOSPITAL_COMMUNITY)
Admission: RE | Admit: 2013-06-09 | Discharge: 2013-06-09 | Disposition: A | Discharge status: Home / Self Care | Payer: Medicare Other | Source: Ambulatory Visit | Attending: Internal Medicine | Admitting: Internal Medicine

## 2013-06-09 ENCOUNTER — Encounter (HOSPITAL_COMMUNITY): Payer: Self-pay

## 2013-06-09 VITALS — BP 138/88 | HR 86 | Resp 20 | Wt 307.0 lb

## 2013-06-09 DIAGNOSIS — I5032 Chronic diastolic (congestive) heart failure: Secondary | ICD-10-CM | POA: Diagnosis not present

## 2013-06-09 DIAGNOSIS — N183 Chronic kidney disease, stage 3 unspecified: Secondary | ICD-10-CM | POA: Diagnosis not present

## 2013-06-09 DIAGNOSIS — I129 Hypertensive chronic kidney disease with stage 1 through stage 4 chronic kidney disease, or unspecified chronic kidney disease: Secondary | ICD-10-CM | POA: Diagnosis not present

## 2013-06-09 DIAGNOSIS — G4733 Obstructive sleep apnea (adult) (pediatric): Secondary | ICD-10-CM | POA: Diagnosis not present

## 2013-06-09 DIAGNOSIS — E119 Type 2 diabetes mellitus without complications: Secondary | ICD-10-CM | POA: Insufficient documentation

## 2013-06-09 DIAGNOSIS — Z794 Long term (current) use of insulin: Secondary | ICD-10-CM | POA: Diagnosis not present

## 2013-06-09 DIAGNOSIS — I10 Essential (primary) hypertension: Secondary | ICD-10-CM | POA: Diagnosis not present

## 2013-06-09 DIAGNOSIS — E669 Obesity, unspecified: Secondary | ICD-10-CM | POA: Diagnosis not present

## 2013-06-09 DIAGNOSIS — I2510 Atherosclerotic heart disease of native coronary artery without angina pectoris: Secondary | ICD-10-CM | POA: Insufficient documentation

## 2013-06-09 DIAGNOSIS — I509 Heart failure, unspecified: Secondary | ICD-10-CM | POA: Diagnosis not present

## 2013-06-09 LAB — BASIC METABOLIC PANEL
BUN: 45 mg/dL — ABNORMAL HIGH (ref 6–23)
CALCIUM: 9.8 mg/dL (ref 8.4–10.5)
CHLORIDE: 105 meq/L (ref 96–112)
CO2: 22 mEq/L (ref 19–32)
Creatinine, Ser: 1.98 mg/dL — ABNORMAL HIGH (ref 0.50–1.35)
GFR calc Af Amer: 35 mL/min — ABNORMAL LOW (ref >90)
GFR calc non Af Amer: 30 mL/min — ABNORMAL LOW (ref >90)
Glucose, Bld: 146 mg/dL — ABNORMAL HIGH (ref 70–99)
Potassium: 3.6 mEq/L — ABNORMAL LOW (ref 3.7–5.3)
SODIUM: 144 meq/L (ref 137–147)

## 2013-06-09 MED ORDER — TORSEMIDE 20 MG PO TABS: 60.0000 mg | ORAL_TABLET | Freq: Every day | ORAL | Status: DC

## 2013-06-09 MED ORDER — METOLAZONE 2.5 MG PO TABS: ORAL_TABLET | ORAL | Status: DC

## 2013-06-09 NOTE — Patient Instructions (Signed)
Start taking your metolazone 2.5 mg on Sundays and Wednesdays.  Continue to try and be as active as possible.  F/U 2 months  Do the following things EVERYDAY: 1) Weigh yourself in the morning before breakfast. Write it down and keep it in a log. 2) Take your medicines as prescribed 3) Eat low salt foods-Limit salt (sodium) to 2000 mg per day.  4) Stay as active as you can everyday 5) Limit all fluids for the day to less than 2 liters 6)

## 2013-06-09 NOTE — Telephone Encounter (Signed)
Reviewed BMET. Cr slightly elevated 1.98. He still has volume on board. Will restart metolazone 2.5 mg just every Sunday and not every Sunday and Wednesday. K+3.6 today and asked to take extra 20 mew of potassium. Recheck BMET at PCP appt 06/21/13 will fax request.  Junie Bame B 5:14 PM

## 2013-06-09 NOTE — Addendum Note (Signed)
Encounter addended by: Rande Brunt, NP on: 06/09/2013  5:15 PM<BR>     Documentation filed: Follow-up Section

## 2013-06-09 NOTE — Progress Notes (Signed)
Patient ID: LEWIE DEMAN, male   DOB: 09-Jun-1931, 80 y.o.   MRN: [id number]   Weight Range  370 pounds  Baseline proBNP   Cardiologist: Dr Domenic Polite. PCP: Dr [place] Crane   HPI: Mr Maffei is a 67 year with a PMH of morbid obesity, chronic diastolic heart failure EF 50% (previous EF 60-65%), HTN, SVT/PSVT/PAT, CAD, DMII, OSA-CPAP, arthritis, fibromyalgia, and hyperlipidemia.   Admitted 2/26 with HF decompensation.  He was diuresed with IV lasix and transitioned to Demadex 60 mg daily. He required short course on inpatient rehab. Discharge weight 370 pounds.   Follow up: Since last visit was admitted to the hospital for bilateral otitis media, UTI, strep pneumonia bacteremia and A/C diastolic HF. He was treated with abx and F/U with ENT. He went to [facility].   Last visit started norvasc 5mg  daily. Discharge weight 337 lbs. Feeling much better. [place] about 4 days ago, did not hit head. Denies orthopnea, CP, or edema. Wears CPAP and O2 at night. Can walk about 200 ft before getting SOB. Reports following a low salt diet and drinking less than 2L a day. Complaint with medications. Weight at home 300 lbs. Participating in PT/OT. Has not been taking metolazone.   ROS: All systems negative except as listed in HPI, PMH and Problem List.  Past Medical History  Diagnosis Date  . PSVT (paroxysmal supraventricular tachycardia)     AVNRT  . Coronary atherosclerosis of native coronary artery     Nonobstructive, LVEF 65%  . Hyperlipidemia   . Morbid obesity   . Arthritis   . Type 2 diabetes mellitus   . Vertigo   . Venous insufficiency   . Sleep apnea, obstructive   . Chronic diastolic heart failure     Current Outpatient Prescriptions  Medication Sig Dispense Refill  . acetaminophen (TYLENOL) 325 MG tablet Take 2 tablets (650 mg total) by mouth every 6 (six) hours as needed for mild pain, moderate pain or headache.      Marland Kitchen amLODipine (NORVASC) 10 MG tablet Take 10 mg by mouth daily.       Marland Kitchen aspirin 81 MG chewable tablet Chew 2 tablets (162 mg total) by mouth daily.      . calcium-vitamin D (OSCAL WITH D) 500-200 MG-UNIT per tablet Take 1 tablet by mouth 2 (two) times daily.      . cetirizine (ZYRTEC) 10 MG tablet Take 10 mg by mouth every evening.       . Cholecalciferol (D-3-5) 5000 UNITS capsule Take 5,000 Units by mouth daily.       . fluticasone (FLONASE) 50 MCG/ACT nasal spray Place 2 sprays into the nose daily.       Marland Kitchen gabapentin (NEURONTIN) 300 MG capsule Take 1 capsule (300 mg total) by mouth 2 (two) times daily.  60 capsule  4  . HYDROcodone-acetaminophen (NORCO/VICODIN) 5-325 MG per tablet Take 1-2 tablets by mouth every 6 (six) hours as needed for moderate pain.  30 tablet  0  . insulin detemir (LEVEMIR) 100 UNIT/ML injection Inject 0.15 mLs (15 Units total) into the skin at bedtime.  10 mL  12  . labetalol (NORMODYNE) 200 MG tablet Take 1 tablet (200 mg total) by mouth 2 (two) times daily.  60 tablet  1  . latanoprost (XALATAN) 0.005 % ophthalmic solution Place 1 drop into the right eye at bedtime.       . lidocaine (LIDODERM) 5 % Place 1 patch onto the skin daily as needed (for  pain). Remove & Discard patch within 12 hours or as directed by MD.      . metolazone (ZAROXOLYN) 2.5 MG tablet Take 1 tablet (2.5 mg total) by mouth 2 (two) times a week. Sunday and Wed  30 tablet  0  . montelukast (SINGULAIR) 10 MG tablet Take 10 mg by mouth at bedtime.       . Multiple Vitamin (MULTIVITAMIN) tablet Take 1 tablet by mouth daily.      Marland Kitchen nystatin (MYCOSTATIN) 100000 UNIT/ML suspension Take 5 mLs by mouth 4 (four) times daily.      . potassium chloride SA (K-DUR,KLOR-CON) 20 MEQ tablet Take 2 tablets (40 mEq total) by mouth daily.  60 tablet  0  . saccharomyces boulardii (FLORASTOR) 250 MG capsule Take 1 capsule (250 mg total) by mouth 2 (two) times daily.  60 capsule  0  . sodium chloride (OCEAN) 0.65 % SOLN nasal spray Place 1 spray into both nostrils as needed for  congestion.    0  . spironolactone (ALDACTONE) 25 MG tablet Take 1 tablet (25 mg total) by mouth daily.      . Tamsulosin HCl (FLOMAX) 0.4 MG CAPS Take 0.4 mg by mouth at bedtime.       . torsemide (DEMADEX) 20 MG tablet Take 60 mg daily. If home weight greater than 329 lbs take an extra 20 mg.  100 tablet  6  . traMADol (ULTRAM) 50 MG tablet Take 1 tablet (50 mg total) by mouth every 6 (six) hours as needed for severe pain.  30 tablet    . vitamin B-12 1000 MCG tablet Take 1 tablet (1,000 mcg total) by mouth daily.  30 tablet  0   No current facility-administered medications for this encounter.    Filed Vitals:   06/09/13 1409  BP: 138/88  Pulse: 86  Resp: 20  Weight: 307 lb (139.254 kg)  SpO2: 99%   Physical Exam:  General: Pleasant; Morbidly obese. Sitting in wheelchair.  No resp difficulty Wife present in wheelchair HEENT: normal except for blind in L eye  Neck: supple. JVP hard to see d/t body habitus but appears 8-9 Carotids 2+ bilat; no bruits. No lymphadenopathy or thryomegaly appreciated.  Cor: PMI nonpalpable. Regular rate & rhythm. No rubs, gallops or murmurs.  Lungs: clear  Abdomen: morbidly obese, soft, nontender, mildly distended. . No bruits or masses. Good bowel sounds.  Extremities: no cyanosis, clubbing, rash, bilateral 2-3+ LE edema L>R, severe chronic venous stasis changes. Bilateral TED hose  Neuro: alert & orientedx3, cranial nerves grossly intact. moves all 4 extremities w/o difficulty. Affect pleasant    ASSESSMENT & PLAN:  1) Chronic diastolic HF: EF [id number] (123456) - NYHA II symptoms. Volume status much improved since last visit, however still has a little volume on board. He was on metolazone 2.5 every Wednesday and Sunday in the past. Will restart 2.5 mg every Sunday. Continue torsemide 60 mg daily.  - Will get BMET today. - SBP controlled. - Reinforced the need and importance of daily weights, a low sodium diet, and fluid restriction (less than 2 L a  day). Instructed to call the HF clinic if weight increases more than 3 lbs overnight or 5 lbs in a week.  2) OSA - Continues to wear biPAP at night, continue. 3) HTN - SBP 120-130s at home. Continue norvasc and BB. Continue to keep record of BP, pulse and weights and bring to next visit. 4) CRI, stage III - Baseline Cr 1.5-2.0 - Will  check BMET today  F/U 2 months. Provided the patient with a pill box and bag from clinic.   Rande Brunt, NP-C 2:22 PM

## 2013-06-10 DIAGNOSIS — Z794 Long term (current) use of insulin: Secondary | ICD-10-CM | POA: Diagnosis not present

## 2013-06-10 DIAGNOSIS — E669 Obesity, unspecified: Secondary | ICD-10-CM | POA: Diagnosis not present

## 2013-06-10 DIAGNOSIS — I509 Heart failure, unspecified: Secondary | ICD-10-CM | POA: Diagnosis not present

## 2013-06-10 DIAGNOSIS — E119 Type 2 diabetes mellitus without complications: Secondary | ICD-10-CM | POA: Diagnosis not present

## 2013-06-10 DIAGNOSIS — I10 Essential (primary) hypertension: Secondary | ICD-10-CM | POA: Diagnosis not present

## 2013-06-13 DIAGNOSIS — Z794 Long term (current) use of insulin: Secondary | ICD-10-CM | POA: Diagnosis not present

## 2013-06-13 DIAGNOSIS — I509 Heart failure, unspecified: Secondary | ICD-10-CM | POA: Diagnosis not present

## 2013-06-13 DIAGNOSIS — I10 Essential (primary) hypertension: Secondary | ICD-10-CM | POA: Diagnosis not present

## 2013-06-13 DIAGNOSIS — E119 Type 2 diabetes mellitus without complications: Secondary | ICD-10-CM | POA: Diagnosis not present

## 2013-06-13 DIAGNOSIS — E669 Obesity, unspecified: Secondary | ICD-10-CM | POA: Diagnosis not present

## 2013-06-15 ENCOUNTER — Ambulatory Visit: Payer: Medicare Other | Admitting: Cardiology

## 2013-06-15 DIAGNOSIS — E119 Type 2 diabetes mellitus without complications: Secondary | ICD-10-CM | POA: Diagnosis not present

## 2013-06-15 DIAGNOSIS — I10 Essential (primary) hypertension: Secondary | ICD-10-CM | POA: Diagnosis not present

## 2013-06-15 DIAGNOSIS — Z794 Long term (current) use of insulin: Secondary | ICD-10-CM | POA: Diagnosis not present

## 2013-06-15 DIAGNOSIS — E669 Obesity, unspecified: Secondary | ICD-10-CM | POA: Diagnosis not present

## 2013-06-15 DIAGNOSIS — I509 Heart failure, unspecified: Secondary | ICD-10-CM | POA: Diagnosis not present

## 2013-06-16 DIAGNOSIS — E119 Type 2 diabetes mellitus without complications: Secondary | ICD-10-CM | POA: Diagnosis not present

## 2013-06-16 DIAGNOSIS — I509 Heart failure, unspecified: Secondary | ICD-10-CM | POA: Diagnosis not present

## 2013-06-16 DIAGNOSIS — I10 Essential (primary) hypertension: Secondary | ICD-10-CM | POA: Diagnosis not present

## 2013-06-16 DIAGNOSIS — E669 Obesity, unspecified: Secondary | ICD-10-CM | POA: Diagnosis not present

## 2013-06-16 DIAGNOSIS — Z794 Long term (current) use of insulin: Secondary | ICD-10-CM | POA: Diagnosis not present

## 2013-06-17 DIAGNOSIS — I10 Essential (primary) hypertension: Secondary | ICD-10-CM | POA: Diagnosis not present

## 2013-06-17 DIAGNOSIS — E119 Type 2 diabetes mellitus without complications: Secondary | ICD-10-CM | POA: Diagnosis not present

## 2013-06-17 DIAGNOSIS — Z794 Long term (current) use of insulin: Secondary | ICD-10-CM | POA: Diagnosis not present

## 2013-06-17 DIAGNOSIS — E669 Obesity, unspecified: Secondary | ICD-10-CM | POA: Diagnosis not present

## 2013-06-17 DIAGNOSIS — I509 Heart failure, unspecified: Secondary | ICD-10-CM | POA: Diagnosis not present

## 2013-06-20 DIAGNOSIS — I10 Essential (primary) hypertension: Secondary | ICD-10-CM | POA: Diagnosis not present

## 2013-06-20 DIAGNOSIS — E119 Type 2 diabetes mellitus without complications: Secondary | ICD-10-CM | POA: Diagnosis not present

## 2013-06-20 DIAGNOSIS — E669 Obesity, unspecified: Secondary | ICD-10-CM | POA: Diagnosis not present

## 2013-06-20 DIAGNOSIS — I509 Heart failure, unspecified: Secondary | ICD-10-CM | POA: Diagnosis not present

## 2013-06-20 DIAGNOSIS — Z794 Long term (current) use of insulin: Secondary | ICD-10-CM | POA: Diagnosis not present

## 2013-06-20 DIAGNOSIS — E785 Hyperlipidemia, unspecified: Secondary | ICD-10-CM | POA: Diagnosis not present

## 2013-06-21 DIAGNOSIS — E119 Type 2 diabetes mellitus without complications: Secondary | ICD-10-CM | POA: Diagnosis not present

## 2013-06-21 DIAGNOSIS — I10 Essential (primary) hypertension: Secondary | ICD-10-CM | POA: Diagnosis not present

## 2013-06-21 DIAGNOSIS — Z794 Long term (current) use of insulin: Secondary | ICD-10-CM | POA: Diagnosis not present

## 2013-06-21 DIAGNOSIS — E669 Obesity, unspecified: Secondary | ICD-10-CM | POA: Diagnosis not present

## 2013-06-21 DIAGNOSIS — I509 Heart failure, unspecified: Secondary | ICD-10-CM | POA: Diagnosis not present

## 2013-06-22 DIAGNOSIS — E669 Obesity, unspecified: Secondary | ICD-10-CM | POA: Diagnosis not present

## 2013-06-22 DIAGNOSIS — I509 Heart failure, unspecified: Secondary | ICD-10-CM | POA: Diagnosis not present

## 2013-06-22 DIAGNOSIS — I10 Essential (primary) hypertension: Secondary | ICD-10-CM | POA: Diagnosis not present

## 2013-06-22 DIAGNOSIS — E119 Type 2 diabetes mellitus without complications: Secondary | ICD-10-CM | POA: Diagnosis not present

## 2013-06-22 DIAGNOSIS — Z794 Long term (current) use of insulin: Secondary | ICD-10-CM | POA: Diagnosis not present

## 2013-06-23 DIAGNOSIS — E669 Obesity, unspecified: Secondary | ICD-10-CM | POA: Diagnosis not present

## 2013-06-23 DIAGNOSIS — Z794 Long term (current) use of insulin: Secondary | ICD-10-CM | POA: Diagnosis not present

## 2013-06-23 DIAGNOSIS — I509 Heart failure, unspecified: Secondary | ICD-10-CM | POA: Diagnosis not present

## 2013-06-23 DIAGNOSIS — E119 Type 2 diabetes mellitus without complications: Secondary | ICD-10-CM | POA: Diagnosis not present

## 2013-06-23 DIAGNOSIS — I10 Essential (primary) hypertension: Secondary | ICD-10-CM | POA: Diagnosis not present

## 2013-06-27 ENCOUNTER — Telehealth (HOSPITAL_COMMUNITY): Payer: Self-pay | Admitting: Cardiology

## 2013-06-27 DIAGNOSIS — E1149 Type 2 diabetes mellitus with other diabetic neurological complication: Secondary | ICD-10-CM | POA: Diagnosis not present

## 2013-06-27 DIAGNOSIS — Z794 Long term (current) use of insulin: Secondary | ICD-10-CM | POA: Diagnosis not present

## 2013-06-27 DIAGNOSIS — E119 Type 2 diabetes mellitus without complications: Secondary | ICD-10-CM | POA: Diagnosis not present

## 2013-06-27 DIAGNOSIS — E669 Obesity, unspecified: Secondary | ICD-10-CM | POA: Diagnosis not present

## 2013-06-27 DIAGNOSIS — I509 Heart failure, unspecified: Secondary | ICD-10-CM | POA: Diagnosis not present

## 2013-06-27 DIAGNOSIS — I10 Essential (primary) hypertension: Secondary | ICD-10-CM | POA: Diagnosis not present

## 2013-06-27 NOTE — Telephone Encounter (Signed)
Spoke w/pt, he states he does weigh himself everyday and wt is up although he couldn't tell me exact numbers, he states he had forgotten about Metolazone so that's why he hasn't been taking it but he will take it in the AM, he will continue to monitor wt and call if not coming back down.  We also discussed salt restrictions, he states his daughter brought him lasagna over the weekend, we again discussed importance of watching and limiting salt in his diet, will call and discuss with Almyra Free tomorrow

## 2013-06-27 NOTE — Telephone Encounter (Signed)
Almyra Free and [facility] nurse are having a very difficult time managing pt. He will not weigh, when he does will not let nurse see numbers as he goes into the bathroom and closes the door. Will not restrict diet and fluids Has not taken metolazone x 2 weeks Please advise

## 2013-06-28 DIAGNOSIS — I10 Essential (primary) hypertension: Secondary | ICD-10-CM | POA: Diagnosis not present

## 2013-06-28 DIAGNOSIS — Z794 Long term (current) use of insulin: Secondary | ICD-10-CM | POA: Diagnosis not present

## 2013-06-28 DIAGNOSIS — I509 Heart failure, unspecified: Secondary | ICD-10-CM | POA: Diagnosis not present

## 2013-06-28 DIAGNOSIS — E669 Obesity, unspecified: Secondary | ICD-10-CM | POA: Diagnosis not present

## 2013-06-28 DIAGNOSIS — E119 Type 2 diabetes mellitus without complications: Secondary | ICD-10-CM | POA: Diagnosis not present

## 2013-06-28 NOTE — Telephone Encounter (Signed)
Left message for Julie to call back.  

## 2013-06-29 NOTE — Telephone Encounter (Signed)
Spoke w/Julie, we will both continue to f/u with pt via phone to check on him and reinforce med and diet compliance

## 2013-06-30 DIAGNOSIS — I10 Essential (primary) hypertension: Secondary | ICD-10-CM | POA: Diagnosis not present

## 2013-06-30 DIAGNOSIS — I509 Heart failure, unspecified: Secondary | ICD-10-CM | POA: Diagnosis not present

## 2013-06-30 DIAGNOSIS — E119 Type 2 diabetes mellitus without complications: Secondary | ICD-10-CM | POA: Diagnosis not present

## 2013-06-30 DIAGNOSIS — E669 Obesity, unspecified: Secondary | ICD-10-CM | POA: Diagnosis not present

## 2013-06-30 DIAGNOSIS — Z794 Long term (current) use of insulin: Secondary | ICD-10-CM | POA: Diagnosis not present

## 2013-07-01 DIAGNOSIS — E785 Hyperlipidemia, unspecified: Secondary | ICD-10-CM | POA: Diagnosis not present

## 2013-07-01 DIAGNOSIS — I10 Essential (primary) hypertension: Secondary | ICD-10-CM | POA: Diagnosis not present

## 2013-07-01 DIAGNOSIS — G4733 Obstructive sleep apnea (adult) (pediatric): Secondary | ICD-10-CM | POA: Diagnosis not present

## 2013-07-01 DIAGNOSIS — E119 Type 2 diabetes mellitus without complications: Secondary | ICD-10-CM | POA: Diagnosis not present

## 2013-07-04 DIAGNOSIS — I10 Essential (primary) hypertension: Secondary | ICD-10-CM | POA: Diagnosis not present

## 2013-07-04 DIAGNOSIS — I509 Heart failure, unspecified: Secondary | ICD-10-CM | POA: Diagnosis not present

## 2013-07-04 DIAGNOSIS — E119 Type 2 diabetes mellitus without complications: Secondary | ICD-10-CM | POA: Diagnosis not present

## 2013-07-04 DIAGNOSIS — E669 Obesity, unspecified: Secondary | ICD-10-CM | POA: Diagnosis not present

## 2013-07-04 DIAGNOSIS — Z794 Long term (current) use of insulin: Secondary | ICD-10-CM | POA: Diagnosis not present

## 2013-07-07 ENCOUNTER — Ambulatory Visit: Payer: Medicare Other | Admitting: Cardiology

## 2013-07-07 DIAGNOSIS — E669 Obesity, unspecified: Secondary | ICD-10-CM | POA: Diagnosis not present

## 2013-07-07 DIAGNOSIS — E119 Type 2 diabetes mellitus without complications: Secondary | ICD-10-CM | POA: Diagnosis not present

## 2013-07-07 DIAGNOSIS — I509 Heart failure, unspecified: Secondary | ICD-10-CM | POA: Diagnosis not present

## 2013-07-07 DIAGNOSIS — Z794 Long term (current) use of insulin: Secondary | ICD-10-CM | POA: Diagnosis not present

## 2013-07-07 DIAGNOSIS — I10 Essential (primary) hypertension: Secondary | ICD-10-CM | POA: Diagnosis not present

## 2013-07-12 DIAGNOSIS — F410 Panic disorder [episodic paroxysmal anxiety] without agoraphobia: Secondary | ICD-10-CM | POA: Diagnosis not present

## 2013-07-12 DIAGNOSIS — G4700 Insomnia, unspecified: Secondary | ICD-10-CM | POA: Diagnosis not present

## 2013-07-13 DIAGNOSIS — E119 Type 2 diabetes mellitus without complications: Secondary | ICD-10-CM | POA: Diagnosis not present

## 2013-07-13 DIAGNOSIS — I509 Heart failure, unspecified: Secondary | ICD-10-CM | POA: Diagnosis not present

## 2013-07-13 DIAGNOSIS — I10 Essential (primary) hypertension: Secondary | ICD-10-CM | POA: Diagnosis not present

## 2013-07-13 DIAGNOSIS — Z794 Long term (current) use of insulin: Secondary | ICD-10-CM | POA: Diagnosis not present

## 2013-07-13 DIAGNOSIS — E669 Obesity, unspecified: Secondary | ICD-10-CM | POA: Diagnosis not present

## 2013-08-01 ENCOUNTER — Other Ambulatory Visit (HOSPITAL_COMMUNITY): Payer: Self-pay

## 2013-08-01 MED ORDER — AMLODIPINE BESYLATE 10 MG PO TABS: 10.0000 mg | ORAL_TABLET | Freq: Every day | ORAL | Status: DC

## 2013-08-02 DIAGNOSIS — Z794 Long term (current) use of insulin: Secondary | ICD-10-CM | POA: Diagnosis not present

## 2013-08-02 DIAGNOSIS — I10 Essential (primary) hypertension: Secondary | ICD-10-CM | POA: Diagnosis not present

## 2013-08-02 DIAGNOSIS — I509 Heart failure, unspecified: Secondary | ICD-10-CM | POA: Diagnosis not present

## 2013-08-02 DIAGNOSIS — E119 Type 2 diabetes mellitus without complications: Secondary | ICD-10-CM | POA: Diagnosis not present

## 2013-08-02 DIAGNOSIS — E669 Obesity, unspecified: Secondary | ICD-10-CM | POA: Diagnosis not present

## 2013-08-04 ENCOUNTER — Encounter: Payer: Self-pay | Admitting: Cardiology

## 2013-08-04 ENCOUNTER — Ambulatory Visit (INDEPENDENT_AMBULATORY_CARE_PROVIDER_SITE_OTHER): Payer: Medicare Other | Admitting: Cardiology

## 2013-08-04 VITALS — BP 120/62 | HR 60 | Ht 72.0 in | Wt 329.4 lb

## 2013-08-04 DIAGNOSIS — I2510 Atherosclerotic heart disease of native coronary artery without angina pectoris: Secondary | ICD-10-CM

## 2013-08-04 DIAGNOSIS — I471 Supraventricular tachycardia: Secondary | ICD-10-CM | POA: Diagnosis not present

## 2013-08-04 DIAGNOSIS — N183 Chronic kidney disease, stage 3 unspecified: Secondary | ICD-10-CM | POA: Diagnosis not present

## 2013-08-04 DIAGNOSIS — I5033 Acute on chronic diastolic (congestive) heart failure: Secondary | ICD-10-CM

## 2013-08-04 MED ORDER — METOLAZONE 2.5 MG PO TABS: ORAL_TABLET | ORAL | Status: DC

## 2013-08-04 MED ORDER — TORSEMIDE 20 MG PO TABS: 80.0000 mg | ORAL_TABLET | Freq: Every day | ORAL | Status: DC

## 2013-08-04 NOTE — Assessment & Plan Note (Signed)
Weight has been climbing. He did not bring his medications in today for review, and I note some recent concerns about regular diuretic usage, also his diet. I reviewed his medications again today. He will be seen in the CHF clinic next week with BMET. In the interim plan to increase metolazone to 2.5 mg twice a week, increase Demadex to 80 mg daily.

## 2013-08-04 NOTE — Patient Instructions (Signed)
Your physician recommends that you schedule a follow-up appointment in: 2 months. Your physician has recommended you make the following change in your medication:  Increase your torsemide to 80 mg daily. Take your zaroxolyn 2.5 mg tablet twice per week. All other medications will remain the same.  Please have lab work done next week to check your BMET.

## 2013-08-04 NOTE — Assessment & Plan Note (Signed)
No angina symptoms with history of nonobstructive disease. 

## 2013-08-04 NOTE — Progress Notes (Signed)
Clinical Summary [name] is an 80 y.o.male last seen in January. Interval visit in the CHF clinic back in February. I reviewed the chart including recent telephone notes, appears that there has been some concern about his medications and also weights per home health nurse. Looks like he was not taking metolazone. I asked him about this and he says that he has been taking his metolazone once a week recently.  By records, weight was 307 pounds in February, today is up to 329 pounds. Home weight record shows increase from 316 up to 324. Lab work from February showed potassium 3.6, BUN 45, creatinine 1.9.  Today we discussed his diet, he mainly complained of being very functionally limited due to back pain and the weather. Uses a walker. He did not bring his medicines in for the visit.  Very limited echocardiogram from December 19 reported overall normal LVEF, no assessment of wall motion or diastolic function, left atrium described as moderately dilated, PASP 54 mm mercury.   Allergies  Allergen Reactions  . Ambien [Zolpidem Tartrate]     Makes him cuss like a sailor  . Lyrica [Pregabalin]     Causes him to have fluid  . Penicillins Hives    nightmares  . Piroxicam     unknown  . Sulfonamide Derivatives     Night sweats    Current Outpatient Prescriptions  Medication Sig Dispense Refill  . acetaminophen (TYLENOL) 325 MG tablet Take 2 tablets (650 mg total) by mouth every 6 (six) hours as needed for mild pain, moderate pain or headache.      Marland Kitchen amLODipine (NORVASC) 10 MG tablet Take 1 tablet (10 mg total) by mouth daily.  30 tablet  3  . aspirin 81 MG chewable tablet Chew 2 tablets (162 mg total) by mouth daily.      . calcium-vitamin D (OSCAL WITH D) 500-200 MG-UNIT per tablet Take 1 tablet by mouth 2 (two) times daily.      . cetirizine (ZYRTEC) 10 MG tablet Take 10 mg by mouth every evening.       . Cholecalciferol (D-3-5) 5000 UNITS capsule Take 5,000 Units by mouth daily.         . fluticasone (FLONASE) 50 MCG/ACT nasal spray Place 2 sprays into the nose daily.       Marland Kitchen gabapentin (NEURONTIN) 300 MG capsule Take 1 capsule (300 mg total) by mouth 2 (two) times daily.  60 capsule  4  . HYDROcodone-acetaminophen (NORCO/VICODIN) 5-325 MG per tablet Take 1-2 tablets by mouth every 6 (six) hours as needed for moderate pain.  30 tablet  0  . insulin detemir (LEVEMIR) 100 UNIT/ML injection Inject 0.15 mLs (15 Units total) into the skin at bedtime.  10 mL  12  . labetalol (NORMODYNE) 200 MG tablet Take 1 tablet (200 mg total) by mouth 2 (two) times daily.  60 tablet  1  . latanoprost (XALATAN) 0.005 % ophthalmic solution Place 1 drop into the right eye at bedtime.       . lidocaine (LIDODERM) 5 % Place 1 patch onto the skin daily as needed (for pain). Remove & Discard patch within 12 hours or as directed by MD.      . metolazone (ZAROXOLYN) 2.5 MG tablet Take 2.5 mg twice per week  8 tablet  1  . montelukast (SINGULAIR) 10 MG tablet Take 10 mg by mouth at bedtime.       . Multiple Vitamin (MULTIVITAMIN) tablet Take 1 tablet  by mouth daily.      Marland Kitchen nystatin (MYCOSTATIN) 100000 UNIT/ML suspension Take 5 mLs by mouth 4 (four) times daily.      . potassium chloride SA (K-DUR,KLOR-CON) 20 MEQ tablet Take 2 tablets (40 mEq total) by mouth daily.  60 tablet  0  . saccharomyces boulardii (FLORASTOR) 250 MG capsule Take 1 capsule (250 mg total) by mouth 2 (two) times daily.  60 capsule  0  . sodium chloride (OCEAN) 0.65 % SOLN nasal spray Place 1 spray into both nostrils as needed for congestion.    0  . spironolactone (ALDACTONE) 25 MG tablet Take 1 tablet (25 mg total) by mouth daily.      . Tamsulosin HCl (FLOMAX) 0.4 MG CAPS Take 0.4 mg by mouth at bedtime.       . torsemide (DEMADEX) 20 MG tablet Take 4 tablets (80 mg total) by mouth daily. Take 60 mg daily.  120 tablet  3  . traMADol (ULTRAM) 50 MG tablet Take 1 tablet (50 mg total) by mouth every 6 (six) hours as needed for severe  pain.  30 tablet    . vitamin B-12 1000 MCG tablet Take 1 tablet (1,000 mcg total) by mouth daily.  30 tablet  0   No current facility-administered medications for this visit.    Past Medical History  Diagnosis Date  . PSVT (paroxysmal supraventricular tachycardia)     AVNRT  . Coronary atherosclerosis of native coronary artery     Nonobstructive, LVEF 65%  . Hyperlipidemia   . Morbid obesity   . Arthritis   . Type 2 diabetes mellitus   . Vertigo   . Venous insufficiency   . Sleep apnea, obstructive   . Chronic diastolic heart failure     Social History Mr. Stroot reports that he has never smoked. He has never used smokeless tobacco. Mr. Lowery reports that he does not drink alcohol.  Review of Systems Negative except as outlined.  Physical Examination Filed Vitals:   08/04/13 1429  BP: 120/62  Pulse: 60   Filed Weights   08/04/13 1429  Weight: 329 lb 6.4 oz (149.415 kg)    Morbidly obese, no distress.  HEENT: Conjunctiva and lids normal, oropharynx clear.  Neck: Supple, increased girth, difficult to appreciate JVP, no carotid bruits, no thyromegaly.  Lungs: Clear to auscultation, decreased at bases, nonlabored breathing at rest.  Cardiac: Regular rate and rhythm, no S3, 2/6 systolic murmur, no pericardial rub.  Abdomen: Soft, nontender, obesey, bowel sounds present.  Extremities: Chronic appearing edema and lymphedema, compression hose on.  Skin: Warm and dry.  Musculoskeletal: No kyphosis.  Neuropsychiatric: Alert and oriented x3, affect grossly appropriate.   Problem List and Plan   Acute on chronic diastolic heart failure Weight has been climbing. He did not bring his medications in today for review, and I note some recent concerns about regular diuretic usage, also his diet. I reviewed his medications again today. He will be seen in the CHF clinic next week with BMET. In the interim plan to increase metolazone to 2.5 mg twice a week, increase Demadex to 80 mg  daily.  CAD, NATIVE VESSEL No angina symptoms with history of nonobstructive disease.  CKD (chronic kidney disease) stage 3, GFR 30-59 ml/min Most recent creatinine 1.9.  SVT/ PSVT/ PAT This has been quiescent.    Satira Sark, M.D., F.A.C.C.

## 2013-08-04 NOTE — Assessment & Plan Note (Signed)
Most recent creatinine 1.9.

## 2013-08-04 NOTE — Assessment & Plan Note (Signed)
This has been quiescent 

## 2013-08-08 ENCOUNTER — Ambulatory Visit (HOSPITAL_COMMUNITY)
Admission: RE | Admit: 2013-08-08 | Discharge: 2013-08-08 | Disposition: A | Discharge status: Home / Self Care | Payer: Medicare Other | Source: Ambulatory Visit | Attending: Internal Medicine | Admitting: Internal Medicine

## 2013-08-08 ENCOUNTER — Telehealth (HOSPITAL_COMMUNITY): Payer: Self-pay

## 2013-08-08 VITALS — BP 134/69 | HR 83 | Wt 329.0 lb

## 2013-08-08 DIAGNOSIS — I5032 Chronic diastolic (congestive) heart failure: Secondary | ICD-10-CM | POA: Diagnosis not present

## 2013-08-08 DIAGNOSIS — N183 Chronic kidney disease, stage 3 unspecified: Secondary | ICD-10-CM | POA: Diagnosis not present

## 2013-08-08 DIAGNOSIS — I129 Hypertensive chronic kidney disease with stage 1 through stage 4 chronic kidney disease, or unspecified chronic kidney disease: Secondary | ICD-10-CM | POA: Insufficient documentation

## 2013-08-08 DIAGNOSIS — I10 Essential (primary) hypertension: Secondary | ICD-10-CM

## 2013-08-08 LAB — BASIC METABOLIC PANEL
BUN: 38 mg/dL — ABNORMAL HIGH (ref 6–23)
CO2: 24 mEq/L (ref 19–32)
Calcium: 9.7 mg/dL (ref 8.4–10.5)
Chloride: 100 mEq/L (ref 96–112)
Creatinine, Ser: 1.83 mg/dL — ABNORMAL HIGH (ref 0.50–1.35)
GFR calc Af Amer: 38 mL/min — ABNORMAL LOW (ref >90)
GFR, EST NON AFRICAN AMERICAN: 33 mL/min — AB (ref >90)
Glucose, Bld: 159 mg/dL — ABNORMAL HIGH (ref 70–99)
POTASSIUM: 3.5 meq/L — AB (ref 3.7–5.3)
SODIUM: 142 meq/L (ref 137–147)

## 2013-08-08 NOTE — Patient Instructions (Signed)
Continue medications   Try to increase your activity.  F/U 6 weeks  Do the following things EVERYDAY: 1) Weigh yourself in the morning before breakfast. Write it down and keep it in a log. 2) Take your medicines as prescribed 3) Eat low salt foods-Limit salt (sodium) to 2000 mg per day.  4) Stay as active as you can everyday 5) Limit all fluids for the day to less than 2 liters 6)

## 2013-08-08 NOTE — Telephone Encounter (Signed)
Patient instructed to take extra 51meq potassium today per Junie Bame NP Renee Pain

## 2013-08-08 NOTE — Progress Notes (Signed)
Patient ID: [name], male   DOB: 04-23-1932, 80 y.o.   MRN: [id number]   Weight Range  370 pounds  Baseline proBNP   Cardiologist: Dr Domenic Polite. PCP: Dr Nevada Crane   HPI: [name] is a 50 year with a PMH of morbid obesity, chronic diastolic heart failure EF 50% (previous EF 60-65%), HTN, SVT/PSVT/PAT, CAD, DMII, OSA-CPAP, arthritis, fibromyalgia, and hyperlipidemia.   Admitted 2/26 with HF decompensation.  He was diuresed with IV lasix and transitioned to Demadex 60 mg daily. He required short course on inpatient rehab. Discharge weight 370 pounds.   Follow up: Since last visit saw Dr. Domenic Polite with increased weight and his torsemide was increased to 80 mg daily and increased metolazone to 2.5 mg every on Sundays and Wednesdays. Denies SOB, orthopnea, CP or edema. Wears CPAP and O2 at night. Can walk about 200-300 ft before getting SOB. Not following a strict low sodium diet and trying to drink less than 2L a day. Weight at home 320-325 lbs. SBP at home 120-150/70-80s. Has not taken medications yet this am.  ROS: All systems negative except as listed in HPI, PMH and Problem List.  Past Medical History  Diagnosis Date  . PSVT (paroxysmal supraventricular tachycardia)     AVNRT  . Coronary atherosclerosis of native coronary artery     Nonobstructive, LVEF 65%  . Hyperlipidemia   . Morbid obesity   . Arthritis   . Type 2 diabetes mellitus   . Vertigo   . Venous insufficiency   . Sleep apnea, obstructive   . Chronic diastolic heart failure     Current Outpatient Prescriptions  Medication Sig Dispense Refill  . acetaminophen (TYLENOL) 325 MG tablet Take 2 tablets (650 mg total) by mouth every 6 (six) hours as needed for mild pain, moderate pain or headache.      Marland Kitchen amLODipine (NORVASC) 10 MG tablet Take 1 tablet (10 mg total) by mouth daily.  30 tablet  3  . aspirin 81 MG chewable tablet Chew 2 tablets (162 mg total) by mouth daily.      . calcium-vitamin D (OSCAL WITH D) 500-200  MG-UNIT per tablet Take 1 tablet by mouth 2 (two) times daily.      . cetirizine (ZYRTEC) 10 MG tablet Take 10 mg by mouth every evening.       . Cholecalciferol (D-3-5) 5000 UNITS capsule Take 5,000 Units by mouth daily.       . fluticasone (FLONASE) 50 MCG/ACT nasal spray Place 2 sprays into the nose daily.       Marland Kitchen gabapentin (NEURONTIN) 300 MG capsule Take 1 capsule (300 mg total) by mouth 2 (two) times daily.  60 capsule  4  . HYDROcodone-acetaminophen (NORCO/VICODIN) 5-325 MG per tablet Take 1-2 tablets by mouth every 6 (six) hours as needed for moderate pain.  30 tablet  0  . insulin detemir (LEVEMIR) 100 UNIT/ML injection Inject 0.15 mLs (15 Units total) into the skin at bedtime.  10 mL  12  . labetalol (NORMODYNE) 200 MG tablet Take 1 tablet (200 mg total) by mouth 2 (two) times daily.  60 tablet  1  . latanoprost (XALATAN) 0.005 % ophthalmic solution Place 1 drop into the right eye at bedtime.       . lidocaine (LIDODERM) 5 % Place 1 patch onto the skin daily as needed (for pain). Remove & Discard patch within 12 hours or as directed by MD.      . metolazone (ZAROXOLYN) 2.5 MG tablet  2.5 mg. Take 2.5 mg on Sundays and Thursdays      . montelukast (SINGULAIR) 10 MG tablet Take 10 mg by mouth at bedtime.       . Multiple Vitamin (MULTIVITAMIN) tablet Take 1 tablet by mouth daily.      Marland Kitchen nystatin (MYCOSTATIN) 100000 UNIT/ML suspension Take 5 mLs by mouth 4 (four) times daily.      . potassium chloride SA (K-DUR,KLOR-CON) 20 MEQ tablet Take 2 tablets (40 mEq total) by mouth daily.  60 tablet  0  . saccharomyces boulardii (FLORASTOR) 250 MG capsule Take 1 capsule (250 mg total) by mouth 2 (two) times daily.  60 capsule  0  . sodium chloride (OCEAN) 0.65 % SOLN nasal spray Place 1 spray into both nostrils as needed for congestion.    0  . spironolactone (ALDACTONE) 25 MG tablet Take 1 tablet (25 mg total) by mouth daily.      . Tamsulosin HCl (FLOMAX) 0.4 MG CAPS Take 0.4 mg by mouth at bedtime.        . torsemide (DEMADEX) 20 MG tablet Take 80 mg by mouth daily.      . traMADol (ULTRAM) 50 MG tablet Take 1 tablet (50 mg total) by mouth every 6 (six) hours as needed for severe pain.  30 tablet    . vitamin B-12 1000 MCG tablet Take 1 tablet (1,000 mcg total) by mouth daily.  30 tablet  0   No current facility-administered medications for this encounter.    Filed Vitals:   08/08/13 1108  BP: 134/69  Pulse: 83  Weight: 329 lb (149.233 kg)  SpO2: 97%   Physical Exam:  General: Pleasant; Morbidly obese. Sitting in wheelchair.  No resp difficulty [name] present in wheelchair HEENT: normal except for blind in L eye  Neck: supple. JVP hard to see d/t body habitus but appears 8-9 Carotids 2+ bilat; no bruits. No lymphadenopathy or thryomegaly appreciated.  Cor: PMI nonpalpable. Regular rate & rhythm. No rubs, gallops or murmurs.  Lungs: clear  Abdomen: morbidly obese, soft, nontender, mildly distended. . No bruits or masses. Good bowel sounds.  Extremities: no cyanosis, clubbing, rash, bilateral 2-3+ LE edema L>R, severe chronic venous stasis changes. Bilateral TED hose  Neuro: alert & orientedx3, cranial nerves grossly intact. moves all 4 extremities w/o difficulty. Affect pleasant    ASSESSMENT & PLAN:  1) Chronic diastolic HF: EF 37% (01/6268) - NYHA II symptoms. Volume status slightly elevated, however just had diuretics increased. Will continue torsemide 80 mg daily and metolazone 2.5 mg every Sunday and Wednesday. Check BMET today - SBP slightly elevated, but did not take medications. Have asked to start taking before appt. - Reinforced the need and importance of daily weights, a low sodium diet, and fluid restriction (less than 2 L a day). Instructed to call the HF clinic if weight increases more than 3 lbs overnight or 5 lbs in a week.  2) OSA - Continues to wear biPAP at night and O2 at night continue. 3) HTN - SBP 120-150s at home. Continue norvasc and BB. Continue to keep  record of BP, pulse and weights and bring to next visit. If remains elevated will need to discuss whether starting another agent. 4) CRI, stage III - Baseline Cr 1.5-2.0 - Will check BMET today  F/U July (seeing Dr. Domenic Polite in June)  Rande Brunt, NP-C 11:15 AM

## 2013-08-09 ENCOUNTER — Telehealth (HOSPITAL_COMMUNITY): Payer: Self-pay

## 2013-08-09 NOTE — Telephone Encounter (Signed)
Patient made aware of lab results, instructed to take extra potassium today. [name]

## 2013-09-05 DIAGNOSIS — E119 Type 2 diabetes mellitus without complications: Secondary | ICD-10-CM | POA: Diagnosis not present

## 2013-09-05 DIAGNOSIS — E1149 Type 2 diabetes mellitus with other diabetic neurological complication: Secondary | ICD-10-CM | POA: Diagnosis not present

## 2013-09-08 DIAGNOSIS — H40059 Ocular hypertension, unspecified eye: Secondary | ICD-10-CM | POA: Diagnosis not present

## 2013-09-08 DIAGNOSIS — H2510 Age-related nuclear cataract, unspecified eye: Secondary | ICD-10-CM | POA: Diagnosis not present

## 2013-09-08 DIAGNOSIS — H25019 Cortical age-related cataract, unspecified eye: Secondary | ICD-10-CM | POA: Diagnosis not present

## 2013-09-13 ENCOUNTER — Other Ambulatory Visit (HOSPITAL_COMMUNITY): Payer: Self-pay | Admitting: Cardiology

## 2013-09-13 DIAGNOSIS — I509 Heart failure, unspecified: Secondary | ICD-10-CM

## 2013-09-13 MED ORDER — METOLAZONE 2.5 MG PO TABS: ORAL_TABLET | ORAL | Status: DC

## 2013-09-28 ENCOUNTER — Encounter: Payer: Self-pay | Admitting: Cardiology

## 2013-09-28 NOTE — Progress Notes (Signed)
Clinical Summary [name] is an 80 y.o.male last seen in April. At that time Demadex was increased to 80 mg daily along with metolazone 2.5 mg twice a week. He had interval followup in the CHF clinic, medications were not changed. Weight by his home scales shows a decrease from 337 pounds in May down to 320 pounds today. Our scales do not reflect this change however. He reports no change in symptoms.  Lab work in April 13 showed potassium 3.5, BUN 38, creatinine 1.8 which was relatively stable. Followup lab work is arranged through the CHF clinic.  Very limited echocardiogram from December 19 reported overall normal LVEF, no assessment of wall motion or diastolic function, left atrium described as moderately dilated, PASP 54 mm mercury.   Allergies  Allergen Reactions  . Ambien [Zolpidem Tartrate]     Makes him cuss like a sailor  . Lyrica [Pregabalin]     Causes him to have fluid  . Penicillins Hives    nightmares  . Piroxicam     unknown  . Sulfonamide Derivatives     Night sweats    Current Outpatient Prescriptions  Medication Sig Dispense Refill  . acetaminophen (TYLENOL) 325 MG tablet Take 2 tablets (650 mg total) by mouth every 6 (six) hours as needed for mild pain, moderate pain or headache.      Marland Kitchen amLODipine (NORVASC) 10 MG tablet Take 1 tablet (10 mg total) by mouth daily.  30 tablet  3  . aspirin 81 MG chewable tablet Chew 2 tablets (162 mg total) by mouth daily.      . calcium-vitamin D (OSCAL WITH D) 500-200 MG-UNIT per tablet Take 1 tablet by mouth 2 (two) times daily.      . cetirizine (ZYRTEC) 10 MG tablet Take 10 mg by mouth every evening.       . Cholecalciferol (D-3-5) 5000 UNITS capsule Take 5,000 Units by mouth daily.       . fluticasone (FLONASE) 50 MCG/ACT nasal spray Place 2 sprays into the nose daily.       Marland Kitchen gabapentin (NEURONTIN) 300 MG capsule Take 1 capsule (300 mg total) by mouth 2 (two) times daily.  60 capsule  4  . HYDROcodone-acetaminophen  (NORCO/VICODIN) 5-325 MG per tablet Take 1-2 tablets by mouth every 6 (six) hours as needed for moderate pain.  30 tablet  0  . insulin detemir (LEVEMIR) 100 UNIT/ML injection Inject 0.15 mLs (15 Units total) into the skin at bedtime.  10 mL  12  . labetalol (NORMODYNE) 200 MG tablet Take 1 tablet (200 mg total) by mouth 2 (two) times daily.  60 tablet  1  . latanoprost (XALATAN) 0.005 % ophthalmic solution Place 1 drop into the right eye at bedtime.       . lidocaine (LIDODERM) 5 % Place 1 patch onto the skin daily as needed (for pain). Remove & Discard patch within 12 hours or as directed by MD.      . metolazone (ZAROXOLYN) 2.5 MG tablet Take 2.5 mg on Sundays and Thursdays  10 tablet  6  . montelukast (SINGULAIR) 10 MG tablet Take 10 mg by mouth at bedtime.       . Multiple Vitamin (MULTIVITAMIN) tablet Take 1 tablet by mouth daily.      Marland Kitchen nystatin (MYCOSTATIN) 100000 UNIT/ML suspension Take 5 mLs by mouth 4 (four) times daily.      . potassium chloride SA (K-DUR,KLOR-CON) 20 MEQ tablet Take 2 tablets (40 mEq total)  by mouth daily.  60 tablet  0  . saccharomyces boulardii (FLORASTOR) 250 MG capsule Take 1 capsule (250 mg total) by mouth 2 (two) times daily.  60 capsule  0  . sodium chloride (OCEAN) 0.65 % SOLN nasal spray Place 1 spray into both nostrils as needed for congestion.    0  . spironolactone (ALDACTONE) 25 MG tablet Take 1 tablet (25 mg total) by mouth daily.      . Tamsulosin HCl (FLOMAX) 0.4 MG CAPS Take 0.4 mg by mouth at bedtime.       . torsemide (DEMADEX) 20 MG tablet Take 80 mg by mouth daily.      . traMADol (ULTRAM) 50 MG tablet Take 1 tablet (50 mg total) by mouth every 6 (six) hours as needed for severe pain.  30 tablet    . vitamin B-12 1000 MCG tablet Take 1 tablet (1,000 mcg total) by mouth daily.  30 tablet  0   No current facility-administered medications for this visit.    Past Medical History  Diagnosis Date  . PSVT (paroxysmal supraventricular tachycardia)      AVNRT  . Coronary atherosclerosis of native coronary artery     Nonobstructive, LVEF 65%  . Hyperlipidemia   . Morbid obesity   . Arthritis   . Type 2 diabetes mellitus   . Vertigo   . Venous insufficiency   . Sleep apnea, obstructive   . Chronic diastolic heart failure     Social History Mr. Nine reports that he has never smoked. He has never used smokeless tobacco. Mr. Fuster reports that he does not drink alcohol.  Review of Systems No angina or palpitations. Has had more arthritic pains recently. Otherwise negative.  Physical Examination Filed Vitals:   09/29/13 1457  BP: 130/64  Pulse: 76   Filed Weights   09/29/13 1457  Weight: 331 lb 6.4 oz (150.322 kg)    Morbidly obese, no distress.  HEENT: Conjunctiva and lids normal, oropharynx clear.  Neck: Supple, increased girth, difficult to appreciate JVP, no carotid bruits, no thyromegaly.  Lungs: Clear to auscultation, decreased at bases, nonlabored breathing at rest.  Cardiac: Regular rate and rhythm, no S3, 2/6 systolic murmur, no pericardial rub.  Abdomen: Soft, nontender, obesey, bowel sounds present.  Extremities: Chronic appearing edema and lymphedema, compression hose on.  Skin: Warm and dry.  Musculoskeletal: No kyphosis.  Neuropsychiatric: Alert and oriented x3, affect grossly appropriate.   Problem List and Plan   Chronic diastolic heart failure Continue present regimen. I still have concerns about his compliance. Weight by his home checks is coming down. Followup BMET as per CHF clinic. I will see him back in August.  CKD (chronic kidney disease) stage 3, GFR 30-59 ml/min Creatinine 1.8 in April.  Essential hypertension, benign No change to antihypertensive regimen.    Satira Sark, M.D., F.A.C.C.

## 2013-09-29 ENCOUNTER — Encounter: Payer: Self-pay | Admitting: Cardiology

## 2013-09-29 ENCOUNTER — Ambulatory Visit (INDEPENDENT_AMBULATORY_CARE_PROVIDER_SITE_OTHER): Payer: Medicare Other | Admitting: Cardiology

## 2013-09-29 VITALS — BP 130/64 | HR 76 | Ht 72.0 in | Wt 331.4 lb

## 2013-09-29 DIAGNOSIS — N183 Chronic kidney disease, stage 3 unspecified: Secondary | ICD-10-CM

## 2013-09-29 DIAGNOSIS — I10 Essential (primary) hypertension: Secondary | ICD-10-CM | POA: Diagnosis not present

## 2013-09-29 DIAGNOSIS — I2510 Atherosclerotic heart disease of native coronary artery without angina pectoris: Secondary | ICD-10-CM | POA: Diagnosis not present

## 2013-09-29 DIAGNOSIS — I5032 Chronic diastolic (congestive) heart failure: Secondary | ICD-10-CM

## 2013-09-29 NOTE — Patient Instructions (Signed)
Your physician recommends that you schedule a follow-up appointment in: 2 months.  Your physician recommends that you continue on your current medications as directed. Please refer to the Current Medication list given to you today.  

## 2013-09-29 NOTE — Assessment & Plan Note (Signed)
Creatinine 1.8 in April.

## 2013-09-29 NOTE — Assessment & Plan Note (Signed)
Continue present regimen. I still have concerns about his compliance. Weight by his home checks is coming down. Followup BMET as per CHF clinic. I will see him back in August.

## 2013-09-29 NOTE — Assessment & Plan Note (Signed)
No change to antihypertensive regimen.

## 2013-10-06 DIAGNOSIS — F411 Generalized anxiety disorder: Secondary | ICD-10-CM | POA: Diagnosis not present

## 2013-10-06 DIAGNOSIS — G4700 Insomnia, unspecified: Secondary | ICD-10-CM | POA: Diagnosis not present

## 2013-10-13 DIAGNOSIS — E785 Hyperlipidemia, unspecified: Secondary | ICD-10-CM | POA: Diagnosis not present

## 2013-10-13 DIAGNOSIS — E119 Type 2 diabetes mellitus without complications: Secondary | ICD-10-CM | POA: Diagnosis not present

## 2013-10-18 DIAGNOSIS — E1129 Type 2 diabetes mellitus with other diabetic kidney complication: Secondary | ICD-10-CM | POA: Diagnosis not present

## 2013-10-18 DIAGNOSIS — E785 Hyperlipidemia, unspecified: Secondary | ICD-10-CM | POA: Diagnosis not present

## 2013-10-18 DIAGNOSIS — N183 Chronic kidney disease, stage 3 unspecified: Secondary | ICD-10-CM | POA: Diagnosis not present

## 2013-10-18 DIAGNOSIS — I10 Essential (primary) hypertension: Secondary | ICD-10-CM | POA: Diagnosis not present

## 2013-10-26 ENCOUNTER — Encounter (HOSPITAL_COMMUNITY): Payer: Self-pay

## 2013-10-26 ENCOUNTER — Ambulatory Visit (HOSPITAL_COMMUNITY)
Admission: RE | Admit: 2013-10-26 | Discharge: 2013-10-26 | Disposition: A | Discharge status: Home / Self Care | Payer: Medicare Other | Source: Ambulatory Visit | Attending: Internal Medicine | Admitting: Internal Medicine

## 2013-10-26 VITALS — BP 127/65 | HR 78 | Resp 20 | Wt 333.4 lb

## 2013-10-26 DIAGNOSIS — N183 Chronic kidney disease, stage 3 unspecified: Secondary | ICD-10-CM | POA: Diagnosis not present

## 2013-10-26 DIAGNOSIS — I5032 Chronic diastolic (congestive) heart failure: Secondary | ICD-10-CM | POA: Diagnosis not present

## 2013-10-26 DIAGNOSIS — I10 Essential (primary) hypertension: Secondary | ICD-10-CM

## 2013-10-26 DIAGNOSIS — I129 Hypertensive chronic kidney disease with stage 1 through stage 4 chronic kidney disease, or unspecified chronic kidney disease: Secondary | ICD-10-CM | POA: Insufficient documentation

## 2013-10-26 LAB — BASIC METABOLIC PANEL
Anion gap: 19 — ABNORMAL HIGH (ref 5–15)
BUN: 36 mg/dL — ABNORMAL HIGH (ref 6–23)
CO2: 19 mEq/L (ref 19–32)
Calcium: 9.9 mg/dL (ref 8.4–10.5)
Chloride: 102 mEq/L (ref 96–112)
Creatinine, Ser: 1.82 mg/dL — ABNORMAL HIGH (ref 0.50–1.35)
GFR calc non Af Amer: 33 mL/min — ABNORMAL LOW (ref >90)
GFR, EST AFRICAN AMERICAN: 39 mL/min — AB (ref >90)
Glucose, Bld: 174 mg/dL — ABNORMAL HIGH (ref 70–99)
POTASSIUM: 3.9 meq/L (ref 3.7–5.3)
SODIUM: 140 meq/L (ref 137–147)

## 2013-10-26 NOTE — Patient Instructions (Signed)
Try to be as active as possible and try to cut down on portion sizes.  Continue medications as prescribed.  Please bring all your medications to your next visit.  Call any issues.  Follow up 3 months  Do the following things EVERYDAY: 1) Weigh yourself in the morning before breakfast. Write it down and keep it in a log. 2) Take your medicines as prescribed 3) Eat low salt foods-Limit salt (sodium) to 2000 mg per day.  4) Stay as active as you can everyday 5) Limit all fluids for the day to less than 2 liters 6)

## 2013-10-26 NOTE — Addendum Note (Signed)
Encounter addended by: Rande Brunt, NP on: 10/26/2013  2:00 PM<BR>     Documentation filed: Notes Section

## 2013-10-26 NOTE — Progress Notes (Addendum)
Patient ID: [name], male   DOB: 04-03-1932, 80 y.o.   MRN: [id number]  Cardiologist: Dr Domenic Polite. PCP: Dr Gwyndolyn Saxon   HPI: Mr Gerling is a 66 year with a PMH of morbid obesity, chronic diastolic heart failure, HTN, SVT/PSVT/PAT, CAD, DMII, OSA-CPAP, arthritis, fibromyalgia, and hyperlipidemia.   Admitted 2/26 with HF decompensation.  He was diuresed with IV lasix and transitioned to Demadex 60 mg daily. He required short course on inpatient rehab. Discharge weight 370 pounds.   Follow up for Heart Failure: Since last visit had a fall and hit his tailbone and hand. He denies being dizzy or tripping and fell. Denies SOB, orthopnea, CP or PND. Wearing CPAP nightly and O2. Has not needed any extra fluid pills. Can walk about 200-300 ft before having to stop. Weight at home 325 at home. Has not taken medications today. Eating the "wrong things." Drinking less than 2L a day. Having trouble with reading fine print and he is concerned about.   SH: Lives in [place] with wife. No ETOH or tobacco abuse  ROS: All systems negative except as listed in HPI, PMH and Problem List.  Past Medical History  Diagnosis Date  . PSVT (paroxysmal supraventricular tachycardia)     AVNRT  . Coronary atherosclerosis of native coronary artery     Nonobstructive, LVEF 65%  . Hyperlipidemia   . Morbid obesity   . Arthritis   . Type 2 diabetes mellitus   . Vertigo   . Venous insufficiency   . Sleep apnea, obstructive   . Chronic diastolic heart failure     Current Outpatient Prescriptions  Medication Sig Dispense Refill  . acetaminophen (TYLENOL) 325 MG tablet Take 2 tablets (650 mg total) by mouth every 6 (six) hours as needed for mild pain, moderate pain or headache.      Marland Kitchen amLODipine (NORVASC) 10 MG tablet Take 1 tablet (10 mg total) by mouth daily.  30 tablet  3  . aspirin 81 MG chewable tablet Chew 2 tablets (162 mg total) by mouth daily.      . calcium-vitamin D (OSCAL WITH D) 500-200 MG-UNIT per tablet  Take 1 tablet by mouth 2 (two) times daily.      . cetirizine (ZYRTEC) 10 MG tablet Take 10 mg by mouth every evening.       . Cholecalciferol (D-3-5) 5000 UNITS capsule Take 5,000 Units by mouth daily.       . fluticasone (FLONASE) 50 MCG/ACT nasal spray Place 2 sprays into the nose daily.       Marland Kitchen gabapentin (NEURONTIN) 300 MG capsule Take 1 capsule (300 mg total) by mouth 2 (two) times daily.  60 capsule  4  . HYDROcodone-acetaminophen (NORCO/VICODIN) 5-325 MG per tablet Take 1-2 tablets by mouth every 6 (six) hours as needed for moderate pain.  30 tablet  0  . insulin detemir (LEVEMIR) 100 UNIT/ML injection Inject 0.15 mLs (15 Units total) into the skin at bedtime.  10 mL  12  . labetalol (NORMODYNE) 200 MG tablet Take 1 tablet (200 mg total) by mouth 2 (two) times daily.  60 tablet  1  . latanoprost (XALATAN) 0.005 % ophthalmic solution Place 1 drop into the right eye at bedtime.       . lidocaine (LIDODERM) 5 % Place 1 patch onto the skin daily as needed (for pain). Remove & Discard patch within 12 hours or as directed by MD.      . metolazone (ZAROXOLYN) 2.5 MG tablet Take  2.5 mg on Sundays and Thursdays  10 tablet  6  . montelukast (SINGULAIR) 10 MG tablet Take 10 mg by mouth at bedtime.       . Multiple Vitamin (MULTIVITAMIN) tablet Take 1 tablet by mouth daily.      Marland Kitchen nystatin (MYCOSTATIN) 100000 UNIT/ML suspension Take 5 mLs by mouth 4 (four) times daily.      . potassium chloride SA (K-DUR,KLOR-CON) 20 MEQ tablet Take 2 tablets (40 mEq total) by mouth daily.  60 tablet  0  . saccharomyces boulardii (FLORASTOR) 250 MG capsule Take 1 capsule (250 mg total) by mouth 2 (two) times daily.  60 capsule  0  . sodium chloride (OCEAN) 0.65 % SOLN nasal spray Place 1 spray into both nostrils as needed for congestion.    0  . spironolactone (ALDACTONE) 25 MG tablet Take 1 tablet (25 mg total) by mouth daily.      . Tamsulosin HCl (FLOMAX) 0.4 MG CAPS Take 0.4 mg by mouth at bedtime.       .  torsemide (DEMADEX) 20 MG tablet Take 80 mg by mouth daily.      . traMADol (ULTRAM) 50 MG tablet Take 1 tablet (50 mg total) by mouth every 6 (six) hours as needed for severe pain.  30 tablet    . vitamin B-12 1000 MCG tablet Take 1 tablet (1,000 mcg total) by mouth daily.  30 tablet  0   No current facility-administered medications for this encounter.    Filed Vitals:   10/26/13 1034  BP: 127/65  Pulse: 78  Resp: 20  Weight: 333 lb 6 oz (151.218 kg)  SpO2: 97%   Physical Exam:  General: Pleasant; Morbidly obese. Sitting in wheelchair.  No resp difficulty Wife present in wheelchair HEENT: normal except for blind in L eye  Neck: supple. JVP hard to see d/t body habitus but appears 8; Carotids 2+ bilat; no bruits. No lymphadenopathy or thryomegaly appreciated.  Cor: PMI nonpalpable. Regular rate & rhythm. No rubs, gallops or murmurs.  Lungs: clear  Abdomen: morbidly obese, soft, nontender, mildly distended. . No bruits or masses. Good bowel sounds.  Extremities: no cyanosis, clubbing, rash, bilateral  LE edema L>R L2+ and R 1+, severe chronic venous stasis changes. Bilateral TED hose  Neuro: alert & orientedx3, cranial nerves grossly intact. moves all 4 extremities w/o difficulty. Affect pleasant   EKG: SR with frequent PVCs and PACs  ASSESSMENT & PLAN:  1) Chronic diastolic HF: EF 81% (04/7508) - NYHA II symptoms and volume status appears mildly elevated. He has not taken his medications for today. Will continue torsemide 80 mg daily with metolazone 2.5 mg on Sundays and Wednesdays.  - BP stable.  - Reinforced the need and importance of daily weights, a low sodium diet, and fluid restriction (less than 2 L a day). Instructed to call the HF clinic if weight increases more than 3 lbs overnight or 5 lbs in a week. We continue to discuss each visit about his diet and the restrictions he needs to follow.  2) OSA - Continues to wear biPAP at night and O2 at night continue. 3) HTN -  Stable. Will continue to follow he did not take medications today and stressed again the need to take meds before coming to his visit.  4) CRI, stage III - Baseline Cr 1.5-2.0 - Will check BMET today  F/U in October. He has follow up with Domenic Polite in August.  Rande Brunt, NP-C 10:58 AM

## 2013-10-27 DIAGNOSIS — G479 Sleep disorder, unspecified: Secondary | ICD-10-CM | POA: Diagnosis not present

## 2013-10-27 DIAGNOSIS — R413 Other amnesia: Secondary | ICD-10-CM | POA: Diagnosis not present

## 2013-10-27 NOTE — Addendum Note (Signed)
Encounter addended by: Evalee Mutton, CCT on: 10/27/2013  9:15 AM<BR>     Documentation filed: Charges VN

## 2013-11-14 DIAGNOSIS — E119 Type 2 diabetes mellitus without complications: Secondary | ICD-10-CM | POA: Diagnosis not present

## 2013-11-14 DIAGNOSIS — E1149 Type 2 diabetes mellitus with other diabetic neurological complication: Secondary | ICD-10-CM | POA: Diagnosis not present

## 2013-11-23 DIAGNOSIS — E119 Type 2 diabetes mellitus without complications: Secondary | ICD-10-CM | POA: Diagnosis not present

## 2013-11-23 DIAGNOSIS — G4700 Insomnia, unspecified: Secondary | ICD-10-CM | POA: Diagnosis not present

## 2013-11-23 DIAGNOSIS — M0560 Rheumatoid arthritis of unspecified site with involvement of other organs and systems: Secondary | ICD-10-CM | POA: Diagnosis not present

## 2013-11-28 ENCOUNTER — Other Ambulatory Visit (HOSPITAL_COMMUNITY): Payer: Self-pay | Admitting: Internal Medicine

## 2013-11-30 DIAGNOSIS — L820 Inflamed seborrheic keratosis: Secondary | ICD-10-CM | POA: Diagnosis not present

## 2013-11-30 DIAGNOSIS — D485 Neoplasm of uncertain behavior of skin: Secondary | ICD-10-CM | POA: Diagnosis not present

## 2013-11-30 DIAGNOSIS — L821 Other seborrheic keratosis: Secondary | ICD-10-CM | POA: Diagnosis not present

## 2013-12-06 ENCOUNTER — Ambulatory Visit (INDEPENDENT_AMBULATORY_CARE_PROVIDER_SITE_OTHER): Payer: Medicare Other | Admitting: Cardiology

## 2013-12-06 ENCOUNTER — Encounter: Payer: Self-pay | Admitting: Cardiology

## 2013-12-06 VITALS — BP 138/78 | HR 73 | Ht 72.0 in | Wt 340.0 lb

## 2013-12-06 DIAGNOSIS — I2510 Atherosclerotic heart disease of native coronary artery without angina pectoris: Secondary | ICD-10-CM | POA: Diagnosis not present

## 2013-12-06 DIAGNOSIS — I5032 Chronic diastolic (congestive) heart failure: Secondary | ICD-10-CM | POA: Diagnosis not present

## 2013-12-06 DIAGNOSIS — I10 Essential (primary) hypertension: Secondary | ICD-10-CM

## 2013-12-06 DIAGNOSIS — I471 Supraventricular tachycardia: Secondary | ICD-10-CM | POA: Diagnosis not present

## 2013-12-06 NOTE — Progress Notes (Signed)
Clinical Summary [name] is an 80 y.o.male last seen in June. He had interval followup in the CHF clinic in July. He was continued on torsemide 80 mg daily with metolazone 2.5 mg on Sundays and Wednesdays. Diet and fluid restriction reinforced. He tells me that his weight has been stable by home scales, "129" this morning.  Lab work from July showed potassium 3.9, BUN 36, creatinine 1.8 which was stable.  Weight today is up to 340 pounds from 333 pounds on our scales. He does not report any feeling of progressive edema.  We discussed sodium and fluid restriction, he is not entirely compliant with this. Also reinforced his medications, use of additional Zaroxolyn with weight gain.  He is not having any angina symptoms, ECG from July reviewed.   Allergies  Allergen Reactions  . Ambien [Zolpidem Tartrate]     Makes him cuss like a sailor  . Lyrica [Pregabalin]     Causes him to have fluid  . Penicillins Hives    nightmares  . Piroxicam     unknown  . Sulfonamide Derivatives     Night sweats    Current Outpatient Prescriptions  Medication Sig Dispense Refill  . acetaminophen (TYLENOL) 325 MG tablet Take 2 tablets (650 mg total) by mouth every 6 (six) hours as needed for mild pain, moderate pain or headache.      Marland Kitchen amLODipine (NORVASC) 10 MG tablet TAKE ONE TABLET DAILY.  30 tablet  2  . aspirin 81 MG chewable tablet Chew 2 tablets (162 mg total) by mouth daily.      . calcium-vitamin D (OSCAL WITH D) 500-200 MG-UNIT per tablet Take 1 tablet by mouth 2 (two) times daily.      . cetirizine (ZYRTEC) 10 MG tablet Take 10 mg by mouth every evening.       . Cholecalciferol (D-3-5) 5000 UNITS capsule Take 5,000 Units by mouth daily.       . fluticasone (FLONASE) 50 MCG/ACT nasal spray Place 2 sprays into the nose daily.       Marland Kitchen gabapentin (NEURONTIN) 300 MG capsule Take 1 capsule (300 mg total) by mouth 2 (two) times daily.  60 capsule  4  . HYDROcodone-acetaminophen (NORCO/VICODIN)  5-325 MG per tablet Take 1-2 tablets by mouth every 6 (six) hours as needed for moderate pain.  30 tablet  0  . insulin detemir (LEVEMIR) 100 UNIT/ML injection Inject 0.15 mLs (15 Units total) into the skin at bedtime.  10 mL  12  . labetalol (NORMODYNE) 200 MG tablet Take 1 tablet (200 mg total) by mouth 2 (two) times daily.  60 tablet  1  . latanoprost (XALATAN) 0.005 % ophthalmic solution Place 1 drop into the right eye at bedtime.       . lidocaine (LIDODERM) 5 % Place 1 patch onto the skin daily as needed (for pain). Remove & Discard patch within 12 hours or as directed by MD.      . metolazone (ZAROXOLYN) 2.5 MG tablet Take 2.5 mg on Sundays and Thursdays  10 tablet  6  . montelukast (SINGULAIR) 10 MG tablet Take 10 mg by mouth at bedtime.       . Multiple Vitamin (MULTIVITAMIN) tablet Take 1 tablet by mouth daily.      Marland Kitchen nystatin (MYCOSTATIN) 100000 UNIT/ML suspension Take 5 mLs by mouth 4 (four) times daily.      . potassium chloride SA (K-DUR,KLOR-CON) 20 MEQ tablet Take 2 tablets (40 mEq total) by  mouth daily.  60 tablet  0  . saccharomyces boulardii (FLORASTOR) 250 MG capsule Take 1 capsule (250 mg total) by mouth 2 (two) times daily.  60 capsule  0  . sodium chloride (OCEAN) 0.65 % SOLN nasal spray Place 1 spray into both nostrils as needed for congestion.    0  . spironolactone (ALDACTONE) 25 MG tablet Take 1 tablet (25 mg total) by mouth daily.      . Tamsulosin HCl (FLOMAX) 0.4 MG CAPS Take 0.4 mg by mouth at bedtime.       . torsemide (DEMADEX) 20 MG tablet Take 80 mg by mouth daily.      . traMADol (ULTRAM) 50 MG tablet Take 1 tablet (50 mg total) by mouth every 6 (six) hours as needed for severe pain.  30 tablet    . vitamin B-12 1000 MCG tablet Take 1 tablet (1,000 mcg total) by mouth daily.  30 tablet  0   No current facility-administered medications for this visit.    Past Medical History  Diagnosis Date  . PSVT (paroxysmal supraventricular tachycardia)     AVNRT  .  Coronary atherosclerosis of native coronary artery     Nonobstructive, LVEF 65%  . Hyperlipidemia   . Morbid obesity   . Arthritis   . Type 2 diabetes mellitus   . Vertigo   . Venous insufficiency   . Sleep apnea, obstructive   . Chronic diastolic heart failure     Social History [name] reports that he has never smoked. He has never used smokeless tobacco. [name] reports that he does not drink alcohol.  Review of Systems Intermittent muscle cramps. Other systems reviewed and negative.  Physical Examination Filed Vitals:   12/06/13 1354  BP: 138/78  Pulse: 73   Filed Weights   12/06/13 1354  Weight: 340 lb (154.223 kg)    Morbidly obese, no distress.  HEENT: Conjunctiva and lids normal, oropharynx clear.  Neck: Supple, increased girth, difficult to appreciate JVP, no carotid bruits, no thyromegaly.  Lungs: Clear to auscultation, decreased at bases, nonlabored breathing at rest.  Cardiac: Regular rate and rhythm, no S3, 2/6 systolic murmur, no pericardial rub.  Abdomen: Soft, nontender, obesey, bowel sounds present.  Extremities: Chronic appearing edema and lymphedema, compression hose on.  Skin: Warm and dry.  Musculoskeletal: No kyphosis.  Neuropsychiatric: Alert and oriented x3, affect grossly appropriate.   Problem List and Plan   CAD, NATIVE VESSEL No active angina with history of nonobstructive disease. Recent ECG reviewed.  Chronic diastolic heart failure No change to current diuretic regimen. Reinforced sodium and fluid restriction again today. Keep follow up in CHF clinic scheduled for the next 6 weeks. We will see him after that.  Essential hypertension, benign Continue current medications.  SVT/ PSVT/ PAT Quiescent, no palpitations.    Satira Sark, M.D., F.A.C.C.

## 2013-12-06 NOTE — Assessment & Plan Note (Signed)
No change to current diuretic regimen. Reinforced sodium and fluid restriction again today. Keep follow up in CHF clinic scheduled for the next 6 weeks. We will see him after that.

## 2013-12-06 NOTE — Assessment & Plan Note (Signed)
No active angina with history of nonobstructive disease. Recent ECG reviewed.

## 2013-12-06 NOTE — Assessment & Plan Note (Signed)
Quiescent, no palpitations.

## 2013-12-06 NOTE — Patient Instructions (Signed)
Your physician recommends that you schedule a follow-up appointment in: 3 MONTHS with Dr Domenic Polite  Your physician recommends that you continue on your current medications as directed. Please refer to the Current Medication list given to you today.

## 2013-12-06 NOTE — Assessment & Plan Note (Signed)
Continue current medications. 

## 2013-12-20 ENCOUNTER — Telehealth: Payer: Self-pay | Admitting: Cardiology

## 2013-12-20 MED ORDER — TORSEMIDE 20 MG PO TABS: 80.0000 mg | ORAL_TABLET | Freq: Every day | ORAL | Status: DC

## 2013-12-20 NOTE — Telephone Encounter (Signed)
Received fax refill request  Rx # [id number] Medication:  Torsemide 20 mg tablet Qty 120 Sig:  Take 4 tablets by mouth daily Physician:  Domenic Polite

## 2014-01-04 DIAGNOSIS — H6520 Chronic serous otitis media, unspecified ear: Secondary | ICD-10-CM | POA: Diagnosis not present

## 2014-01-04 DIAGNOSIS — H903 Sensorineural hearing loss, bilateral: Secondary | ICD-10-CM | POA: Diagnosis not present

## 2014-01-17 DIAGNOSIS — E119 Type 2 diabetes mellitus without complications: Secondary | ICD-10-CM | POA: Diagnosis not present

## 2014-01-17 DIAGNOSIS — E785 Hyperlipidemia, unspecified: Secondary | ICD-10-CM | POA: Diagnosis not present

## 2014-01-26 ENCOUNTER — Inpatient Hospital Stay (HOSPITAL_COMMUNITY): Admission: RE | Admit: 2014-01-26 | Payer: Medicare Other | Source: Ambulatory Visit

## 2014-01-27 DIAGNOSIS — K2970 Gastritis, unspecified, without bleeding: Secondary | ICD-10-CM | POA: Diagnosis not present

## 2014-01-27 DIAGNOSIS — K2971 Gastritis, unspecified, with bleeding: Secondary | ICD-10-CM | POA: Diagnosis not present

## 2014-01-27 DIAGNOSIS — D508 Other iron deficiency anemias: Secondary | ICD-10-CM | POA: Diagnosis not present

## 2014-01-27 DIAGNOSIS — E1122 Type 2 diabetes mellitus with diabetic chronic kidney disease: Secondary | ICD-10-CM | POA: Diagnosis not present

## 2014-02-15 DIAGNOSIS — L299 Pruritus, unspecified: Secondary | ICD-10-CM | POA: Diagnosis not present

## 2014-02-15 DIAGNOSIS — D239 Other benign neoplasm of skin, unspecified: Secondary | ICD-10-CM | POA: Diagnosis not present

## 2014-02-15 DIAGNOSIS — L570 Actinic keratosis: Secondary | ICD-10-CM | POA: Diagnosis not present

## 2014-03-02 DIAGNOSIS — E1151 Type 2 diabetes mellitus with diabetic peripheral angiopathy without gangrene: Secondary | ICD-10-CM | POA: Diagnosis not present

## 2014-03-02 DIAGNOSIS — B351 Tinea unguium: Secondary | ICD-10-CM | POA: Diagnosis not present

## 2014-03-03 IMAGING — CT CT HEAD W/O CM
2 series · 16 of 30 positions shown, 19 images · non-contrast
Comparison: 06/24/2006

CLINICAL DATA: Dizziness

EXAM:
CT HEAD WITHOUT CONTRAST
TECHNIQUE: Contiguous axial images were obtained from the base of the skull
through the vertex without intravenous contrast.

[Series 2: head w/o · axial · non-contrast · 0.48mm/px · z∈[+1476,+1596]mm · 9 of 31 slices shown, 12 images]
[im 4/31  brain]
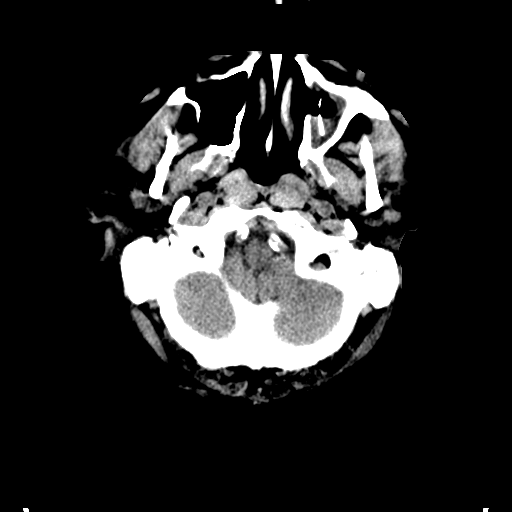
[im 4/31  bone]
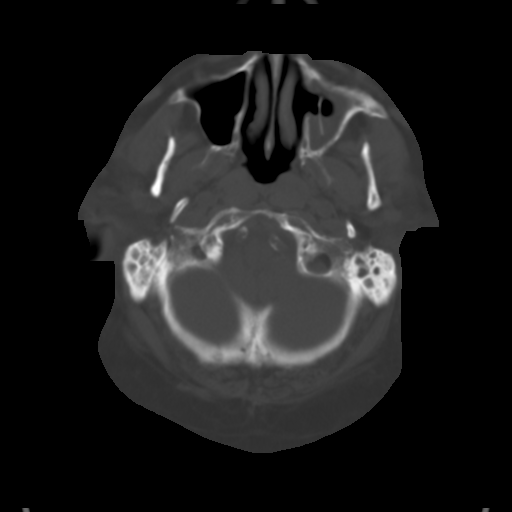
[im 7/31  brain]
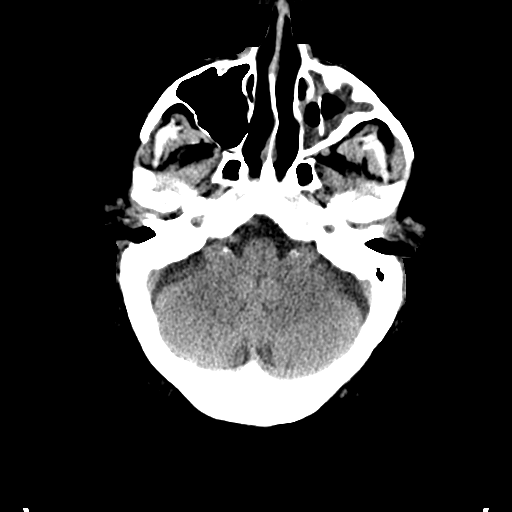
[im 10/31  brain]
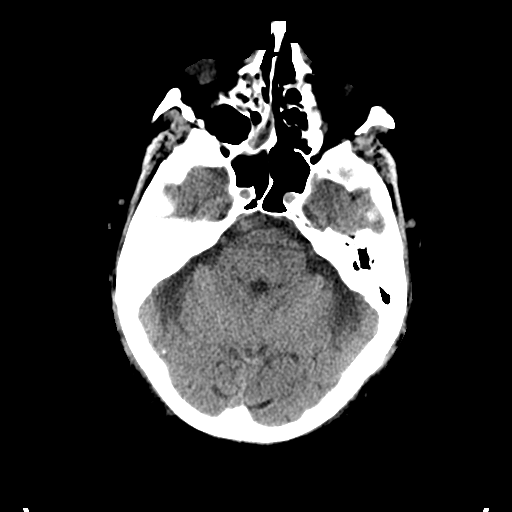
[im 13/31  brain]
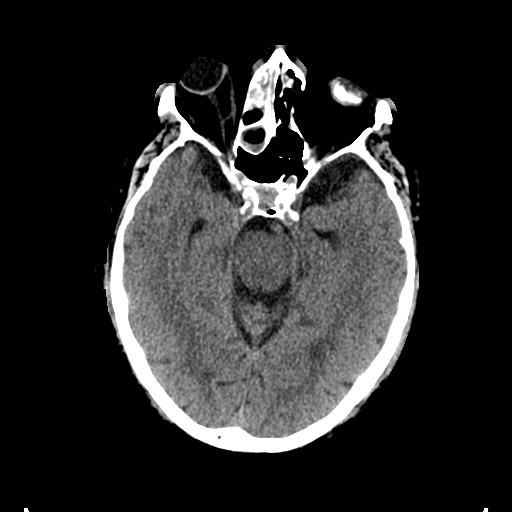
[im 16/31  brain]
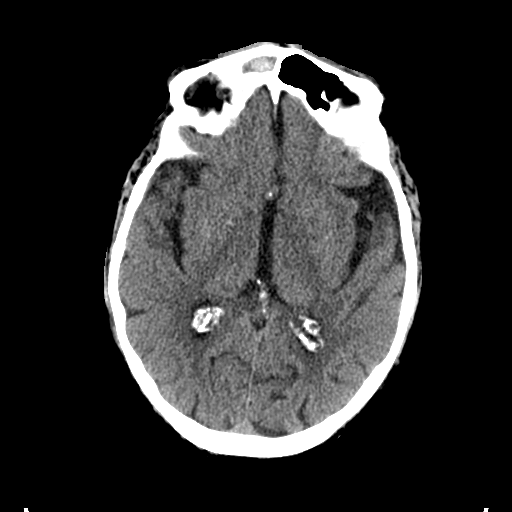
[im 16/31  bone]
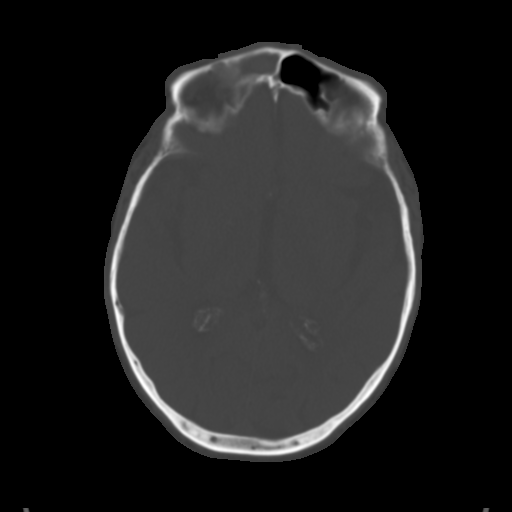
[im 19/31  brain]
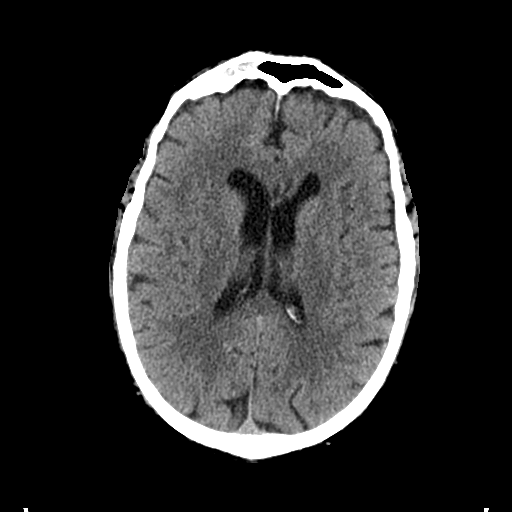
[im 22/31  brain]
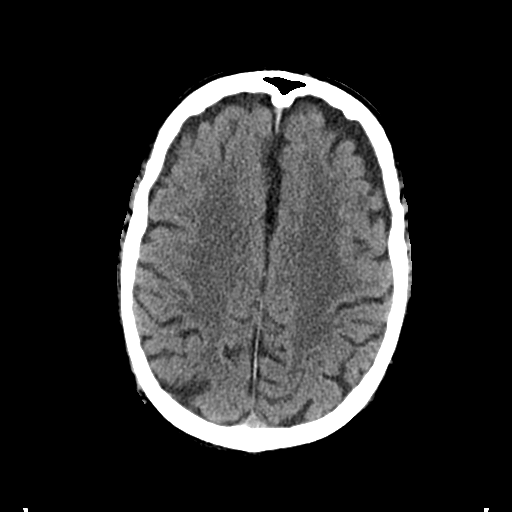
[im 25/31  brain]
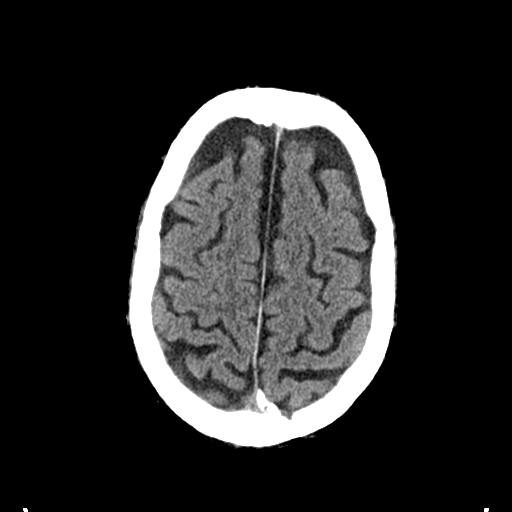
[im 28/31  brain]
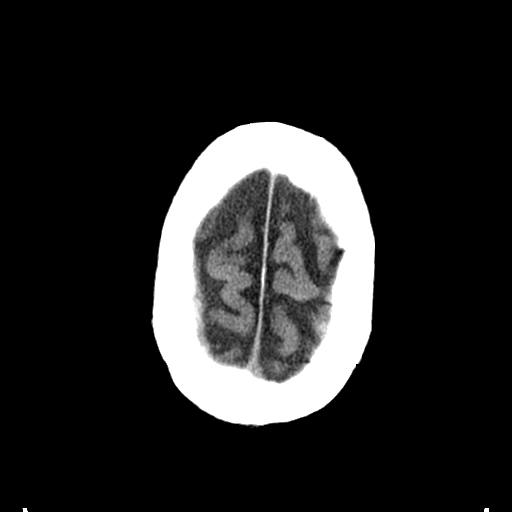
[im 28/31  bone]
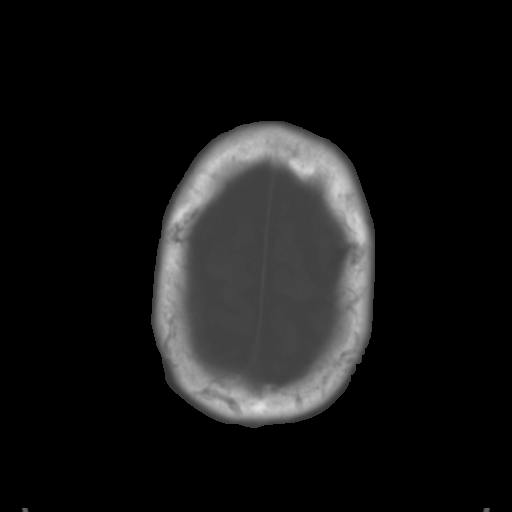

[Series 3: bone windows · axial · 0.48mm/px · z∈[+1476,+1575]mm · 7 of 51 slices shown]
[im 6/51  bone]
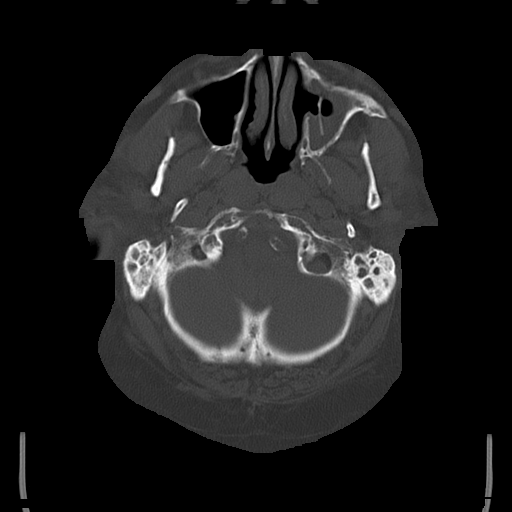
[im 12/51  bone]
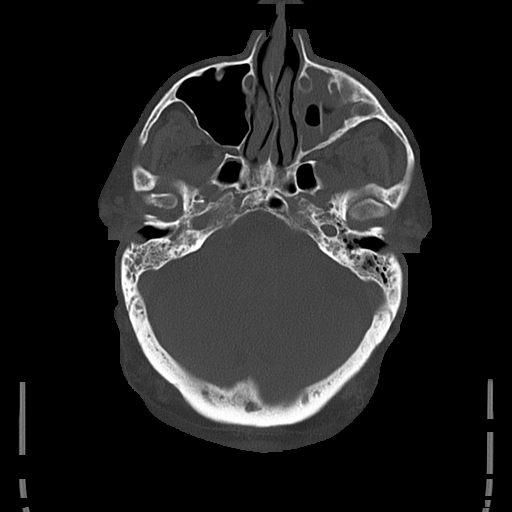
[im 17/51  bone]
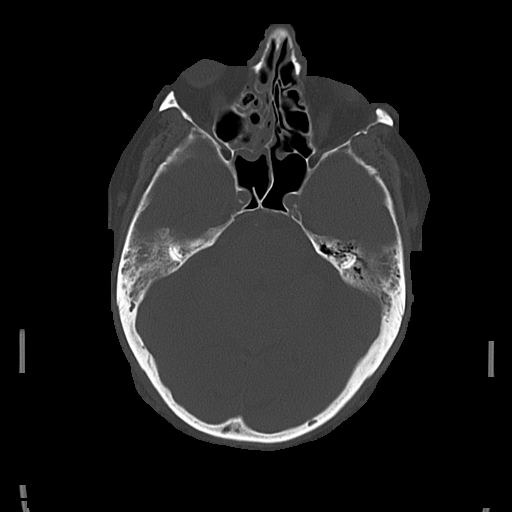
[im 23/51  bone]
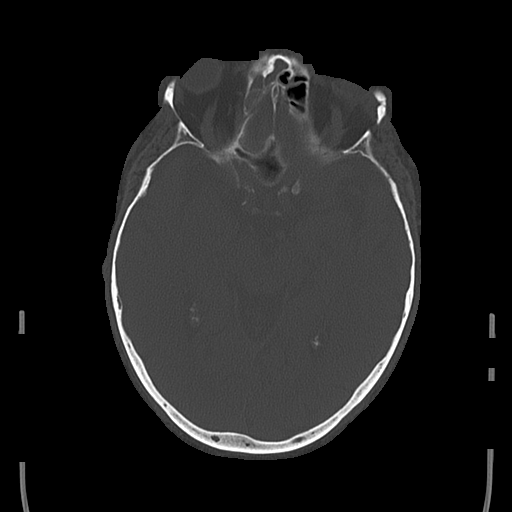
[im 28/51  bone]
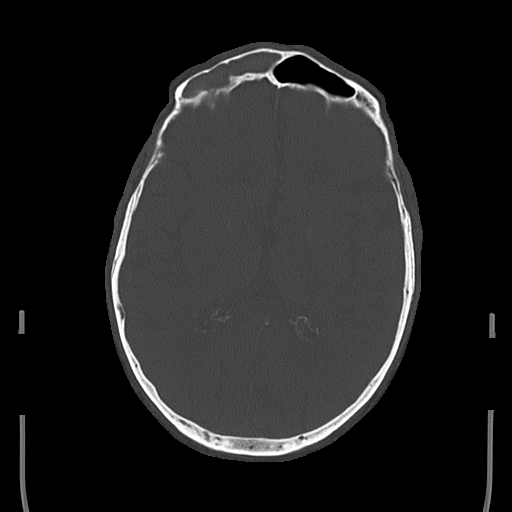
[im 34/51  bone]
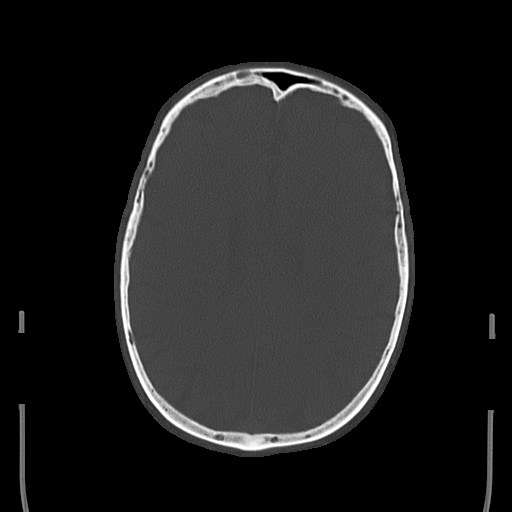
[im 39/51  bone]
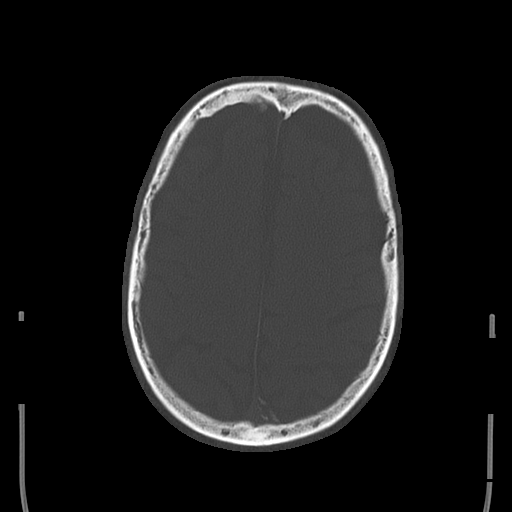

[16 of 30 positions shown; findings below may reference images not displayed]

FINDINGS: Skull and Sinuses:Bilateral mastoid and middle ear opacification,
present on the right in 8005. Patchy mucosal thickening in the
bilateral paranasal sinuses, most severe in the left maxillary
antrum, right anterior ethmoids, and right frontal sinus. Some of
the contents is high-density, compatible with inspissated secretions
or fungal colonization. Linear foreign body in the left maxillary
antrum is likely a displaced plate for treatment of remote blowout
fracture of the left orbital floor - also seen in 8005. There is
also a remote blowout fracture of the medial wall left orbit.

Orbits: Pthisis bulbi on the left. Right proptosis, without
retro-orbital mass or inflammation.

Brain: No evidence of acute abnormality, such as acute infarction,
hemorrhage, hydrocephalus, or mass lesion/mass effect. There is
atrophy and mild chronic small vessel ischemic white matter changes.
IMPRESSION: 1. No evidence of acute intracranial disease.
2. Bilateral mastoid and middle ear opacification. Consider
mastoiditis as a cause of the patient's dizziness.
3. Multifocal inflammatory paranasal sinus disease.
4. Remote trauma to the left orbit, discussed above.

## 2014-03-13 ENCOUNTER — Other Ambulatory Visit: Payer: Self-pay | Admitting: *Deleted

## 2014-03-13 MED ORDER — AMLODIPINE BESYLATE 10 MG PO TABS: 10.0000 mg | ORAL_TABLET | Freq: Every day | ORAL | Status: DC

## 2014-03-21 ENCOUNTER — Encounter: Payer: Self-pay | Admitting: Cardiology

## 2014-03-21 ENCOUNTER — Encounter: Payer: Medicare Other | Admitting: Cardiology

## 2014-03-21 NOTE — Progress Notes (Signed)
Patient canceled.  This encounter was created in error - please disregard. 

## 2014-04-06 ENCOUNTER — Ambulatory Visit: Payer: Medicare Other | Admitting: Cardiology

## 2014-04-17 ENCOUNTER — Ambulatory Visit (INDEPENDENT_AMBULATORY_CARE_PROVIDER_SITE_OTHER): Payer: Medicare Other | Admitting: Cardiology

## 2014-04-17 ENCOUNTER — Encounter: Payer: Self-pay | Admitting: Cardiology

## 2014-04-17 VITALS — BP 146/65 | HR 63 | Ht 72.0 in | Wt 361.0 lb

## 2014-04-17 DIAGNOSIS — I10 Essential (primary) hypertension: Secondary | ICD-10-CM | POA: Diagnosis not present

## 2014-04-17 DIAGNOSIS — I2510 Atherosclerotic heart disease of native coronary artery without angina pectoris: Secondary | ICD-10-CM

## 2014-04-17 DIAGNOSIS — I5033 Acute on chronic diastolic (congestive) heart failure: Secondary | ICD-10-CM

## 2014-04-17 DIAGNOSIS — N183 Chronic kidney disease, stage 3 unspecified: Secondary | ICD-10-CM

## 2014-04-17 DIAGNOSIS — I471 Supraventricular tachycardia: Secondary | ICD-10-CM | POA: Diagnosis not present

## 2014-04-17 MED ORDER — TORSEMIDE 20 MG PO TABS: 40.0000 mg | ORAL_TABLET | Freq: Two times a day (BID) | ORAL | Status: DC

## 2014-04-17 NOTE — Assessment & Plan Note (Signed)
Creatinine 1.8 in July.

## 2014-04-17 NOTE — Assessment & Plan Note (Signed)
Patient has worsened over the last 6 months. He has not had regular follow-up, and admits to some noncompliance with his medications. Plan is to change Demadex to 40 mg twice daily, resume regular use of Zaroxolyn. We also discussed sodium, fluid, and caloric restriction. Follow-up arranged within the next 2 weeks with BMET. He will need hospitalization if situation does not start to turn around shortly.

## 2014-04-17 NOTE — Assessment & Plan Note (Signed)
Continue baseline regimen with diuretic changes made above.

## 2014-04-17 NOTE — Patient Instructions (Signed)
Your physician recommends that you schedule a follow-up appointment in: 2 weeks. Your physician has recommended you make the following change in your medication:  Please take torsemide 40 mg twice daily. Please restart your metalozone. Continue all other medications the same. Your physician recommends that you have lab work in 2 weeks just before your next visit to check your BMET.

## 2014-04-17 NOTE — Assessment & Plan Note (Signed)
Quiescent, continue labetalol.

## 2014-04-17 NOTE — Progress Notes (Signed)
Reason for visit: Diastolic heart failure  Clinical Summary [name] is an 80 y.o.male last seen in August, he has missed follow-up visits, including in the CHF clinic. He is here with his wife for a follow-up visit. He admits that he has not been completely compliant with his medications, not using Zaroxolyn with any regularity, does report taking Demadex regularly. He states that his weight has gradually increased over a period of months, he has significant leg edema and limited mobility.  His weight is up substantially, over 20 pounds compared to August.   Lab work from July showed potassium 3.9, BUN 36, creatinine 1.8.   Today we discussed the importance of his medications, anticipated adjustments, also reinforced fluid, caloric, and sodium restriction. He is heading on a course for hospitalization if things do not turn in the right direction soon.  Allergies  Allergen Reactions  . Piroxicam Hives    unknown  . Ambien [Zolpidem Tartrate]     Makes him cuss like a sailor  . Lyrica [Pregabalin]     Causes him to have fluid  . Penicillins Hives    nightmares  . Sulfonamide Derivatives     Night sweats    Current Outpatient Prescriptions  Medication Sig Dispense Refill  . acetaminophen (TYLENOL) 325 MG tablet Take 2 tablets (650 mg total) by mouth every 6 (six) hours as needed for mild pain, moderate pain or headache.    Marland Kitchen amLODipine (NORVASC) 10 MG tablet Take 1 tablet (10 mg total) by mouth daily. 30 tablet 6  . aspirin 81 MG chewable tablet Chew 2 tablets (162 mg total) by mouth daily.    . calcium-vitamin D (OSCAL WITH D) 500-200 MG-UNIT per tablet Take 1 tablet by mouth 2 (two) times daily.    . cetirizine (ZYRTEC) 10 MG tablet Take 10 mg by mouth every evening.     . Cholecalciferol (D-3-5) 5000 UNITS capsule Take 5,000 Units by mouth daily.     . fluticasone (FLONASE) 50 MCG/ACT nasal spray Place 2 sprays into the nose daily.     Marland Kitchen gabapentin (NEURONTIN) 300 MG capsule  Take 1 capsule (300 mg total) by mouth 2 (two) times daily. 60 capsule 4  . HYDROcodone-acetaminophen (NORCO/VICODIN) 5-325 MG per tablet Take 1-2 tablets by mouth every 6 (six) hours as needed for moderate pain. 30 tablet 0  . insulin detemir (LEVEMIR) 100 UNIT/ML injection Inject 0.15 mLs (15 Units total) into the skin at bedtime. 10 mL 12  . labetalol (NORMODYNE) 200 MG tablet Take 1 tablet (200 mg total) by mouth 2 (two) times daily. 60 tablet 1  . latanoprost (XALATAN) 0.005 % ophthalmic solution Place 1 drop into the right eye at bedtime.     . lidocaine (LIDODERM) 5 % Place 1 patch onto the skin daily as needed (for pain). Remove & Discard patch within 12 hours or as directed by MD.    . metolazone (ZAROXOLYN) 2.5 MG tablet Take 2.5 mg on Sundays and Thursdays 10 tablet 6  . montelukast (SINGULAIR) 10 MG tablet Take 10 mg by mouth at bedtime.     . Multiple Vitamin (MULTIVITAMIN) tablet Take 1 tablet by mouth daily.    Marland Kitchen nystatin (MYCOSTATIN) 100000 UNIT/ML suspension Take 5 mLs by mouth 4 (four) times daily.    . potassium chloride SA (K-DUR,KLOR-CON) 20 MEQ tablet Take 2 tablets (40 mEq total) by mouth daily. 60 tablet 0  . saccharomyces boulardii (FLORASTOR) 250 MG capsule Take 1 capsule (250 mg total)  by mouth 2 (two) times daily. 60 capsule 0  . sodium chloride (OCEAN) 0.65 % SOLN nasal spray Place 1 spray into both nostrils as needed for congestion.  0  . spironolactone (ALDACTONE) 25 MG tablet Take 1 tablet (25 mg total) by mouth daily.    . Tamsulosin HCl (FLOMAX) 0.4 MG CAPS Take 0.4 mg by mouth at bedtime.     . torsemide (DEMADEX) 20 MG tablet Take 2 tablets (40 mg total) by mouth 2 (two) times daily. 120 tablet 3  . traMADol (ULTRAM) 50 MG tablet Take 1 tablet (50 mg total) by mouth every 6 (six) hours as needed for severe pain. 30 tablet   . vitamin B-12 1000 MCG tablet Take 1 tablet (1,000 mcg total) by mouth daily. 30 tablet 0   No current facility-administered medications  for this visit.    Past Medical History  Diagnosis Date  . PSVT (paroxysmal supraventricular tachycardia)     AVNRT  . Coronary atherosclerosis of native coronary artery     Nonobstructive, LVEF 65%  . Hyperlipidemia   . Morbid obesity   . Arthritis   . Type 2 diabetes mellitus   . Vertigo   . Venous insufficiency   . Sleep apnea, obstructive   . Chronic diastolic heart failure     Social History [name] reports that he has never smoked. He has never used smokeless tobacco. [name] reports that he does not drink alcohol.  Review of Systems Complete review of systems negative except as otherwise outlined in the clinical summary and also the following. No palpitations or chest pain.  Physical Examination Filed Vitals:   04/17/14 1351  BP: 146/65  Pulse: 63   Filed Weights   04/17/14 1351  Weight: 361 lb (163.749 kg)   Morbidly obese, no distress.  HEENT: Conjunctiva and lids normal, oropharynx clear.  Neck: Supple, increased girth, difficult to appreciate JVP, no carotid bruits, no thyromegaly.  Lungs: Clear to auscultation, decreased at bases, nonlabored breathing at rest.  Cardiac: Regular rate and rhythm, no S3, 2/6 systolic murmur, no pericardial rub.  Abdomen: Soft, nontender, obesey, bowel sounds present.  Extremities: Chronic appearing firm edema and lymphedema.  Skin: Warm and dry.  Musculoskeletal: No kyphosis.  Neuropsychiatric: Alert and oriented x3, affect grossly appropriate.   Problem List and Plan   Acute on chronic diastolic heart failure Patient has worsened over the last 6 months. He has not had regular follow-up, and admits to some noncompliance with his medications. Plan is to change Demadex to 40 mg twice daily, resume regular use of Zaroxolyn. We also discussed sodium, fluid, and caloric restriction. Follow-up arranged within the next 2 weeks with BMET. He will need hospitalization if situation does not start to turn around  shortly.  Essential hypertension, benign Continue baseline regimen with diuretic changes made above.  SVT/ PSVT/ PAT Quiescent, continue labetalol.  CKD (chronic kidney disease) stage 3, GFR 30-59 ml/min Creatinine 1.8 in July.    Satira Sark, M.D., F.A.C.C.

## 2014-04-24 ENCOUNTER — Telehealth: Payer: Self-pay | Admitting: Cardiology

## 2014-04-24 NOTE — Telephone Encounter (Signed)
[name] called today wanting to know how much weight he needs to lose before his next appointment.

## 2014-04-26 NOTE — Telephone Encounter (Signed)
No further recommendations with respect to weight loss. Can f/u with Dr. Domenic Polite as scheduled.

## 2014-04-26 NOTE — Telephone Encounter (Signed)
[name] called back today wanting to know if we had heard from Dr. Domenic Polite

## 2014-04-26 NOTE — Telephone Encounter (Signed)
Returned call to patient.  Stated that he has lost 20 lbs to date & is continuing all medications as suggested at last OV.  Already has follow up scheduled for 05/04/14 with Dr. Domenic Polite & will be doing BMET just prior to that visit.  Will forward to Dr. Bronson Ing as Dr. Domenic Polite is out of the office.  Will make sure no other recommendations need to be made prior to that visit since coming up on holiday.  Dr. Domenic Polite will return on Monday, 05/01/2014.

## 2014-04-27 NOTE — Telephone Encounter (Signed)
Patient notified

## 2014-05-03 DIAGNOSIS — I5033 Acute on chronic diastolic (congestive) heart failure: Secondary | ICD-10-CM | POA: Diagnosis not present

## 2014-05-04 ENCOUNTER — Ambulatory Visit (INDEPENDENT_AMBULATORY_CARE_PROVIDER_SITE_OTHER): Payer: Medicare Other | Admitting: Cardiology

## 2014-05-04 ENCOUNTER — Encounter: Payer: Self-pay | Admitting: Cardiology

## 2014-05-04 VITALS — BP 122/69 | HR 64 | Ht 72.0 in | Wt 337.8 lb

## 2014-05-04 DIAGNOSIS — I10 Essential (primary) hypertension: Secondary | ICD-10-CM | POA: Diagnosis not present

## 2014-05-04 DIAGNOSIS — I5033 Acute on chronic diastolic (congestive) heart failure: Secondary | ICD-10-CM | POA: Diagnosis not present

## 2014-05-04 NOTE — Assessment & Plan Note (Signed)
Blood pressure control is good today. 

## 2014-05-04 NOTE — Patient Instructions (Signed)
Your physician recommends that you schedule a follow-up appointment in: 2 weeks. Your physician has recommended you make the following change in your medication:  STOP METOLAZONE OR ZAROXOLYN. Continue all other medications the same. Your physician recommends that you have lab work in 2 weeks just before your next visit to check your BMET.

## 2014-05-04 NOTE — Assessment & Plan Note (Signed)
He feels much better, edema is improving, and weight is down substantially. We will stop Zaroxolyn for now, try to continue Demadex which he had tolerated previously at similar dose, although we have divided twice a day. Follow-up BMET with clinical visit in the next 2 weeks.

## 2014-05-04 NOTE — Progress Notes (Signed)
Reason for visit: Diastolic heart failure  Clinical Summary [name] is an 81 y.o.male last seen in December 2015 having missed previous follow-up visits including the CHF clinic. He was up greater than 20 pounds and diuretic therapy was modified to include Demadex 40 mg twice daily and Zaroxolyn 2.5 mg twice a week. He also tells me that he has been paying more attention to his fluid intake and reducing portion size in his diet.  Weight has come down substantially from 361 pounds to 337 pounds. Follow-up lab work however shows acute on chronic renal insufficiency with creatinine up to 2.7 from 1.8 (July 2015), normal potassium of 4.0. Reviewed the results.  He feels much better, leg edema and lymphedema is improving consistently. He is wearing his compression hose today.   Allergies  Allergen Reactions  . Piroxicam Hives    unknown  . Ambien [Zolpidem Tartrate]     Makes him cuss like a sailor  . Lyrica [Pregabalin]     Causes him to have fluid  . Penicillins Hives    nightmares  . Sulfonamide Derivatives     Night sweats    Current Outpatient Prescriptions  Medication Sig Dispense Refill  . acetaminophen (TYLENOL) 325 MG tablet Take 2 tablets (650 mg total) by mouth every 6 (six) hours as needed for mild pain, moderate pain or headache.    Marland Kitchen amLODipine (NORVASC) 10 MG tablet Take 1 tablet (10 mg total) by mouth daily. 30 tablet 6  . aspirin 81 MG chewable tablet Chew 2 tablets (162 mg total) by mouth daily.    . calcium-vitamin D (OSCAL WITH D) 500-200 MG-UNIT per tablet Take 1 tablet by mouth 2 (two) times daily.    . cetirizine (ZYRTEC) 10 MG tablet Take 10 mg by mouth every evening.     . Cholecalciferol (D-3-5) 5000 UNITS capsule Take 5,000 Units by mouth daily.     . fluticasone (FLONASE) 50 MCG/ACT nasal spray Place 2 sprays into the nose daily.     Marland Kitchen gabapentin (NEURONTIN) 300 MG capsule Take 1 capsule (300 mg total) by mouth 2 (two) times daily. 60 capsule 4  .  HYDROcodone-acetaminophen (NORCO/VICODIN) 5-325 MG per tablet Take 1-2 tablets by mouth every 6 (six) hours as needed for moderate pain. 30 tablet 0  . insulin detemir (LEVEMIR) 100 UNIT/ML injection Inject 0.15 mLs (15 Units total) into the skin at bedtime. 10 mL 12  . labetalol (NORMODYNE) 200 MG tablet Take 1 tablet (200 mg total) by mouth 2 (two) times daily. 60 tablet 1  . latanoprost (XALATAN) 0.005 % ophthalmic solution Place 1 drop into the right eye at bedtime.     . lidocaine (LIDODERM) 5 % Place 1 patch onto the skin daily as needed (for pain). Remove & Discard patch within 12 hours or as directed by MD.    . montelukast (SINGULAIR) 10 MG tablet Take 10 mg by mouth at bedtime.     . Multiple Vitamin (MULTIVITAMIN) tablet Take 1 tablet by mouth daily.    Marland Kitchen nystatin (MYCOSTATIN) 100000 UNIT/ML suspension Take 5 mLs by mouth 4 (four) times daily.    . potassium chloride SA (K-DUR,KLOR-CON) 20 MEQ tablet Take 2 tablets (40 mEq total) by mouth daily. 60 tablet 0  . saccharomyces boulardii (FLORASTOR) 250 MG capsule Take 1 capsule (250 mg total) by mouth 2 (two) times daily. 60 capsule 0  . sodium chloride (OCEAN) 0.65 % SOLN nasal spray Place 1 spray into both nostrils as needed  for congestion.  0  . spironolactone (ALDACTONE) 25 MG tablet Take 1 tablet (25 mg total) by mouth daily.    . Tamsulosin HCl (FLOMAX) 0.4 MG CAPS Take 0.4 mg by mouth at bedtime.     . torsemide (DEMADEX) 20 MG tablet Take 2 tablets (40 mg total) by mouth 2 (two) times daily. 120 tablet 3  . traMADol (ULTRAM) 50 MG tablet Take 1 tablet (50 mg total) by mouth every 6 (six) hours as needed for severe pain. 30 tablet   . vitamin B-12 1000 MCG tablet Take 1 tablet (1,000 mcg total) by mouth daily. 30 tablet 0   No current facility-administered medications for this visit.    Past Medical History  Diagnosis Date  . PSVT (paroxysmal supraventricular tachycardia)     AVNRT  . Coronary atherosclerosis of native coronary  artery     Nonobstructive, LVEF 65%  . Hyperlipidemia   . Morbid obesity   . Arthritis   . Type 2 diabetes mellitus   . Vertigo   . Venous insufficiency   . Sleep apnea, obstructive   . Chronic diastolic heart failure     Social History Mr. Upperman reports that he has never smoked. He has never used smokeless tobacco. Mr. Falletta reports that he does not drink alcohol.  Review of Systems Complete review of systems negative except as otherwise outlined in the clinical summary and also the following.  Physical Examination Filed Vitals:   05/04/14 1437  BP: 122/69  Pulse: 64   Filed Weights   05/04/14 1437  Weight: 337 lb 12.8 oz (153.225 kg)   Morbidly obese, no distress.  HEENT: Conjunctiva and lids normal, oropharynx clear.  Neck: Supple, increased girth, difficult to appreciate JVP, no carotid bruits, no thyromegaly.  Lungs: Clear to auscultation, decreased at bases, nonlabored breathing at rest.  Cardiac: Regular rate and rhythm, no S3, 2/6 systolic murmur, no pericardial rub.  Abdomen: Soft, nontender, obesey, bowel sounds present.  Extremities: Chronic but improving edema and lymphedema. Compression hose in place..  Skin: Warm and dry.  Musculoskeletal: No kyphosis.  Neuropsychiatric: Alert and oriented x3, affect grossly appropriate.   Problem List and Plan   Acute on chronic diastolic heart failure He feels much better, edema is improving, and weight is down substantially. We will stop Zaroxolyn for now, try to continue Demadex which he had tolerated previously at similar dose, although we have divided twice a day. Follow-up BMET with clinical visit in the next 2 weeks.  Essential hypertension, benign Blood pressure control is good today.    Satira Sark, M.D., F.A.C.C.

## 2014-05-11 DIAGNOSIS — B351 Tinea unguium: Secondary | ICD-10-CM | POA: Diagnosis not present

## 2014-05-11 DIAGNOSIS — E1151 Type 2 diabetes mellitus with diabetic peripheral angiopathy without gangrene: Secondary | ICD-10-CM | POA: Diagnosis not present

## 2014-05-22 ENCOUNTER — Ambulatory Visit: Payer: Medicare Other | Admitting: Cardiology

## 2014-05-30 DIAGNOSIS — T798XXA Other early complications of trauma, initial encounter: Secondary | ICD-10-CM | POA: Diagnosis not present

## 2014-05-30 DIAGNOSIS — L97909 Non-pressure chronic ulcer of unspecified part of unspecified lower leg with unspecified severity: Secondary | ICD-10-CM | POA: Diagnosis not present

## 2014-05-30 DIAGNOSIS — I5033 Acute on chronic diastolic (congestive) heart failure: Secondary | ICD-10-CM | POA: Diagnosis not present

## 2014-05-30 DIAGNOSIS — B958 Unspecified staphylococcus as the cause of diseases classified elsewhere: Secondary | ICD-10-CM | POA: Diagnosis not present

## 2014-06-01 ENCOUNTER — Telehealth: Payer: Self-pay | Admitting: *Deleted

## 2014-06-01 ENCOUNTER — Ambulatory Visit: Payer: Medicare Other | Admitting: Cardiology

## 2014-06-01 NOTE — Telephone Encounter (Signed)
-----   Message from Satira Sark, MD sent at 05/31/2014  3:59 PM EST ----- Reviewed. Creatinine has come down from 2.7-1.9 with change in diuretics, this is closer to his prior baseline. Continue same for now.

## 2014-06-01 NOTE — Telephone Encounter (Signed)
Patient informed. 

## 2014-06-21 ENCOUNTER — Ambulatory Visit (INDEPENDENT_AMBULATORY_CARE_PROVIDER_SITE_OTHER): Payer: Medicare Other | Admitting: Cardiology

## 2014-06-21 ENCOUNTER — Encounter: Payer: Self-pay | Admitting: Cardiology

## 2014-06-21 VITALS — BP 140/82 | HR 69 | Ht 72.0 in | Wt 355.0 lb

## 2014-06-21 DIAGNOSIS — I5033 Acute on chronic diastolic (congestive) heart failure: Secondary | ICD-10-CM | POA: Diagnosis not present

## 2014-06-21 DIAGNOSIS — I890 Lymphedema, not elsewhere classified: Secondary | ICD-10-CM

## 2014-06-21 DIAGNOSIS — N183 Chronic kidney disease, stage 3 unspecified: Secondary | ICD-10-CM

## 2014-06-21 DIAGNOSIS — I2510 Atherosclerotic heart disease of native coronary artery without angina pectoris: Secondary | ICD-10-CM | POA: Diagnosis not present

## 2014-06-21 MED ORDER — METOLAZONE 2.5 MG PO TABS: 2.5000 mg | ORAL_TABLET | ORAL | Status: DC

## 2014-06-21 NOTE — Patient Instructions (Addendum)
Your physician recommends that you schedule a follow-up appointment in: 1 month. Your physician has recommended you make the following change in your medication:  Add zaroxolyn 2.5 mg twice weekly. Continue all other medications the same. Your physician recommends that you have lab work in 1 month just before your next visit to check your BMET.  You lab order has been given to you today during your visit.

## 2014-06-21 NOTE — Progress Notes (Signed)
Cardiology Office Note  Date: 06/21/2014   ID: [name], DOB [birth year], MRN [id number]  PCP: Delphina Cahill, MD  Primary Cardiologist: Rozann Lesches, MD   Chief Complaint  Patient presents with  . Diastolic heart failure    History of Present Illness: [name] is an 81 y.o. male last seen in January. At that visit we stopped Zaroxolyn and continued Demadex. He had had a good diuresis showing acute on chronic renal insufficiency. He comes back in today having missed his last visit due to the weather. Weight has gone back up essentially to where it was before. He reports compliance with Demadex, has been fairly limited in terms of ambulation reporting an ulcer on his left leg that is being treated by his dermatologist.  He does not endorse any angina symptoms or rapid palpitations.   Past Medical History  Diagnosis Date  . PSVT (paroxysmal supraventricular tachycardia)     AVNRT  . Coronary atherosclerosis of native coronary artery     Nonobstructive, LVEF 65%  . Hyperlipidemia   . Morbid obesity   . Arthritis   . Type 2 diabetes mellitus   . Vertigo   . Venous insufficiency   . Sleep apnea, obstructive   . Chronic diastolic heart failure     Current Outpatient Prescriptions  Medication Sig Dispense Refill  . acetaminophen (TYLENOL) 325 MG tablet Take 2 tablets (650 mg total) by mouth every 6 (six) hours as needed for mild pain, moderate pain or headache.    Marland Kitchen amLODipine (NORVASC) 10 MG tablet Take 1 tablet (10 mg total) by mouth daily. 30 tablet 6  . aspirin 81 MG chewable tablet Chew 2 tablets (162 mg total) by mouth daily.    . calcium-vitamin D (OSCAL WITH D) 500-200 MG-UNIT per tablet Take 1 tablet by mouth 2 (two) times daily.    . cetirizine (ZYRTEC) 10 MG tablet Take 10 mg by mouth every evening.     . Cholecalciferol (D-3-5) 5000 UNITS capsule Take 5,000 Units by mouth daily.     . fluticasone (FLONASE) 50 MCG/ACT nasal spray Place 2 sprays into the  nose daily.     Marland Kitchen gabapentin (NEURONTIN) 300 MG capsule Take 1 capsule (300 mg total) by mouth 2 (two) times daily. 60 capsule 4  . HYDROcodone-acetaminophen (NORCO/VICODIN) 5-325 MG per tablet Take 1-2 tablets by mouth every 6 (six) hours as needed for moderate pain. 30 tablet 0  . insulin detemir (LEVEMIR) 100 UNIT/ML injection Inject 0.15 mLs (15 Units total) into the skin at bedtime. 10 mL 12  . labetalol (NORMODYNE) 200 MG tablet Take 1 tablet (200 mg total) by mouth 2 (two) times daily. 60 tablet 1  . latanoprost (XALATAN) 0.005 % ophthalmic solution Place 1 drop into the right eye at bedtime.     . lidocaine (LIDODERM) 5 % Place 1 patch onto the skin daily as needed (for pain). Remove & Discard patch within 12 hours or as directed by MD.    . montelukast (SINGULAIR) 10 MG tablet Take 10 mg by mouth at bedtime.     . Multiple Vitamin (MULTIVITAMIN) tablet Take 1 tablet by mouth daily.    Marland Kitchen nystatin (MYCOSTATIN) 100000 UNIT/ML suspension Take 5 mLs by mouth 4 (four) times daily.    . potassium chloride SA (K-DUR,KLOR-CON) 20 MEQ tablet Take 2 tablets (40 mEq total) by mouth daily. 60 tablet 0  . saccharomyces boulardii (FLORASTOR) 250 MG capsule Take 1 capsule (250 mg total) by  mouth 2 (two) times daily. 60 capsule 0  . sodium chloride (OCEAN) 0.65 % SOLN nasal spray Place 1 spray into both nostrils as needed for congestion.  0  . spironolactone (ALDACTONE) 25 MG tablet Take 1 tablet (25 mg total) by mouth daily.    . Tamsulosin HCl (FLOMAX) 0.4 MG CAPS Take 0.4 mg by mouth at bedtime.     . torsemide (DEMADEX) 20 MG tablet Take 2 tablets (40 mg total) by mouth 2 (two) times daily. 120 tablet 3  . traMADol (ULTRAM) 50 MG tablet Take 1 tablet (50 mg total) by mouth every 6 (six) hours as needed for severe pain. 30 tablet   . vitamin B-12 1000 MCG tablet Take 1 tablet (1,000 mcg total) by mouth daily. 30 tablet 0  . metolazone (ZAROXOLYN) 2.5 MG tablet Take 1 tablet (2.5 mg total) by mouth 2  (two) times a week. 30 tablet 3   No current facility-administered medications for this visit.    Allergies:  Piroxicam; Ambien; Lyrica; Penicillins; and Sulfonamide derivatives   Social History: The patient  reports that he has never smoked. He has never used smokeless tobacco. He reports that he does not drink alcohol or use illicit drugs.   ROS:  Please see the history of present illness. Otherwise, complete review of systems is positive for none.  All other systems are reviewed and negative.    Physical Exam: VS:  BP 140/82 mmHg  Pulse 69  Ht 6' (1.829 m)  Wt 355 lb (161.027 kg)  BMI 48.14 kg/m2  SpO2 96%, BMI Body mass index is 48.14 kg/(m^2).  Wt Readings from Last 3 Encounters:  06/21/14 355 lb (161.027 kg)  05/04/14 337 lb 12.8 oz (153.225 kg)  04/17/14 361 lb (163.749 kg)     Morbidly obese, no distress.  HEENT: Conjunctiva normal, oropharynx clear.  Neck: Increased girth, difficult to appreciate JVP, no carotid bruits, no thyromegaly.  Lungs: Clear to auscultation, decreased at bases, nonlabored breathing at rest.  Cardiac: Regular rate and rhythm, no S3, 2/6 systolic murmur, no pericardial rub.  Abdomen: Obese, bowel sounds present.  Extremities: Chronic edema and lymphedema. Skin: Warm and dry.  Musculoskeletal: No kyphosis.  Neuropsychiatric: Alert and oriented x3, affect grossly appropriate.   ECG: ECG is not ordered today.   Recent Labwork:  Follow-up lab work in February showed BUN 31, creatinine down from 2.7-1.9, and potassium 4.1.  Other Studies Reviewed Today:  Echocardiogram 04/15/2013: Study Conclusions  - Left ventricle: Study is technically severely limited. Overall EF is normal. Images were inadequate for LV wall motion assessment. - Left atrium: The atrium was moderately dilated. - Right ventricle: Not able to assess RV function due to poor visualization. Not able to assess RV size due to poor visualization. -  Pulmonary arteries: PA peak pressure: 27mm Hg (S).   Assessment and Plan:  1. Acute on chronic diastolic heart failure. Plan is to continue Demadex 40 mg twice daily, start back on Zaroxolyn 2.5 mg twice a week. This has been effective for him in the past, although need to keep an eye on renal function. Follow-up with BMET arranged.  2. CKD stage 3. Last creatinine 1.9.  3. Chronic lymphedema.  4. History of nonobstructive CAD, no active angina.  Current medicines are reviewed at length with the patient today.  The patient does not have concerns regarding medicines.   Orders Placed This Encounter  Procedures  . Basic metabolic panel    Disposition: FU with me in  1 month.   Signed, Satira Sark, MD, [facility] 06/21/2014 1:35 PM    [facility] [place], [place], [place] [address] Phone: [phone]; Fax: [phone]

## 2014-06-26 DIAGNOSIS — E1122 Type 2 diabetes mellitus with diabetic chronic kidney disease: Secondary | ICD-10-CM | POA: Diagnosis not present

## 2014-06-27 DIAGNOSIS — L83 Acanthosis nigricans: Secondary | ICD-10-CM | POA: Diagnosis not present

## 2014-06-27 DIAGNOSIS — D485 Neoplasm of uncertain behavior of skin: Secondary | ICD-10-CM | POA: Diagnosis not present

## 2014-06-27 DIAGNOSIS — L820 Inflamed seborrheic keratosis: Secondary | ICD-10-CM | POA: Diagnosis not present

## 2014-06-27 DIAGNOSIS — L97909 Non-pressure chronic ulcer of unspecified part of unspecified lower leg with unspecified severity: Secondary | ICD-10-CM | POA: Diagnosis not present

## 2014-07-04 DIAGNOSIS — E785 Hyperlipidemia, unspecified: Secondary | ICD-10-CM | POA: Diagnosis not present

## 2014-07-04 DIAGNOSIS — R601 Generalized edema: Secondary | ICD-10-CM | POA: Diagnosis not present

## 2014-07-04 DIAGNOSIS — E1122 Type 2 diabetes mellitus with diabetic chronic kidney disease: Secondary | ICD-10-CM | POA: Diagnosis not present

## 2014-07-04 DIAGNOSIS — I10 Essential (primary) hypertension: Secondary | ICD-10-CM | POA: Diagnosis not present

## 2014-07-05 DIAGNOSIS — Z23 Encounter for immunization: Secondary | ICD-10-CM | POA: Diagnosis not present

## 2014-07-18 DIAGNOSIS — D485 Neoplasm of uncertain behavior of skin: Secondary | ICD-10-CM | POA: Diagnosis not present

## 2014-07-18 DIAGNOSIS — L83 Acanthosis nigricans: Secondary | ICD-10-CM | POA: Diagnosis not present

## 2014-07-18 DIAGNOSIS — I5033 Acute on chronic diastolic (congestive) heart failure: Secondary | ICD-10-CM | POA: Diagnosis not present

## 2014-07-18 DIAGNOSIS — L820 Inflamed seborrheic keratosis: Secondary | ICD-10-CM | POA: Diagnosis not present

## 2014-07-18 DIAGNOSIS — L97909 Non-pressure chronic ulcer of unspecified part of unspecified lower leg with unspecified severity: Secondary | ICD-10-CM | POA: Diagnosis not present

## 2014-07-19 ENCOUNTER — Telehealth: Payer: Self-pay | Admitting: *Deleted

## 2014-07-19 ENCOUNTER — Ambulatory Visit: Payer: Medicare Other | Admitting: Cardiology

## 2014-07-19 NOTE — Telephone Encounter (Signed)
Pt made aware and does not want to change any dose of diuretics per he has "hole" on side of leg that his kept him from exercising and controlling his fluid and it has not healing in fact has grown in size that is being managed by Demonologist. Pt had biopsy yesterday and would like to hold off on changing medications due to improvement in swelling and loss of some weight. Will forward to Dr. Domenic Polite.

## 2014-07-19 NOTE — Telephone Encounter (Signed)
-----   Message from Satira Sark, MD sent at 07/19/2014  1:21 PM EDT ----- Reviewed. Creatinine has gone up from 1.9-2.4, potassium normal at 4.4. This was after resuming Zaroxolyn twice a week on top of his Demadex. Check and see how he is doing in terms of edema and weight gain. We may need to modify the Zaroxolyn.

## 2014-07-20 DIAGNOSIS — B351 Tinea unguium: Secondary | ICD-10-CM | POA: Diagnosis not present

## 2014-07-20 DIAGNOSIS — E1151 Type 2 diabetes mellitus with diabetic peripheral angiopathy without gangrene: Secondary | ICD-10-CM | POA: Diagnosis not present

## 2014-08-14 ENCOUNTER — Telehealth: Payer: Self-pay | Admitting: Cardiology

## 2014-08-14 NOTE — Telephone Encounter (Signed)
Pt says he will call Dr. Nevada Crane for further O2 management. Pt says O2 remains normal when he is not active at 95-98%. Only when he walks for a short period of time. Pt voices understanding of ER, but says this has been going on for 3 weeks. Again told pt to f/u with Dr. Nevada Crane for CPAP. Pt will call pcp for further instructions and will see Dr. Domenic Polite 08/15/14

## 2014-08-14 NOTE — Telephone Encounter (Signed)
Pt c/o SOB on exertion says O2 is 80-85%. When laying in bed 96-98%. Wears CPap machine at night. Pt denies any other symptoms and wanted MD aware. Pt is scheduled to see Dr. Domenic Polite 08/15/14. Will forward

## 2014-08-14 NOTE — Telephone Encounter (Signed)
Pt states he is currently not swelling and weight remains same. Will forward to Dr. Domenic Polite

## 2014-08-14 NOTE — Telephone Encounter (Signed)
Thank you for the update. If his weight and edema have also been getting worse, this may be a situation where he needs to be hospitalized for IV diuretics. Please see how he has been doing in terms of weight and edema on his diuretics.

## 2014-08-14 NOTE — Telephone Encounter (Signed)
Patient called wanting a nurse to call him in reference to following two things.  1.  No matter what the patient eats he has gas 2.  His left hip and thigh hurt and he doesn't know what has cause it to

## 2014-08-14 NOTE — Telephone Encounter (Signed)
If that is the case, and we do not suspect that this is related to diastolic heart failure, that this may be more of a pulmonary issue for him. If he is already on CPAP at nighttime, it may be that he needs to be using oxygen at home when he is active. This is something that Dr. Nevada Crane should be able to take care of for him. We are not managing his CPAP. If this is more of an acute change, and he is more short of breath at baseline than normal, then I would recommend that he go to the ER for more acute evaluation.

## 2014-08-15 ENCOUNTER — Encounter: Payer: Self-pay | Admitting: Cardiology

## 2014-08-15 ENCOUNTER — Ambulatory Visit (INDEPENDENT_AMBULATORY_CARE_PROVIDER_SITE_OTHER): Payer: Medicare Other | Admitting: Cardiology

## 2014-08-15 VITALS — BP 138/70 | HR 68 | Ht 72.0 in | Wt 375.1 lb

## 2014-08-15 DIAGNOSIS — I471 Supraventricular tachycardia, unspecified: Secondary | ICD-10-CM

## 2014-08-15 DIAGNOSIS — I2510 Atherosclerotic heart disease of native coronary artery without angina pectoris: Secondary | ICD-10-CM | POA: Diagnosis not present

## 2014-08-15 DIAGNOSIS — I5033 Acute on chronic diastolic (congestive) heart failure: Secondary | ICD-10-CM | POA: Diagnosis not present

## 2014-08-15 DIAGNOSIS — N183 Chronic kidney disease, stage 3 unspecified: Secondary | ICD-10-CM

## 2014-08-15 DIAGNOSIS — I890 Lymphedema, not elsewhere classified: Secondary | ICD-10-CM | POA: Diagnosis not present
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
## 2014-08-15 NOTE — Telephone Encounter (Signed)
Patient is currently being seen by provider now. Can discuss at office visit.

## 2014-08-15 NOTE — Patient Instructions (Signed)
   Your physician has recommended you make the following change in your medication:  Please take an extra metalozone and torsemide today.   You will be admitted to [facility] tomorrow 08/16/14. Someone will be contacting you about the time to be there tomorrow.

## 2014-08-16 ENCOUNTER — Encounter (HOSPITAL_COMMUNITY): Payer: Self-pay | Admitting: *Deleted

## 2014-08-16 ENCOUNTER — Observation Stay (HOSPITAL_COMMUNITY): Payer: Medicare Other

## 2014-08-16 ENCOUNTER — Inpatient Hospital Stay (HOSPITAL_COMMUNITY)
Admission: AD | Admit: 2014-08-16 | Discharge: 2014-08-23 | DRG: 292 | Disposition: A | Discharge status: Home / Self Care | Payer: Medicare Other | Source: Ambulatory Visit | Attending: Internal Medicine | Admitting: Internal Medicine

## 2014-08-16 DIAGNOSIS — D61818 Other pancytopenia: Secondary | ICD-10-CM | POA: Diagnosis present

## 2014-08-16 DIAGNOSIS — R0602 Shortness of breath: Secondary | ICD-10-CM | POA: Diagnosis not present

## 2014-08-16 DIAGNOSIS — Z7982 Long term (current) use of aspirin: Secondary | ICD-10-CM | POA: Diagnosis not present

## 2014-08-16 DIAGNOSIS — Z7901 Long term (current) use of anticoagulants: Secondary | ICD-10-CM | POA: Diagnosis not present

## 2014-08-16 DIAGNOSIS — G4733 Obstructive sleep apnea (adult) (pediatric): Secondary | ICD-10-CM | POA: Diagnosis present

## 2014-08-16 DIAGNOSIS — G4730 Sleep apnea, unspecified: Secondary | ICD-10-CM | POA: Diagnosis present

## 2014-08-16 DIAGNOSIS — L8992 Pressure ulcer of unspecified site, stage 2: Secondary | ICD-10-CM

## 2014-08-16 DIAGNOSIS — E782 Mixed hyperlipidemia: Secondary | ICD-10-CM | POA: Diagnosis present

## 2014-08-16 DIAGNOSIS — Z6841 Body Mass Index (BMI) 40.0 and over, adult: Secondary | ICD-10-CM | POA: Diagnosis not present

## 2014-08-16 DIAGNOSIS — M1990 Unspecified osteoarthritis, unspecified site: Secondary | ICD-10-CM | POA: Diagnosis present

## 2014-08-16 DIAGNOSIS — I2510 Atherosclerotic heart disease of native coronary artery without angina pectoris: Secondary | ICD-10-CM | POA: Diagnosis present

## 2014-08-16 DIAGNOSIS — Z880 Allergy status to penicillin: Secondary | ICD-10-CM | POA: Diagnosis not present

## 2014-08-16 DIAGNOSIS — L89892 Pressure ulcer of other site, stage 2: Secondary | ICD-10-CM | POA: Diagnosis present

## 2014-08-16 DIAGNOSIS — I129 Hypertensive chronic kidney disease with stage 1 through stage 4 chronic kidney disease, or unspecified chronic kidney disease: Secondary | ICD-10-CM | POA: Diagnosis present

## 2014-08-16 DIAGNOSIS — I872 Venous insufficiency (chronic) (peripheral): Secondary | ICD-10-CM | POA: Diagnosis present

## 2014-08-16 DIAGNOSIS — I5033 Acute on chronic diastolic (congestive) heart failure: Secondary | ICD-10-CM | POA: Diagnosis present

## 2014-08-16 DIAGNOSIS — Z882 Allergy status to sulfonamides status: Secondary | ICD-10-CM

## 2014-08-16 DIAGNOSIS — I509 Heart failure, unspecified: Secondary | ICD-10-CM | POA: Diagnosis not present

## 2014-08-16 DIAGNOSIS — Z888 Allergy status to other drugs, medicaments and biological substances status: Secondary | ICD-10-CM | POA: Diagnosis not present

## 2014-08-16 DIAGNOSIS — I471 Supraventricular tachycardia: Secondary | ICD-10-CM | POA: Diagnosis present

## 2014-08-16 DIAGNOSIS — Z794 Long term (current) use of insulin: Secondary | ICD-10-CM | POA: Diagnosis not present

## 2014-08-16 DIAGNOSIS — I10 Essential (primary) hypertension: Secondary | ICD-10-CM | POA: Diagnosis present

## 2014-08-16 DIAGNOSIS — E1142 Type 2 diabetes mellitus with diabetic polyneuropathy: Secondary | ICD-10-CM | POA: Diagnosis present

## 2014-08-16 DIAGNOSIS — N183 Chronic kidney disease, stage 3 unspecified: Secondary | ICD-10-CM | POA: Diagnosis present

## 2014-08-16 DIAGNOSIS — N184 Chronic kidney disease, stage 4 (severe): Secondary | ICD-10-CM | POA: Diagnosis present

## 2014-08-16 LAB — BASIC METABOLIC PANEL
Anion gap: 11 (ref 5–15)
BUN: 41 mg/dL — AB (ref 6–23)
CO2: 25 mmol/L (ref 19–32)
Calcium: 9 mg/dL (ref 8.4–10.5)
Chloride: 104 mmol/L (ref 96–112)
Creatinine, Ser: 2.51 mg/dL — ABNORMAL HIGH (ref 0.50–1.35)
GFR calc Af Amer: 26 mL/min — ABNORMAL LOW (ref >90)
GFR, EST NON AFRICAN AMERICAN: 22 mL/min — AB (ref >90)
Glucose, Bld: 115 mg/dL — ABNORMAL HIGH (ref 70–99)
POTASSIUM: 3.8 mmol/L (ref 3.5–5.1)
SODIUM: 140 mmol/L (ref 135–145)

## 2014-08-16 LAB — CBC
HCT: 27.1 % — ABNORMAL LOW (ref 39.0–52.0)
Hemoglobin: 8.8 g/dL — ABNORMAL LOW (ref 13.0–17.0)
MCH: 29.4 pg (ref 26.0–34.0)
MCHC: 32.5 g/dL (ref 30.0–36.0)
MCV: 90.6 fL (ref 78.0–100.0)
Platelets: 81 10*3/uL — ABNORMAL LOW (ref 150–400)
RBC: 2.99 MIL/uL — ABNORMAL LOW (ref 4.22–5.81)
RDW: 16.4 % — AB (ref 11.5–15.5)
WBC: 3.3 10*3/uL — AB (ref 4.0–10.5)

## 2014-08-16 LAB — MAGNESIUM: Magnesium: 2.1 mg/dL (ref 1.5–2.5)

## 2014-08-16 LAB — PROTIME-INR
INR: 1.21 (ref 0.00–1.49)
Prothrombin Time: 15.4 seconds — ABNORMAL HIGH (ref 11.6–15.2)

## 2014-08-16 LAB — GLUCOSE, CAPILLARY: Glucose-Capillary: 156 mg/dL — ABNORMAL HIGH (ref 70–99)

## 2014-08-16 LAB — BRAIN NATRIURETIC PEPTIDE: B NATRIURETIC PEPTIDE 5: 869.7 pg/mL — AB (ref 0.0–100.0)

## 2014-08-16 MED ORDER — HEPARIN SODIUM (PORCINE) 5000 UNIT/ML IJ SOLN
5000.0000 [IU] | Freq: Three times a day (TID) | INTRAMUSCULAR | Status: DC
  Administered 2014-08-16 – 2014-08-23 (×19): 5000 [IU] via SUBCUTANEOUS
  Filled 2014-08-16 (×22): qty 1

## 2014-08-16 MED ORDER — AMLODIPINE BESYLATE 10 MG PO TABS
10.0000 mg | ORAL_TABLET | Freq: Every day | ORAL | Status: DC
  Administered 2014-08-17 – 2014-08-23 (×7): 10 mg via ORAL
  Filled 2014-08-16 (×7): qty 1

## 2014-08-16 MED ORDER — CALCIUM CARBONATE-VITAMIN D 500-200 MG-UNIT PO TABS
1.0000 | ORAL_TABLET | Freq: Two times a day (BID) | ORAL | Status: DC
  Administered 2014-08-17 – 2014-08-19 (×6): 1 via ORAL
  Filled 2014-08-16 (×7): qty 1

## 2014-08-16 MED ORDER — FUROSEMIDE 10 MG/ML IJ SOLN
10.0000 mg/h | INTRAVENOUS | Status: DC
  Administered 2014-08-16 – 2014-08-22 (×7): 10 mg/h via INTRAVENOUS
  Filled 2014-08-16 (×14): qty 25

## 2014-08-16 MED ORDER — LATANOPROST 0.005 % OP SOLN
1.0000 [drp] | Freq: Every day | OPHTHALMIC | Status: DC
  Administered 2014-08-16 – 2014-08-22 (×7): 1 [drp] via OPHTHALMIC
  Filled 2014-08-16: qty 2.5

## 2014-08-16 MED ORDER — ACETAMINOPHEN 325 MG PO TABS: 650.0000 mg | ORAL_TABLET | ORAL | Status: DC | PRN

## 2014-08-16 MED ORDER — INSULIN DETEMIR 100 UNIT/ML ~~LOC~~ SOLN
28.0000 [IU] | Freq: Every day | SUBCUTANEOUS | Status: DC
  Administered 2014-08-16 – 2014-08-22 (×7): 28 [IU] via SUBCUTANEOUS
  Filled 2014-08-16 (×8): qty 0.28

## 2014-08-16 MED ORDER — LORATADINE 10 MG PO TABS
10.0000 mg | ORAL_TABLET | Freq: Every evening | ORAL | Status: DC
  Administered 2014-08-16 – 2014-08-22 (×7): 10 mg via ORAL
  Filled 2014-08-16 (×8): qty 1

## 2014-08-16 MED ORDER — ADULT MULTIVITAMIN W/MINERALS CH
1.0000 | ORAL_TABLET | Freq: Every day | ORAL | Status: DC
  Administered 2014-08-17 – 2014-08-23 (×7): 1 via ORAL
  Filled 2014-08-16 (×7): qty 1

## 2014-08-16 MED ORDER — FUROSEMIDE 10 MG/ML IJ SOLN
80.0000 mg | Freq: Once | INTRAMUSCULAR | Status: AC
  Administered 2014-08-16: 80 mg via INTRAVENOUS
  Filled 2014-08-16: qty 8

## 2014-08-16 MED ORDER — SALINE SPRAY 0.65 % NA SOLN
1.0000 | NASAL | Status: DC | PRN
  Administered 2014-08-18: 1 via NASAL
  Filled 2014-08-16: qty 44

## 2014-08-16 MED ORDER — VITAMIN B-12 1000 MCG PO TABS
1000.0000 ug | ORAL_TABLET | Freq: Every day | ORAL | Status: DC
  Administered 2014-08-17 – 2014-08-23 (×7): 1000 ug via ORAL
  Filled 2014-08-16 (×7): qty 1

## 2014-08-16 MED ORDER — ALIGN PO CAPS: 1.0000 | ORAL_CAPSULE | Freq: Every day | ORAL | Status: DC

## 2014-08-16 MED ORDER — SODIUM CHLORIDE 0.9 % IJ SOLN: 3.0000 mL | INTRAMUSCULAR | Status: DC | PRN

## 2014-08-16 MED ORDER — POTASSIUM CHLORIDE CRYS ER 20 MEQ PO TBCR
20.0000 meq | EXTENDED_RELEASE_TABLET | Freq: Two times a day (BID) | ORAL | Status: DC
  Administered 2014-08-16 – 2014-08-22 (×13): 20 meq via ORAL
  Filled 2014-08-16 (×15): qty 1

## 2014-08-16 MED ORDER — ONDANSETRON HCL 4 MG/2ML IJ SOLN
4.0000 mg | Freq: Four times a day (QID) | INTRAMUSCULAR | Status: DC | PRN
  Administered 2014-08-19: 4 mg via INTRAVENOUS
  Filled 2014-08-16: qty 2

## 2014-08-16 MED ORDER — FLUTICASONE PROPIONATE 50 MCG/ACT NA SUSP
2.0000 | Freq: Every day | NASAL | Status: DC
  Administered 2014-08-17 – 2014-08-23 (×7): 2 via NASAL
  Filled 2014-08-16: qty 16

## 2014-08-16 MED ORDER — INSULIN ASPART 100 UNIT/ML ~~LOC~~ SOLN
0.0000 [IU] | Freq: Three times a day (TID) | SUBCUTANEOUS | Status: DC
  Administered 2014-08-17 (×3): 1 [IU] via SUBCUTANEOUS
  Administered 2014-08-18: 2 [IU] via SUBCUTANEOUS
  Administered 2014-08-18: 1 [IU] via SUBCUTANEOUS
  Administered 2014-08-19 – 2014-08-20 (×3): 2 [IU] via SUBCUTANEOUS
  Administered 2014-08-20 – 2014-08-22 (×6): 1 [IU] via SUBCUTANEOUS

## 2014-08-16 MED ORDER — SODIUM CHLORIDE 0.9 % IV SOLN: 250.0000 mL | INTRAVENOUS | Status: DC | PRN

## 2014-08-16 MED ORDER — SODIUM CHLORIDE 0.9 % IJ SOLN
3.0000 mL | Freq: Two times a day (BID) | INTRAMUSCULAR | Status: DC
  Administered 2014-08-16 – 2014-08-20 (×7): 3 mL via INTRAVENOUS

## 2014-08-16 MED ORDER — RISAQUAD PO CAPS
1.0000 | ORAL_CAPSULE | Freq: Every day | ORAL | Status: DC
  Administered 2014-08-17 – 2014-08-23 (×7): 1 via ORAL
  Filled 2014-08-16 (×7): qty 1

## 2014-08-16 MED ORDER — ASPIRIN 81 MG PO CHEW
162.0000 mg | CHEWABLE_TABLET | Freq: Every day | ORAL | Status: DC
  Administered 2014-08-17 – 2014-08-23 (×7): 162 mg via ORAL
  Filled 2014-08-16 (×7): qty 2

## 2014-08-16 MED ORDER — TAMSULOSIN HCL 0.4 MG PO CAPS
0.4000 mg | ORAL_CAPSULE | Freq: Every day | ORAL | Status: DC
Start: 2014-08-16 — End: 2014-08-23
  Administered 2014-08-16 – 2014-08-22 (×7): 0.4 mg via ORAL
  Filled 2014-08-16 (×8): qty 1

## 2014-08-16 MED ORDER — HYDROCODONE-ACETAMINOPHEN 5-325 MG PO TABS
1.0000 | ORAL_TABLET | Freq: Four times a day (QID) | ORAL | Status: DC | PRN
  Administered 2014-08-17 – 2014-08-23 (×16): 1 via ORAL
  Filled 2014-08-16 (×16): qty 1

## 2014-08-16 MED ORDER — VITAMIN D3 25 MCG (1000 UNIT) PO TABS
5000.0000 [IU] | ORAL_TABLET | Freq: Every day | ORAL | Status: DC
  Administered 2014-08-17 – 2014-08-23 (×7): 5000 [IU] via ORAL
  Filled 2014-08-16 (×7): qty 5

## 2014-08-16 MED ORDER — LABETALOL HCL 200 MG PO TABS
200.0000 mg | ORAL_TABLET | Freq: Two times a day (BID) | ORAL | Status: DC
  Administered 2014-08-16 – 2014-08-23 (×14): 200 mg via ORAL
  Filled 2014-08-16 (×16): qty 1

## 2014-08-16 MED ORDER — ALUM & MAG HYDROXIDE-SIMETH 200-200-20 MG/5ML PO SUSP: 30.0000 mL | Freq: Four times a day (QID) | ORAL | Status: DC | PRN

## 2014-08-16 NOTE — Progress Notes (Signed)
Sharrell Ku PA  paged informed of patient arrival to [facility].
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
## 2014-08-17 DIAGNOSIS — N184 Chronic kidney disease, stage 4 (severe): Secondary | ICD-10-CM | POA: Diagnosis not present

## 2014-08-17 DIAGNOSIS — I129 Hypertensive chronic kidney disease with stage 1 through stage 4 chronic kidney disease, or unspecified chronic kidney disease: Secondary | ICD-10-CM | POA: Diagnosis present

## 2014-08-17 DIAGNOSIS — Z882 Allergy status to sulfonamides status: Secondary | ICD-10-CM | POA: Diagnosis not present

## 2014-08-17 DIAGNOSIS — R0602 Shortness of breath: Secondary | ICD-10-CM | POA: Diagnosis present

## 2014-08-17 DIAGNOSIS — G4733 Obstructive sleep apnea (adult) (pediatric): Secondary | ICD-10-CM | POA: Diagnosis present

## 2014-08-17 DIAGNOSIS — L89892 Pressure ulcer of other site, stage 2: Secondary | ICD-10-CM | POA: Diagnosis present

## 2014-08-17 DIAGNOSIS — E1142 Type 2 diabetes mellitus with diabetic polyneuropathy: Secondary | ICD-10-CM | POA: Diagnosis present

## 2014-08-17 DIAGNOSIS — Z7982 Long term (current) use of aspirin: Secondary | ICD-10-CM | POA: Diagnosis not present

## 2014-08-17 DIAGNOSIS — Z6841 Body Mass Index (BMI) 40.0 and over, adult: Secondary | ICD-10-CM | POA: Diagnosis not present

## 2014-08-17 DIAGNOSIS — I5033 Acute on chronic diastolic (congestive) heart failure: Secondary | ICD-10-CM | POA: Insufficient documentation

## 2014-08-17 DIAGNOSIS — M1990 Unspecified osteoarthritis, unspecified site: Secondary | ICD-10-CM | POA: Diagnosis present

## 2014-08-17 DIAGNOSIS — E782 Mixed hyperlipidemia: Secondary | ICD-10-CM | POA: Diagnosis present

## 2014-08-17 DIAGNOSIS — Z888 Allergy status to other drugs, medicaments and biological substances status: Secondary | ICD-10-CM | POA: Diagnosis not present

## 2014-08-17 DIAGNOSIS — Z7901 Long term (current) use of anticoagulants: Secondary | ICD-10-CM | POA: Diagnosis not present

## 2014-08-17 DIAGNOSIS — I2510 Atherosclerotic heart disease of native coronary artery without angina pectoris: Secondary | ICD-10-CM | POA: Diagnosis present

## 2014-08-17 DIAGNOSIS — I471 Supraventricular tachycardia: Secondary | ICD-10-CM | POA: Diagnosis present

## 2014-08-17 DIAGNOSIS — I872 Venous insufficiency (chronic) (peripheral): Secondary | ICD-10-CM | POA: Diagnosis present

## 2014-08-17 DIAGNOSIS — D61818 Other pancytopenia: Secondary | ICD-10-CM | POA: Diagnosis not present

## 2014-08-17 DIAGNOSIS — Z880 Allergy status to penicillin: Secondary | ICD-10-CM | POA: Diagnosis not present

## 2014-08-17 DIAGNOSIS — I509 Heart failure, unspecified: Secondary | ICD-10-CM | POA: Diagnosis not present

## 2014-08-17 DIAGNOSIS — Z794 Long term (current) use of insulin: Secondary | ICD-10-CM | POA: Diagnosis not present

## 2014-08-17 LAB — GLUCOSE, CAPILLARY
GLUCOSE-CAPILLARY: 136 mg/dL — AB (ref 70–99)
GLUCOSE-CAPILLARY: 146 mg/dL — AB (ref 70–99)
GLUCOSE-CAPILLARY: 133 mg/dL — AB (ref 70–99)
GLUCOSE-CAPILLARY: 164 mg/dL — AB (ref 70–99)

## 2014-08-17 LAB — CBC
HEMATOCRIT: 27.7 % — AB (ref 39.0–52.0)
Hemoglobin: 8.8 g/dL — ABNORMAL LOW (ref 13.0–17.0)
MCH: 29.2 pg (ref 26.0–34.0)
MCHC: 31.8 g/dL (ref 30.0–36.0)
MCV: 92 fL (ref 78.0–100.0)
PLATELETS: 81 10*3/uL — AB (ref 150–400)
RBC: 3.01 MIL/uL — AB (ref 4.22–5.81)
RDW: 16.4 % — ABNORMAL HIGH (ref 11.5–15.5)
WBC: 3.2 10*3/uL — AB (ref 4.0–10.5)

## 2014-08-17 LAB — URINALYSIS, ROUTINE W REFLEX MICROSCOPIC
BILIRUBIN URINE: NEGATIVE
GLUCOSE, UA: NEGATIVE mg/dL
Hgb urine dipstick: NEGATIVE
Ketones, ur: NEGATIVE mg/dL
Leukocytes, UA: NEGATIVE
Nitrite: NEGATIVE
Protein, ur: NEGATIVE mg/dL
SPECIFIC GRAVITY, URINE: 1.007 (ref 1.005–1.030)
Urobilinogen, UA: 0.2 mg/dL (ref 0.0–1.0)
pH: 6.5 (ref 5.0–8.0)

## 2014-08-17 LAB — MAGNESIUM: Magnesium: 2.3 mg/dL (ref 1.5–2.5)

## 2014-08-17 LAB — FERRITIN: Ferritin: 26 ng/mL (ref 22–322)

## 2014-08-17 LAB — IRON AND TIBC
IRON: 45 ug/dL (ref 42–165)
SATURATION RATIOS: 12 % — AB (ref 20–55)
TIBC: 367 ug/dL (ref 215–435)
UIBC: 322 ug/dL (ref 125–400)

## 2014-08-17 LAB — RETICULOCYTES
RBC.: 3.11 MIL/uL — AB (ref 4.22–5.81)
RETIC CT PCT: 2.2 % (ref 0.4–3.1)
Retic Count, Absolute: 68.4 10*3/uL (ref 19.0–186.0)

## 2014-08-17 LAB — BASIC METABOLIC PANEL
ANION GAP: 12 (ref 5–15)
BUN: 42 mg/dL — ABNORMAL HIGH (ref 6–23)
CO2: 24 mmol/L (ref 19–32)
Calcium: 9 mg/dL (ref 8.4–10.5)
Chloride: 104 mmol/L (ref 96–112)
Creatinine, Ser: 2.53 mg/dL — ABNORMAL HIGH (ref 0.50–1.35)
GFR calc Af Amer: 26 mL/min — ABNORMAL LOW (ref >90)
GFR calc non Af Amer: 22 mL/min — ABNORMAL LOW (ref >90)
Glucose, Bld: 114 mg/dL — ABNORMAL HIGH (ref 70–99)
Potassium: 4 mmol/L (ref 3.5–5.1)
SODIUM: 140 mmol/L (ref 135–145)

## 2014-08-17 LAB — FOLATE

## 2014-08-17 LAB — VITAMIN B12: Vitamin B-12: 1315 pg/mL — ABNORMAL HIGH (ref 211–911)

## 2014-08-17 LAB — TSH: TSH: 1.4 u[IU]/mL (ref 0.350–4.500)

## 2014-08-17 NOTE — Progress Notes (Signed)
UR completed 

## 2014-08-17 NOTE — Progress Notes (Signed)
Advanced Heart Failure Rounding Note   Subjective:   [name] is a 81 y.o.male history of chronic diastolic heart failure, PSVT, non-obstructive CAD, HL, DM2, OSA admitted with severe weight gain, edema, and SOB.  Admitted with volume overload likely due to high salt diet. Started on lasix drip. Weight down 4 pounds.  SOB with exertion. Ok at rest.    Creatinine 2.53 Hgb 8.8  Objective:   Weight Range:  Vital Signs:   Temp:  [97.6 F (36.4 C)-98.2 F (36.8 C)] 97.7 F (36.5 C) (04/21 0528) Pulse Rate:  [61-72] 68 (04/21 0528) Resp:  [20-24] 24 (04/21 0528) BP: (107-143)/(47-60) 127/47 mmHg (04/21 0528) SpO2:  [96 %-98 %] 96 % (04/21 0528) Weight:  [362 lb 6.4 oz (164.384 kg)-368 lb 14.4 oz (167.332 kg)] 362 lb 6.4 oz (164.384 kg) (04/21 0528)    Weight change: Filed Weights   08/16/14 1734 08/17/14 0528  Weight: 368 lb 14.4 oz (167.332 kg) 362 lb 6.4 oz (164.384 kg)    Intake/Output:   Intake/Output Summary (Last 24 hours) at 08/17/14 1002 Last data filed at 08/17/14 9326  Gross per 24 hour  Intake 722.33 ml  Output   3675 ml  Net -2952.67 ml     Physical Exam: General:  Lying in bed No resp difficulty HEENT: normal Neck: supple. JVP to ear  . Carotids 2+ bilat; no bruits. No lymphadenopathy or thryomegaly appreciated. Cor: PMI nondisplaced. Regular rate & rhythm. No rubs, gallops or murmurs. Lungs: clear Abdomen: obese, soft, nontender, nondistended. No hepatosplenomegaly. No bruits or masses. Good bowel sounds. Extremities: no cyanosis, clubbing, rash, R andLLE  2-3+edema Neuro: alert & orientedx3, cranial nerves grossly intact. moves all 4 extremities w/o difficulty. Affect pleasant  Telemetry: NSR 60s   Labs: Basic Metabolic Panel:  Recent Labs Lab 08/16/14 1945 08/17/14 0532  NA 140 140  K 3.8 4.0  CL 104 104  CO2 25 24  GLUCOSE 115* 114*  BUN 41* 42*  CREATININE 2.51* 2.53*  CALCIUM 9.0 9.0  MG 2.1 2.3    Liver Function Tests: No  results for input(s): AST, ALT, ALKPHOS, BILITOT, PROT, ALBUMIN in the last 168 hours. No results for input(s): LIPASE, AMYLASE in the last 168 hours. No results for input(s): AMMONIA in the last 168 hours.  CBC:  Recent Labs Lab 08/16/14 1945 08/17/14 0532  WBC 3.3* 3.2*  HGB 8.8* 8.8*  HCT 27.1* 27.7*  MCV 90.6 92.0  PLT 81* 81*    Cardiac Enzymes: No results for input(s): CKTOTAL, CKMB, CKMBINDEX, TROPONINI in the last 168 hours.  BNP: BNP (last 3 results)  Recent Labs  08/16/14 1945  BNP 869.7*    ProBNP (last 3 results) No results for input(s): PROBNP in the last 8760 hours.    Other results:  Imaging: Dg Chest Port 1 View  08/16/2014   CLINICAL DATA:  81 year old male with a history of shortness of breath  EXAM: PORTABLE CHEST - 1 VIEW  COMPARISON:  04/14/2013  FINDINGS: Cardiomediastinal silhouette unchanged in size and contour with persisting cardiomegaly. Fullness in the central vasculature.  Atherosclerotic calcifications of the aortic arch.  Interstitial opacity with mixed ground-glass opacities, prominent at the bases. Interlobular septal thickening.  Blunting of the bilateral costophrenic sulci. Retrocardiac region not well evaluated.  No pneumothorax identified.  No displaced fracture identified.  IMPRESSION: Evidence of congestive heart failure.  Small pleural effusions not excluded.  Atherosclerosis.  Signed,  Dulcy Fanny. Earleen Newport DO  [facility]  [facility]   Electronically Signed   By: Corrie Mckusick D.O.   On: 08/16/2014 21:26      Medications:     Scheduled Medications: . acidophilus  1 capsule Oral Daily  . amLODipine  10 mg Oral Daily  . aspirin  162 mg Oral Daily  . calcium-vitamin D  1 tablet Oral BID WC  . cholecalciferol  5,000 Units Oral Daily  . fluticasone  2 spray Each Nare Daily  . heparin  5,000 Units Subcutaneous 3 times per day  . insulin aspart  0-9 Units Subcutaneous TID WC  . insulin  detemir  28 Units Subcutaneous QHS  . labetalol  200 mg Oral BID  . latanoprost  1 drop Right Eye QHS  . loratadine  10 mg Oral QPM  . multivitamin with minerals  1 tablet Oral Daily  . potassium chloride SA  20 mEq Oral BID  . sodium chloride  3 mL Intravenous Q12H  . tamsulosin  0.4 mg Oral QHS  . vitamin B-12  1,000 mcg Oral Daily     Infusions: . furosemide (LASIX) infusion 10 mg/hr (08/16/14 2241)     PRN Medications:  sodium chloride, acetaminophen, HYDROcodone-acetaminophen, ondansetron (ZOFRAN) IV, sodium chloride, sodium chloride   Assessment:  1. A/C Diastolic Heart Failure 2. OSA 3. DM2 4. Morbid Obesity  5. Anemia  6. CKD 7. Immobility   Plan/Discussion:   Marked volume overload noted. 20-30 pounds up from baseline. Renal function elevated. Continue lasix drip at 10 mg per hour. Follow renal function closely. Refer to dietitian.    Check anemia panel. Hgb down. No BRBPR.    Consult PT/OT   Length of Stay: 1   CLEGG,AMY NP-C  08/17/2014, 10:02 AM  Advanced Heart Failure Team Pager [phone] (M-F; 7a - 4p)  Please contact [facility] for night-coverage after hours (4p -7a ) and weekends on [email or web address]  Patient seen and examined with Darrick Grinder, NP. We discussed all aspects of the encounter. I agree with the assessment and plan as stated above.   Responding well to IV lasix. Will continue. We have a way to go. He has chronic pancytopenia - will need outpatient heme eval.  Coree Riester,MD 1:35 PM

## 2014-08-17 NOTE — Evaluation (Addendum)
Physical Therapy Evaluation Patient Details Name: [name] MRN: [id number] DOB: 1931/09/05 Today's Date: 08/17/2014   History of Present Illness  [name] is a 81 y.o.male history of chronic diastolic heart failure, PSVT, non-obstructive CAD, HL, DM2, OSA , left eye blindness admitted with severe weight gain, edema, and SOB due to dietary noncompliance  Clinical Impression  Pt very pleasant and wanting to mobilize. Pt states he and wife have managed at home with assist of son but that he has had increasing difficulty with mobility due to pain and edema of LLE. Pt with decreased activity tolerance, mobility and gait who will benefit from acute therapy to maximize tranfers,  Function and gait to decrease burden of care.     Follow Up Recommendations SNF;Supervision for mobility/OOB (pt states he will refuse any rehab centers and adament for home. if denies SNF recommend HHPT)    Equipment Recommendations  None recommended by PT;Other (comment) (scooter)    Recommendations for Other Services       Precautions / Restrictions Precautions Precautions: Fall Precaution Comments: 362#      Mobility  Bed Mobility Overal bed mobility: Needs Assistance Bed Mobility: Supine to Sit     Supine to sit: Min assist;HOB elevated     General bed mobility comments: with HOB 20 degrees, rail and assist to move LLE to EOB pt able to transfer supine to sit with min assist and cues  Transfers Overall transfer level: Needs assistance   Transfers: Sit to/from Stand;Stand Pivot Transfers Sit to Stand: Min guard Stand pivot transfers: Min guard       General transfer comment: cues for hand placement and safety. RW for pivot bed to chair . pt denied attempting ambulation at this time. dyspnea 2/4 with just transfer to chair  Ambulation/Gait                Stairs            Wheelchair Mobility    Modified Rankin (Stroke Patients Only)       Balance Overall balance  assessment: Needs assistance   Sitting balance-Leahy Scale: Good       Standing balance-Leahy Scale: Poor                               Pertinent Vitals/Pain Pain Assessment: 0-10 Pain Score: 3  Pain Location: left hip and leg Pain Descriptors / Indicators: Aching Pain Intervention(s): Repositioned    Home Living Family/patient expects to be discharged to:: Private residence Living Arrangements: Spouse/significant other Available Help at Discharge: Family Type of Home: House Home Access: Ramped entrance     Home Layout: One level Home Equipment: Environmental consultant - 2 wheels;Wheelchair - [employer];Tub bench Additional Comments: pt continues to ask for a motorized scooter as he was asking for with last admission a year ago    Prior Function Level of Independence: Independent with assistive device(s);Needs assistance   Gait / Transfers Assistance Needed: pt states he only walks grossly 70' with Rw at home and otherwise uses a WC  ADL's / Homemaking Assistance Needed: does not do homemaking, recently has needed assist for lower body bathing and dressing but reports baseline he performs for himself        Hand Dominance        Extremity/Trunk Assessment   Upper Extremity Assessment: Generalized weakness           Lower Extremity Assessment: Generalized weakness (increased  edema LLE>RLE)      Cervical / Trunk Assessment: Kyphotic  Communication   Communication: No difficulties  Cognition Arousal/Alertness: Awake/alert Behavior During Therapy: WFL for tasks assessed/performed Overall Cognitive Status: Within Functional Limits for tasks assessed                      General Comments      Exercises General Exercises - Lower Extremity Short Arc Quad: AROM;Seated;Left;10 reps Long Arc Quad: AROM;Seated;Right;10 reps Hip Flexion/Marching: AROM;Seated;Both;10 reps      Assessment/Plan    PT Assessment Patient needs continued PT  services  PT Diagnosis Difficulty walking;Generalized weakness   PT Problem List Decreased strength;Decreased activity tolerance;Decreased balance;Decreased mobility;Decreased knowledge of use of DME;Decreased knowledge of precautions;Decreased skin integrity;Obesity  PT Treatment Interventions Gait training;Functional mobility training;Therapeutic activities;Therapeutic exercise;Balance training;DME instruction;Patient/family education   PT Goals (Current goals can be found in the Care Plan section) Acute Rehab PT Goals Patient Stated Goal: return home PT Goal Formulation: With patient Time For Goal Achievement: 08/31/14 Potential to Achieve Goals: Fair    Frequency Min 3X/week   Barriers to discharge Decreased caregiver support wife cannot physically assist pt and son not able to be present throughout the day    Co-evaluation               End of Session   Activity Tolerance: Patient limited by fatigue Patient left: in chair;with call bell/phone within reach;with nursing/sitter in room Nurse Communication: Mobility status;Precautions    Functional Assessment Tool Used: clinical judgement Functional Limitation: Mobility: Walking and moving around Mobility: Walking and Moving Around Current Status (V3710): At least 20 percent but less than 40 percent impaired, limited or restricted Mobility: Walking and Moving Around Goal Status [phone]): At least 1 percent but less than 20 percent impaired, limited or restricted    Time: 8546-2703 PT Time Calculation (min) (ACUTE ONLY): 25 min   Charges:   PT Evaluation $Initial PT Evaluation Tier I: 1 Procedure PT Treatments $Therapeutic Activity: 8-22 mins   PT G Codes:   PT G-Codes **NOT FOR INPATIENT CLASS** Functional Assessment Tool Used: clinical judgement Functional Limitation: Mobility: Walking and moving around Mobility: Walking and Moving Around Current Status (J0093): At least 20 percent but less than 40 percent impaired,  limited or restricted Mobility: Walking and Moving Around Goal Status [phone]): At least 1 percent but less than 20 percent impaired, limited or restricted    Melford Aase 08/17/2014, 12:30 PM Elwyn Reach, [place];

## 2014-08-18 DIAGNOSIS — I509 Heart failure, unspecified: Secondary | ICD-10-CM

## 2014-08-18 LAB — BASIC METABOLIC PANEL
Anion gap: 10 (ref 5–15)
BUN: 43 mg/dL — AB (ref 6–23)
CO2: 30 mmol/L (ref 19–32)
CREATININE: 2.62 mg/dL — AB (ref 0.50–1.35)
Calcium: 9.2 mg/dL (ref 8.4–10.5)
Chloride: 100 mmol/L (ref 96–112)
GFR, EST AFRICAN AMERICAN: 25 mL/min — AB (ref >90)
GFR, EST NON AFRICAN AMERICAN: 21 mL/min — AB (ref >90)
Glucose, Bld: 120 mg/dL — ABNORMAL HIGH (ref 70–99)
Potassium: 3.8 mmol/L (ref 3.5–5.1)
Sodium: 140 mmol/L (ref 135–145)

## 2014-08-18 LAB — GLUCOSE, CAPILLARY
GLUCOSE-CAPILLARY: 119 mg/dL — AB (ref 70–99)
GLUCOSE-CAPILLARY: 133 mg/dL — AB (ref 70–99)
Glucose-Capillary: 152 mg/dL — ABNORMAL HIGH (ref 70–99)
Glucose-Capillary: 160 mg/dL — ABNORMAL HIGH (ref 70–99)

## 2014-08-18 MED ORDER — PERFLUTREN LIPID MICROSPHERE
1.0000 mL | INTRAVENOUS | Status: AC | PRN
  Administered 2014-08-18: 4 mL via INTRAVENOUS
  Filled 2014-08-18: qty 10

## 2014-08-18 NOTE — Progress Notes (Signed)
  Echocardiogram Echocardiogram Transesophageal has been performed.  Darlina Sicilian M 08/18/2014, 3:25 PM

## 2014-08-18 NOTE — Progress Notes (Signed)
Pt. Has his home cpap and states he can operate and place on himself. RT informed pt. To notify if he needed any assistance. 

## 2014-08-18 NOTE — Progress Notes (Signed)
Pt continues to refuse foley catheter placement.

## 2014-08-18 NOTE — Care Management Note (Addendum)
    Page 1 of 2   08/23/2014     1:44:32 PM CARE MANAGEMENT NOTE 08/23/2014  Patient:  [name], [name]   Account Number:  [id number]  Date Initiated:  08/18/2014  Documentation initiated by:  Bellamy Judson  Subjective/Objective Assessment:   Pt adm on 08/16/14 with CHF.  PTA, pt resides at home with spouse.     Action/Plan:   PT recommending SNF at dc, but pt refuses.  Would recommend [facility] for CHF follow up and HHPT at dc.  Will follow for orders.   Anticipated DC Date:  08/23/2014   Anticipated DC Plan:  [place]  CM consult  [facility]      [facility] Site Choice  HOME HEALTH   Choice offered to / List presented to:  C-1 Patient        [place] arranged  HH-1 RN  [place]      [place]   Status of service:  Completed, signed off Medicare Important Message given?  YES (If response is "NO", the following Medicare IM given date fields will be blank) Date Medicare IM given:  08/21/2014 Medicare IM given by:  [facility] Date Additional Medicare IM given:   Additional Medicare IM given by:    Discharge Disposition:  [place]  Per UR Regulation:  Reviewed for med. necessity/level of care/duration of stay  If discussed at [employer] of Stay Meetings, dates discussed:   08/22/2014    Comments:  08/23/14 Ellan Lambert, RN, BSN [phone] Pt denied for CIR; he refuses SNF placement.  Pt for dc home today; plan max HH care with [facility], per pt choice. Referral to [facility].  Start of care 24-48h post dc date.  Pt in progress of getting a [place] W/C for home with help of PCP, per his report.  08/21/2014 1600 NCM spoke to pt and offered choice for [facility]. Provided pt with HHA list. Pt requesting [facility] for HH. States he really would like IP rehab. Waiting final recommendations at dc. Pt states he has RW, wheelchair, tub bench and ramp at home.  Plans dc to home with [facility] if he cannot go to IP rehab. Will Balfour orders. Contacted [facility] with referral. Jonnie Finner RN CCM Case Mgmt phone [phone]

## 2014-08-18 NOTE — Consult Note (Addendum)
WOC wound consult note Reason for Consult: Consult requested for left inner groin wound.  Pt states this is chronic and he is followed by a dermatologist prior to admission. They have not been able to determine the etiology and have tried several different topical treatments before ordering Clearasil as the topical treatment for this site. Measurement: 3X3X.1cm Wound bed: Red and moist Drainage (amount, consistency, odor) No odor or drainage Periwound: Intact skin surrounding Dressing procedure/placement/frequency: Continue present plan of care with Clearasil; pt has his own tube at the bedside and he can resume follow-up with dermatology after discharge. Discussed with pt and he denies further questions. Please re-consult if further assistance is needed.  Thank-you,  Julien Girt MSN, [place], [place], [place], [place]

## 2014-08-18 NOTE — Progress Notes (Signed)
Patient has his home CPAP in his room that he places on when ready for bed.  Patient aware to have RN call RT if he needs further assistance.

## 2014-08-18 NOTE — Progress Notes (Signed)
Advanced Heart Failure Rounding Note   Subjective:   [name] is a 81 y.o.male history of chronic diastolic heart failure, PSVT, non-obstructive CAD, HL, DM2, OSA admitted with severe weight gain, edema, and SOB.  Admitted with volume overload likely due to high salt diet. Started on lasix drip. Continues to diurese. Weight down another 6 pounds.   SOB with exertion. Ok at rest.    Creatinine 2.53>2.6 Iron 45. .    Objective:   Weight Range:  Vital Signs:   Temp:  [97.6 F (36.4 C)-99.5 F (37.5 C)] 98 F (36.7 C) (04/22 0636) Pulse Rate:  [61-70] 62 (04/22 0636) Resp:  [18-20] 18 (04/22 0636) BP: (122-160)/(50-90) 133/85 mmHg (04/22 0636) SpO2:  [91 %-100 %] 96 % (04/22 0636) Weight:  [356 lb 3.2 oz (161.571 kg)] 356 lb 3.2 oz (161.571 kg) (04/22 0636) Last BM Date: 08/17/14  Weight change: Filed Weights   08/16/14 1734 08/17/14 0528 08/18/14 0636  Weight: 368 lb 14.4 oz (167.332 kg) 362 lb 6.4 oz (164.384 kg) 356 lb 3.2 oz (161.571 kg)    Intake/Output:   Intake/Output Summary (Last 24 hours) at 08/18/14 0901 Last data filed at 08/18/14 0701  Gross per 24 hour  Intake 1259.83 ml  Output   3800 ml  Net -2540.17 ml     Physical Exam: General:  Lying in bed No resp difficulty HEENT: normal Neck: supple. JVP 9-10  . Carotids 2+ bilat; no bruits. No lymphadenopathy or thryomegaly appreciated. Cor: PMI nondisplaced. Regular rate & rhythm. No rubs, gallops or murmurs. Lungs: clear Abdomen: obese, soft, nontender, nondistended. No hepatosplenomegaly. No bruits or masses. Good bowel sounds. Extremities: no cyanosis, clubbing, rash, R andLLE  1+edema Neuro: alert & orientedx3, cranial nerves grossly intact. moves all 4 extremities w/o difficulty. Affect pleasant  Telemetry: NSR 60s   Labs: Basic Metabolic Panel:  Recent Labs Lab 08/16/14 1945 08/17/14 0532 08/18/14 0425  NA 140 140 140  K 3.8 4.0 3.8  CL 104 104 100  CO2 25 24 30   GLUCOSE 115* 114* 120*   BUN 41* 42* 43*  CREATININE 2.51* 2.53* 2.62*  CALCIUM 9.0 9.0 9.2  MG 2.1 2.3  --     Liver Function Tests: No results for input(s): AST, ALT, ALKPHOS, BILITOT, PROT, ALBUMIN in the last 168 hours. No results for input(s): LIPASE, AMYLASE in the last 168 hours. No results for input(s): AMMONIA in the last 168 hours.  CBC:  Recent Labs Lab 08/16/14 1945 08/17/14 0532  WBC 3.3* 3.2*  HGB 8.8* 8.8*  HCT 27.1* 27.7*  MCV 90.6 92.0  PLT 81* 81*    Cardiac Enzymes: No results for input(s): CKTOTAL, CKMB, CKMBINDEX, TROPONINI in the last 168 hours.  BNP: BNP (last 3 results)  Recent Labs  08/16/14 1945  BNP 869.7*    ProBNP (last 3 results) No results for input(s): PROBNP in the last 8760 hours.    Other results:  Imaging: Dg Chest Port 1 View  08/16/2014   CLINICAL DATA:  81 year old male with a history of shortness of breath  EXAM: PORTABLE CHEST - 1 VIEW  COMPARISON:  04/14/2013  FINDINGS: Cardiomediastinal silhouette unchanged in size and contour with persisting cardiomegaly. Fullness in the central vasculature.  Atherosclerotic calcifications of the aortic arch.  Interstitial opacity with mixed ground-glass opacities, prominent at the bases. Interlobular septal thickening.  Blunting of the bilateral costophrenic sulci. Retrocardiac region not well evaluated.  No pneumothorax identified.  No displaced fracture identified.  IMPRESSION: Evidence of  congestive heart failure.  Small pleural effusions not excluded.  Atherosclerosis.  Signed,  Dulcy Fanny. Earleen Newport, DO  [facility]  [facility]   Electronically Signed   By: Corrie Mckusick D.O.   On: 08/16/2014 21:26     Medications:     Scheduled Medications: . acidophilus  1 capsule Oral Daily  . amLODipine  10 mg Oral Daily  . aspirin  162 mg Oral Daily  . calcium-vitamin D  1 tablet Oral BID WC  . cholecalciferol  5,000 Units Oral Daily  . fluticasone  2 spray Each Nare  Daily  . heparin  5,000 Units Subcutaneous 3 times per day  . insulin aspart  0-9 Units Subcutaneous TID WC  . insulin detemir  28 Units Subcutaneous QHS  . labetalol  200 mg Oral BID  . latanoprost  1 drop Right Eye QHS  . loratadine  10 mg Oral QPM  . multivitamin with minerals  1 tablet Oral Daily  . potassium chloride SA  20 mEq Oral BID  . sodium chloride  3 mL Intravenous Q12H  . tamsulosin  0.4 mg Oral QHS  . vitamin B-12  1,000 mcg Oral Daily    Infusions: . furosemide (LASIX) infusion 10 mg/hr (08/18/14 0027)    PRN Medications: sodium chloride, acetaminophen, HYDROcodone-acetaminophen, ondansetron (ZOFRAN) IV, sodium chloride, sodium chloride   Assessment:  1. A/C Diastolic Heart Failure 2. OSA 3. DM2 4. Morbid Obesity  5. Anemia  6. CKD 7. Immobility   Plan/Discussion:   Marked volume overload noted. Weight coming off.  Renal function up a little. Continue lasix drip at 10 mg per hour. Follow renal function closely.   Check anemia panel. Hgb down. Check CBC. No BRBPR.    Requesting wound consult for R groin.   Consult PT/OT   Length of Stay: 2   CLEGG,AMY NP-C  08/18/2014, 9:01 AM  Advanced Heart Failure Team Pager [phone] (M-F; [place])  Please contact [facility] for night-coverage after hours (4p -7a ) and weekends on [email or web address]  Patient seen and examined with Darrick Grinder, NP. We discussed all aspects of the encounter. I agree with the assessment and plan as stated above.   Improving with diuresis. Will continue. Can wrap legs as needed. Agree with wound consult. Baseline weight hard to know but seems to be about 330-340. Was as low 310 last year.   Bob Daversa,MD 10:25 AM

## 2014-08-19 DIAGNOSIS — G4730 Sleep apnea, unspecified: Secondary | ICD-10-CM | POA: Diagnosis present

## 2014-08-19 LAB — BASIC METABOLIC PANEL
Anion gap: 12 (ref 5–15)
BUN: 44 mg/dL — ABNORMAL HIGH (ref 6–23)
CHLORIDE: 98 mmol/L (ref 96–112)
CO2: 30 mmol/L (ref 19–32)
CREATININE: 2.58 mg/dL — AB (ref 0.50–1.35)
Calcium: 9.3 mg/dL (ref 8.4–10.5)
GFR calc non Af Amer: 22 mL/min — ABNORMAL LOW (ref >90)
GFR, EST AFRICAN AMERICAN: 25 mL/min — AB (ref >90)
Glucose, Bld: 120 mg/dL — ABNORMAL HIGH (ref 70–99)
POTASSIUM: 3.5 mmol/L (ref 3.5–5.1)
SODIUM: 140 mmol/L (ref 135–145)

## 2014-08-19 LAB — GLUCOSE, CAPILLARY
GLUCOSE-CAPILLARY: 113 mg/dL — AB (ref 70–99)
GLUCOSE-CAPILLARY: 157 mg/dL — AB (ref 70–99)
Glucose-Capillary: 169 mg/dL — ABNORMAL HIGH (ref 70–99)
Glucose-Capillary: 127 mg/dL — ABNORMAL HIGH (ref 70–99)

## 2014-08-19 MED ORDER — CALCIUM CARBONATE-VITAMIN D 500-200 MG-UNIT PO TABS
1.0000 | ORAL_TABLET | Freq: Two times a day (BID) | ORAL | Status: DC
  Administered 2014-08-20 – 2014-08-23 (×7): 1 via ORAL
  Filled 2014-08-19 (×9): qty 1

## 2014-08-19 MED ORDER — POTASSIUM CHLORIDE CRYS ER 20 MEQ PO TBCR
40.0000 meq | EXTENDED_RELEASE_TABLET | Freq: Once | ORAL | Status: AC
  Administered 2014-08-19: 40 meq via ORAL
  Filled 2014-08-19: qty 2

## 2014-08-19 NOTE — Progress Notes (Addendum)
Subjective:  SOB some better- he agrred to a foley  Objective:  Vital Signs in the last 24 hours: Temp:  [98 F (36.7 C)-98.3 F (36.8 C)] 98.3 F (36.8 C) (04/23 0537) Pulse Rate:  [60-68] 68 (04/23 0537) Resp:  [18-20] 20 (04/23 0537) BP: (113-136)/(44-79) 126/55 mmHg (04/23 0537) SpO2:  [94 %-98 %] 98 % (04/23 0537) Weight:  [355 lb 9 oz (161.281 kg)] 355 lb 9 oz (161.281 kg) (04/23 0537)  Intake/Output from previous day:  Intake/Output Summary (Last 24 hours) at 08/19/14 0911 Last data filed at 08/19/14 0843  Gross per 24 hour  Intake 2217.33 ml  Output   1625 ml  Net 592.33 ml    Physical Exam: General appearance: alert, cooperative, no distress and morbidly obese Lungs: decreased but clear Heart: regularly irregular rhythm Extremities: chronic venous skin changes, 1+ edema Skin: cool, dry Neurologic: Grossly normal   Rate: 68  Rhythm: normal sinus rhythm, premature atrial contractions (PAC) and premature ventricular contractions (PVC)  Lab Results:  Recent Labs  08/16/14 1945 08/17/14 0532  WBC 3.3* 3.2*  HGB 8.8* 8.8*  PLT 81* 81*    Recent Labs  08/18/14 0425 08/19/14 0556  NA 140 140  K 3.8 3.5  CL 100 98  CO2 30 30  GLUCOSE 120* 120*  BUN 43* 44*  CREATININE 2.62* 2.58*   No results for input(s): TROPONINI in the last 72 hours.  Invalid input(s): CK, MB  Recent Labs  08/16/14 1945  INR 1.21     Imaging: Imaging results have been reviewed  Cardiac Studies: Echo 08/18/14  Study Conclusions  - Left ventricle: The cavity size was normal. Wall thickness was increased in a pattern of severe LVH. Systolic function was mildly reduced. The estimated ejection fraction was in the range of 45% to 50%. Diffuse hypokinesis. Features are consistent with a pseudonormal left ventricular filling pattern, with concomitant abnormal relaxation and increased filling pressure (grade 2 diastolic dysfunction). Doppler parameters are  consistent with high ventricular filling pressure. - Aortic valve: Valve area (VTI): 2.38 cm^2. Valve area (Vmax): 2.16 cm^2. Valve area (Vmean): 2.19 cm^2. - Mitral valve: Calcified annulus. Valve area by continuity equation (using LVOT flow): 2.49 cm^2. - Right atrium: The atrium was mildly dilated. - Pulmonary arteries: PA peak pressure: 37 mm Hg (S).   Assessment/Plan:  81 y.o.male history of chronic diastolic heart failure, PSVT, non-obstructive CAD 2006, stage 3-4 CRI, HL, DM2, morbid obesity (BMI 51), and OSA - (on C-pap) admitted 08/16/14 with severe weight gain, edema, and SOB. Adm wgt-368 lbs.   Principal Problem:   Acute on chronic diastolic heart failure Active Problems:   Morbid obesity-BMI 51   Essential hypertension, benign   CKD (chronic kidney disease) stage 3, GFR 30-59 ml/min   Mixed hyperlipidemia   CAD- mild non obstructive Oct 2006   SVT/ PSVT/ PAT   Sleep apnea- on C-pap   PLAN: Wgt down 13 lbs since admission. He has a foley in place so I/O should be more accurate. Continue Lasix drip. Not clear what his "dry wgt" is- the pt does not know. Dr McDowell's office note from Jan shows a wgt of 337.   Kerin Ransom PA-C [facility] [phone] 08/19/2014, 9:11 AM  Patient seen and examined with Kerin Ransom, PA-C. We discussed all aspects of the encounter. I agree with the assessment and plan as stated above. He continues to diurese. Getting close to baseline. Has agreed to Foley. Renal function stable. Will supp K+.  He is interested in going to CIR again. Will ask PT if he qualifies.   Diva Lemberger,MD 12:30 PM

## 2014-08-20 DIAGNOSIS — L8992 Pressure ulcer of unspecified site, stage 2: Secondary | ICD-10-CM

## 2014-08-20 LAB — GLUCOSE, CAPILLARY
GLUCOSE-CAPILLARY: 114 mg/dL — AB (ref 70–99)
Glucose-Capillary: 159 mg/dL — ABNORMAL HIGH (ref 70–99)
Glucose-Capillary: 134 mg/dL — ABNORMAL HIGH (ref 70–99)
Glucose-Capillary: 168 mg/dL — ABNORMAL HIGH (ref 70–99)

## 2014-08-20 MED ORDER — BARRIER CREAM NON-SPECIFIED
1.0000 "application " | TOPICAL_CREAM | Freq: Two times a day (BID) | TOPICAL | Status: DC
  Administered 2014-08-21 – 2014-08-23 (×5): 1 via TOPICAL
  Filled 2014-08-20: qty 1

## 2014-08-20 MED ORDER — POTASSIUM CHLORIDE CRYS ER 20 MEQ PO TBCR
40.0000 meq | EXTENDED_RELEASE_TABLET | Freq: Once | ORAL | Status: AC
  Administered 2014-08-20: 40 meq via ORAL
  Filled 2014-08-20: qty 2

## 2014-08-20 NOTE — Clinical Social Work Note (Addendum)
Clinical Social Work Assessment  Patient Details  Name: [name] MRN: [id number] Date of Birth: 09-21-1931  Date of referral:  08/20/14               Reason for consult:  Discharge Planning                Permission sought to share information with:  Other (CSW asked if referrals could be made to SNFs. Patient is not agreeable to this. ) Permission granted to share information::  No  Name::     Information can be shared with spouse and children.  Agency::     Relationship::     Contact Information:     Housing/Transportation Living arrangements for the past 2 months:  Single Family Home Source of Information:  Patient Patient Interpreter Needed:  None Criminal Activity/Legal Involvement Pertinent to Current Situation/Hospitalization:  No - Comment as needed Significant Relationships:  Adult Children, Other Family Members, Spouse Lives with:  Spouse Do you feel safe going back to the place where you live?  Yes Need for family participation in patient care:  Yes (Comment)  Care giving concerns:  Patient does not report any. He states that he has a very supportive family of 2 living children, and 8 grandchildren.    Social Worker assessment / plan:  CSW met with patient at bedside to complete assessment. Patient was very welcoming of CSW and showed lots of engagement. CSW explained reason for visit (SNF recommendation). Patient states that he has plans to return home to be with his wife. He reports that he has a difficult time with adjusting to not being at home and that he wants to spend the time he has left at his home with his wife. Patient does request assistance with getting a lift for his electric scooter. CSW will leave this information on weekend handoff so that weekday RNCM can be made aware. Patient does a lot of story telling and enjoys just talking to people. He shared several stories about his family with CSW. At this time, CSW will sign off as patient has decided to  return home with HHPT when ready. He prefers [facility].  Employment status:  Retired Forensic scientist:  Medicare, Other (Comment Required) (Patient also covered by Loss adjuster, chartered) PT Recommendations:  [place] (SNF recommended but patient refuses placement.) Information / Referral to community resources:  Other (Comment Required) (NONE)  Patient/Family's Response to care:  CSW approached patient about SNF placement at discharge. Patient refuses placement and plans to return home at discharge.   Patient/Family's Understanding of and Emotional Response to Diagnosis, Current Treatment, and Prognosis:  Patient states he doesn't think he has much longer on this earth and wants to live his "last days or years" at home with his wife. He and his wife have been married for 65 years. He plans to make the best of the time he has left.  Emotional Assessment Appearance:  Appears stated age Attitude/Demeanor/Rapport:    Affect (typically observed):  Accepting, Appropriate, Calm, Happy, Hopeful Orientation:  Oriented to Self, Oriented to Place, Oriented to  Time, Oriented to Situation Alcohol / Substance use:  Not Applicable Psych involvement (Current and /or in the community):  Outpatient Provider  Discharge Needs  Concerns to be addressed:  Discharge Planning Concerns, Other (Comment Required (Patient requests assistance with getting electic scooter lift.) Readmission within the last 30 days:  No Current discharge risk:  Other (PT has recommended SNF but patient insists on  going home with HHPT) Barriers to Discharge:  Other (Continuing lasix drip)  Liz Beach MSW, [place], [place], [phone]

## 2014-08-20 NOTE — Progress Notes (Signed)
Patient places himself on CPAP tonight. Will assist if needed, no issues at this time.

## 2014-08-20 NOTE — Progress Notes (Signed)
    Subjective: Feeling better. No dyspnea. Wanting to know about CIR.   Objective: Vital signs in last 24 hours: Temp:  [97.8 F (36.6 C)-98.2 F (36.8 C)] 97.8 F (36.6 C) (04/24 0527) Pulse Rate:  [58-68] 58 (04/24 0527) Resp:  [18-20] 18 (04/24 0527) BP: (113-127)/(50-79) 120/55 mmHg (04/24 0527) SpO2:  [92 %-98 %] 98 % (04/24 0527) Weight:  [348 lb 15.8 oz (158.3 kg)] 348 lb 15.8 oz (158.3 kg) (04/24 0527) Last BM Date: 08/19/14  Intake/Output from previous day: 04/23 0701 - 04/24 0700 In: 2632.2 [P.O.:2284; I.V.:348.2] Out: 3650 [Urine:3650] Intake/Output this shift:    Medications Scheduled Meds: . acidophilus  1 capsule Oral Daily  . amLODipine  10 mg Oral Daily  . aspirin  162 mg Oral Daily  . calcium-vitamin D  1 tablet Oral BID WC  . cholecalciferol  5,000 Units Oral Daily  . fluticasone  2 spray Each Nare Daily  . heparin  5,000 Units Subcutaneous 3 times per day  . insulin aspart  0-9 Units Subcutaneous TID WC  . insulin detemir  28 Units Subcutaneous QHS  . labetalol  200 mg Oral BID  . latanoprost  1 drop Right Eye QHS  . loratadine  10 mg Oral QPM  . multivitamin with minerals  1 tablet Oral Daily  . potassium chloride SA  20 mEq Oral BID  . sodium chloride  3 mL Intravenous Q12H  . tamsulosin  0.4 mg Oral QHS  . vitamin B-12  1,000 mcg Oral Daily   Continuous Infusions: . furosemide (LASIX) infusion 10 mg/hr (08/19/14 2154)   PRN Meds:.sodium chloride, acetaminophen, HYDROcodone-acetaminophen, ondansetron (ZOFRAN) IV, sodium chloride, sodium chloride  PE: General appearance: alert, cooperative and no distress Lungs: bilateral rhonchi Heart: irregularly irregular rhythm and 1/6 sys MM Abdomen: +BS soft, nontender Extremities: 3+ left LEE.  1+ right LEE Pulses: 2+ radials Skin: Warm and dry.   stage 1 pressure ulcer, posterior left thigh near crease of buttocks.  Neurologic: Grossly normal  Lab Results:  No results for input(s): WBC, HGB,  HCT, PLT in the last 72 hours. BMET  Recent Labs  08/18/14 0425 08/19/14 0556  NA 140 140  K 3.8 3.5  CL 100 98  CO2 30 30  GLUCOSE 120* 120*  BUN 43* 44*  CREATININE 2.62* 2.58*  CALCIUM 9.2 9.3    Assessment/Plan   Principal Problem:   Acute on chronic diastolic heart failure Active Problems:   Mixed hyperlipidemia   Morbid obesity-BMI 51   Essential hypertension, benign   CAD- mild non obstructive Oct 2006   SVT/ PSVT/ PAT   CKD (chronic kidney disease) stage 3, GFR 30-59 ml/min   Sleep apnea- on C-pap   Stage 2 pressure ulcer    Net fluids: -1.0L/-6.4L.  Wt: 348.  SCr stable.  Give additional K this morning.  Continue lasix today.  Home in 2-3 days.  Apply barrier cream to ulcer on posterior, proximal left thigh.  Shift position regularly.  He is on an air bed.    LOS: 4 days    Tarri Fuller PA-C 08/20/2014 7:34 AM  Patient seen and examined with Tarri Fuller, PA-C. We discussed all aspects of the encounter. I agree with the assessment and plan as stated above. Continues to diurese. Weight down 20 pounds.  Renal function stable. Will continue current regimen.   Daniel Bensimhon,MD 12:40 PM

## 2014-08-21 LAB — BASIC METABOLIC PANEL
Anion gap: 11 (ref 5–15)
BUN: 47 mg/dL — AB (ref 6–23)
CALCIUM: 9 mg/dL (ref 8.4–10.5)
CHLORIDE: 99 mmol/L (ref 96–112)
CO2: 28 mmol/L (ref 19–32)
Creatinine, Ser: 2.58 mg/dL — ABNORMAL HIGH (ref 0.50–1.35)
GFR calc Af Amer: 25 mL/min — ABNORMAL LOW (ref >90)
GFR calc non Af Amer: 22 mL/min — ABNORMAL LOW (ref >90)
Glucose, Bld: 184 mg/dL — ABNORMAL HIGH (ref 70–99)
Potassium: 3.8 mmol/L (ref 3.5–5.1)
SODIUM: 138 mmol/L (ref 135–145)

## 2014-08-21 LAB — GLUCOSE, CAPILLARY
GLUCOSE-CAPILLARY: 139 mg/dL — AB (ref 70–99)
GLUCOSE-CAPILLARY: 136 mg/dL — AB (ref 70–99)
GLUCOSE-CAPILLARY: 187 mg/dL — AB (ref 70–99)
Glucose-Capillary: 113 mg/dL — ABNORMAL HIGH (ref 70–99)

## 2014-08-21 NOTE — Progress Notes (Addendum)
CARE MANAGEMENT NOTE 08/21/2014  Patient:  JDEN, WANT   Account Number:  [id number]  Date Initiated:  08/18/2014  Documentation initiated by:  AMERSON,JULIE  Subjective/Objective Assessment:   Pt adm on 08/16/14 with CHF.  PTA, pt resides at home with spouse.     Action/Plan:   PT recommending SNF at dc, but pt refuses.  Would recommend [facility] for CHF follow up and HHPT at dc.  Will follow for orders.   Anticipated DC Date:  08/20/2014   Anticipated DC Plan:  [place]  CM consult      Choice offered to / List presented to:             Status of service:  In process, will continue to follow Medicare Important Message given?  Yes (If response is "NO", the following Medicare IM given date fields will be blank) Date Medicare IM given:  08/21/2014 Medicare IM given by:  Jonnie Finner RN  Date Additional Medicare IM given:   Additional Medicare IM given by:    Discharge Disposition:    Per UR Regulation:  Reviewed for med. necessity/level of care/duration of stay  If discussed at [place] of Stay Meetings, dates discussed:    Comments:  08/21/2014 1600 NCM spoke to pt and offered choice for [facility]. Provided pt with HHA list. Pt requesting [facility] for HH. States he really would like IP rehab. Waiting final recommendations at dc. Pt states he has RW, wheelchair, tub bench and ramp at home. Plans dc to home with [facility] if he cannot go to IP rehab. Will [place] orders. Contacted [facility] with referral. Jonnie Finner RN CCM Case Mgmt phone [phone]

## 2014-08-21 NOTE — Progress Notes (Signed)
Subjective: Continues to diurese with lasix drip 10 mg per hour.    Denies SOB.   Objective: Vital signs in last 24 hours: Temp:  [98.3 F (36.8 C)-98.5 F (36.9 C)] 98.4 F (36.9 C) (04/25 0609) Pulse Rate:  [58-64] 61 (04/25 0609) Resp:  [18-20] 18 (04/25 0609) BP: (116-130)/(40-93) 118/93 mmHg (04/25 0609) SpO2:  [92 %-96 %] 92 % (04/25 0609) Weight:  [346 lb 14.4 oz (157.353 kg)] 346 lb 14.4 oz (157.353 kg) (04/25 0609) Last BM Date: 08/20/14  Intake/Output from previous day: 04/24 0701 - 04/25 0700 In: 1102.7 [P.O.:900; I.V.:202.7] Out: 3026 [Urine:3025; Stool:1] Intake/Output this shift:    Medications Scheduled Meds: . acidophilus  1 capsule Oral Daily  . amLODipine  10 mg Oral Daily  . aspirin  162 mg Oral Daily  . barrier cream  1 application Topical BID  . calcium-vitamin D  1 tablet Oral BID WC  . cholecalciferol  5,000 Units Oral Daily  . fluticasone  2 spray Each Nare Daily  . heparin  5,000 Units Subcutaneous 3 times per day  . insulin aspart  0-9 Units Subcutaneous TID WC  . insulin detemir  28 Units Subcutaneous QHS  . labetalol  200 mg Oral BID  . latanoprost  1 drop Right Eye QHS  . loratadine  10 mg Oral QPM  . multivitamin with minerals  1 tablet Oral Daily  . potassium chloride SA  20 mEq Oral BID  . sodium chloride  3 mL Intravenous Q12H  . tamsulosin  0.4 mg Oral QHS  . vitamin B-12  1,000 mcg Oral Daily   Continuous Infusions: . furosemide (LASIX) infusion 10 mg/hr (08/20/14 2305)   PRN Meds:.sodium chloride, acetaminophen, HYDROcodone-acetaminophen, ondansetron (ZOFRAN) IV, sodium chloride, sodium chloride    Physical Exam: General: Lying in bed No resp difficulty HEENT: normal Neck: supple. JVP 9-10 . Carotids 2+ bilat; no bruits. No lymphadenopathy or thryomegaly appreciated. Cor: PMI nondisplaced. Regular rate & rhythm. No rubs, gallops or murmurs. Lungs: clear Abdomen: obese, soft, nontender, nondistended. No  hepatosplenomegaly. No bruits or masses. Good bowel sounds. Extremities: no cyanosis, clubbing, rash, R andLLE 1+edema Neuro: alert & orientedx3, cranial nerves grossly intact. moves all 4 extremities w/o difficulty. Affect pleasant  Telemetry: NSR 60s   Lab Results:  No results for input(s): WBC, HGB, HCT, PLT in the last 72 hours. BMET  Recent Labs  08/19/14 0556  NA 140  K 3.5  CL 98  CO2 30  GLUCOSE 120*  BUN 44*  CREATININE 2.58*  CALCIUM 9.3    Assessment/Plan   Principal Problem:   Acute on chronic diastolic heart failure Active Problems:   Mixed hyperlipidemia   Morbid obesity-BMI 51   Essential hypertension, benign   CAD- mild non obstructive Oct 2006   SVT/ PSVT/ PAT   CKD (chronic kidney disease) stage 3, GFR 30-59 ml/min   Sleep apnea- on C-pap   Pressure ulcer, stage 2, left posterior thigh at crease of butt  Continues to diurese with lasix drip. BMET pending. If renal function ok will continue to aggressively diurese.    Consult OT/PT.    LOS: 5 days    CLEGG,AMY PA-C 08/21/2014 8:14 AM  Patient seen and examined with Darrick Grinder, NP. We discussed all aspects of the encounter. I agree with the assessment and plan as stated above.   Diuresing well. Renal function stable. Continue IV lasix likely one more day and switch to po. PT has seen. Will place consult  for CIR. If not candidate will consider [facility]  Benay Spice 12:38 PM
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
## 2014-08-21 NOTE — Progress Notes (Signed)
Patient has home CPAP bedside that he places on when ready for bed.

## 2014-08-21 NOTE — Progress Notes (Signed)
Physical Therapy Treatment Patient Details Name: [name] [name] MRN: [id number] DOB: 03-Mar-1932 Today's Date: 08/21/2014    History of Present Illness [name] [name] is a 81 y.o.male history of chronic diastolic heart failure, PSVT, non-obstructive CAD, HL, DM2, OSA , left eye blindness admitted with severe weight gain, edema, and SOB [name] to dietary noncompliance    PT Comments    Pt remains pleasant and willing to ambulate today. Pt able to ambulate in hall with chair to follow and seated rest between each bout. Pt continues to need additional assist and therapy for discharge and pt encouraged to ambulate in different clothing so that his wound is not irritated. Continue to recommend SNF to maximize function and strength. Will continue to follow.   Follow Up Recommendations  SNF;Supervision for mobility/OOB, (HHPT and aide if pt refuses SNF)     Equipment Recommendations       Recommendations for Other Services       Precautions / Restrictions Precautions Precautions: Fall    Mobility  Bed Mobility Overal bed mobility: Needs Assistance Bed Mobility: Rolling;Sidelying to Sit Rolling: Supervision Sidelying to sit: Supervision       General bed mobility comments: cues for sequence and breathing as pt with tendency to hold his breath throughout with use of rail to rise from side  Transfers Overall transfer level: Needs assistance   Transfers: Sit to/from Stand Sit to Stand: Min assist;Min guard         General transfer comment: pt stood from bed and then from chair 2x with min assist final stand from chair minguard with cues for hand placement and safety.   Ambulation/Gait Ambulation/Gait assistance: Min guard Ambulation Distance (Feet): 50 Feet Assistive device: Rolling walker (2 wheeled) Gait Pattern/deviations: Step-through pattern;Trunk flexed;Wide base of support;Decreased stride length   Gait velocity interpretation: Below normal speed for age/gender General Gait  Details: Pt walked 30', 40', 34', 50' respectively with seated rest grossly 2 min between each trial. HR 65-71 with sats maintained at 90-94% on RA other than one drop to 87% on 2nd gait trial which corrected with seated rest   Stairs            Wheelchair Mobility    Modified Rankin (Stroke Patients Only)       Balance Overall balance assessment: Needs assistance   Sitting balance-Leahy Scale: Good       Standing balance-Leahy Scale: Poor                      Cognition Arousal/Alertness: Awake/alert Behavior During Therapy: WFL for tasks assessed/performed Overall Cognitive Status: Within Functional Limits for tasks assessed                      Exercises      General Comments        Pertinent Vitals/Pain Pain Assessment: No/denies pain    Home Living                      Prior Function            PT Goals (current goals can now be found in the care plan section) Progress towards PT goals: Progressing toward goals    Frequency       PT Plan Current plan remains appropriate    Co-evaluation             End of Session   Activity Tolerance: Patient tolerated treatment well Patient left:  in chair;with call bell/phone within reach     Time: 1120-1151 PT Time Calculation (min) (ACUTE ONLY): 31 min  Charges:  $Gait Training: 23-37 mins                    G Codes:      Melford Aase 08-28-14, 1:49 PM Elwyn Reach, [place]

## 2014-08-22 LAB — BASIC METABOLIC PANEL
Anion gap: 11 (ref 5–15)
BUN: 50 mg/dL — ABNORMAL HIGH (ref 6–23)
CALCIUM: 8.7 mg/dL (ref 8.4–10.5)
CO2: 28 mmol/L (ref 19–32)
Chloride: 99 mmol/L (ref 96–112)
Creatinine, Ser: 2.53 mg/dL — ABNORMAL HIGH (ref 0.50–1.35)
GFR calc Af Amer: 26 mL/min — ABNORMAL LOW (ref >90)
GFR, EST NON AFRICAN AMERICAN: 22 mL/min — AB (ref >90)
GLUCOSE: 124 mg/dL — AB (ref 70–99)
Potassium: 3.9 mmol/L (ref 3.5–5.1)
Sodium: 138 mmol/L (ref 135–145)

## 2014-08-22 LAB — GLUCOSE, CAPILLARY
GLUCOSE-CAPILLARY: 141 mg/dL — AB (ref 70–99)
Glucose-Capillary: 132 mg/dL — ABNORMAL HIGH (ref 70–99)
Glucose-Capillary: 150 mg/dL — ABNORMAL HIGH (ref 70–99)
Glucose-Capillary: 196 mg/dL — ABNORMAL HIGH (ref 70–99)

## 2014-08-22 NOTE — Progress Notes (Signed)
Subjective: Continues to diurese with lasix drip 10 mg per hour. Weight down another 2 pounds. (22 pounds total)   Denies SOB.   Objective: Vital signs in last 24 hours: Temp:  [97.6 F (36.4 C)-98.2 F (36.8 C)] 98.2 F (36.8 C) (04/26 0557) Pulse Rate:  [56-63] 63 (04/26 0557) Resp:  [18] 18 (04/26 0557) BP: (100-119)/(33-61) 119/53 mmHg (04/26 0557) SpO2:  [91 %-95 %] 91 % (04/26 0557) Weight:  [344 lb 14.4 oz (156.446 kg)] 344 lb 14.4 oz (156.446 kg) (04/26 0557) Last BM Date: 08/21/14  Intake/Output from previous day: 04/25 0701 - 04/26 0700 In: 1677.3 [P.O.:1400; I.V.:277.3] Out: 2725 [Urine:2725] Intake/Output this shift:    Medications Scheduled Meds: . acidophilus  1 capsule Oral Daily  . amLODipine  10 mg Oral Daily  . aspirin  162 mg Oral Daily  . barrier cream  1 application Topical BID  . calcium-vitamin D  1 tablet Oral BID WC  . cholecalciferol  5,000 Units Oral Daily  . fluticasone  2 spray Each Nare Daily  . heparin  5,000 Units Subcutaneous 3 times per day  . insulin aspart  0-9 Units Subcutaneous TID WC  . insulin detemir  28 Units Subcutaneous QHS  . labetalol  200 mg Oral BID  . latanoprost  1 drop Right Eye QHS  . loratadine  10 mg Oral QPM  . multivitamin with minerals  1 tablet Oral Daily  . potassium chloride SA  20 mEq Oral BID  . sodium chloride  3 mL Intravenous Q12H  . tamsulosin  0.4 mg Oral QHS  . vitamin B-12  1,000 mcg Oral Daily   Continuous Infusions: . furosemide (LASIX) infusion 10 mg/hr (08/21/14 2210)   PRN Meds:.sodium chloride, acetaminophen, HYDROcodone-acetaminophen, ondansetron (ZOFRAN) IV, sodium chloride, sodium chloride    Physical Exam: General: Lying in bed No resp difficulty HEENT: normal Neck: supple. JVP 10 . Carotids 2+ bilat; no bruits. No lymphadenopathy or thryomegaly appreciated. Cor: PMI nondisplaced. Regular rate & rhythm. No rubs, gallops or murmurs. Lungs: clear Abdomen: obese, soft,  nontender, nondistended. No hepatosplenomegaly. No bruits or masses. Good bowel sounds. Extremities: no cyanosis, clubbing, rash, R andLLE tr edema Neuro: alert & orientedx3, cranial nerves grossly intact. moves all 4 extremities w/o difficulty. Affect pleasant  Telemetry: NSR 60s   Lab Results:  No results for input(s): WBC, HGB, HCT, PLT in the last 72 hours. BMET  Recent Labs  08/21/14 1035 08/22/14 0515  NA 138 138  K 3.8 3.9  CL 99 99  CO2 28 28  GLUCOSE 184* 124*  BUN 47* 50*  CREATININE 2.58* 2.53*  CALCIUM 9.0 8.7    Assessment/Plan   Principal Problem:   Acute on chronic diastolic heart failure Active Problems:   Mixed hyperlipidemia   Morbid obesity-BMI 51   Essential hypertension, benign   CAD- mild non obstructive Oct 2006   SVT/ PSVT/ PAT   CKD (chronic kidney disease) stage 3, GFR 30-59 ml/min   Sleep apnea- on C-pap   Pressure ulcer, stage 2, left posterior thigh at crease of butt  Continues to diurese with lasix drip. Renal function stable.    Consult OT/PT. -->SNF versus HHPT. Consult SW.    LOS: 6 days    CLEGG,AMY NP-C 08/22/2014 8:11 AM  Patient seen and examined with Darrick Grinder, NP. We discussed all aspects of the encounter. I agree with the assessment and plan as stated above.   He is very close to euvolemic but neck  veins still up a bit. Continue IV diuresis one more day. Long discussion about need for SNF but he adamantly refuses and will only do CIR or go home. He does not qualify for CIR. Will complete diuresis today and send home tomorrow with [facility] following.   Daniel Bensimhon,MD 1:58 PM

## 2014-08-22 NOTE — Consult Note (Signed)
   [facility] CM Inpatient Consult   08/22/2014  Damarko [facility] 08-20-1931 [id number] Patient had previous history with [facility] Care Management. Met with patient regarding restart of services.  Patient states that he did not want anything that would interfere with his Home Health services.  Explained the differences between care management and home health.  Patient verbalized understanding.  Patient would like to think about the restart of services and wanted this writer to check back a little later.  Patient had some specific request that this writer will follow up on and get back with patient. Will follow up with [place] Management office staff as well.   For additional questions, please contact: Natividad Brood, RN BSN [facility] Liaison  [phone] business mobile phone

## 2014-08-22 NOTE — Progress Notes (Addendum)
Another CSW order received to assist patient with SNF rehab placement. This Education officer, museum as well as Liz Beach, LCSWA have met with patient and attempted to convince him of need/benefits of short term SNF without success.  He is adamant that he will return home. He has also refused SNF option with Physical Therapist.  Patient has been in a SNF in the past with mixed feelings about the experience. He does not wish to be separated from his wife and will accept [place].  RNCM is aware of same.  CSW will sign off again but will be available if needed. Patient evaluated by CIR and deemed not to be appropriate- with SNF recommendation in place.  Will recommend Home Health with [facility]  (PT/0T, RN, SW, Aide) to assist as much as possible with home care.  Patient states they can hire private duty care if needed as well.    Lorie Phenix. Pauline Good, [place]

## 2014-08-22 NOTE — Progress Notes (Signed)
Inpatient Rehabilitation  Per Dr. Naaman Plummer' consult note, pt. would be best served in a SNF for longer rehab recovery time period and is not appropriate for CIR at this time.   I discussed with patient and he is declining SNF.  He is requesting a motorized scooter.  I advised him that he needs to contact his PCP.  Also discussed with Natividad Brood from [facility] who says they can assist in the process if pt. re-signs with them.  I have updated Ellan Lambert, CM of plans and recommendations.   I will sign off.  Please call if questions.  [place] Admissions Coordinator Cell [phone] Office [phone]

## 2014-08-22 NOTE — Evaluation (Signed)
Occupational Therapy Evaluation Patient Details Name: [name] MRN: [id number] DOB: 05-30-1931 Today's Date: 08/22/2014    History of Present Illness [name] is a 81 y.o.male history of chronic diastolic heart failure, PSVT, non-obstructive CAD, HL, DM2, OSA , left eye blindness admitted with severe weight gain, edema, and SOB due to dietary noncompliance   Clinical Impression   Pt with decline in function and safety with ADLs and ADL mobility with decreased strength, balance and endurance. Pt reusing SNF rehab a she is not a candidate for CIR. Physician in to see pt at end of session and recommending SNF rehab as well, however pt continues to refuse. Pt would benefit form acute OT services to address impairments to increase level if function and safety    Follow Up Recommendations  SNF;Supervision/Assistance - 24 hour    Equipment Recommendations  Other (comment) (reacher)    Recommendations for Other Services       Precautions / Restrictions Precautions Precautions: Fall Restrictions Weight Bearing Restrictions: No      Mobility Bed Mobility Overal bed mobility: Needs Assistance Bed Mobility: Rolling;Sidelying to Sit;Supine to Sit;Sit to Supine Rolling: Supervision Sidelying to sit: Supervision Supine to sit: Min assist;HOB elevated     General bed mobility comments: cues for sequence and breathing as pt with tendency to hold his breath throughout with use of rail to rise from side  Transfers                 General transfer comment: pt refused OOB activity, standing/transfers    Balance   Sitting-balance support: No upper extremity supported;Feet supported Sitting balance-Leahy Scale: Good         Standing balance comment: pt refused OOB activity, standing/transfers                            ADL Overall ADL's : Needs assistance/impaired     Grooming: Wash/dry hands;Wash/dry face;Set up;Supervision/safety;Sitting   Upper Body  Bathing: Supervision/ safety;Set up;Sitting   Lower Body Bathing: Maximal assistance   Upper Body Dressing : Supervision/safety;Set up;Sitting   Lower Body Dressing: Total assistance     Toilet Transfer Details (indicate cue type and reason): per PT notes, pt is min A-mon guard A for transfers   Toileting - Clothing Manipulation Details (indicate cue type and reason): pt refused OOB activity, standing/transfers   Tub/Shower Transfer Details (indicate cue type and reason): pt refused OOB activity, standing/transfers Functional mobility during ADLs:  (pt refused OOB activity, standing/transfers) General ADL Comments: pt refusing SNF rebab, but states that he would stay for CIR. OT and pyhsician informed pt that he is not a candidate for CIR. per PT notes, pt is min A-mon guard A for transfers     Vision  wears glasses, no change from baseline   Perception Perception Perception Tested?: No   Praxis Praxis Praxis tested?: Not tested    Pertinent Vitals/Pain Pain Assessment: No/denies pain     Hand Dominance Right   Extremity/Trunk Assessment Upper Extremity Assessment Upper Extremity Assessment: Generalized weakness       Cervical / Trunk Assessment Cervical / Trunk Assessment: Kyphotic   Communication Communication Communication: No difficulties   Cognition Arousal/Alertness: Awake/alert Behavior During Therapy: WFL for tasks assessed/performed Overall Cognitive Status: Within Functional Limits for tasks assessed                     General Comments   pt pleasant  Home Living Family/patient expects to be discharged to:: Private residence Living Arrangements: Spouse/significant other Available Help at Discharge: Family Type of Home: House Home Access: [place]: One level     Bathroom Shower/Tub: Teacher, early years/pre: Handicapped height Bathroom Accessibility: Yes   Home Equipment: Environmental consultant -  2 wheels;Wheelchair - [employer];Tub bench          Prior Functioning/Environment Level of Independence: Independent with assistive device(s);Needs assistance  Gait / Transfers Assistance Needed: pt states he only walks grossly 39' with Rw at home and otherwise uses a WC ADL's / Homemaking Assistance Needed: does not do homemaking, recently has needed assist for lower body bathing and dressing but reports baseline he performs for himself   Comments: pt states that he does not leave the house and mostly stays in bed with his pajamas on    OT Diagnosis: Generalized weakness   OT Problem List: Decreased strength;Decreased activity tolerance;Impaired balance (sitting and/or standing);Obesity   OT Treatment/Interventions: Self-care/ADL training;Therapeutic exercise;Therapeutic activities;Patient/family education;DME and/or AE instruction    OT Goals(Current goals can be found in the care plan section) Acute Rehab OT Goals Patient Stated Goal: return home OT Goal Formulation: With patient Time For Goal Achievement: 08/29/14 Potential to Achieve Goals: Good ADL Goals Pt Will Perform Grooming: with min assist;with min guard assist;standing Pt Will Perform Lower Body Bathing: with mod assist;sit to/from stand Pt Will Perform Lower Body Dressing: with mod assist Pt Will Transfer to Toilet: with min guard assist;with supervision Pt Will Perform Toileting - Clothing Manipulation and hygiene: with mod assist;with min assist  OT Frequency: Min 2X/week   Barriers to D/C: Decreased caregiver support  uncertain of pt's wife can provide level of care required                      End of Session    Activity Tolerance: Patient tolerated treatment well Patient left: in bed;Other (comment) (physician in room)   Time: 6283-6629 OT Time Calculation (min): 25 min Charges:  OT General Charges $OT Visit: 1 Procedure OT Evaluation $Initial OT Evaluation Tier I: 1 Procedure OT  Treatments $Therapeutic Activity: 8-22 mins G-Codes:    [name] 08/22/2014, 1:18 PM

## 2014-08-22 NOTE — Progress Notes (Signed)
RN educated importance of pt going to SNF at DC.  Pt wanting RN to clean him up and is not able to do these things independently or with minimal assistance.  Expressed concern for pt at home by himself and who would take care of him.  Pt would not answer RN's questions.  Will continue to monitor.

## 2014-08-23 LAB — BASIC METABOLIC PANEL
ANION GAP: 9 (ref 5–15)
BUN: 49 mg/dL — AB (ref 6–23)
CHLORIDE: 100 mmol/L (ref 96–112)
CO2: 28 mmol/L (ref 19–32)
Calcium: 8.8 mg/dL (ref 8.4–10.5)
Creatinine, Ser: 2.47 mg/dL — ABNORMAL HIGH (ref 0.50–1.35)
GFR calc Af Amer: 27 mL/min — ABNORMAL LOW (ref >90)
GFR calc non Af Amer: 23 mL/min — ABNORMAL LOW (ref >90)
Glucose, Bld: 110 mg/dL — ABNORMAL HIGH (ref 70–99)
Potassium: 3.7 mmol/L (ref 3.5–5.1)
Sodium: 137 mmol/L (ref 135–145)

## 2014-08-23 LAB — GLUCOSE, CAPILLARY: GLUCOSE-CAPILLARY: 112 mg/dL — AB (ref 70–99)

## 2014-08-23 MED ORDER — POTASSIUM CHLORIDE CRYS ER 20 MEQ PO TBCR: 40.0000 meq | EXTENDED_RELEASE_TABLET | Freq: Once | ORAL | Status: DC

## 2014-08-23 MED ORDER — TORSEMIDE 20 MG PO TABS
60.0000 mg | ORAL_TABLET | Freq: Every day | ORAL | Status: DC
  Filled 2014-08-23 (×2): qty 3

## 2014-08-23 MED ORDER — TORSEMIDE 20 MG PO TABS
40.0000 mg | ORAL_TABLET | Freq: Every day | ORAL | Status: DC
  Filled 2014-08-23: qty 2

## 2014-08-23 MED ORDER — POTASSIUM CHLORIDE CRYS ER 20 MEQ PO TBCR
40.0000 meq | EXTENDED_RELEASE_TABLET | Freq: Every day | ORAL | Status: DC
  Administered 2014-08-23: 40 meq via ORAL

## 2014-08-23 MED ORDER — POTASSIUM CHLORIDE CRYS ER 20 MEQ PO TBCR: EXTENDED_RELEASE_TABLET | ORAL | Status: DC

## 2014-08-23 MED ORDER — TORSEMIDE 20 MG PO TABS: ORAL_TABLET | ORAL | Status: DC

## 2014-08-23 MED ORDER — TORSEMIDE 20 MG PO TABS
60.0000 mg | ORAL_TABLET | Freq: Every day | ORAL | Status: DC
  Filled 2014-08-23: qty 3

## 2014-08-23 MED ORDER — POTASSIUM CHLORIDE CRYS ER 20 MEQ PO TBCR: 20.0000 meq | EXTENDED_RELEASE_TABLET | Freq: Every day | ORAL | Status: DC

## 2014-08-23 NOTE — Progress Notes (Signed)
Subjective: Continues to diurese with lasix drip 10 mg per hour. Weight down another 2 pounds. (27 pounds total)   Denies SOB. Wants to go home .  Objective: Vital signs in last 24 hours: Temp:  [98 F (36.7 C)-98.5 F (36.9 C)] 98.5 F (36.9 C) (04/27 0529) Pulse Rate:  [61-66] 62 (04/27 0529) Resp:  [18-20] 20 (04/27 0529) BP: (99-124)/(33-70) 100/70 mmHg (04/27 0529) SpO2:  [94 %-96 %] 96 % (04/27 0529) Weight:  [341 lb 11.2 oz (154.994 kg)] 341 lb 11.2 oz (154.994 kg) (04/27 0529) Last BM Date: 08/22/14  Intake/Output from previous day: 04/26 0701 - 04/27 0700 In: 1165 [P.O.:925; I.V.:240] Out: 3025 [Urine:3025] Intake/Output this shift:    Medications Scheduled Meds: . acidophilus  1 capsule Oral Daily  . amLODipine  10 mg Oral Daily  . aspirin  162 mg Oral Daily  . barrier cream  1 application Topical BID  . calcium-vitamin D  1 tablet Oral BID WC  . cholecalciferol  5,000 Units Oral Daily  . fluticasone  2 spray Each Nare Daily  . heparin  5,000 Units Subcutaneous 3 times per day  . insulin aspart  0-9 Units Subcutaneous TID WC  . insulin detemir  28 Units Subcutaneous QHS  . labetalol  200 mg Oral BID  . latanoprost  1 drop Right Eye QHS  . loratadine  10 mg Oral QPM  . multivitamin with minerals  1 tablet Oral Daily  . potassium chloride  20 mEq Oral q1800  . potassium chloride SA  40 mEq Oral Daily  . sodium chloride  3 mL Intravenous Q12H  . tamsulosin  0.4 mg Oral QHS  . [START ON 08/24/2014] torsemide  60 mg Oral Daily   And  . torsemide  40 mg Oral q1800  . vitamin B-12  1,000 mcg Oral Daily   Continuous Infusions:   PRN Meds:.sodium chloride, acetaminophen, HYDROcodone-acetaminophen, ondansetron (ZOFRAN) IV, sodium chloride, sodium chloride    Physical Exam: General: Lying in bed No resp difficulty HEENT: normal Neck: supple. JVP 10 . Carotids 2+ bilat; no bruits. No lymphadenopathy or thryomegaly appreciated. Cor: PMI nondisplaced.  Regular rate & rhythm. No rubs, gallops or murmurs. Lungs: clear Abdomen: obese, soft, nontender, nondistended. No hepatosplenomegaly. No bruits or masses. Good bowel sounds. Extremities: no cyanosis, clubbing, rash, R andLLE tr edema Neuro: alert & orientedx3, cranial nerves grossly intact. moves all 4 extremities w/o difficulty. Affect pleasant  Telemetry: NSR 60s   Lab Results:  No results for input(s): WBC, HGB, HCT, PLT in the last 72 hours. BMET  Recent Labs  08/21/14 1035 08/22/14 0515 08/23/14 0536  NA 138 138 137  K 3.8 3.9 3.7  CL 99 99 100  CO2 28 28 28   GLUCOSE 184* 124* 110*  BUN 47* 50* 49*  CREATININE 2.58* 2.53* 2.47*  CALCIUM 9.0 8.7 8.8    Assessment/Plan   Principal Problem:   Acute on chronic diastolic heart failure Active Problems:   Mixed hyperlipidemia   Morbid obesity-BMI 51   Essential hypertension, benign   CAD- mild non obstructive Oct 2006   SVT/ PSVT/ PAT   CKD (chronic kidney disease) stage 3, GFR 30-59 ml/min   Sleep apnea- on C-pap   Pressure ulcer, stage 2, left posterior thigh at crease of butt  Volume status stable. Stop IV lasix and start torsemide 60 mg in am and 40 mg in pm with 40 meq potassium in am and 20 meq pm.   Home today  with HHRN and HHPT. Follow up in HF clinic next week May 4 at 2:40    LOS: 7 days    CLEGG,AMY NP-C 08/23/2014 8:34 AM  Patient seen and examined with Darrick Grinder, NP. We discussed all aspects of the encounter. I agree with the assessment and plan as stated above.   Volume status much improved. Will switch back to po diuretics. Absolutely refuses SNF despite multiple discussions. Will f/uin HF clinic.   Daniel Bensimhon,MD 9:20 AM

## 2014-08-23 NOTE — Progress Notes (Signed)
Assisted patient with home CPAP.  Bled in 2 LPM O2.

## 2014-08-23 NOTE — Discharge Summary (Signed)
Advanced Heart Failure Team  Discharge Summary   Patient ID: [name] MRN: [id number], DOB/AGE: 79-Feb-1933 79 y.o. Admit date: 08/16/2014 D/C date:     08/23/2014   Primary Discharge Diagnoses:  Acute on chronic diastolic heart failure  Mixed hyperlipidemia  Morbid obesity-BMI 51  Essential hypertension, benign  CAD- mild non obstructive Oct 2006  SVT/ PSVT/ PAT  CKD (chronic kidney disease) stage 3, GFR 30-59 ml/min  Sleep apnea- on C-pap  Pressure ulcer, stage 2, left posterior thigh at crease of butt- present on admit   Hospital Course:  Mr. Rubey is a 81 y.o.male history of chronic diastolic heart failure, PSVT, non-obstructive CAD, HL, DM2, OSA admitted with severe weight gain, edema, and SOB.  He was diuresed with lasix drip and transitioned to torsemide 60 mg in am and 40 mg in pm. Overall he diuresed 27 pounds. Renal function followed closely and remained stable. He will resume metolazone twice a week. ECHO completed with EF 45-50% and Grade II DD. Lengthy discussions occurred regarding limiting fluid intake, low salt food choices, and daily weights.    Due to immobility PT consulted with recommendations for SNF however he refuses and agreed to home health. He will continue to be followed closely in the HF clinic and has follow up next week. Plan to check BMET at that time.     Discharge Weight Range: 341 pounds.  Discharge Vitals: Blood pressure 100/70, pulse 62, temperature 98.5 F (36.9 C), temperature source Oral, resp. rate 20, height 6' (1.829 m), weight 341 lb 11.2 oz (154.994 kg), SpO2 96 %.  Labs: Lab Results  Component Value Date   WBC 3.2* 08/17/2014   HGB 8.8* 08/17/2014   HCT 27.7* 08/17/2014   MCV 92.0 08/17/2014   PLT 81* 08/17/2014    Recent Labs Lab 08/23/14 0536  NA 137  K 3.7  CL 100  CO2 28  BUN 49*  CREATININE 2.47*  CALCIUM 8.8  GLUCOSE 110*   Lab Results  Component Value Date   CHOL 106 04/15/2013   HDL 36* 04/15/2013    LDLCALC 52 04/15/2013   TRIG 91 04/15/2013   BNP (last 3 results)  Recent Labs  08/16/14 1945  BNP 869.7*    ProBNP (last 3 results) No results for input(s): PROBNP in the last 8760 hours.   Diagnostic Studies/Procedures   No results found.  Discharge Medications     Medication List    TAKE these medications        ALIGN PO  Take 1 tablet by mouth daily.     amLODipine 10 MG tablet  Commonly known as:  NORVASC  Take 1 tablet (10 mg total) by mouth daily.     aspirin 81 MG chewable tablet  Chew 2 tablets (162 mg total) by mouth daily.     benzoyl peroxide 10 % gel  Apply 1 application topically 3 (three) times daily.     calcium-vitamin D 500-200 MG-UNIT per tablet  Commonly known as:  OSCAL WITH D  Take 1 tablet by mouth 2 (two) times daily.     cetirizine 10 MG tablet  Commonly known as:  ZYRTEC  Take 10 mg by mouth every evening.     cyanocobalamin 1000 MCG tablet  Take 1 tablet (1,000 mcg total) by mouth daily.     D-3-5 5000 UNITS capsule  Generic drug:  Cholecalciferol  Take 5,000 Units by mouth daily.     fluticasone 50 MCG/ACT nasal spray  Commonly known  as:  FLONASE  Place 2 sprays into the nose daily.     HYDROcodone-acetaminophen 5-325 MG per tablet  Commonly known as:  NORCO/VICODIN  Take 1-2 tablets by mouth every 6 (six) hours as needed for moderate pain.     insulin aspart 100 UNIT/ML injection  Commonly known as:  novoLOG  Inject into the skin 3 (three) times daily before meals. Sliding scale based on sugar level     insulin detemir 100 UNIT/ML injection  Commonly known as:  LEVEMIR  Inject 0.15 mLs (15 Units total) into the skin at bedtime.     labetalol 200 MG tablet  Commonly known as:  NORMODYNE  Take 1 tablet (200 mg total) by mouth 2 (two) times daily.     latanoprost 0.005 % ophthalmic solution  Commonly known as:  XALATAN  Place 1 drop into the right eye at bedtime.     lidocaine 5 %  Commonly known as:  LIDODERM   Place 1 patch onto the skin daily as needed (for pain). Remove & Discard patch within 12 hours or as directed by MD.     metolazone 2.5 MG tablet  Commonly known as:  ZAROXOLYN  Take 1 tablet (2.5 mg total) by mouth 2 (two) times a week.     multivitamin tablet  Take 1 tablet by mouth daily.     mupirocin ointment 2 %  Commonly known as:  BACTROBAN  Apply 1 application topically 2 (two) times daily as needed (ulcer on groin).     potassium chloride SA 20 MEQ tablet  Commonly known as:  K-DUR,KLOR-CON  Take 40 meq in am and 20 meq in pm     sodium chloride 0.65 % Soln nasal spray  Commonly known as:  OCEAN  Place 1 spray into both nostrils as needed for congestion.     spironolactone 25 MG tablet  Commonly known as:  ALDACTONE  Take 1 tablet (25 mg total) by mouth daily.     tamsulosin 0.4 MG Caps capsule  Commonly known as:  FLOMAX  Take 0.4 mg by mouth at bedtime.     torsemide 20 MG tablet  Commonly known as:  DEMADEX  Take 60 mg in am 40 mg in pm        Disposition   The patient will be discharged in stable condition to home.     Discharge Instructions    Contraindication to ACEI at discharge    Complete by:  As directed      Diet - low sodium heart healthy    Complete by:  As directed      Heart Failure patients record your daily weight using the same scale at the same time of day    Complete by:  As directed      Increase activity slowly    Complete by:  As directed           Follow-up Information    Follow up with Glori Bickers, MD On 08/30/2014.   Specialty:  Cardiology   Why:  at 2:40pm in the Advanced Heart Failure Clinic--gate code [id number]--please bring all medications to appt   Contact information:   [place] [address] [phone]         Duration of Discharge Encounter: Greater than 35 minutes   Signed, CLEGG,AMY NP-C  08/23/2014, 8:49 AM   Patient seen and examined with Darrick Grinder, NP. We discussed  all aspects of the encounter. I agree  with the assessment and plan as stated above.   Ready for d/c today. Continues to refuse SNF despite our urgings. Will follow closely in HF Clinic.   Mahsa Hanser,MD 9:22 AM

## 2014-08-23 NOTE — Progress Notes (Signed)
Pt. discharged home,with son. Given discharge instructions, questions answered. IV and Tele removed. Foley removed earlier in the shift pt voided,no complications. Dressed and taken down via wheelchair by security.

## 2014-08-23 NOTE — Progress Notes (Signed)
Heart Failure Navigator Consult Note  Presentation: [name] is a 81 y.o.male history of chronic diastolic heart failure, PSVT, non-obstructive CAD, HL, DM2, OSA admitted with severe weight gain, edema, and SOB.   Had been on toresemide 40mg  bid as well as metolazone 2.5mg  twice a week, however he recently on his own stopped metolazone. Has been compliant with toresmide. Seen by Dr Domenic Polite 08/15/14 in clinic with noted 20 lbs weight gain since February, and a reported 40 lbs weight gain since January. He was been referred for admission for IV diuresis and management by the CHF service.  Reports several week history of worsening LE edema, abdominal distension, SOB and DOE. Reports DOE just walking from his bed back and forth to bathroom. Reports dietary indiscretions, eating salted triscuits and barbecue. No chest pain, no palpitations.   Past Medical History  Diagnosis Date  . PSVT (paroxysmal supraventricular tachycardia)     AVNRT  . Coronary atherosclerosis of native coronary artery     Nonobstructive, LVEF 65%  . Hyperlipidemia   . Morbid obesity   . Arthritis   . Type 2 diabetes mellitus   . Vertigo   . Venous insufficiency   . Sleep apnea, obstructive   . Chronic diastolic heart failure     History   Social History  . Marital Status: Married    Spouse Name: N/A  . Number of Children: N/A  . Years of Education: N/A   Occupational History  . Retired    Social History Main Topics  . Smoking status: Never Smoker   . Smokeless tobacco: Never Used  . Alcohol Use: No  . Drug Use: No  . Sexual Activity: Not on file   Other Topics Concern  . None   Social History Narrative    ECHO:Study Conclusions--08/18/14  - Left ventricle: The cavity size was normal. Wall thickness was increased in a pattern of severe LVH. Systolic function was mildly reduced. The estimated ejection fraction was in the range of 45% to 50%. Diffuse hypokinesis. Features are consistent  with a pseudonormal left ventricular filling pattern, with concomitant abnormal relaxation and increased filling pressure (grade 2 diastolic dysfunction). Doppler parameters are consistent with high ventricular filling pressure. - Aortic valve: Valve area (VTI): 2.38 cm^2. Valve area (Vmax): 2.16 cm^2. Valve area (Vmean): 2.19 cm^2. - Mitral valve: Calcified annulus. Valve area by continuity equation (using LVOT flow): 2.49 cm^2. - Right atrium: The atrium was mildly dilated. - Pulmonary arteries: PA peak pressure: 37 mm Hg (S).  Transthoracic echocardiography. M-mode, complete 2D, spectral Doppler, and color Doppler. Birthdate: Patient birthdate: 03-21-1932. Age: Patient is 81 yr old. Sex: Gender: male. BMI: 48.6 kg/m^2. Blood pressure:   134/79 Patient status: Inpatient. Study date: Study date: 08/18/2014. Study time: 01:52 PM. Location: Bedside.  BNP    Component Value Date/Time   BNP 869.7* 08/16/2014 1945    ProBNP    Component Value Date/Time   PROBNP 1021.0* 04/14/2013 2050     Education Assessment and Provision:  Detailed education and instructions provided on heart failure disease management including the following:  Signs and symptoms of Heart Failure When to call the physician Importance of daily weights Low sodium diet Fluid restriction Medication management Anticipated future follow-up appointments  Patient education given on each of the above topics.  Patient acknowledges understanding and acceptance of all instructions.  I spoke briefly with [name] is a patient of the AHF Clinic.  We discussed HF recommendations.  He says that he weighs  daily and I reminded him when to contact the clinic related to his weights.  He says that his wife does his cooking.  I emphasized that he needs to stick with a low sodium diet and we discussed high sodium foods to avoid.  He says that he gets and takes all medications as prescribed.  He and  his wife live in [place] and he is quite reluctant to leave her for any sort of rehab placement -even though I doubt his ability to care for himself at home.  He has follow-up appt scheduled in the AHF Clinic on May 4th at 2:40pm.   Education Materials:  "Living Better With Heart Failure" Booklet, Daily Weight Tracker Tool    High Risk Criteria for Readmission and/or Poor Patient Outcomes:  (Recommend Follow-up with Advanced Heart Failure Clinic)--yes   EF <30%- No 45-50% with grade 2 dias dys  2 or more admissions in 6 months- No  Difficult social situation-  Yes lives home in [place] with wife --? Ability to care for him at home  Demonstrates medication noncompliance- No    Barriers of Care:  Knowledge and compliance  Discharge Planning:   Plans to discharge to home with wife--he refuses SNF.  He will have HHRN and HHPT.  [facility] care manager has also picked him up and will contact him as an outpatient.

## 2014-08-24 DIAGNOSIS — I5031 Acute diastolic (congestive) heart failure: Secondary | ICD-10-CM | POA: Diagnosis not present

## 2014-08-24 DIAGNOSIS — Z5181 Encounter for therapeutic drug level monitoring: Secondary | ICD-10-CM | POA: Diagnosis not present

## 2014-08-24 DIAGNOSIS — S71102D Unspecified open wound, left thigh, subsequent encounter: Secondary | ICD-10-CM | POA: Diagnosis not present

## 2014-08-24 DIAGNOSIS — I2510 Atherosclerotic heart disease of native coronary artery without angina pectoris: Secondary | ICD-10-CM | POA: Diagnosis not present

## 2014-08-24 DIAGNOSIS — M150 Primary generalized (osteo)arthritis: Secondary | ICD-10-CM | POA: Diagnosis not present

## 2014-08-24 DIAGNOSIS — I129 Hypertensive chronic kidney disease with stage 1 through stage 4 chronic kidney disease, or unspecified chronic kidney disease: Secondary | ICD-10-CM | POA: Diagnosis not present

## 2014-08-24 DIAGNOSIS — G4733 Obstructive sleep apnea (adult) (pediatric): Secondary | ICD-10-CM | POA: Diagnosis not present

## 2014-08-24 DIAGNOSIS — E782 Mixed hyperlipidemia: Secondary | ICD-10-CM | POA: Diagnosis not present

## 2014-08-24 DIAGNOSIS — E119 Type 2 diabetes mellitus without complications: Secondary | ICD-10-CM | POA: Diagnosis not present

## 2014-08-24 DIAGNOSIS — N183 Chronic kidney disease, stage 3 (moderate): Secondary | ICD-10-CM | POA: Diagnosis not present

## 2014-08-24 DIAGNOSIS — Z794 Long term (current) use of insulin: Secondary | ICD-10-CM | POA: Diagnosis not present

## 2014-08-25 ENCOUNTER — Other Ambulatory Visit: Payer: Self-pay | Admitting: *Deleted

## 2014-08-26 ENCOUNTER — Other Ambulatory Visit (HOSPITAL_COMMUNITY): Payer: Self-pay | Admitting: Internal Medicine

## 2014-08-28 ENCOUNTER — Telehealth (HOSPITAL_COMMUNITY): Payer: Self-pay

## 2014-08-28 DIAGNOSIS — S71102D Unspecified open wound, left thigh, subsequent encounter: Secondary | ICD-10-CM | POA: Diagnosis not present

## 2014-08-28 DIAGNOSIS — N183 Chronic kidney disease, stage 3 (moderate): Secondary | ICD-10-CM | POA: Diagnosis not present

## 2014-08-28 DIAGNOSIS — I5033 Acute on chronic diastolic (congestive) heart failure: Secondary | ICD-10-CM | POA: Diagnosis not present

## 2014-08-28 DIAGNOSIS — I501 Left ventricular failure: Secondary | ICD-10-CM | POA: Diagnosis not present

## 2014-08-28 DIAGNOSIS — I5031 Acute diastolic (congestive) heart failure: Secondary | ICD-10-CM | POA: Diagnosis not present

## 2014-08-28 DIAGNOSIS — I2510 Atherosclerotic heart disease of native coronary artery without angina pectoris: Secondary | ICD-10-CM | POA: Diagnosis not present

## 2014-08-28 DIAGNOSIS — E119 Type 2 diabetes mellitus without complications: Secondary | ICD-10-CM | POA: Diagnosis not present

## 2014-08-28 DIAGNOSIS — I129 Hypertensive chronic kidney disease with stage 1 through stage 4 chronic kidney disease, or unspecified chronic kidney disease: Secondary | ICD-10-CM | POA: Diagnosis not present

## 2014-08-28 NOTE — Telephone Encounter (Signed)
Debbie with [facility] PT to see patient 3 times weekly for 4 weeks until otherwise notified by our clinic after patient's appointment Wednesday.

## 2014-08-28 NOTE — Telephone Encounter (Signed)
[facility] PT Debbie called to get VO on frequency of visits for PT.  States patient is very deconditioned and "in bad shape" from physical standpoint.  Was only able to ambulate 70 ft before having to sit from being so tired.  Wants to know what our goals of PT are for this patient.  He is to be seen in our cilnic this Wednesday.  Will call Debbie after his visit and let her know our prognosis and expectation for PT for him.  Renee Pain

## 2014-08-30 ENCOUNTER — Ambulatory Visit (HOSPITAL_COMMUNITY)
Admission: RE | Admit: 2014-08-30 | Discharge: 2014-08-30 | Disposition: A | Discharge status: Home / Self Care | Payer: Medicare Other | Source: Ambulatory Visit | Attending: Internal Medicine | Admitting: Internal Medicine

## 2014-08-30 VITALS — BP 108/60 | Wt 347.4 lb

## 2014-08-30 DIAGNOSIS — Z79899 Other long term (current) drug therapy: Secondary | ICD-10-CM | POA: Insufficient documentation

## 2014-08-30 DIAGNOSIS — E785 Hyperlipidemia, unspecified: Secondary | ICD-10-CM | POA: Diagnosis not present

## 2014-08-30 DIAGNOSIS — Z9114 Patient's other noncompliance with medication regimen: Secondary | ICD-10-CM | POA: Insufficient documentation

## 2014-08-30 DIAGNOSIS — Z7982 Long term (current) use of aspirin: Secondary | ICD-10-CM | POA: Diagnosis not present

## 2014-08-30 DIAGNOSIS — M797 Fibromyalgia: Secondary | ICD-10-CM | POA: Diagnosis not present

## 2014-08-30 DIAGNOSIS — G4733 Obstructive sleep apnea (adult) (pediatric): Secondary | ICD-10-CM | POA: Diagnosis not present

## 2014-08-30 DIAGNOSIS — E119 Type 2 diabetes mellitus without complications: Secondary | ICD-10-CM | POA: Insufficient documentation

## 2014-08-30 DIAGNOSIS — I2510 Atherosclerotic heart disease of native coronary artery without angina pectoris: Secondary | ICD-10-CM | POA: Diagnosis not present

## 2014-08-30 DIAGNOSIS — I5031 Acute diastolic (congestive) heart failure: Secondary | ICD-10-CM | POA: Diagnosis not present

## 2014-08-30 DIAGNOSIS — Z794 Long term (current) use of insulin: Secondary | ICD-10-CM | POA: Diagnosis not present

## 2014-08-30 DIAGNOSIS — I5032 Chronic diastolic (congestive) heart failure: Secondary | ICD-10-CM | POA: Diagnosis not present

## 2014-08-30 DIAGNOSIS — I10 Essential (primary) hypertension: Secondary | ICD-10-CM | POA: Insufficient documentation

## 2014-08-30 DIAGNOSIS — I129 Hypertensive chronic kidney disease with stage 1 through stage 4 chronic kidney disease, or unspecified chronic kidney disease: Secondary | ICD-10-CM | POA: Diagnosis not present

## 2014-08-30 DIAGNOSIS — I5033 Acute on chronic diastolic (congestive) heart failure: Secondary | ICD-10-CM | POA: Diagnosis not present

## 2014-08-30 DIAGNOSIS — S71102D Unspecified open wound, left thigh, subsequent encounter: Secondary | ICD-10-CM | POA: Diagnosis not present

## 2014-08-30 DIAGNOSIS — N183 Chronic kidney disease, stage 3 (moderate): Secondary | ICD-10-CM | POA: Diagnosis not present

## 2014-08-30 DIAGNOSIS — G4730 Sleep apnea, unspecified: Secondary | ICD-10-CM

## 2014-08-30 LAB — BASIC METABOLIC PANEL
Anion gap: 14 (ref 5–15)
BUN: 47 mg/dL — AB (ref 6–20)
CO2: 25 mmol/L (ref 22–32)
Calcium: 9.2 mg/dL (ref 8.9–10.3)
Chloride: 100 mmol/L — ABNORMAL LOW (ref 101–111)
Creatinine, Ser: 2.67 mg/dL — ABNORMAL HIGH (ref 0.61–1.24)
GFR calc Af Amer: 24 mL/min — ABNORMAL LOW (ref >60)
GFR calc non Af Amer: 21 mL/min — ABNORMAL LOW (ref >60)
GLUCOSE: 115 mg/dL — AB (ref 70–99)
POTASSIUM: 4 mmol/L (ref 3.5–5.1)
SODIUM: 139 mmol/L (ref 135–145)

## 2014-08-30 NOTE — Patient Instructions (Signed)
Take 2.5 mg of Metolazone today  Take 20 MeQ of Potassium today  Labs today  Your physician recommends that you schedule a follow-up appointment in: 2 weeks  Do the following things EVERYDAY: 1) Weigh yourself in the morning before breakfast. Write it down and keep it in a log. 2) Take your medicines as prescribed 3) Eat low salt foods-Limit salt (sodium) to 2000 mg per day.  4) Stay as active as you can everyday 5) Limit all fluids for the day to less than 2 liters 6)

## 2014-08-30 NOTE — Progress Notes (Signed)
Patient ID: MARQUIZE SEIB, male   DOB: 11-Sep-1931, 81 y.o.   MRN: [id number]  Cardiologist: Dr Domenic Polite. PCP: Dr Gwyndolyn Saxon   HPI: Mr Martis is a 63 year with a PMH of morbid obesity, chronic diastolic heart failure, HTN, SVT/PSVT/PAT, CAD, DMII, OSA-CPAP, arthritis, fibromyalgia, and hyperlipidemia.   Admitted 2/26 with HF decompensation.  He was diuresed with IV lasix and transitioned to Demadex 60 mg daily. He required short course on inpatient rehab. Discharge weight 370 pounds.   Readmitted April 20 with marked volume overload. Diuresed 27 pounds with IV lasix. Discharged on torsemide 60 mg in and 40 mg in pm. Discharge weight 341 pounds.    [facility] Follow up for Heart Failure: Overall feeing ok.  Weight at home 335-342 pounds. Remains SOB with exertion. Denies PND/Orthopnea. Taking all medications. Uses wheelchair when he leaves the house. Eating hight salt foods for the last few days. Drinking extra fluid.   [facility] following.    SH: Lives in [place] with wife. No ETOH or tobacco abuse  ROS: All systems negative except as listed in HPI, PMH and Problem List.  Past Medical History  Diagnosis Date  . PSVT (paroxysmal supraventricular tachycardia)     AVNRT  . Coronary atherosclerosis of native coronary artery     Nonobstructive, LVEF 65%  . Hyperlipidemia   . Morbid obesity   . Arthritis   . Type 2 diabetes mellitus   . Vertigo   . Venous insufficiency   . Sleep apnea, obstructive   . Chronic diastolic heart failure     Current Outpatient Prescriptions  Medication Sig Dispense Refill  . amLODipine (NORVASC) 10 MG tablet Take 1 tablet (10 mg total) by mouth daily. 30 tablet 6  . aspirin 81 MG chewable tablet Chew 2 tablets (162 mg total) by mouth daily.    . benzoyl peroxide 10 % gel Apply 1 application topically 3 (three) times daily.    . calcium-vitamin D (OSCAL WITH D) 500-200 MG-UNIT per tablet Take 1 tablet by mouth 2 (two) times daily.    . cetirizine (ZYRTEC) 10 MG  tablet Take 10 mg by mouth every evening.     . Cholecalciferol (D-3-5) 5000 UNITS capsule Take 5,000 Units by mouth daily.     . fluticasone (FLONASE) 50 MCG/ACT nasal spray Place 2 sprays into the nose daily.     Marland Kitchen HYDROcodone-acetaminophen (NORCO/VICODIN) 5-325 MG per tablet Take 1-2 tablets by mouth every 6 (six) hours as needed for moderate pain. (Patient taking differently: Take 1 tablet by mouth every 6 (six) hours as needed for moderate pain. ) 30 tablet 0  . insulin aspart (NOVOLOG) 100 UNIT/ML injection Inject into the skin 3 (three) times daily before meals. Sliding scale based on sugar level    . insulin detemir (LEVEMIR) 100 UNIT/ML injection Inject 0.15 mLs (15 Units total) into the skin at bedtime. (Patient taking differently: Inject 28 Units into the skin at bedtime. ) 10 mL 12  . labetalol (NORMODYNE) 200 MG tablet Take 1 tablet (200 mg total) by mouth 2 (two) times daily. 60 tablet 1  . latanoprost (XALATAN) 0.005 % ophthalmic solution Place 1 drop into the right eye at bedtime.     . lidocaine (LIDODERM) 5 % Place 1 patch onto the skin daily as needed (for pain). Remove & Discard patch within 12 hours or as directed by MD.    . metolazone (ZAROXOLYN) 2.5 MG tablet TAKE 1 TABLET BY MOUTH TWICE PER WEEK SUNDAYS AND  THURSDAYS. 8 tablet 2  . Multiple Vitamin (MULTIVITAMIN) tablet Take 1 tablet by mouth daily.    . mupirocin ointment (BACTROBAN) 2 % Apply 1 application topically 2 (two) times daily as needed (ulcer on groin).    . potassium chloride SA (K-DUR,KLOR-CON) 20 MEQ tablet Take 40 meq in am and 20 meq in pm 90 tablet 6  . Probiotic Product (ALIGN PO) Take 1 tablet by mouth daily.    . sodium chloride (OCEAN) 0.65 % SOLN nasal spray Place 1 spray into both nostrils as needed for congestion.  0  . spironolactone (ALDACTONE) 25 MG tablet Take 1 tablet (25 mg total) by mouth daily.    . Tamsulosin HCl (FLOMAX) 0.4 MG CAPS Take 0.4 mg by mouth at bedtime.     . torsemide (DEMADEX)  20 MG tablet Take 60 mg in am 40 mg in pm 150 tablet 3  . vitamin B-12 1000 MCG tablet Take 1 tablet (1,000 mcg total) by mouth daily. 30 tablet 0   No current facility-administered medications for this encounter.    Filed Vitals:   08/30/14 1506  BP: 108/60  Weight: 347 lb 6.4 oz (157.58 kg)   Physical Exam:  General: Pleasant; Morbidly obese. Sitting in wheelchair.  No resp difficulty Wife present in wheelchair HEENT: normal except for blind in L eye  Neck: supple. JVP ~10  Difficulty to see d/t body habitus but appears 8; Carotids 2+ bilat; no bruits. No lymphadenopathy or thryomegaly appreciated.  Cor: PMI nonpalpable. Regular rate & rhythm. No rubs, gallops or murmurs.  Lungs: clear  Abdomen: morbidly obese, soft, nontender, mildly distended. . No bruits or masses. Good bowel sounds.  Extremities: no cyanosis, clubbing, rash, bilateral  LE edema L>R L2+ and R 1+, severe chronic venous stasis changes. Bilateral TED hose  Neuro: alert & orientedx3, cranial nerves grossly intact. moves all 4 extremities w/o difficulty. Affect pleasant     ASSESSMENT & PLAN:  1) Chronic diastolic HF: EF 88-89% (04/6943 - NYHA II-III. Volume status elevated in the setting of dietary noncompliance.  Continue current dose of torsemide and potassium. I have asked him to take an extra dose of metolazone today and continue twice a week.  - Reinforced the need and importance of daily weights, a low sodium diet, and fluid restriction (less than 2 L a day). Instructed to call the HF clinic if weight increases more than 3 lbs overnight or 5 lbs in a week. We continue to discuss each visit about his diet and the restrictions he needs to follow.  2) OSA - Continues to wear biPAP at night and O2 at night continue. 3) HTN - Stable. Continue current regimen.  4) CRI, stage III - Baseline Cr 1.5-2.0 - Will check BMET today  F/U 2 weeks   Maysoon Lozada, NP-C 3:05 PM

## 2014-09-01 DIAGNOSIS — I129 Hypertensive chronic kidney disease with stage 1 through stage 4 chronic kidney disease, or unspecified chronic kidney disease: Secondary | ICD-10-CM | POA: Diagnosis not present

## 2014-09-01 DIAGNOSIS — I5031 Acute diastolic (congestive) heart failure: Secondary | ICD-10-CM | POA: Diagnosis not present

## 2014-09-01 DIAGNOSIS — N183 Chronic kidney disease, stage 3 (moderate): Secondary | ICD-10-CM | POA: Diagnosis not present

## 2014-09-01 DIAGNOSIS — E119 Type 2 diabetes mellitus without complications: Secondary | ICD-10-CM | POA: Diagnosis not present

## 2014-09-01 DIAGNOSIS — I2510 Atherosclerotic heart disease of native coronary artery without angina pectoris: Secondary | ICD-10-CM | POA: Diagnosis not present

## 2014-09-01 DIAGNOSIS — S71102D Unspecified open wound, left thigh, subsequent encounter: Secondary | ICD-10-CM | POA: Diagnosis not present

## 2014-09-04 DIAGNOSIS — I5031 Acute diastolic (congestive) heart failure: Secondary | ICD-10-CM | POA: Diagnosis not present

## 2014-09-04 DIAGNOSIS — S71102D Unspecified open wound, left thigh, subsequent encounter: Secondary | ICD-10-CM | POA: Diagnosis not present

## 2014-09-04 DIAGNOSIS — N183 Chronic kidney disease, stage 3 (moderate): Secondary | ICD-10-CM | POA: Diagnosis not present

## 2014-09-04 DIAGNOSIS — I2510 Atherosclerotic heart disease of native coronary artery without angina pectoris: Secondary | ICD-10-CM | POA: Diagnosis not present

## 2014-09-04 DIAGNOSIS — I129 Hypertensive chronic kidney disease with stage 1 through stage 4 chronic kidney disease, or unspecified chronic kidney disease: Secondary | ICD-10-CM | POA: Diagnosis not present

## 2014-09-04 DIAGNOSIS — E119 Type 2 diabetes mellitus without complications: Secondary | ICD-10-CM | POA: Diagnosis not present

## 2014-09-05 ENCOUNTER — Telehealth (HOSPITAL_COMMUNITY): Payer: Self-pay | Admitting: Vascular Surgery

## 2014-09-05 DIAGNOSIS — I2510 Atherosclerotic heart disease of native coronary artery without angina pectoris: Secondary | ICD-10-CM | POA: Diagnosis not present

## 2014-09-05 DIAGNOSIS — E119 Type 2 diabetes mellitus without complications: Secondary | ICD-10-CM | POA: Diagnosis not present

## 2014-09-05 DIAGNOSIS — S71102D Unspecified open wound, left thigh, subsequent encounter: Secondary | ICD-10-CM | POA: Diagnosis not present

## 2014-09-05 DIAGNOSIS — I5031 Acute diastolic (congestive) heart failure: Secondary | ICD-10-CM | POA: Diagnosis not present

## 2014-09-05 DIAGNOSIS — I129 Hypertensive chronic kidney disease with stage 1 through stage 4 chronic kidney disease, or unspecified chronic kidney disease: Secondary | ICD-10-CM | POA: Diagnosis not present

## 2014-09-05 DIAGNOSIS — N183 Chronic kidney disease, stage 3 (moderate): Secondary | ICD-10-CM | POA: Diagnosis not present

## 2014-09-05 NOTE — Telephone Encounter (Signed)
Nurse from [facility] called she wants to know if Bensimhon would prescribe the pt some anxiety medication he become anxious at times.. Please advise

## 2014-09-05 NOTE — Telephone Encounter (Signed)
Left VM for Juliann Pulse to contact pt's pcp for meds

## 2014-09-07 ENCOUNTER — Telehealth (HOSPITAL_COMMUNITY): Payer: Self-pay | Admitting: Cardiology

## 2014-09-07 ENCOUNTER — Encounter: Payer: Self-pay | Admitting: *Deleted

## 2014-09-07 DIAGNOSIS — I129 Hypertensive chronic kidney disease with stage 1 through stage 4 chronic kidney disease, or unspecified chronic kidney disease: Secondary | ICD-10-CM | POA: Diagnosis not present

## 2014-09-07 DIAGNOSIS — S71102D Unspecified open wound, left thigh, subsequent encounter: Secondary | ICD-10-CM | POA: Diagnosis not present

## 2014-09-07 DIAGNOSIS — I5031 Acute diastolic (congestive) heart failure: Secondary | ICD-10-CM | POA: Diagnosis not present

## 2014-09-07 DIAGNOSIS — N183 Chronic kidney disease, stage 3 (moderate): Secondary | ICD-10-CM | POA: Diagnosis not present

## 2014-09-07 DIAGNOSIS — I2510 Atherosclerotic heart disease of native coronary artery without angina pectoris: Secondary | ICD-10-CM | POA: Diagnosis not present

## 2014-09-07 DIAGNOSIS — E119 Type 2 diabetes mellitus without complications: Secondary | ICD-10-CM | POA: Diagnosis not present

## 2014-09-07 MED ORDER — POTASSIUM CHLORIDE CRYS ER 20 MEQ PO TBCR: 40.0000 meq | EXTENDED_RELEASE_TABLET | Freq: Two times a day (BID) | ORAL | Status: AC

## 2014-09-07 NOTE — Telephone Encounter (Signed)
ABNORMAL LABS Labs drawn 09/05/14 with HH K- 3.4 Per Darrick Grinder, NP Pt should increase KcL to 40 meQ BID

## 2014-09-07 NOTE — Telephone Encounter (Signed)
Pt aware and voiced udnerstanding

## 2014-09-08 ENCOUNTER — Other Ambulatory Visit: Payer: Self-pay | Admitting: *Deleted

## 2014-09-08 ENCOUNTER — Encounter: Payer: Self-pay | Admitting: *Deleted

## 2014-09-08 DIAGNOSIS — N183 Chronic kidney disease, stage 3 (moderate): Secondary | ICD-10-CM | POA: Diagnosis not present

## 2014-09-08 DIAGNOSIS — S71102D Unspecified open wound, left thigh, subsequent encounter: Secondary | ICD-10-CM | POA: Diagnosis not present

## 2014-09-08 DIAGNOSIS — E119 Type 2 diabetes mellitus without complications: Secondary | ICD-10-CM | POA: Diagnosis not present

## 2014-09-08 DIAGNOSIS — I2510 Atherosclerotic heart disease of native coronary artery without angina pectoris: Secondary | ICD-10-CM | POA: Diagnosis not present

## 2014-09-08 DIAGNOSIS — I5031 Acute diastolic (congestive) heart failure: Secondary | ICD-10-CM | POA: Diagnosis not present

## 2014-09-08 DIAGNOSIS — I129 Hypertensive chronic kidney disease with stage 1 through stage 4 chronic kidney disease, or unspecified chronic kidney disease: Secondary | ICD-10-CM | POA: Diagnosis not present

## 2014-09-11 DIAGNOSIS — S71102D Unspecified open wound, left thigh, subsequent encounter: Secondary | ICD-10-CM | POA: Diagnosis not present

## 2014-09-11 DIAGNOSIS — N183 Chronic kidney disease, stage 3 (moderate): Secondary | ICD-10-CM | POA: Diagnosis not present

## 2014-09-11 DIAGNOSIS — I129 Hypertensive chronic kidney disease with stage 1 through stage 4 chronic kidney disease, or unspecified chronic kidney disease: Secondary | ICD-10-CM | POA: Diagnosis not present

## 2014-09-11 DIAGNOSIS — E119 Type 2 diabetes mellitus without complications: Secondary | ICD-10-CM | POA: Diagnosis not present

## 2014-09-11 DIAGNOSIS — I5031 Acute diastolic (congestive) heart failure: Secondary | ICD-10-CM | POA: Diagnosis not present

## 2014-09-11 DIAGNOSIS — I2510 Atherosclerotic heart disease of native coronary artery without angina pectoris: Secondary | ICD-10-CM | POA: Diagnosis not present

## 2014-09-12 ENCOUNTER — Encounter (HOSPITAL_COMMUNITY): Payer: Self-pay

## 2014-09-12 ENCOUNTER — Encounter (HOSPITAL_COMMUNITY): Payer: BC Managed Care – PPO

## 2014-09-12 ENCOUNTER — Ambulatory Visit (HOSPITAL_COMMUNITY)
Admission: RE | Admit: 2014-09-12 | Discharge: 2014-09-12 | Disposition: A | Discharge status: Home / Self Care | Payer: Medicare Other | Source: Ambulatory Visit | Attending: Cardiology | Admitting: Cardiology

## 2014-09-12 VITALS — BP 130/72 | HR 63 | Wt 340.0 lb

## 2014-09-12 DIAGNOSIS — G4733 Obstructive sleep apnea (adult) (pediatric): Secondary | ICD-10-CM | POA: Diagnosis not present

## 2014-09-12 DIAGNOSIS — I5032 Chronic diastolic (congestive) heart failure: Secondary | ICD-10-CM | POA: Diagnosis not present

## 2014-09-12 DIAGNOSIS — I2510 Atherosclerotic heart disease of native coronary artery without angina pectoris: Secondary | ICD-10-CM | POA: Insufficient documentation

## 2014-09-12 DIAGNOSIS — N183 Chronic kidney disease, stage 3 unspecified: Secondary | ICD-10-CM

## 2014-09-12 DIAGNOSIS — I129 Hypertensive chronic kidney disease with stage 1 through stage 4 chronic kidney disease, or unspecified chronic kidney disease: Secondary | ICD-10-CM | POA: Diagnosis not present

## 2014-09-12 DIAGNOSIS — E785 Hyperlipidemia, unspecified: Secondary | ICD-10-CM | POA: Insufficient documentation

## 2014-09-12 DIAGNOSIS — M797 Fibromyalgia: Secondary | ICD-10-CM | POA: Insufficient documentation

## 2014-09-12 NOTE — Progress Notes (Signed)
Patient ID: [name], male   DOB: 05-06-1931, 81 y.o.   MRN: [id number] Patient ID: [name], male   DOB: Sep 09, 1931, 81 y.o.   MRN: [id number]  Cardiologist: Dr Domenic Polite. PCP: Dr Gwyndolyn Saxon   HPI: [name] is a 64 year with a PMH of morbid obesity, chronic diastolic heart failure, HTN, SVT/PSVT/PAT, CAD, DMII, OSA-CPAP, arthritis, fibromyalgia, and hyperlipidemia.   Admitted 2/26 with HF decompensation.  He was diuresed with IV lasix and transitioned to Demadex 60 mg daily. He required short course on inpatient rehab. Discharge weight 370 pounds.   Readmitted April 20 with marked volume overload. Diuresed 27 pounds with IV lasix. Discharged on torsemide 60 mg in and 40 mg in pm. Discharge weight 341 pounds.    Follow up for Heart Failure: Overall feeing much better. Weight at home  trending down from 335 to 329 pounds. Breathing much better. Denies PND/orthopnea. Taking all medications. Uses wheelchair when he leaves the house. Doing better with what he is eating. Tries to folow low salt diet. He has cut back his fluid intake.  [facility] following as well as [facility]   Labs 09/11/2014: K 4.4 Creatinine 2.58   SH: Lives in [place] with wife. No ETOH or tobacco abuse  ROS: All systems negative except as listed in HPI, PMH and Problem List.  Past Medical History  Diagnosis Date  . PSVT (paroxysmal supraventricular tachycardia)     AVNRT  . Coronary atherosclerosis of native coronary artery     Nonobstructive, LVEF 65%  . Hyperlipidemia   . Morbid obesity   . Arthritis   . Type 2 diabetes mellitus   . Vertigo   . Venous insufficiency   . Sleep apnea, obstructive   . Chronic diastolic heart failure     Current Outpatient Prescriptions  Medication Sig Dispense Refill  . amLODipine (NORVASC) 10 MG tablet Take 1 tablet (10 mg total) by mouth daily. 30 tablet 6  . aspirin 81 MG chewable tablet Chew 2 tablets (162 mg total) by mouth daily.    . benzoyl peroxide 10 % gel Apply 1 application  topically 3 (three) times daily.    . calcium-vitamin D (OSCAL WITH D) 500-200 MG-UNIT per tablet Take 1 tablet by mouth 2 (two) times daily.    . cetirizine (ZYRTEC) 10 MG tablet Take 10 mg by mouth every evening.     . Cholecalciferol (D-3-5) 5000 UNITS capsule Take 5,000 Units by mouth daily.     . fluticasone (FLONASE) 50 MCG/ACT nasal spray Place 2 sprays into the nose daily.     Marland Kitchen HYDROcodone-acetaminophen (NORCO/VICODIN) 5-325 MG per tablet Take 1-2 tablets by mouth every 6 (six) hours as needed for moderate pain. (Patient taking differently: Take 1 tablet by mouth every 6 (six) hours as needed for moderate pain. ) 30 tablet 0  . insulin aspart (NOVOLOG) 100 UNIT/ML injection Inject into the skin 3 (three) times daily before meals. Sliding scale based on sugar level    . insulin detemir (LEVEMIR) 100 UNIT/ML injection Inject 0.15 mLs (15 Units total) into the skin at bedtime. (Patient taking differently: Inject 28 Units into the skin at bedtime. ) 10 mL 12  . labetalol (NORMODYNE) 200 MG tablet Take 1 tablet (200 mg total) by mouth 2 (two) times daily. 60 tablet 1  . latanoprost (XALATAN) 0.005 % ophthalmic solution Place 1 drop into the right eye at bedtime.     . lidocaine (LIDODERM) 5 % Place 1 patch onto the  skin daily as needed (for pain). Remove & Discard patch within 12 hours or as directed by MD.    . metolazone (ZAROXOLYN) 2.5 MG tablet TAKE 1 TABLET BY MOUTH TWICE PER WEEK SUNDAYS AND THURSDAYS. 8 tablet 2  . Multiple Vitamin (MULTIVITAMIN) tablet Take 1 tablet by mouth daily.    . mupirocin ointment (BACTROBAN) 2 % Apply 1 application topically 2 (two) times daily as needed (ulcer on groin).    . potassium chloride SA (K-DUR,KLOR-CON) 20 MEQ tablet Take 2 tablets (40 mEq total) by mouth 2 (two) times daily. 120 tablet 6  . Probiotic Product (ALIGN PO) Take 1 tablet by mouth daily.    . sodium chloride (OCEAN) 0.65 % SOLN nasal spray Place 1 spray into both nostrils as needed for  congestion.  0  . spironolactone (ALDACTONE) 25 MG tablet Take 1 tablet (25 mg total) by mouth daily.    . Tamsulosin HCl (FLOMAX) 0.4 MG CAPS Take 0.4 mg by mouth at bedtime.     . torsemide (DEMADEX) 20 MG tablet Take 60 mg in am 40 mg in pm 150 tablet 3  . vitamin B-12 1000 MCG tablet Take 1 tablet (1,000 mcg total) by mouth daily. 30 tablet 0   No current facility-administered medications for this encounter.    Filed Vitals:   09/12/14 1444  BP: 130/72  Pulse: 63  Weight: 340 lb (154.223 kg)  SpO2: 99%   Physical Exam:  General: Pleasant; Morbidly obese. Sitting in wheelchair.  No resp difficulty Wife present in wheelchair HEENT: normal except for blind in L eye  Neck: supple. JVP 5-6 Difficulty to see d/t body habitus but appears 8; Carotids 2+ bilat; no bruits. No lymphadenopathy or thryomegaly appreciated.  Cor: PMI nonpalpable. Regular rate & rhythm. No rubs, gallops or murmurs.  Lungs: clear  Abdomen: morbidly obese, soft, nontender, mildly distended. . No bruits or masses. Good bowel sounds.  Extremities: no cyanosis, clubbing, rash, bilateral  LE edema L>R L 2+ and R trace- 1+, severe chronic venous stasis changes. Bilateral TED hose  Neuro: alert & orientedx3, cranial nerves grossly intact. moves all 4 extremities w/o difficulty. Affect pleasant     ASSESSMENT & PLAN:  1) Chronic diastolic HF: EF 04-88% (11/9167 - NYHA II-III. Volume status improved from last visit.  Continue current diuretic regimen with torsemide + metolazone. - Reinforced the need and importance of daily weights, a low sodium diet, and fluid restriction (less than 2 L a day). Instructed to call the HF clinic if weight increases more than 3 lbs overnight or 5 lbs in a week. We continue to discuss each visit about his diet and the restrictions he needs to follow.  2) OSA - Continues to wear biPAP at night and O2 at night continue. 3) HTN  - Stable. Continue current regimen.  4) CRI, stage III -  reviewed BMEt from 09/11/2014   F/U  4 weeks.    Kamin Niblack, NP-C 3:08 PM

## 2014-09-12 NOTE — Patient Instructions (Signed)
FOLLOW UP in 4 weeks.

## 2014-09-13 DIAGNOSIS — S71102D Unspecified open wound, left thigh, subsequent encounter: Secondary | ICD-10-CM | POA: Diagnosis not present

## 2014-09-13 DIAGNOSIS — E119 Type 2 diabetes mellitus without complications: Secondary | ICD-10-CM | POA: Diagnosis not present

## 2014-09-13 DIAGNOSIS — I129 Hypertensive chronic kidney disease with stage 1 through stage 4 chronic kidney disease, or unspecified chronic kidney disease: Secondary | ICD-10-CM | POA: Diagnosis not present

## 2014-09-13 DIAGNOSIS — I2510 Atherosclerotic heart disease of native coronary artery without angina pectoris: Secondary | ICD-10-CM | POA: Diagnosis not present

## 2014-09-13 DIAGNOSIS — N183 Chronic kidney disease, stage 3 (moderate): Secondary | ICD-10-CM | POA: Diagnosis not present

## 2014-09-13 DIAGNOSIS — I5031 Acute diastolic (congestive) heart failure: Secondary | ICD-10-CM | POA: Diagnosis not present

## 2014-09-14 DIAGNOSIS — T1490 Injury, unspecified: Secondary | ICD-10-CM | POA: Diagnosis not present

## 2014-09-15 DIAGNOSIS — I2510 Atherosclerotic heart disease of native coronary artery without angina pectoris: Secondary | ICD-10-CM | POA: Diagnosis not present

## 2014-09-15 DIAGNOSIS — S71102D Unspecified open wound, left thigh, subsequent encounter: Secondary | ICD-10-CM | POA: Diagnosis not present

## 2014-09-15 DIAGNOSIS — I5031 Acute diastolic (congestive) heart failure: Secondary | ICD-10-CM | POA: Diagnosis not present

## 2014-09-15 DIAGNOSIS — E119 Type 2 diabetes mellitus without complications: Secondary | ICD-10-CM | POA: Diagnosis not present

## 2014-09-15 DIAGNOSIS — N183 Chronic kidney disease, stage 3 (moderate): Secondary | ICD-10-CM | POA: Diagnosis not present

## 2014-09-15 DIAGNOSIS — I129 Hypertensive chronic kidney disease with stage 1 through stage 4 chronic kidney disease, or unspecified chronic kidney disease: Secondary | ICD-10-CM | POA: Diagnosis not present

## 2014-09-18 DIAGNOSIS — I2510 Atherosclerotic heart disease of native coronary artery without angina pectoris: Secondary | ICD-10-CM | POA: Diagnosis not present

## 2014-09-18 DIAGNOSIS — S71102D Unspecified open wound, left thigh, subsequent encounter: Secondary | ICD-10-CM | POA: Diagnosis not present

## 2014-09-18 DIAGNOSIS — N183 Chronic kidney disease, stage 3 (moderate): Secondary | ICD-10-CM | POA: Diagnosis not present

## 2014-09-18 DIAGNOSIS — I501 Left ventricular failure: Secondary | ICD-10-CM | POA: Diagnosis not present

## 2014-09-18 DIAGNOSIS — I129 Hypertensive chronic kidney disease with stage 1 through stage 4 chronic kidney disease, or unspecified chronic kidney disease: Secondary | ICD-10-CM | POA: Diagnosis not present

## 2014-09-18 DIAGNOSIS — I5033 Acute on chronic diastolic (congestive) heart failure: Secondary | ICD-10-CM | POA: Diagnosis not present

## 2014-09-18 DIAGNOSIS — E119 Type 2 diabetes mellitus without complications: Secondary | ICD-10-CM | POA: Diagnosis not present

## 2014-09-18 DIAGNOSIS — I5031 Acute diastolic (congestive) heart failure: Secondary | ICD-10-CM | POA: Diagnosis not present

## 2014-09-19 ENCOUNTER — Telehealth (HOSPITAL_COMMUNITY): Payer: Self-pay | Admitting: Vascular Surgery

## 2014-09-19 DIAGNOSIS — N183 Chronic kidney disease, stage 3 (moderate): Secondary | ICD-10-CM | POA: Diagnosis not present

## 2014-09-19 DIAGNOSIS — I129 Hypertensive chronic kidney disease with stage 1 through stage 4 chronic kidney disease, or unspecified chronic kidney disease: Secondary | ICD-10-CM | POA: Diagnosis not present

## 2014-09-19 DIAGNOSIS — I2510 Atherosclerotic heart disease of native coronary artery without angina pectoris: Secondary | ICD-10-CM | POA: Diagnosis not present

## 2014-09-19 DIAGNOSIS — E119 Type 2 diabetes mellitus without complications: Secondary | ICD-10-CM | POA: Diagnosis not present

## 2014-09-19 DIAGNOSIS — I5031 Acute diastolic (congestive) heart failure: Secondary | ICD-10-CM | POA: Diagnosis not present

## 2014-09-19 DIAGNOSIS — S71102D Unspecified open wound, left thigh, subsequent encounter: Secondary | ICD-10-CM | POA: Diagnosis not present

## 2014-09-19 NOTE — Telephone Encounter (Signed)
VERBAL ORDERS GIVEN TO CONTINUE SERVICES

## 2014-09-19 NOTE — Telephone Encounter (Signed)
[facility] called she would like a recert order

## 2014-09-21 DIAGNOSIS — E119 Type 2 diabetes mellitus without complications: Secondary | ICD-10-CM | POA: Diagnosis not present

## 2014-09-21 DIAGNOSIS — N183 Chronic kidney disease, stage 3 (moderate): Secondary | ICD-10-CM | POA: Diagnosis not present

## 2014-09-21 DIAGNOSIS — I129 Hypertensive chronic kidney disease with stage 1 through stage 4 chronic kidney disease, or unspecified chronic kidney disease: Secondary | ICD-10-CM | POA: Diagnosis not present

## 2014-09-21 DIAGNOSIS — I5031 Acute diastolic (congestive) heart failure: Secondary | ICD-10-CM | POA: Diagnosis not present

## 2014-09-21 DIAGNOSIS — I2510 Atherosclerotic heart disease of native coronary artery without angina pectoris: Secondary | ICD-10-CM | POA: Diagnosis not present

## 2014-09-21 DIAGNOSIS — S71102D Unspecified open wound, left thigh, subsequent encounter: Secondary | ICD-10-CM | POA: Diagnosis not present

## 2014-09-22 ENCOUNTER — Other Ambulatory Visit: Payer: Self-pay | Admitting: *Deleted

## 2014-09-23 DIAGNOSIS — T1490 Injury, unspecified: Secondary | ICD-10-CM | POA: Diagnosis not present

## 2014-09-26 DIAGNOSIS — I129 Hypertensive chronic kidney disease with stage 1 through stage 4 chronic kidney disease, or unspecified chronic kidney disease: Secondary | ICD-10-CM | POA: Diagnosis not present

## 2014-09-26 DIAGNOSIS — S71102D Unspecified open wound, left thigh, subsequent encounter: Secondary | ICD-10-CM | POA: Diagnosis not present

## 2014-09-26 DIAGNOSIS — I2510 Atherosclerotic heart disease of native coronary artery without angina pectoris: Secondary | ICD-10-CM | POA: Diagnosis not present

## 2014-09-26 DIAGNOSIS — N183 Chronic kidney disease, stage 3 (moderate): Secondary | ICD-10-CM | POA: Diagnosis not present

## 2014-09-26 DIAGNOSIS — I5031 Acute diastolic (congestive) heart failure: Secondary | ICD-10-CM | POA: Diagnosis not present

## 2014-09-26 DIAGNOSIS — E119 Type 2 diabetes mellitus without complications: Secondary | ICD-10-CM | POA: Diagnosis not present

## 2014-09-27 DIAGNOSIS — S71102D Unspecified open wound, left thigh, subsequent encounter: Secondary | ICD-10-CM | POA: Diagnosis not present

## 2014-09-27 DIAGNOSIS — N183 Chronic kidney disease, stage 3 (moderate): Secondary | ICD-10-CM | POA: Diagnosis not present

## 2014-09-27 DIAGNOSIS — I2510 Atherosclerotic heart disease of native coronary artery without angina pectoris: Secondary | ICD-10-CM | POA: Diagnosis not present

## 2014-09-27 DIAGNOSIS — I129 Hypertensive chronic kidney disease with stage 1 through stage 4 chronic kidney disease, or unspecified chronic kidney disease: Secondary | ICD-10-CM | POA: Diagnosis not present

## 2014-09-27 DIAGNOSIS — I5031 Acute diastolic (congestive) heart failure: Secondary | ICD-10-CM | POA: Diagnosis not present

## 2014-09-27 DIAGNOSIS — E119 Type 2 diabetes mellitus without complications: Secondary | ICD-10-CM | POA: Diagnosis not present

## 2014-09-28 DIAGNOSIS — L97909 Non-pressure chronic ulcer of unspecified part of unspecified lower leg with unspecified severity: Secondary | ICD-10-CM | POA: Diagnosis not present

## 2014-09-29 ENCOUNTER — Encounter: Payer: Self-pay | Admitting: Internal Medicine

## 2014-09-29 DIAGNOSIS — S71102D Unspecified open wound, left thigh, subsequent encounter: Secondary | ICD-10-CM | POA: Diagnosis not present

## 2014-09-29 DIAGNOSIS — I129 Hypertensive chronic kidney disease with stage 1 through stage 4 chronic kidney disease, or unspecified chronic kidney disease: Secondary | ICD-10-CM | POA: Diagnosis not present

## 2014-09-29 DIAGNOSIS — I5031 Acute diastolic (congestive) heart failure: Secondary | ICD-10-CM | POA: Diagnosis not present

## 2014-09-29 DIAGNOSIS — N183 Chronic kidney disease, stage 3 (moderate): Secondary | ICD-10-CM | POA: Diagnosis not present

## 2014-09-29 DIAGNOSIS — E119 Type 2 diabetes mellitus without complications: Secondary | ICD-10-CM | POA: Diagnosis not present

## 2014-09-29 DIAGNOSIS — I2510 Atherosclerotic heart disease of native coronary artery without angina pectoris: Secondary | ICD-10-CM | POA: Diagnosis not present

## 2014-10-02 ENCOUNTER — Encounter: Payer: Self-pay | Admitting: Internal Medicine

## 2014-10-02 DIAGNOSIS — N183 Chronic kidney disease, stage 3 (moderate): Secondary | ICD-10-CM | POA: Diagnosis not present

## 2014-10-02 DIAGNOSIS — I2510 Atherosclerotic heart disease of native coronary artery without angina pectoris: Secondary | ICD-10-CM | POA: Diagnosis not present

## 2014-10-02 DIAGNOSIS — I5031 Acute diastolic (congestive) heart failure: Secondary | ICD-10-CM | POA: Diagnosis not present

## 2014-10-02 DIAGNOSIS — I129 Hypertensive chronic kidney disease with stage 1 through stage 4 chronic kidney disease, or unspecified chronic kidney disease: Secondary | ICD-10-CM | POA: Diagnosis not present

## 2014-10-02 DIAGNOSIS — E119 Type 2 diabetes mellitus without complications: Secondary | ICD-10-CM | POA: Diagnosis not present

## 2014-10-02 DIAGNOSIS — S71102D Unspecified open wound, left thigh, subsequent encounter: Secondary | ICD-10-CM | POA: Diagnosis not present

## 2014-10-05 DIAGNOSIS — S71102D Unspecified open wound, left thigh, subsequent encounter: Secondary | ICD-10-CM | POA: Diagnosis not present

## 2014-10-05 DIAGNOSIS — I2510 Atherosclerotic heart disease of native coronary artery without angina pectoris: Secondary | ICD-10-CM | POA: Diagnosis not present

## 2014-10-05 DIAGNOSIS — N183 Chronic kidney disease, stage 3 (moderate): Secondary | ICD-10-CM | POA: Diagnosis not present

## 2014-10-05 DIAGNOSIS — E119 Type 2 diabetes mellitus without complications: Secondary | ICD-10-CM | POA: Diagnosis not present

## 2014-10-05 DIAGNOSIS — I5031 Acute diastolic (congestive) heart failure: Secondary | ICD-10-CM | POA: Diagnosis not present

## 2014-10-05 DIAGNOSIS — I129 Hypertensive chronic kidney disease with stage 1 through stage 4 chronic kidney disease, or unspecified chronic kidney disease: Secondary | ICD-10-CM | POA: Diagnosis not present

## 2014-10-06 ENCOUNTER — Other Ambulatory Visit: Payer: Self-pay | Admitting: *Deleted

## 2014-10-06 NOTE — Patient Outreach (Signed)
[facility]) Care Management  10/06/2014  [name] 07-25-1931 [id number]   I spoke with [name] by phone today to follow up on chronic disease management. He told me that his home health nurse has helped him get in touch with the heart failure clinic to discuss his weight and medication management. Weight today is 339# which is up from a few weeks ago but improving over the last few days. He denies worsening shortness of breath, chest pain, or any other new symptom. He has an appointment at the [facility] next week on Tuesday.   In addition, [name]'s home health nurse helped him get in touch with Dr. Denna Haggard as he is still having difficulty with management of a wound in the groin area. Dr. Denna Haggard saw [name] in the office and prescribed an ointment and wound regimen which [name]'s home health nurse is helping him carry out.   I scheduled a face to face visit with [name] for 10/19/14 at 1pm at his home in [place] and will follow up on notes from clinic and provider visits prior to our appointment.    [facility]  [phone]

## 2014-10-09 DIAGNOSIS — I2510 Atherosclerotic heart disease of native coronary artery without angina pectoris: Secondary | ICD-10-CM | POA: Diagnosis not present

## 2014-10-09 DIAGNOSIS — I129 Hypertensive chronic kidney disease with stage 1 through stage 4 chronic kidney disease, or unspecified chronic kidney disease: Secondary | ICD-10-CM | POA: Diagnosis not present

## 2014-10-09 DIAGNOSIS — N183 Chronic kidney disease, stage 3 (moderate): Secondary | ICD-10-CM | POA: Diagnosis not present

## 2014-10-09 DIAGNOSIS — I5031 Acute diastolic (congestive) heart failure: Secondary | ICD-10-CM | POA: Diagnosis not present

## 2014-10-09 DIAGNOSIS — S71102D Unspecified open wound, left thigh, subsequent encounter: Secondary | ICD-10-CM | POA: Diagnosis not present

## 2014-10-09 DIAGNOSIS — E119 Type 2 diabetes mellitus without complications: Secondary | ICD-10-CM | POA: Diagnosis not present

## 2014-10-10 ENCOUNTER — Ambulatory Visit (HOSPITAL_COMMUNITY)
Admission: RE | Admit: 2014-10-10 | Discharge: 2014-10-10 | Disposition: A | Discharge status: Home / Self Care | Payer: Medicare Other | Source: Ambulatory Visit | Attending: Internal Medicine | Admitting: Internal Medicine

## 2014-10-10 VITALS — BP 136/66 | HR 62 | Wt 340.5 lb

## 2014-10-10 DIAGNOSIS — M797 Fibromyalgia: Secondary | ICD-10-CM | POA: Diagnosis not present

## 2014-10-10 DIAGNOSIS — I5032 Chronic diastolic (congestive) heart failure: Secondary | ICD-10-CM

## 2014-10-10 DIAGNOSIS — Z794 Long term (current) use of insulin: Secondary | ICD-10-CM | POA: Diagnosis not present

## 2014-10-10 DIAGNOSIS — G4730 Sleep apnea, unspecified: Secondary | ICD-10-CM

## 2014-10-10 DIAGNOSIS — Z7982 Long term (current) use of aspirin: Secondary | ICD-10-CM | POA: Diagnosis not present

## 2014-10-10 DIAGNOSIS — N183 Chronic kidney disease, stage 3 unspecified: Secondary | ICD-10-CM

## 2014-10-10 DIAGNOSIS — E785 Hyperlipidemia, unspecified: Secondary | ICD-10-CM | POA: Diagnosis not present

## 2014-10-10 DIAGNOSIS — I10 Essential (primary) hypertension: Secondary | ICD-10-CM | POA: Diagnosis not present

## 2014-10-10 DIAGNOSIS — I2510 Atherosclerotic heart disease of native coronary artery without angina pectoris: Secondary | ICD-10-CM | POA: Diagnosis not present

## 2014-10-10 DIAGNOSIS — M1990 Unspecified osteoarthritis, unspecified site: Secondary | ICD-10-CM | POA: Diagnosis not present

## 2014-10-10 DIAGNOSIS — Z79899 Other long term (current) drug therapy: Secondary | ICD-10-CM | POA: Insufficient documentation

## 2014-10-10 DIAGNOSIS — I129 Hypertensive chronic kidney disease with stage 1 through stage 4 chronic kidney disease, or unspecified chronic kidney disease: Secondary | ICD-10-CM | POA: Insufficient documentation

## 2014-10-10 DIAGNOSIS — E1122 Type 2 diabetes mellitus with diabetic chronic kidney disease: Secondary | ICD-10-CM | POA: Insufficient documentation

## 2014-10-10 DIAGNOSIS — I872 Venous insufficiency (chronic) (peripheral): Secondary | ICD-10-CM | POA: Insufficient documentation

## 2014-10-10 DIAGNOSIS — G4733 Obstructive sleep apnea (adult) (pediatric): Secondary | ICD-10-CM | POA: Diagnosis not present

## 2014-10-10 NOTE — Progress Notes (Signed)
Patient ID: [name], male   DOB: [birth year], 81 y.o.   MRN: [id number]  Cardiologist: Dr Domenic Polite. PCP: Dr Gwyndolyn Saxon   HPI: [name] is a 29 year with a PMH of morbid obesity, chronic diastolic heart failure, HTN, SVT/PSVT/PAT, CAD, DMII, OSA-CPAP, arthritis, fibromyalgia, and hyperlipidemia.   Admitted 2/26 with HF decompensation.  He was diuresed with IV lasix and transitioned to Demadex 60 mg daily. He required short course on inpatient rehab. Discharge weight 370 pounds.   Readmitted April 20 with marked volume overload. Diuresed 27 pounds with IV lasix. Discharged on torsemide 60 mg in and 40 mg in pm. Discharge weight 341 pounds.    Follow up for Heart Failure: Overall feeing much better. Weight at home 334 pounds. Remains SOB with exertion.  Denies PND/orthopnea. Taking all medications but only taking torsemide 40 mg twice a day and 40 meq K in am and 20 meq in pm.  Able to walk more around the house. Tries to folow low salt diet. Says he had cut back fluid intake to a little over 2 liters. [facility] following.  Followed by [facility] . Followed by Dr Denna Haggard for groin wound.   Labs 09/11/2014: K 4.0 Creatinine 2.67    SH: Lives in [place] with wife. No ETOH or tobacco abuse  ROS: All systems negative except as listed in HPI, PMH and Problem List.  Past Medical History  Diagnosis Date  . PSVT (paroxysmal supraventricular tachycardia)     AVNRT  . Coronary atherosclerosis of native coronary artery     Nonobstructive, LVEF 65%  . Hyperlipidemia   . Morbid obesity   . Arthritis   . Type 2 diabetes mellitus   . Vertigo   . Venous insufficiency   . Sleep apnea, obstructive   . Chronic diastolic heart failure     Current Outpatient Prescriptions  Medication Sig Dispense Refill  . amLODipine (NORVASC) 10 MG tablet Take 1 tablet (10 mg total) by mouth daily. 30 tablet 6  . aspirin 81 MG chewable tablet Chew 2 tablets (162 mg total) by mouth daily.    . benzoyl peroxide 10 % gel Apply 1  application topically 3 (three) times daily.    . calcium-vitamin D (OSCAL WITH D) 500-200 MG-UNIT per tablet Take 1 tablet by mouth 2 (two) times daily.    . cetirizine (ZYRTEC) 10 MG tablet Take 10 mg by mouth every evening.     . Cholecalciferol (D-3-5) 5000 UNITS capsule Take 5,000 Units by mouth daily.     . fluticasone (FLONASE) 50 MCG/ACT nasal spray Place 2 sprays into the nose daily.     Marland Kitchen HYDROcodone-acetaminophen (NORCO/VICODIN) 5-325 MG per tablet Take 1-2 tablets by mouth every 6 (six) hours as needed for moderate pain. (Patient taking differently: Take 1 tablet by mouth every 6 (six) hours as needed for moderate pain. ) 30 tablet 0  . insulin aspart (NOVOLOG) 100 UNIT/ML injection Inject into the skin 3 (three) times daily before meals. Sliding scale based on sugar level    . insulin detemir (LEVEMIR) 100 UNIT/ML injection Inject 0.15 mLs (15 Units total) into the skin at bedtime. (Patient taking differently: Inject 28 Units into the skin at bedtime. ) 10 mL 12  . labetalol (NORMODYNE) 200 MG tablet Take 1 tablet (200 mg total) by mouth 2 (two) times daily. 60 tablet 1  . latanoprost (XALATAN) 0.005 % ophthalmic solution Place 1 drop into the right eye at bedtime.     . lidocaine (  LIDODERM) 5 % Place 1 patch onto the skin daily as needed (for pain). Remove & Discard patch within 12 hours or as directed by MD.    . metolazone (ZAROXOLYN) 2.5 MG tablet TAKE 1 TABLET BY MOUTH TWICE PER WEEK SUNDAYS AND THURSDAYS. 8 tablet 2  . Multiple Vitamin (MULTIVITAMIN) tablet Take 1 tablet by mouth daily.    . mupirocin ointment (BACTROBAN) 2 % Apply 1 application topically 2 (two) times daily as needed (ulcer on groin).    . potassium chloride SA (K-DUR,KLOR-CON) 20 MEQ tablet Take 2 tablets (40 mEq total) by mouth 2 (two) times daily. 120 tablet 6  . Probiotic Product (ALIGN PO) Take 1 tablet by mouth daily.    . sodium chloride (OCEAN) 0.65 % SOLN nasal spray Place 1 spray into both nostrils as  needed for congestion.  0  . spironolactone (ALDACTONE) 25 MG tablet Take 1 tablet (25 mg total) by mouth daily.    . Tamsulosin HCl (FLOMAX) 0.4 MG CAPS Take 0.4 mg by mouth at bedtime.     . torsemide (DEMADEX) 20 MG tablet Take 60 mg in am 40 mg in pm 150 tablet 3  . vitamin B-12 1000 MCG tablet Take 1 tablet (1,000 mcg total) by mouth daily. 30 tablet 0   No current facility-administered medications for this encounter.    Filed Vitals:   10/10/14 1357  BP: 136/66  Pulse: 62  Weight: 340 lb 8 oz (154.45 kg)  SpO2: 96%   Physical Exam:  General: Pleasant; Morbidly obese. Sitting in wheelchair.  No resp difficulty Wife present in wheelchair HEENT: normal except for blind in L eye  Neck: supple. JVP ~10 Difficulty to see d/t body habitus but appears 8; Carotids 2+ bilat; no bruits. No lymphadenopathy or thryomegaly appreciated.  Cor: PMI nonpalpable. Regular rate & rhythm. No rubs, gallops or murmurs.  Lungs: clear  Abdomen: morbidly obese, soft, nontender, mildly distended. . No bruits or masses. Good bowel sounds.  Extremities: no cyanosis, clubbing, rash, bilateral  LE edema L>R L 1+ and R trace, severe chronic venous stasis changes. Bilateral TED hose  Neuro: alert & orientedx3, cranial nerves grossly intact. moves all 4 extremities w/o difficulty. Affect pleasant     ASSESSMENT & PLAN:  1) Chronic diastolic HF: EF 87-56% (07/3327 - NYHA II-III. Volume stated elevated but says he has only been taking 40 mg  Torsemide twice a day. Today I have asked him to resume torsemide 60 mg in am and 40 mg in pm with 40 meq of potassium twice a day.  Continue metolazone twice a week.  - Reinforced the need and importance of daily weights, a low sodium diet, and fluid restriction (less than 2 L a day). Instructed to call the HF clinic if weight increases more than 3 lbs overnight or 5 lbs in a week. We continue to discuss each visit about his diet and the restrictions he needs to follow.  2)  OSA - Continues to wear biPAP at night and O2 at night continue. 3) HTN  - Stable. Continue current regimen.  4) CRI, stage III 5) Morbid Obesity- needs to lose weight. Encouraged to cut back portions.     F/U  6 weeks.    Demetries Coia, NP-C 2:02 PM

## 2014-10-10 NOTE — Patient Instructions (Signed)
Increase Potassium to 40 meq (2 tabs) Twice daily   Increase Torsemide to 60 mg (3 tabs) in AM and 40 mg (2 tabs) in PM  Your physician recommends that you schedule a follow-up appointment in: 2 months

## 2014-10-11 ENCOUNTER — Encounter: Payer: Self-pay | Admitting: Internal Medicine

## 2014-10-12 ENCOUNTER — Telehealth: Payer: Self-pay | Admitting: Neurology

## 2014-10-12 NOTE — Telephone Encounter (Signed)
Patient daughter called and stated that his CPAP machine broke. She spoke [facility] and they stated that they need Dr. Brett Fairy to fax over a prescription for the CPAP. Please call and advise. (Fax [facility] [phone]).

## 2014-10-12 NOTE — Telephone Encounter (Signed)
Returned pt's phone call, spoke to pt. He said he thinks he fixed the cpap machine, a wire was loose. I told pt that we needed to schedule him an appt with Dr. Brett Fairy if he wants [place] to write a new prescription for a new machine. He said that he thinks his cpap will work now, but if it doesn't, he will call me in a few days to make an appt.

## 2014-10-16 ENCOUNTER — Other Ambulatory Visit (HOSPITAL_COMMUNITY): Payer: Self-pay | Admitting: Internal Medicine

## 2014-10-18 ENCOUNTER — Telehealth (HOSPITAL_COMMUNITY): Payer: Self-pay | Admitting: *Deleted

## 2014-10-18 DIAGNOSIS — I2510 Atherosclerotic heart disease of native coronary artery without angina pectoris: Secondary | ICD-10-CM | POA: Diagnosis not present

## 2014-10-18 DIAGNOSIS — S71102D Unspecified open wound, left thigh, subsequent encounter: Secondary | ICD-10-CM | POA: Diagnosis not present

## 2014-10-18 DIAGNOSIS — E119 Type 2 diabetes mellitus without complications: Secondary | ICD-10-CM | POA: Diagnosis not present

## 2014-10-18 DIAGNOSIS — N183 Chronic kidney disease, stage 3 (moderate): Secondary | ICD-10-CM | POA: Diagnosis not present

## 2014-10-18 DIAGNOSIS — I129 Hypertensive chronic kidney disease with stage 1 through stage 4 chronic kidney disease, or unspecified chronic kidney disease: Secondary | ICD-10-CM | POA: Diagnosis not present

## 2014-10-18 DIAGNOSIS — I5031 Acute diastolic (congestive) heart failure: Secondary | ICD-10-CM | POA: Diagnosis not present

## 2014-10-18 NOTE — Telephone Encounter (Signed)
[place] RN left vm requesting a callback about discharging pt from [facility]. Called back ... No answer.. Left VM. [phone]

## 2014-10-19 ENCOUNTER — Ambulatory Visit: Payer: Medicare Other | Admitting: *Deleted

## 2014-10-20 ENCOUNTER — Encounter: Payer: Self-pay | Admitting: Internal Medicine

## 2014-10-20 DIAGNOSIS — S71102D Unspecified open wound, left thigh, subsequent encounter: Secondary | ICD-10-CM | POA: Diagnosis not present

## 2014-10-20 DIAGNOSIS — I2510 Atherosclerotic heart disease of native coronary artery without angina pectoris: Secondary | ICD-10-CM | POA: Diagnosis not present

## 2014-10-20 DIAGNOSIS — E119 Type 2 diabetes mellitus without complications: Secondary | ICD-10-CM | POA: Diagnosis not present

## 2014-10-20 DIAGNOSIS — N183 Chronic kidney disease, stage 3 (moderate): Secondary | ICD-10-CM | POA: Diagnosis not present

## 2014-10-20 DIAGNOSIS — I5031 Acute diastolic (congestive) heart failure: Secondary | ICD-10-CM | POA: Diagnosis not present

## 2014-10-20 DIAGNOSIS — I129 Hypertensive chronic kidney disease with stage 1 through stage 4 chronic kidney disease, or unspecified chronic kidney disease: Secondary | ICD-10-CM | POA: Diagnosis not present

## 2014-10-24 ENCOUNTER — Other Ambulatory Visit: Payer: Self-pay | Admitting: *Deleted

## 2014-10-24 NOTE — Patient Outreach (Signed)
[facility]) Care Management   10/24/2014  Ra Pfiester Nakagawa June 18, 1931 [id number]  MALOSI HEMSTREET is an 81 y.o. male  Subjective: Mr Mansouri is a 86 year with a PMH of morbid obesity, chronic diastolic heart failure, HTN, SVT/PSVT/PAT, CAD, DMII, OSA-CPAP, arthritis, fibromyalgia, and hyperlipidemia. Mr. Marschall lives at home with his wife in [place]. He has a ramp, accessible van, accessible bathroom, and strong family support.   [facility] Care Management was consulted to assist with management of CHF after an admission for acute on chronic HF in April. Mr. Redner is being managed at the [facility] and has done fairly well with regard to Heart Failure management since this last admission.   Mr. Ringenberg's diabetic management is a concern in general because he is not at target for HgA1C, because of renal insufficiency, but also because he has a non-healing wound.   Objective:  BP 122/72 mmHg  Pulse 60  Ht 1.829 m (6')  Wt 332 lb (150.594 kg)  BMI 45.02 kg/m2  SpO2 95%  Review of Systems  Constitutional: Negative.   HENT: Negative.   Eyes: Negative.   Respiratory: Negative.   Cardiovascular: Negative.   Gastrointestinal: Negative.   Genitourinary: Negative.   Musculoskeletal: Negative.  Negative for falls.  Neurological: Negative.   Endo/Heme/Allergies: Negative.   Psychiatric/Behavioral: Negative.     Physical Exam  Constitutional: He is oriented to person, place, and time. Vital signs are normal. He appears well-developed and well-nourished. He is active.  Cardiovascular: Normal rate.  An irregular rhythm present.  Murmur heard.  Systolic murmur is present with a grade of 2/6  Respiratory: Effort normal. He has decreased breath sounds in the right lower field and the left lower field.  GI: Soft. Bowel sounds are normal.  Neurological: He is alert and oriented to person, place, and time.  Skin: Skin is warm and dry.     Psychiatric: He has a normal mood and  affect. His speech is normal and behavior is normal. Judgment and thought content normal. Cognition and memory are normal.    Current Medications:   Current Outpatient Prescriptions  Medication Sig Dispense Refill  . silver nitrate applicators 62-13 % applicator Apply 1 application topically 2 (two) times daily.    Marland Kitchen amLODipine (NORVASC) 10 MG tablet TAKE ONE TABLET BY MOUTH ONCE DAILY. 30 tablet 6  . aspirin 81 MG chewable tablet Chew 2 tablets (162 mg total) by mouth daily.    . benzoyl peroxide 10 % gel Apply 1 application topically 3 (three) times daily.    . calcium-vitamin D (OSCAL WITH D) 500-200 MG-UNIT per tablet Take 1 tablet by mouth 2 (two) times daily.    . cetirizine (ZYRTEC) 10 MG tablet Take 10 mg by mouth every evening.     . Cholecalciferol (D-3-5) 5000 UNITS capsule Take 5,000 Units by mouth daily.     . fluticasone (FLONASE) 50 MCG/ACT nasal spray Place 2 sprays into the nose daily.     Marland Kitchen HYDROcodone-acetaminophen (NORCO/VICODIN) 5-325 MG per tablet Take 1-2 tablets by mouth every 6 (six) hours as needed for moderate pain. (Patient taking differently: Take 1 tablet by mouth every 6 (six) hours as needed for moderate pain. ) 30 tablet 0  . insulin aspart (NOVOLOG) 100 UNIT/ML injection Inject into the skin 3 (three) times daily before meals. Sliding scale based on sugar level    . insulin detemir (LEVEMIR) 100 UNIT/ML injection Inject 0.15 mLs (15 Units total) into the skin  at bedtime. (Patient taking differently: Inject 28 Units into the skin at bedtime. ) 10 mL 12  . labetalol (NORMODYNE) 200 MG tablet Take 1 tablet (200 mg total) by mouth 2 (two) times daily. 60 tablet 1  . latanoprost (XALATAN) 0.005 % ophthalmic solution Place 1 drop into the right eye at bedtime.     . lidocaine (LIDODERM) 5 % Place 1 patch onto the skin daily as needed (for pain). Remove & Discard patch within 12 hours or as directed by MD.    . metolazone (ZAROXOLYN) 2.5 MG tablet TAKE 1 TABLET BY MOUTH  TWICE PER WEEK SUNDAYS AND THURSDAYS. 8 tablet 2  . Multiple Vitamin (MULTIVITAMIN) tablet Take 1 tablet by mouth daily.    . mupirocin ointment (BACTROBAN) 2 % Apply 1 application topically 2 (two) times daily as needed (ulcer on groin).    . potassium chloride SA (K-DUR,KLOR-CON) 20 MEQ tablet Take 2 tablets (40 mEq total) by mouth 2 (two) times daily. 120 tablet 6  . Probiotic Product (ALIGN PO) Take 1 tablet by mouth daily.    . sodium chloride (OCEAN) 0.65 % SOLN nasal spray Place 1 spray into both nostrils as needed for congestion.  0  . spironolactone (ALDACTONE) 25 MG tablet Take 1 tablet (25 mg total) by mouth daily.    . Tamsulosin HCl (FLOMAX) 0.4 MG CAPS Take 0.4 mg by mouth at bedtime.     . torsemide (DEMADEX) 20 MG tablet Take 60 mg in am 40 mg in pm 150 tablet 3  . vitamin B-12 1000 MCG tablet Take 1 tablet (1,000 mcg total) by mouth daily. 30 tablet 0   No current facility-administered medications for this visit.    Functional Status:   In your present state of health, do you have any difficulty performing the following activities: 10/24/2014 09/08/2014  Hearing? Tempie Donning  Vision? Y Y  Difficulty concentrating or making decisions? Tempie Donning  Walking or climbing stairs? Y Y  Dressing or bathing? Y Y  Doing errands, shopping? Tempie Donning  Preparing Food and eating ? - Y  Using the Toilet? - N  In the past six months, have you accidently leaked urine? - Y  Do you have problems with loss of bowel control? - N  Managing your Medications? - N  Managing your Finances? - N  Housekeeping or managing your Housekeeping? - Y    Assessment:    Chronic Health Condition (CHF, NYHA Class I) - Mr. Hennick was hospitalized in April for CHF exacerbation; he is being followed closely at the [facility] and is attending all scheduled appointments; Mr. Going daughter who is a Occupational psychologist, manages his medications/fills his pill box weekly and reviews meds multiple times weekly; Mr. Hoffert is taking  his medications as prescribed; Mr. Cato is weighing daily and recording and could verbalize today that he is to call for weight gain of 3# overnight or 5# in a week when noted; he and his wife can verbalize signs and symptoms of worsening heart failure/volume overload; Mr. Smethers admittedly has difficulty to his carb modified, low sodium/heart healthy diet but his wife works hard to try and help him.    Chronic Health Condition (DMII) - Mr. Axelson checks his cbg at least 4 times daily and tells me that he checks it frequently during the night for fear he might have a hypoglycemic episode (having witnessed his wife have a hypoglycemic episode once, he wants to avoid this); Mr. Costabile is taking  his insulin as prescribed; as noted, he admits to having difficulty in the area of adherence to prescribed carb modified diet; he allowed me to provide some education about his diet today but fairly quickly changed the subject and let me know he didn't want to discuss this further; Mr. Keziah has labs, including HgA1C, drawn on Friday and is to see Dr. Nevada Crane for routine office visit next week  Last HgA1C: 7.0  Fasting cbg today: 149  CBG Averages:  7 day (n=34) = 215 14 day (n=66) = 238 30 days (n=160) = 239  Chronic Health Condition ( Stage 2 Pressure ulcer left posterior thigh/gluteal fold) - Mr. Viscardi has been followed by Dr. Lavonna Monarch in [place] for > 30 years; Dr. Denna Haggard has been treating this pressure ulcer for some time and currently has Mr. Skillen on a regimen of Silver Nitrate solution which is being applied by Mr. Dommer twice daily; per Mr. Clouatre, Dr. Denna Haggard would like for him to be seen at the wound care center. I discussed this with Blake Divine, Patient Care Coordinator at Dr. Josue Hector office. Juliann Pulse received and provided to Dr. Nevada Crane the requested dermatology note from Dr. Denna Haggard and referred Mr. Hurlbut to the [facility]; Mr. Ploch will not be able to be seen until likely August; I  reviewed signs and symptoms of worsening wound condition with Mr. Dugal and advised him to call his provider (Dr. Nevada Crane or Dr. Denna Haggard if noted)  Plan:   Mr. Ayer will continue prescribed medications, low carb/heart healthy/low sodium diet, QID cbg checks, daily weights, daily BP/pulse/O2 sat measurements and will call his provider(s) with any findings outside established parameters. If he has difficulty contacting his providers, he will contact me.   Mr. Ridgley will await contact from the wound center for August appointment.   Mr. Mau will go to the lab on Friday as ordered and will attend his appointment with Dr. Nevada Crane next week as scheduled.   I will see Mr. Tollison at home again next month for routine home visit.   I will approach the subject of nutritionist/dietician referral when I see Mr. Scheel next month. He was somewhat resistant to nutrition education from me today but would benefit from expert nutrition advice and changes in his diet for management of diabetes and for wound healing.   [facility] CM Care Plan Problem One        Patient Outreach from 10/24/2014 in [place] Problem One  CHF with recent hospitalization   Care Plan for Problem One  Active   [facility] Long Term Goal (31-90 days)  patient will not be hospitalized in the next 31 days for CHF   [facility] Long Term Goal Start Date  09/08/14   [facility] Long Term Goal Met Date  10/24/14   Interventions for Problem One Long Term Goal  reviewed goals with patient   [facility] CM Short Term Goal #1 (0-30 days)  patient will attend all scheduled provider appointments in the next 30 days   [facility] CM Short Term Goal #1 Start Date  09/08/14   [facility] CM Short Term Goal #1 Met Date  10/24/14   Interventions for Short Term Goal #1  utilizing teachback method and blue [facility] calendar, reviewed upcoming appoitnments with patient   [facility] CM Short Term Goal #2 (0-30 days)  patient will verbalize understanding of plan of care in the next 30 days including  duration of [place] services and clinic participation  [facility] CM Short Term Goal #2 Start Date  09/08/14   [facility] CM Short Term Goal #2 Met Date  10/24/14   Interventions for Short Term Goal #2  utliizing teachback method, reviewed plan of care with patient including plans for [facility] services, transition to [facility] CM services, and CHF Clinic enrollment guidelines and participation requirements   [facility] CM Short Term Goal #3 (0-30 days)  over the next 30 days, patient will record bp, pulse, cbg, weight, and O2 saturation measurements and call for findings outside established parameters   [facility] CM Short Term Goal #3 Start Date  09/08/14   [facility] CM Short Term Goal #3 Met Date  10/24/14   Interventions for Short Tern Goal #3  utilizing teachback method and blue [facility] Calendar, reviewed plans for daily VS measurements and findings requiring call to provider    [place] Problem Two        Patient Outreach from 10/24/2014 in [place] Problem Two  Diabetes Management Concerns   Care Plan for Problem Two  Active   Interventions for Problem Two Long Term Goal   utilizing teachback method, reviewed with patient the iportance of establishing a formal diabetes plan of care   [facility] Long Term Goal (31-90) days  Patient will verbalize understanding of basic compnonents of diabetes plan of care in the next 31 days   [facility] Long Term Goal Start Date  10/24/14   [facility] CM Short Term Goal #1 (0-30 days)  in the next 30 days, patient will verbalize understanding of HgA1C, provider goal for HgA1C, and cbg targets   [facility] CM Short Term Goal #1 Start Date  10/24/14   Interventions for Short Term Goal #2   utilizing teachback method and [employer], provided information about HgA1C and CBG targets and the importance of understanding his numbers,  contacted provider office to get latest A1C data   [facility] CM Short Term Goal #2 (0-30 days)  over the next 30 days patient will verbalize basic understanding of  prescribed carb modifieid diet and portion management   [facility] CM Short Term Goal #2 Start Date  10/24/14   Interventions for Short Term Goal #2  utilizing teachback method and [employer], reviewed prescribed carb modified diet, macronutrient defiinitions, and portion control measures    [facility] CM Care Plan Problem Three        Patient Outreach from 10/24/2014 in [place] Problem Three  Skin Condition (Stage 2 Pressure Ulcer Left Upper Thigh/Gluteal Fold)   Care Plan for Problem Three  Active   [facility] Long Term Goal (31-90) days  Patient will receive appropriate care for skin condition over the next 31 days   [facility] Long Term Goal Start Date  10/24/14   Interventions for Problem Three Long Term Goal  utilizing teachback method, discussed plan of care for treatment of chronic skin condition and need for ongoing treatment and next steps in care   [facility] CM Short Term Goal #1 (0-30 days)  over the next 30 days, patient will be examined by primary care provider and will verbalize understanding of provider assessment of wound and plan of care   [facility] CM Short Term Goal #1 Start Date  10/24/14   Interventions for Short Term Goal #1  utilizing teachback method, stressed the importance of patient asking provider for assessment and plan of care related to wound care needs   [facility] CM Short Term  Goal #2 (0-30 days)  over the next 30 days, patient will schedule appointment with wound care center in [place] #2 Start Date  10/24/14   Interventions for Short Term Goal #2  contacted provider office to initate referral,  notified patient to expect call from wound care center for appointment        Janalyn Shy [place] Management  [phone]

## 2014-10-27 DIAGNOSIS — I10 Essential (primary) hypertension: Secondary | ICD-10-CM | POA: Diagnosis not present

## 2014-10-27 DIAGNOSIS — E1122 Type 2 diabetes mellitus with diabetic chronic kidney disease: Secondary | ICD-10-CM | POA: Diagnosis not present

## 2014-10-27 DIAGNOSIS — E782 Mixed hyperlipidemia: Secondary | ICD-10-CM | POA: Diagnosis not present

## 2014-10-31 DIAGNOSIS — E785 Hyperlipidemia, unspecified: Secondary | ICD-10-CM | POA: Diagnosis not present

## 2014-10-31 DIAGNOSIS — I10 Essential (primary) hypertension: Secondary | ICD-10-CM | POA: Diagnosis not present

## 2014-10-31 DIAGNOSIS — D696 Thrombocytopenia, unspecified: Secondary | ICD-10-CM | POA: Diagnosis not present

## 2014-10-31 DIAGNOSIS — R600 Localized edema: Secondary | ICD-10-CM | POA: Diagnosis not present

## 2014-10-31 DIAGNOSIS — I509 Heart failure, unspecified: Secondary | ICD-10-CM | POA: Diagnosis not present

## 2014-10-31 DIAGNOSIS — D649 Anemia, unspecified: Secondary | ICD-10-CM | POA: Diagnosis not present

## 2014-10-31 DIAGNOSIS — E119 Type 2 diabetes mellitus without complications: Secondary | ICD-10-CM | POA: Diagnosis not present

## 2014-10-31 DIAGNOSIS — N183 Chronic kidney disease, stage 3 (moderate): Secondary | ICD-10-CM | POA: Diagnosis not present

## 2014-11-02 ENCOUNTER — Other Ambulatory Visit: Payer: Self-pay | Admitting: *Deleted

## 2014-11-03 NOTE — Patient Outreach (Signed)
[facility]) Care Management   11/02/2014  [name] 1932/04/06 [id number]  [name] is an 81 y.o. male  Subjective: [name] is a 19 year with a PMH of morbid obesity, chronic diastolic heart failure, HTN, SVT/PSVT/PAT, CAD, DMII, OSA-CPAP, arthritis, fibromyalgia, and hyperlipidemia. [name] lives at home with his wife in [place]. He has a ramp, accessible van, accessible bathroom, and strong family support.   [facility] Care Management was consulted to assist with management of CHF after an admission for acute on chronic HF in April. [name] is being managed at the [facility] and has done fairly well with regard to Heart Failure management since this last admission.   [name]'s diabetic management is a concern in general because he is not at target for HgA1C, because of renal insufficiency, but also because he has a non-healing wound.   I am seeing [name] today for an acute visit in response to his call expressing concern about worsening condition of his non-healing wound.   Objective:   Review of Systems  Constitutional: Negative.     Physical Exam  Constitutional: He is oriented to person, place, and time. He appears well-developed and well-nourished. He is active.  Neurological: He is alert and oriented to person, place, and time.  Skin: Skin is warm and dry.  Psychiatric: He has a normal mood and affect. His behavior is normal. Judgment and thought content normal.    Assessment:    Chronic Health Condition ( Stage 2 Pressure ulcer left posterior thigh/gluteal fold) - [name] has been followed by Dr. Lavonna Monarch in [place] for > 30 years; Dr. Denna Haggard has been treating this pressure ulcer for some time and currently has [name] on a regimen of Silver Nitrate solution which is being applied by [name] twice daily; per my conversation with [name] last week, Dr. Denna Haggard wanted [name] to be seen at the wound care center in [place]. So,  I discussed this with Blake Divine, Patient Care Coordinator at Dr. Josue Hector office. Juliann Pulse received and provided to Dr. Nevada Crane the requested dermatology note from Dr. Denna Haggard and referred [name] to the [facility]. Mrs. Cheek informed me that [name] will not be able to be seen until likely August.   Today, when I visited [name], his son who went to his lat visit to Dr Denna Haggard was present and asked [name] if Dr. Denna Haggard had not requested that he get in touch with someone at the wound clinic at [facility]. [name]. Hayashi said "well, I just thought I'd get in over here at [facility] because it's a lot closer." Today, [name] says he would like to get Dr. Onalee Hua input again as to how to proceed with wound care center consult.    The wound appeared unchanged to me from last week's assessment. [name]. Burnside said what he really needed was a recommendation on what kind of dressing to put on it to keep it dry. He has been "folding up paper towels and putting them back there". I had supplies on hand and helped Mrs. Connors perform the SilverNitrate application followed by a demonstration of dressing application. I applied 2 folded clean 4x4's and taped in place with paper tape. Mrs. Heathcock said she felt she could do this dressing again.   Plan:   I will forward this note to Dr. Juel Burrow office and to Dr. Onalee Hua office following up to inquire about how  to proceed with wound care center referrals.   Routine home visit scheduled in 2 weeks to continue addressing other chronic comorbid conditions.    [facility]  [phone]

## 2014-11-09 ENCOUNTER — Other Ambulatory Visit: Payer: Self-pay | Admitting: *Deleted

## 2014-11-09 ENCOUNTER — Encounter: Payer: Self-pay | Admitting: *Deleted

## 2014-11-09 NOTE — Patient Outreach (Signed)
[facility]) Care Management   11/09/2014  [name] [birth year] [id number]  [name] is an 81 y.o. male  Subjective: [name] is a 4 year with a PMH of morbid obesity, chronic diastolic heart failure, HTN, SVT/PSVT/PAT, CAD, DMII, OSA-CPAP, arthritis, fibromyalgia, and hyperlipidemia. [name] lives at home with his wife in [place]. He has a ramp, accessible van, accessible bathroom, and strong family support.   [facility] Care Management was consulted to assist with management of CHF after an admission for acute on chronic HF in April. [name] is being managed at the [facility] and has done fairly well with regard to Heart Failure management since this last admission.   [name]'s diabetic management is a concern in general because he is not at target for HgA1C, because of renal insufficiency, but also because he has a non-healing wound.   I am seeing [name] today for follow up of pressure ulcer at right gluteal fold and chronic disease management (CHF, DM).   Objective:   Review of Systems  Constitutional: Negative.   HENT: Positive for hearing loss.        HOH  Eyes:       Blindness left eye  Respiratory: Negative.   Cardiovascular: Negative.   Gastrointestinal: Negative.   Genitourinary: Negative.   Musculoskeletal: Positive for myalgias. Negative for falls.  Skin: Negative.   Neurological: Negative.   Psychiatric/Behavioral: Negative.     Physical Exam  Constitutional: He is oriented to person, place, and time. Vital signs are normal. He appears well-developed and well-nourished. He is active.  Cardiovascular: Normal rate and regular rhythm.   Respiratory: Effort normal.  GI: Soft. Bowel sounds are normal.  Neurological: He is alert and oriented to person, place, and time.  Skin: Skin is warm and dry. Lesion noted.     Psychiatric: He has a normal mood and affect. His speech is normal and behavior is normal. Judgment and thought  content normal. Cognition and memory are normal.    Current Medications:   Current Outpatient Prescriptions  Medication Sig Dispense Refill  . amLODipine (NORVASC) 10 MG tablet TAKE ONE TABLET BY MOUTH ONCE DAILY. 30 tablet 6  . aspirin 81 MG chewable tablet Chew 2 tablets (162 mg total) by mouth daily.    . benzoyl peroxide 10 % gel Apply 1 application topically 3 (three) times daily.    . calcium-vitamin D (OSCAL WITH D) 500-200 MG-UNIT per tablet Take 1 tablet by mouth 2 (two) times daily.    . cetirizine (ZYRTEC) 10 MG tablet Take 10 mg by mouth every evening.     . Cholecalciferol (D-3-5) 5000 UNITS capsule Take 5,000 Units by mouth daily.     . fluticasone (FLONASE) 50 MCG/ACT nasal spray Place 2 sprays into the nose daily.     Marland Kitchen HYDROcodone-acetaminophen (NORCO/VICODIN) 5-325 MG per tablet Take 1-2 tablets by mouth every 6 (six) hours as needed for moderate pain. (Patient taking differently: Take 1 tablet by mouth every 6 (six) hours as needed for moderate pain. ) 30 tablet 0  . insulin aspart (NOVOLOG) 100 UNIT/ML injection Inject into the skin 3 (three) times daily before meals. Sliding scale based on sugar level    . insulin detemir (LEVEMIR) 100 UNIT/ML injection Inject 0.15 mLs (15 Units total) into the skin at bedtime. (Patient taking differently: Inject 28 Units into the skin at bedtime. ) 10 mL 12  . labetalol (NORMODYNE) 200 MG tablet Take 1 tablet (  200 mg total) by mouth 2 (two) times daily. 60 tablet 1  . latanoprost (XALATAN) 0.005 % ophthalmic solution Place 1 drop into the right eye at bedtime.     . lidocaine (LIDODERM) 5 % Place 1 patch onto the skin daily as needed (for pain). Remove & Discard patch within 12 hours or as directed by MD.    . metolazone (ZAROXOLYN) 2.5 MG tablet TAKE 1 TABLET BY MOUTH TWICE PER WEEK SUNDAYS AND THURSDAYS. 8 tablet 2  . Multiple Vitamin (MULTIVITAMIN) tablet Take 1 tablet by mouth daily.    . mupirocin ointment (BACTROBAN) 2 % Apply 1  application topically 2 (two) times daily as needed (ulcer on groin).    . potassium chloride SA (K-DUR,KLOR-CON) 20 MEQ tablet Take 2 tablets (40 mEq total) by mouth 2 (two) times daily. 120 tablet 6  . Probiotic Product (ALIGN PO) Take 1 tablet by mouth daily.    . silver nitrate applicators 27-25 % applicator Apply 1 application topically 2 (two) times daily.    . sodium chloride (OCEAN) 0.65 % SOLN nasal spray Place 1 spray into both nostrils as needed for congestion.  0  . spironolactone (ALDACTONE) 25 MG tablet Take 1 tablet (25 mg total) by mouth daily.    . Tamsulosin HCl (FLOMAX) 0.4 MG CAPS Take 0.4 mg by mouth at bedtime.     . torsemide (DEMADEX) 20 MG tablet Take 60 mg in am 40 mg in pm 150 tablet 3  . vitamin B-12 1000 MCG tablet Take 1 tablet (1,000 mcg total) by mouth daily. 30 tablet 0   No current facility-administered medications for this visit.    Functional Status:   In your present state of health, do you have any difficulty performing the following activities: 10/24/2014 09/08/2014  Hearing? Tempie Donning  Vision? Y Y  Difficulty concentrating or making decisions? Tempie Donning  Walking or climbing stairs? Y Y  Dressing or bathing? Y Y  Doing errands, shopping? Tempie Donning  Preparing Food and eating ? - Y  Using the Toilet? - N  In the past six months, have you accidently leaked urine? - Y  Do you have problems with loss of bowel control? - N  Managing your Medications? - N  Managing your Finances? - N  Housekeeping or managing your Housekeeping? - Y    Fall/Depression Screening:    PHQ 2/9 Scores 09/08/2014  PHQ - 2 Score 0    Assessment:    Chronic Health Condition (CHF, NYHA Class I) - [name] was hospitalized in April for CHF exacerbation; he is being followed closely at the [facility] and is attending all scheduled appointments; [name] daughter who is a Occupational psychologist, manages his medications/fills his pill box weekly and reviews meds multiple times weekly; [name]  is taking his medications as prescribed; [name] is weighing daily and recording and could verbalize today that he is to call for weight gain of 3# overnight or 5# in a week when noted; he and his wife can verbalize signs and symptoms of worsening heart failure/volume overload; [name]. Pavek admittedly has difficulty to his carb modified, low sodium/heart healthy diet but his wife works hard to try and help him.    Chronic Health Condition (DMII) - [name]. Decelle checks his cbg at least 4 times daily and tells me that he checks it frequently during the night for fear he might have a hypoglycemic episode (having witnessed his wife have a hypoglycemic episode once,  he wants to avoid this); [name]. Clouse is taking his insulin as prescribed; as noted, he admits to having difficulty in the area of adherence to prescribed carb modified diet; he allowed me to provide some education about his diet but fairly quickly changed the subject and let me know he didn't want to discuss this further; [name]. Caddell has labs, including HgA1C, drawn on Friday and is to see Dr. Nevada Crane for routine office visit next week. He has declined my offer to assist with referral to dietician.   Last HgA1C: 7.0  Fasting cbg today: 91  The CBG averages below, recorded last month, [name]. Borquez now tells me are from a monitor he and his wife share. Therefore, these averages do not reflect [name]. Guido's data.   CBG Averages:  7 day (n=34) = 215 14 day (n=66) = 238 30 days (n=160) = 239  Chronic Health Condition ( Stage 2 Pressure ulcer left posterior thigh/gluteal fold) - [name]. Jafari has been followed by Dr. Lavonna Monarch in [place] for > 30 years; Dr. Denna Haggard has been treating this pressure ulcer for some time and currently has [name]. Sweitzer on a regimen of Silver Nitrate solution which is being applied by [name]. Woodberry twice daily; per [name]. Orourke, Dr. Denna Haggard would like for him to be seen at the wound care center. I discussed this with Blake Divine, Patient Care  Coordinator at Dr. Josue Hector office. Juliann Pulse received and provided to Dr. Nevada Crane the requested dermatology note from Dr. Denna Haggard and referred [name]. Huston to the [facility]. [name]. Neville told me today that since I showed him/his wife how to keep the wound dry with 4x4's. He says he is also working to keep his clothing dry. He says the wound feels better and is staying dry and he doesn't feel he needs to go to the wound care center.   On examination, I noted no change in characteristics of wound but it was certainly not worsened.   Plan:   [name]. Cookson will continue prescribed medications, low carb/heart healthy/low sodium diet, QID cbg checks, daily weights, daily BP/pulse/O2 sat measurements and will call his provider(s) with any findings outside established parameters. If he has difficulty contacting his providers, he will contact me.   I will see [name]. Monjaraz at home again next month for routine home visit.   I will approach the subject of nutritionist/dietician referral when I see [name]. Corker next month. He was somewhat resistant to nutrition education from me today but would benefit from expert nutrition advice and changes in his diet for management of diabetes and for wound healing.   Plan:   [place] Problem One        Patient Outreach from 11/09/2014 in [place] Problem Two  Diabetes Management Concerns   Care Plan for Problem Two  Active   Interventions for Problem Two Long Term Goal   utilizing teachback method, reviewed with patient the iportance of establishing a formal diabetes plan of care   [facility] Long Term Goal (31-90) days  Patient will verbalize understanding of basic compnonents of diabetes plan of care in the next 31 days   [facility] Long Term Goal Start Date  11/09/14   [facility] Long Term Goal Met Date  -- [goal not met,  same goal for next 31 days]   [facility] CM Short Term Goal #1 (0-30 days)  in the next 30 days, patient will verbalize understanding of  HgA1C, provider  goal for HgA1C, and cbg targets   [facility] CM Short Term Goal #1 Start Date  11/09/14   Interventions for Short Term Goal #2   utilizing teachback method and Emmi educational materials, provided information about HgA1C and CBG targets and the importance of understanding his numbers,  contacted provider office to get latest A1C data   [facility] CM Short Term Goal #2 (0-30 days)  over the next 30 days patient will verbalize basic understanding of prescribed carb modifieid diet and portion management   [facility] CM Short Term Goal #2 Start Date  11/09/14   Interventions for Short Term Goal #2  utilizing teachback method and [employer], reviewed prescribed carb modified diet, macronutrient defiinitions, and portion control measures    [facility] CM Care Plan Problem Two       Patient Outreach from 11/09/2014 in [place] Problem Three  Skin Condition (Stage 2 Pressure Ulcer Left Upper Thigh/Gluteal Fold)   Care Plan for Problem Three  Active   [facility] Long Term Goal (31-90) days  Patient will receive appropriate care for skin condition over the next 31 days   [facility] Long Term Goal Start Date  10/24/14   [facility] Long Term Goal Met Date  11/09/14   Interventions for Problem Three Long Term Goal  utilizing teachback method, discussed plan of care for treatment of chronic skin condition and need for ongoing treatment and next steps in care   [facility] CM Short Term Goal #1 (0-30 days)  over the next 30 days, patient will be examined by primary care provider and will verbalize understanding of provider assessment of wound and plan of care   [facility] CM Short Term Goal #1 Start Date  10/24/14   [facility] CM Short Term Goal #1 Met Date  11/09/14   Interventions for Short Term Goal #1  utilizing teachback method, stressed the importance of patient asking provider for assessment and plan of care related to wound care needs   [facility] CM Short Term Goal #2 (0-30 days)  over the next 30 days, patient will  schedule appointment with wound care center in [place] Term Goal #2 Start Date  10/24/14   [facility] CM Short Term Goal #2 Met Date  -- [not met,  patient decided he is not going to go to wound care]   Interventions for Short Term Goal #2  contacted provider office to initate referral,  notified patient to expect call from wound care center for appointment        Janalyn Shy [place] [facility]  [phone]

## 2014-11-10 ENCOUNTER — Encounter: Payer: Self-pay | Admitting: *Deleted

## 2014-11-13 ENCOUNTER — Other Ambulatory Visit (HOSPITAL_COMMUNITY): Payer: Self-pay | Admitting: Internal Medicine

## 2014-11-27 ENCOUNTER — Other Ambulatory Visit (HOSPITAL_COMMUNITY): Payer: Self-pay | Admitting: Adult Health

## 2014-11-30 DIAGNOSIS — L97121 Non-pressure chronic ulcer of left thigh limited to breakdown of skin: Secondary | ICD-10-CM | POA: Diagnosis not present

## 2014-11-30 DIAGNOSIS — R3981 Functional urinary incontinence: Secondary | ICD-10-CM | POA: Diagnosis not present

## 2014-12-01 ENCOUNTER — Other Ambulatory Visit: Payer: Self-pay | Admitting: *Deleted

## 2014-12-04 ENCOUNTER — Encounter: Payer: Self-pay | Admitting: *Deleted

## 2014-12-04 ENCOUNTER — Other Ambulatory Visit: Payer: Self-pay | Admitting: *Deleted

## 2014-12-04 NOTE — Patient Outreach (Signed)
[facility]) Care Management   12/04/2014  [name] [birth year] [id number]  [name] is an 81 y.o. male  Subjective: [name] is a 11 year with a PMH of morbid obesity, chronic diastolic heart failure, HTN, SVT/PSVT/PAT, CAD, DMII, OSA-CPAP, arthritis, fibromyalgia, and hyperlipidemia. [name]. [name] lives at home with his wife in [place]. He has a ramp, accessible van, accessible bathroom, and strong family support.   [facility] Care Management was consulted to assist with management of CHF after an admission for acute on chronic HF in April. [name]. [name] is being managed at the [facility] and has done fairly well with regard to Heart Failure management since this last admission.   [name]. [name]'s diabetic management is a concern in general because he is not at target for HgA1C, because of renal insufficiency, but also because he has a non-healing wound of the right gluteal fold.   Objective: BP 122/62 mmHg  Pulse 62  SpO2 96%   Review of Systems  Constitutional: Negative.   Eyes: Negative.   Respiratory: Negative.   Cardiovascular: Negative.   Gastrointestinal: Negative.   Genitourinary: Positive for frequency.  Musculoskeletal: Positive for myalgias. Negative for falls.  Neurological: Negative.   Psychiatric/Behavioral: Negative.     Physical Exam  Constitutional: He is oriented to person, place, and time. Vital signs are normal. He appears well-developed and well-nourished. He is active.  Eyes:    Cardiovascular: Normal rate and regular rhythm.   Respiratory: Effort normal. He has no wheezes. He has no rales.  GI: Soft. Bowel sounds are normal.  Neurological: He is alert and oriented to person, place, and time.  Skin: Lesion noted.     Psychiatric: He has a normal mood and affect. His speech is normal and behavior is normal. Judgment and thought content normal. Cognition and memory are normal.    Current Medications:   Current Outpatient  Prescriptions  Medication Sig Dispense Refill  . amLODipine (NORVASC) 10 MG tablet TAKE ONE TABLET BY MOUTH ONCE DAILY. 30 tablet 6  . aspirin 81 MG chewable tablet Chew 2 tablets (162 mg total) by mouth daily.    . calcium-vitamin D (OSCAL WITH D) 500-200 MG-UNIT per tablet Take 1 tablet by mouth 2 (two) times daily.    . cetirizine (ZYRTEC) 10 MG tablet Take 10 mg by mouth every evening.     . Cholecalciferol (D-3-5) 5000 UNITS capsule Take 5,000 Units by mouth daily.     . fluticasone (FLONASE) 50 MCG/ACT nasal spray Place 2 sprays into the nose daily.     Marland Kitchen HYDROcodone-acetaminophen (NORCO/VICODIN) 5-325 MG per tablet Take 1-2 tablets by mouth every 6 (six) hours as needed for moderate pain. (Patient taking differently: Take 1 tablet by mouth every 6 (six) hours as needed for moderate pain. ) 30 tablet 0  . insulin aspart (NOVOLOG) 100 UNIT/ML injection Inject into the skin 3 (three) times daily before meals. Sliding scale based on sugar level    . insulin detemir (LEVEMIR) 100 UNIT/ML injection Inject 0.15 mLs (15 Units total) into the skin at bedtime. (Patient taking differently: Inject 28 Units into the skin at bedtime. ) 10 mL 12  . labetalol (NORMODYNE) 200 MG tablet Take 1 tablet (200 mg total) by mouth 2 (two) times daily. 60 tablet 1  . latanoprost (XALATAN) 0.005 % ophthalmic solution Place 1 drop into the right eye at bedtime.     . lidocaine (LIDODERM) 5 % Place 1 patch onto the skin  daily as needed (for pain). Remove & Discard patch within 12 hours or as directed by MD.    . metolazone (ZAROXOLYN) 2.5 MG tablet TAKE 1 TABLET BY MOUTH TWICE PER WEEK SUNDAYS AND THURSDAYS. 8 tablet 2  . Multiple Vitamin (MULTIVITAMIN) tablet Take 1 tablet by mouth daily.    . mupirocin ointment (BACTROBAN) 2 % Apply 1 application topically 2 (two) times daily as needed (ulcer on groin).    . potassium chloride SA (K-DUR,KLOR-CON) 20 MEQ tablet Take 2 tablets (40 mEq total) by mouth 2 (two) times daily.  120 tablet 6  . Probiotic Product (ALIGN PO) Take 1 tablet by mouth daily.    . silver nitrate applicators 54-56 % applicator Apply 1 application topically 2 (two) times daily.    . sodium chloride (OCEAN) 0.65 % SOLN nasal spray Place 1 spray into both nostrils as needed for congestion.  0  . spironolactone (ALDACTONE) 25 MG tablet Take 1 tablet (25 mg total) by mouth daily.    . Tamsulosin HCl (FLOMAX) 0.4 MG CAPS Take 0.4 mg by mouth at bedtime.     . torsemide (DEMADEX) 20 MG tablet TAKE 3 TABLETS BY MOUTH IN THE AM AND 2 TABLETS IN THE PM. 150 tablet 3  . vitamin B-12 1000 MCG tablet Take 1 tablet (1,000 mcg total) by mouth daily. 30 tablet 0  . benzoyl peroxide 10 % gel Apply 1 application topically 3 (three) times daily.     No current facility-administered medications for this visit.    Functional Status:   In your present state of health, do you have any difficulty performing the following activities: 12/04/2014 11/09/2014  Hearing? Tempie Donning  Vision? Y Y  Difficulty concentrating or making decisions? Tempie Donning  Walking or climbing stairs? Y Y  Dressing or bathing? Y Y  Doing errands, shopping? Y Y  Preparing Food and eating ? - -  Using the Toilet? - -  In the past six months, have you accidently leaked urine? - -  Do you have problems with loss of bowel control? - -  Managing your Medications? - -  Managing your Finances? - -  Housekeeping or managing your Housekeeping? - -    Fall/Depression Screening:    PHQ 2/9 Scores 11/09/2014 09/08/2014  PHQ - 2 Score 0 0    Assessment:    Chronic Health Condition (CHF, NYHA Class I) - [name]. [name] was hospitalized in April for CHF exacerbation; he is being followed closely at the [facility] and is attending all scheduled appointments; [name]. [name] daughter who is a Occupational psychologist, manages his medications/fills his pill box weekly and reviews meds multiple times weekly; [name]. [name] is taking his medications as prescribed; [name]. [name] is weighing  daily and recording and can verbalize that he is to call for weight gain of 3# overnight or 5# in a week when noted; he and his wife can verbalize signs and symptoms of worsening heart failure/volume overload; [name]. [name] admittedly has difficulty adhering to his carb modified, low sodium/heart healthy diet but his wife works hard to try and help him.    Chronic Health Condition (DMII) - [name]. [name] checks his cbg at least 4 times daily and tells me that he checks it frequently during the night for fear he might have a hypoglycemic episode (having witnessed his wife have a hypoglycemic episode once, he wants to avoid this); [name]. [name] is taking his insulin as prescribed; as noted, he admits to having difficulty  in the area of adherence to prescribed carb modified diet; he allows me to provide some dietary education but is quick to change the subject and let me know he doesn't want to discuss his diet. He has declined my offer to assist with referral to dietician.   Last HgA1C: 7.0  Fasting cbg today: 136  Prior to last month [name]. [name] and his wife were sharing a CBG meter and therefore all his reported averages were erroneous. I encouraged him to get his own monitor and his daughter helped secure it and show him how to use it. The averages below reflect only [name]. [name] data from the last 30 days:    CBG Averages:  7 day (n=11) 223 14 day (n=29) 221 30 days (n=25) 226  Chronic Health Condition (Stage 2 Pressure ulcer left posterior thigh/gluteal fold) - [name]. [name] has been followed by Dr. Lavonna Monarch in [place] for > 30 years; Dr. Denna Haggard has been treating this pressure ulcer for some time and currently has [name]. [name] on a regimen of Silver Nitrate solution which was being applied by [name]. [name] twice daily; per [name]. [name], Dr. Denna Haggard would like for him to be seen at the wound care center.   [name]. [name] did not want to go to the [place] so secured a referral to the [place] at [facility] [phone]). He was seen there last week and prescribed the following wound care regimen:   Cleanse wound with mild soap and water  Mepilex Ag every other day with Medipore tape to secure daily and as needed   On examination, I noted no change in characteristics of wound but it was certainly not worsened. I cleansed the wound with mild soap and water, applied Mepilex Ag and secured with Medipore (please see Environmental health practitioner for images).   [name]. [name] notes that, because of the location of the wound, he finds it difficult to keep the wound dry.   Plan:   [name]. Schroeter will continue prescribed medications, low carb/heart healthy/low sodium diet, QID cbg checks, daily weights, daily BP/pulse/O2 sat measurements and will call his provider(s) with any findings outside established parameters. If he has difficulty contacting his providers, he will contact me.   I will see [name]. Buckner at home again next month for routine home visit.   I will approach the subject of nutritionist/dietician referral when I see [name]. Jeppsen next month. He was somewhat resistant to nutrition education  but would benefit from expert nutrition advice and changes in his diet for management of diabetes and for wound healing.    [facility] CM Care Plan Problem One        Patient Outreach from 11/09/2014 in [place] Problem One  CHF with recent hospitalization   Care Plan for Problem One  Not Active    [facility] CM Care Plan Problem Two        Patient Outreach from 12/04/2014 in [place] Problem Two  Diabetes Management Concerns   Care Plan for Problem Two  Active   Interventions for Problem Two Long Term Goal   utilizing teachback method, reviewed with patient the iportance of establishing a formal diabetes plan of care   [facility]N Long Term Goal (31-90) days  Patient will verbalize understanding of basic compnonents of diabetes plan of care in the  next 31 days   [facility] Long Term Goal Start Date  12/04/14 [not met,  re-established]   [facility]N Long Term Goal Met Date  -- [goal not met last month,  same goal for next 31 days]   [facility] CM Short Term Goal #1 (0-30 days)  in the next 30 days, patient will verbalize understanding of HgA1C, provider goal for HgA1C, and cbg targets   [facility] CM Short Term Goal #1 Start Date  12/04/14 Barrie Folk not met last month,  re-established]   Interventions for Short Term Goal #2   utilizing teachback method and Emmi educational materials, provided information about HgA1C and CBG targets and the importance of understanding his numbers,  contacted provider office to get latest A1C data   [facility] CM Short Term Goal #2 (0-30 days)  over the next 30 days patient will verbalize basic understanding of prescribed carb modifieid diet and portion management   [facility] CM Short Term Goal #2 Start Date  12/04/14 Barrie Folk not met last month,  re-established]   Interventions for Short Term Goal #2  utilizing teachback method and [employer], reviewed prescribed carb modified diet, macronutrient defiinitions, and portion control measures    [facility] CM Care Plan Problem Three        Patient Outreach from 12/04/2014 in [place] Problem Three  Skin Condition (Stage 2 Pressure Ulcer Left Upper Thigh/Gluteal Fold)   Care Plan for Problem Three  Active   THN Long Term Goal (31-90) days  Patient will receive appropriate care for skin condition over the next 31 days   [facility]N Long Term Goal Start Date  12/04/14   Interventions for Problem Three Long Term Goal  utilizing teachback method, discussed plan of care for treatment of chronic skin condition and need for ongoing treatment and next steps in care   [facility] CM Short Term Goal #1 (0-30 days)  over the next 30 days, patient will attend scheduled wound care appointments   [facility] CM Short Term Goal #1 Start Date  12/04/14   Interventions for Short Term Goal #1  utilizing teachback  method, reviewed upcoming appointment with patient and importance of attending/no missing   [facility] CM Short Term Goal #2 (0-30 days)  over the next 30 days, patient and spouse will demonstrate proper technique for performance of prescribed wound care regimen   [facility] CM Short Term Goal #2 Start Date  12/04/14   [facility] CM Short Term Goal #2 Met Date  -- [not met,  patient decided he is not going to go to wound care]   Interventions for Short Term Goal #2  utilizing demonstration and teachback method, reviewed prescribed wound care technique and prescribed regimen with patient and spouse      [place] Management  [phone]

## 2014-12-06 ENCOUNTER — Other Ambulatory Visit: Payer: Self-pay | Admitting: *Deleted

## 2014-12-07 ENCOUNTER — Ambulatory Visit: Payer: Medicare Other | Admitting: *Deleted

## 2014-12-07 ENCOUNTER — Other Ambulatory Visit: Payer: Self-pay | Admitting: *Deleted

## 2014-12-07 DIAGNOSIS — E1122 Type 2 diabetes mellitus with diabetic chronic kidney disease: Secondary | ICD-10-CM | POA: Diagnosis not present

## 2014-12-07 DIAGNOSIS — N189 Chronic kidney disease, unspecified: Secondary | ICD-10-CM | POA: Diagnosis not present

## 2014-12-07 DIAGNOSIS — E11622 Type 2 diabetes mellitus with other skin ulcer: Secondary | ICD-10-CM | POA: Diagnosis not present

## 2014-12-07 DIAGNOSIS — E669 Obesity, unspecified: Secondary | ICD-10-CM | POA: Diagnosis not present

## 2014-12-07 DIAGNOSIS — I509 Heart failure, unspecified: Secondary | ICD-10-CM | POA: Diagnosis not present

## 2014-12-07 DIAGNOSIS — L97121 Non-pressure chronic ulcer of left thigh limited to breakdown of skin: Secondary | ICD-10-CM | POA: Diagnosis not present

## 2014-12-07 DIAGNOSIS — I129 Hypertensive chronic kidney disease with stage 1 through stage 4 chronic kidney disease, or unspecified chronic kidney disease: Secondary | ICD-10-CM | POA: Diagnosis not present

## 2014-12-07 DIAGNOSIS — R3981 Functional urinary incontinence: Secondary | ICD-10-CM | POA: Diagnosis not present

## 2014-12-07 DIAGNOSIS — E785 Hyperlipidemia, unspecified: Secondary | ICD-10-CM | POA: Diagnosis not present

## 2014-12-07 NOTE — Patient Outreach (Signed)
[facility]) Care Management  12/07/2014  CASEY FYE 22-Apr-1932 [id number]  Call received from Mr. Mcbain who states he visited his provider at the wound care center at [facility] today. He asked if I would contact Jody RN @ [phone]. I was unable to reach [place] but left a message on her voice mail requesting a return call. Mr. Creason states Jeral Fruit has details about his wound care regimen.    [place] Management  [phone]

## 2014-12-12 ENCOUNTER — Other Ambulatory Visit: Payer: Self-pay | Admitting: *Deleted

## 2014-12-12 NOTE — Patient Outreach (Signed)
[facility]) Care Management   12/12/2014  [name] 09-01-1931 [id number]  [name] is an 81 y.o. male  Subjective: [name] is a 31 year with a PMH of morbid obesity, chronic diastolic heart failure, HTN, SVT/PSVT/PAT, CAD, DMII, OSA-CPAP, arthritis, fibromyalgia, and hyperlipidemia. [name] lives at home with his wife in [place]. He has a ramp, accessible van, accessible bathroom, and strong family support.   [facility] Care Management was consulted to assist with management of CHF after an admission for acute on chronic HF in April. [name] is being managed at the [facility] and has done fairly well with regard to Heart Failure management since this last admission.   [name]'s diabetic management is a concern in general because he is not at target for HgA1C, because of renal insufficiency, but also because he has a non-healing wound of the right gluteal fold.   I am seeing him at home today for follow wound visit. He is to see his wound care provider next week.   Objective:   Review of Systems  Constitutional: Negative.   HENT: Negative.   Eyes: Negative.   Respiratory: Negative.   Cardiovascular: Negative.   Gastrointestinal: Negative.   Genitourinary: Negative.   Musculoskeletal: Negative for falls.  Neurological: Negative.   Psychiatric/Behavioral: Negative.     Physical Exam  Constitutional: He is oriented to person, place, and time. He appears well-developed and well-nourished. He is active.  Respiratory: Effort normal.  GI: Soft.  Neurological: He is alert and oriented to person, place, and time.  Skin: Skin is warm and dry. Lesion noted.     Psychiatric: He has a normal mood and affect. His behavior is normal. Judgment and thought content normal.   Assessment:    Chronic Health Condition (Stage 2 Pressure ulcer left posterior thigh/gluteal fold) - [name] is being followed in the Wound Care and [place] at [facility] [phone]). His nurse is Office manager @ [phone]. Per [name] report, he was prescribed the wound care regimen outlined below and told he likely would only need a few visits to the wound center provided his wound continued to heal well.   [name] was prescribed the following wound care regimen:   Cleanse wound with mild soap and water  Mepilex Ag every other day with Medipore tape to secure daily and as needed  On examination, I noted improvement in wound edge healing.    [name] notes that, because of the location of the wound, he finds it difficult to keep the wound dry. He says the wound care center recommended that he put on spandex bike shorts to keep the dressing in place. He said he doesn't want to wear the shorts and is instead going to stay in bed until the wound heals. I strongly encouraged him to follow provider orders and discouraged his plan of lying in bed until the wound heals.    Plan:   [name] will continue prescribed medications, low carb/heart healthy/low sodium diet, QID cbg checks, daily weights, daily BP/pulse/O2 sat measurements and will call his provider(s) with any findings outside established parameters. If he has difficulty contacting his providers, he will contact me.   [name] will return to the wound care center next week.   I will see [name] at home again next month for routine home visit. We will resume our focus on diabetes and CHF disease management at that time.  [place] Management  [phone]

## 2014-12-15 ENCOUNTER — Other Ambulatory Visit: Payer: Self-pay | Admitting: *Deleted

## 2014-12-20 ENCOUNTER — Other Ambulatory Visit: Payer: Self-pay | Admitting: *Deleted

## 2014-12-22 NOTE — Patient Outreach (Signed)
[facility]) Care Management   12/20/2014  [name] [birth year] [id number]  [name] is an 81 y.o. male  Subjective: [name] is a 71 year with a PMH of morbid obesity, chronic diastolic heart failure, HTN, SVT/PSVT/PAT, CAD, DMII, OSA-CPAP, arthritis, fibromyalgia, and hyperlipidemia. [name] lives at home with his wife in [place]. He has a ramp, accessible van, accessible bathroom, and strong family support.   [facility] Care Management was consulted to assist with management of CHF after an admission for acute on chronic HF in April. [name] is being managed at the [facility] and has done fairly well with regard to Heart Failure management since this last admission.   [name] diabetic management is a concern in general because he is not at target for HgA1C, because of renal insufficiency, but also because he has a non-healing wound of the right gluteal fold.   Objective:  BP 140/62 mmHg  Pulse 65  SpO2 96%  Review of Systems  Constitutional: Negative.   HENT: Positive for ear discharge.        Reports mild intermittent clear discharge from left ear; none visible on examination  Eyes: Negative.   Cardiovascular: Positive for leg swelling.       Baseline 2+ bilateral lower extremity edema  Gastrointestinal: Negative.   Genitourinary: Negative.   Musculoskeletal: Negative for falls.  Skin: Negative.   Neurological: Negative.   Psychiatric/Behavioral: Negative.     Physical Exam  Constitutional: He is oriented to person, place, and time. Vital signs are normal. He appears well-developed and well-nourished. He is active.  Cardiovascular: Normal rate and regular rhythm.   Respiratory: Effort normal and breath sounds normal.  GI: Soft. Bowel sounds are normal.  Neurological: He is alert and oriented to person, place, and time.  Skin: Skin is warm and dry.  Psychiatric: He has a normal mood and affect. His behavior is normal. Judgment and  thought content normal.    Current Medications:   Current Outpatient Prescriptions  Medication Sig Dispense Refill  . amLODipine (NORVASC) 10 MG tablet TAKE ONE TABLET BY MOUTH ONCE DAILY. 30 tablet 6  . aspirin 81 MG chewable tablet Chew 2 tablets (162 mg total) by mouth daily.    . benzoyl peroxide 10 % gel Apply 1 application topically 3 (three) times daily.    . calcium-vitamin D (OSCAL WITH D) 500-200 MG-UNIT per tablet Take 1 tablet by mouth 2 (two) times daily.    . cetirizine (ZYRTEC) 10 MG tablet Take 10 mg by mouth every evening.     . Cholecalciferol (D-3-5) 5000 UNITS capsule Take 5,000 Units by mouth daily.     . fluticasone (FLONASE) 50 MCG/ACT nasal spray Place 2 sprays into the nose daily.     Marland Kitchen HYDROcodone-acetaminophen (NORCO/VICODIN) 5-325 MG per tablet Take 1-2 tablets by mouth every 6 (six) hours as needed for moderate pain. (Patient taking differently: Take 1 tablet by mouth every 6 (six) hours as needed for moderate pain. ) 30 tablet 0  . insulin aspart (NOVOLOG) 100 UNIT/ML injection Inject into the skin 3 (three) times daily before meals. Sliding scale based on sugar level    . insulin detemir (LEVEMIR) 100 UNIT/ML injection Inject 0.15 mLs (15 Units total) into the skin at bedtime. (Patient taking differently: Inject 28 Units into the skin at bedtime. ) 10 mL 12  . labetalol (NORMODYNE) 200 MG tablet Take 1 tablet (200 mg total) by mouth 2 (two) times daily.  60 tablet 1  . latanoprost (XALATAN) 0.005 % ophthalmic solution Place 1 drop into the right eye at bedtime.     . lidocaine (LIDODERM) 5 % Place 1 patch onto the skin daily as needed (for pain). Remove & Discard patch within 12 hours or as directed by MD.    . metolazone (ZAROXOLYN) 2.5 MG tablet TAKE 1 TABLET BY MOUTH TWICE PER WEEK SUNDAYS AND THURSDAYS. 8 tablet 2  . Multiple Vitamin (MULTIVITAMIN) tablet Take 1 tablet by mouth daily.    . mupirocin ointment (BACTROBAN) 2 % Apply 1 application topically 2 (two)  times daily as needed (ulcer on groin).    . potassium chloride SA (K-DUR,KLOR-CON) 20 MEQ tablet Take 2 tablets (40 mEq total) by mouth 2 (two) times daily. 120 tablet 6  . Probiotic Product (ALIGN PO) Take 1 tablet by mouth daily.    . silver nitrate applicators 63-14 % applicator Apply 1 application topically 2 (two) times daily.    . sodium chloride (OCEAN) 0.65 % SOLN nasal spray Place 1 spray into both nostrils as needed for congestion.  0  . spironolactone (ALDACTONE) 25 MG tablet Take 1 tablet (25 mg total) by mouth daily.    . Tamsulosin HCl (FLOMAX) 0.4 MG CAPS Take 0.4 mg by mouth at bedtime.     . torsemide (DEMADEX) 20 MG tablet TAKE 3 TABLETS BY MOUTH IN THE AM AND 2 TABLETS IN THE PM. 150 tablet 3  . vitamin B-12 1000 MCG tablet Take 1 tablet (1,000 mcg total) by mouth daily. 30 tablet 0   No current facility-administered medications for this visit.    Functional Status:   In your present state of health, do you have any difficulty performing the following activities: 12/04/2014 12/04/2014  Hearing? [name]  Vision? Y Y  Difficulty concentrating or making decisions? [name]  Walking or climbing stairs? Y Y  Dressing or bathing? Y Y  Doing errands, shopping? Y Y  Preparing Food and eating ? - -  Using the Toilet? - -  In the past six months, have you accidently leaked urine? - -  Do you have problems with loss of bowel control? - -  Managing your Medications? - -  Managing your Finances? - -  Housekeeping or managing your Housekeeping? - -    Fall/Depression Screening:    PHQ 2/9 Scores 12/04/2014 11/09/2014 09/08/2014  PHQ - 2 Score 0 0 0    Assessment:    Acute Health Condition (left ear drainage) - [name] reports today that he has been having small amounts of clear fluid drain from his left ear; he denies fever, pain, chills, or any other discharge from the ear; on examination, no drainage is noted; I helped him find Dr. Melida [name]' contact information as per his request as  he has seen Dr. Redmond [name] in the past   Chronic Health Condition (Stage 2 Pressure ulcer left posterior thigh/gluteal fold) - [name] is being followed in the Wound Care and [place] at [facility] [phone]). His nurse is Office manager @ [phone]. [name] cancelled his follow up appointment for tomorrow because he said he was told he didn't have to come back if the wound was healing. I advised that he contact the department nurse again to ensure that he is correct about this follow up.    [name]. Leask was prescribed the following wound care regimen:   Cleanse wound with mild soap and water  Mepilex Ag  every other day with Medipore tape to secure daily and as needed  On examination last week, I noted improvement in wound edge healing. This week the surface of the wound is covered with a scab. Surrounding tissue is pink and intact. Please see images from beginning of therapy and this week below and in Media Manager:     Stage 2 Pressure Ulcer Left Posterior Thigh/Gluteal Fold Week 1    Pressure Ulcer Left Posterior Thigh/Gluteal Fold Week 3    Chronic Health Condition (CHF, NYHA Class I) - [name]. Kalan was hospitalized in April for CHF exacerbation; he is being followed at the [facility] and is attending all scheduled appointments; [name] daughter who is a Occupational psychologist, manages his medications/fills his pill box weekly and reviews meds multiple times weekly; [name] is taking his medications as prescribed; [name] has not been weighing daily since he began having problems with wound care; he and his wife can verbalize need to call for overnight weight gain of 3# or 5# in a week and he and his wife can verbalize signs and symptoms of worsening heart failure/volume overload; [name] admittedly has difficulty adhering to his carb modified, low sodium/heart healthy diet but his wife works hard to try and help him.    Chronic Health Condition (DMII) - [name]  checks his cbg at least 4 times daily and tells me that he checks it frequently during the night for fear he might have a hypoglycemic episode (having witnessed his wife have a hypoglycemic episode once, he wants to avoid this); [name] is taking his insulin as prescribed; as noted, he admits to having difficulty in the area of adherence to prescribed carb modified diet; he allows me to provide some dietary education but is quick to change the subject and let me know he doesn't want to discuss his diet. He has declined my offer to assist with referral to dietician.   Last HgA1C: 7.0  Fasting cbg today: 232  [name] and his wife were previously sharing a CBG meter and therefore all his reported averages were erroneous. I encouraged him to get his own monitor and his daughter helped secure it and show him how to use it.    CBG Averages:  7 day (n=11) 223 14 day (n=29) 221 30 days (n=25) 226  Plan:   [name] will continue prescribed medications, low carb/heart healthy/low sodium diet, QID cbg checks, daily weights, daily BP/pulse/O2 sat measurements and will call his provider(s) with any findings outside established parameters. If he has difficulty contacting his providers, he will contact me.   [name]. Soltau will contact Dr. Melida [name]' office to schedule an appointment for follow up about ear drainage.   Mrs. Bitting will continue application of dressings as per instructions by the [facility] at [facility].   I will see [name] at home again next month for routine home visit. We will assess his progress on diabetes and CHF self management. If [name] continues to do well in this area and his wound is healed, I will likely transition him to telephonic case management.    [facility] CM Care Plan Problem One        Most Recent Value   Care Plan Problem One  Acute Health Condition (drainage from left ear with mild discomfort)    Role Documenting the Problem One  Care Management [place] for Problem One  Active  THN Long Term Goal (31-90 days)  Over the next 31 days patient will receive appropriate care for chronic intermittent ear condition   [facility]N Long Term Goal Start Date  12/20/14   Interventions for Problem One Long Term Goal  Reviewed contact information for requested provider   [facility] CM Short Term Goal #1 (0-30 days)  Over the next 7 days, patient will contact provider to schedule appointment   [facility] CM Short Term Goal #1 Start Date  12/20/14    [facility] CM Care Plan Problem Two        Most Recent Value   Care Plan Problem Two  Diabetes Management Concerns   Role Documenting the Problem Two  Care Management Coordinator   Care Plan for Problem Two  Active   Interventions for Problem Two Long Term Goal   utilizing teachback method, reviewed with patient the iportance of establishing a formal diabetes plan of care   THN Long Term Goal (31-90) days  Patient will verbalize understanding of basic compnonents of diabetes plan of care in the next 31 days   THN Long Term Goal Start Date  12/04/14 [not met,  re-established]   THN Long Term Goal Met Date     [facility]N CM Short Term Goal #1 (0-30 days)  in the next 30 days, patient will verbalize understanding of HgA1C, provider goal for HgA1C, and cbg targets   [facility]N CM Short Term Goal #1 Start Date  12/04/14 Barrie Folk not met last month,  re-established]   Interventions for Short Term Goal #2   utilizing teachback method and Emmi educational materials, provided information about HgA1C and CBG targets and the importance of understanding his numbers,  contacted provider office to get latest A1C data   [facility]N CM Short Term Goal #2 (0-30 days)  over the next 30 days patient will verbalize basic understanding of prescribed carb modifieid diet and portion management   [facility]N CM Short Term Goal #2 Start Date  12/04/14 Barrie Folk not met last month,  re-established]   Interventions for Short Term Goal #2  utilizing teachback method and Emmi educational  materials, reviewed prescribed carb modified diet, macronutrient defiinitions, and portion control measures    [facility]N CM Care Plan Problem Three        Most Recent Value   Care Plan Problem Three  Skin Condition (Stage 2 Pressure Ulcer Left Upper Thigh/Gluteal Fold)   Role Documenting the Problem Three  Care Management Coordinator   Care Plan for Problem Three  Active   THN Long Term Goal (31-90) days  Patient will receive appropriate care for skin condition over the next 31 days   THN Long Term Goal Start Date  12/04/14   Interventions for Problem Three Long Term Goal  utilizing teachback method, discussed plan of care for treatment of chronic skin condition and need for ongoing treatment and next steps in care   [facility] CM Short Term Goal #1 (0-30 days)  over the next 30 days, patient will attend scheduled wound care appointments   [facility] CM Short Term Goal #1 Start Date  12/04/14   Interventions for Short Term Goal #1  utilizing teachback method, reviewed upcoming appointment with patient and importance of attending/no missing   [facility]N CM Short Term Goal #2 (0-30 days)  over the next 30 days, patient and spouse will demonstrate proper technique for performance of prescribed wound care regimen   [facility]N CM Short Term Goal #2 Start Date  12/04/14   [facility] CM Short Term Goal #  2 Met Date     Interventions for Short Term Goal #2  utilizing demonstration and teachback method, reviewed prescribed wound care technique and prescribed regimen with patient and spouse      [place] Care Management  [phone]

## 2014-12-24 DIAGNOSIS — D500 Iron deficiency anemia secondary to blood loss (chronic): Secondary | ICD-10-CM | POA: Diagnosis not present

## 2014-12-24 DIAGNOSIS — K921 Melena: Secondary | ICD-10-CM | POA: Diagnosis not present

## 2014-12-24 DIAGNOSIS — N183 Chronic kidney disease, stage 3 (moderate): Secondary | ICD-10-CM | POA: Diagnosis not present

## 2014-12-24 DIAGNOSIS — R4182 Altered mental status, unspecified: Secondary | ICD-10-CM | POA: Diagnosis not present

## 2014-12-24 DIAGNOSIS — R0682 Tachypnea, not elsewhere classified: Secondary | ICD-10-CM | POA: Diagnosis not present

## 2014-12-24 DIAGNOSIS — K625 Hemorrhage of anus and rectum: Secondary | ICD-10-CM | POA: Diagnosis not present

## 2014-12-24 DIAGNOSIS — Z794 Long term (current) use of insulin: Secondary | ICD-10-CM | POA: Diagnosis not present

## 2014-12-24 DIAGNOSIS — K5790 Diverticulosis of intestine, part unspecified, without perforation or abscess without bleeding: Secondary | ICD-10-CM | POA: Diagnosis not present

## 2014-12-24 DIAGNOSIS — N390 Urinary tract infection, site not specified: Secondary | ICD-10-CM | POA: Diagnosis not present

## 2014-12-24 DIAGNOSIS — Z79899 Other long term (current) drug therapy: Secondary | ICD-10-CM | POA: Diagnosis not present

## 2014-12-24 DIAGNOSIS — I5032 Chronic diastolic (congestive) heart failure: Secondary | ICD-10-CM | POA: Diagnosis not present

## 2014-12-24 DIAGNOSIS — T50904A Poisoning by unspecified drugs, medicaments and biological substances, undetermined, initial encounter: Secondary | ICD-10-CM | POA: Diagnosis not present

## 2014-12-24 DIAGNOSIS — F329 Major depressive disorder, single episode, unspecified: Secondary | ICD-10-CM | POA: Diagnosis not present

## 2014-12-24 DIAGNOSIS — R262 Difficulty in walking, not elsewhere classified: Secondary | ICD-10-CM | POA: Diagnosis not present

## 2014-12-24 DIAGNOSIS — D696 Thrombocytopenia, unspecified: Secondary | ICD-10-CM | POA: Diagnosis not present

## 2014-12-24 DIAGNOSIS — Z880 Allergy status to penicillin: Secondary | ICD-10-CM | POA: Diagnosis not present

## 2014-12-24 DIAGNOSIS — K922 Gastrointestinal hemorrhage, unspecified: Secondary | ICD-10-CM | POA: Diagnosis not present

## 2014-12-24 DIAGNOSIS — I10 Essential (primary) hypertension: Secondary | ICD-10-CM | POA: Diagnosis not present

## 2014-12-24 DIAGNOSIS — Z9109 Other allergy status, other than to drugs and biological substances: Secondary | ICD-10-CM | POA: Diagnosis not present

## 2014-12-24 DIAGNOSIS — E119 Type 2 diabetes mellitus without complications: Secondary | ICD-10-CM | POA: Diagnosis not present

## 2014-12-24 DIAGNOSIS — D649 Anemia, unspecified: Secondary | ICD-10-CM | POA: Diagnosis not present

## 2014-12-24 DIAGNOSIS — Z9181 History of falling: Secondary | ICD-10-CM | POA: Diagnosis not present

## 2014-12-25 DIAGNOSIS — G40409 Other generalized epilepsy and epileptic syndromes, not intractable, without status epilepticus: Secondary | ICD-10-CM | POA: Diagnosis not present

## 2014-12-25 DIAGNOSIS — R42 Dizziness and giddiness: Secondary | ICD-10-CM | POA: Diagnosis not present

## 2014-12-25 DIAGNOSIS — R4781 Slurred speech: Secondary | ICD-10-CM | POA: Diagnosis not present

## 2014-12-25 DIAGNOSIS — R2681 Unsteadiness on feet: Secondary | ICD-10-CM | POA: Diagnosis not present

## 2014-12-26 DIAGNOSIS — N390 Urinary tract infection, site not specified: Secondary | ICD-10-CM | POA: Diagnosis not present

## 2014-12-26 DIAGNOSIS — K625 Hemorrhage of anus and rectum: Secondary | ICD-10-CM | POA: Diagnosis not present

## 2014-12-26 DIAGNOSIS — K922 Gastrointestinal hemorrhage, unspecified: Secondary | ICD-10-CM | POA: Diagnosis not present

## 2014-12-26 DIAGNOSIS — R42 Dizziness and giddiness: Secondary | ICD-10-CM | POA: Diagnosis not present

## 2014-12-27 ENCOUNTER — Other Ambulatory Visit: Payer: Self-pay | Admitting: *Deleted

## 2014-12-27 DIAGNOSIS — N390 Urinary tract infection, site not specified: Secondary | ICD-10-CM | POA: Diagnosis not present

## 2014-12-27 DIAGNOSIS — K5790 Diverticulosis of intestine, part unspecified, without perforation or abscess without bleeding: Secondary | ICD-10-CM | POA: Diagnosis not present

## 2014-12-27 DIAGNOSIS — B9620 Unspecified Escherichia coli [E. coli] as the cause of diseases classified elsewhere: Secondary | ICD-10-CM | POA: Diagnosis not present

## 2014-12-27 DIAGNOSIS — R4182 Altered mental status, unspecified: Secondary | ICD-10-CM | POA: Diagnosis not present

## 2014-12-27 NOTE — Patient Outreach (Signed)
[facility]) Care Management  12/27/2014  [name] 03-09-1932 [id number]   I received a call from the care management department at [facility] and was informed that [name] was admitted via the ED 3 days ago and was to discharge home today. He presented with altered mental status/confusion, UTI, and hematochezia. [name] also reported over the last 24 hours that he recently starting taking Ativan for anxiety and after having taken 1 without relief, he took another.   I will call [name] tomorrow to perform transition of care assessment.    [place] Management  [phone]

## 2014-12-29 ENCOUNTER — Other Ambulatory Visit: Payer: Self-pay | Admitting: *Deleted

## 2015-01-01 DIAGNOSIS — G4733 Obstructive sleep apnea (adult) (pediatric): Secondary | ICD-10-CM | POA: Diagnosis not present

## 2015-01-01 DIAGNOSIS — K5790 Diverticulosis of intestine, part unspecified, without perforation or abscess without bleeding: Secondary | ICD-10-CM | POA: Diagnosis not present

## 2015-01-01 DIAGNOSIS — I129 Hypertensive chronic kidney disease with stage 1 through stage 4 chronic kidney disease, or unspecified chronic kidney disease: Secondary | ICD-10-CM | POA: Diagnosis not present

## 2015-01-01 DIAGNOSIS — I5030 Unspecified diastolic (congestive) heart failure: Secondary | ICD-10-CM | POA: Diagnosis not present

## 2015-01-01 DIAGNOSIS — Z9981 Dependence on supplemental oxygen: Secondary | ICD-10-CM | POA: Diagnosis not present

## 2015-01-01 DIAGNOSIS — N183 Chronic kidney disease, stage 3 (moderate): Secondary | ICD-10-CM | POA: Diagnosis not present

## 2015-01-01 DIAGNOSIS — Z794 Long term (current) use of insulin: Secondary | ICD-10-CM | POA: Diagnosis not present

## 2015-01-01 DIAGNOSIS — E1122 Type 2 diabetes mellitus with diabetic chronic kidney disease: Secondary | ICD-10-CM | POA: Diagnosis not present

## 2015-01-01 DIAGNOSIS — A499 Bacterial infection, unspecified: Secondary | ICD-10-CM | POA: Diagnosis not present

## 2015-01-01 DIAGNOSIS — N390 Urinary tract infection, site not specified: Secondary | ICD-10-CM | POA: Diagnosis not present

## 2015-01-10 ENCOUNTER — Other Ambulatory Visit: Payer: Self-pay | Admitting: *Deleted

## 2015-01-10 NOTE — Patient Outreach (Signed)
[name]) Care Management   01/10/2015  [name] [birth year] [id number]  [name] is an 81 y.o. male with a PMH of morbid obesity, chronic diastolic heart failure, HTN, SVT/PSVT/PAT, CAD, DMII, OSA-CPAP, arthritis, fibromyalgia, and hyperlipidemia. [name] lives at home with his wife in [place]. He has a ramp, accessible van, accessible bathroom, and strong family support.   [facility] Care Management was consulted to assist with management of CHF after an admission for acute on chronic HF in April. [name] is being managed at the [facility] and has done fairly well with regard to Heart Failure management since this last admission.   [name]'s diabetic management is a concern in general because he is not at target for [facility] and because of his CKD. We are resuming focus on DM management now that wound is healed.   Subjective: "I'm doing good now that I got over that medicine."  Objective:  BP 142/64 mmHg  Pulse 63  Wt 336 lb (152.409 kg)  SpO2 99%  Review of Systems  Constitutional: Negative.   HENT: Positive for hearing loss. Negative for ear discharge.   Eyes: Negative.   Respiratory: Negative for sputum production, shortness of breath and wheezing.   Cardiovascular: Positive for leg swelling. Negative for chest pain.  Gastrointestinal: Negative.   Genitourinary: Positive for urgency.       Baseline urgency with intermittent mild incontinence  Musculoskeletal: Positive for myalgias. Negative for falls.  Skin: Negative.   Neurological: Negative.   Psychiatric/Behavioral: Negative.     Physical Exam  Constitutional: He is oriented to person, place, and time. Vital signs are normal. He appears well-developed and well-nourished. He is active.  Cardiovascular: Normal rate and regular rhythm.   Respiratory: Effort normal. He has no wheezes. He has no rales.  GI: Soft. Bowel sounds are normal.  Neurological: He is alert and oriented to person,  place, and time.  Skin: Skin is warm, dry and intact.     Psychiatric: He has a normal mood and affect. His behavior is normal. Judgment and thought content normal.    Current Medications:   Current Outpatient Prescriptions  Medication Sig Dispense Refill  . amLODipine (NORVASC) 10 MG tablet TAKE ONE TABLET BY MOUTH ONCE DAILY. 30 tablet 6  . aspirin 81 MG chewable tablet Chew 2 tablets (162 mg total) by mouth daily.    . benzoyl peroxide 10 % gel Apply 1 application topically 3 (three) times daily.    . calcium-vitamin D (OSCAL WITH D) 500-200 MG-UNIT per tablet Take 1 tablet by mouth 2 (two) times daily.    . cetirizine (ZYRTEC) 10 MG tablet Take 10 mg by mouth every evening.     . Cholecalciferol (D-3-5) 5000 UNITS capsule Take 5,000 Units by mouth daily.     . fluticasone (FLONASE) 50 MCG/ACT nasal spray Place 2 sprays into the nose daily.     Marland Kitchen HYDROcodone-acetaminophen (NORCO/VICODIN) 5-325 MG per tablet Take 1-2 tablets by mouth every 6 (six) hours as needed for moderate pain. (Patient taking differently: Take 1 tablet by mouth every 6 (six) hours as needed for moderate pain. ) 30 tablet 0  . insulin aspart (NOVOLOG) 100 UNIT/ML injection Inject into the skin 3 (three) times daily before meals. Sliding scale based on sugar level    . insulin detemir (LEVEMIR) 100 UNIT/ML injection Inject 0.15 mLs (15 Units total) into the skin at bedtime. (Patient taking differently: Inject 28 Units into the skin at  bedtime. ) 10 mL 12  . labetalol (NORMODYNE) 200 MG tablet Take 1 tablet (200 mg total) by mouth 2 (two) times daily. 60 tablet 1  . latanoprost (XALATAN) 0.005 % ophthalmic solution Place 1 drop into the right eye at bedtime.     . lidocaine (LIDODERM) 5 % Place 1 patch onto the skin daily as needed (for pain). Remove & Discard patch within 12 hours or as directed by MD.    . metolazone (ZAROXOLYN) 2.5 MG tablet TAKE 1 TABLET BY MOUTH TWICE PER WEEK SUNDAYS AND THURSDAYS. 8 tablet 2  .  Multiple Vitamin (MULTIVITAMIN) tablet Take 1 tablet by mouth daily.    . mupirocin ointment (BACTROBAN) 2 % Apply 1 application topically 2 (two) times daily as needed (ulcer on groin).    . potassium chloride SA (K-DUR,KLOR-CON) 20 MEQ tablet Take 2 tablets (40 mEq total) by mouth 2 (two) times daily. 120 tablet 6  . Probiotic Product (ALIGN PO) Take 1 tablet by mouth daily.    . silver nitrate applicators 26-41 % applicator Apply 1 application topically 2 (two) times daily.    . sodium chloride (OCEAN) 0.65 % SOLN nasal spray Place 1 spray into both nostrils as needed for congestion.  0  . spironolactone (ALDACTONE) 25 MG tablet Take 1 tablet (25 mg total) by mouth daily.    . Tamsulosin HCl (FLOMAX) 0.4 MG CAPS Take 0.4 mg by mouth at bedtime.     . torsemide (DEMADEX) 20 MG tablet TAKE 3 TABLETS BY MOUTH IN THE AM AND 2 TABLETS IN THE PM. 150 tablet 3  . vitamin B-12 1000 MCG tablet Take 1 tablet (1,000 mcg total) by mouth daily. 30 tablet 0   No current facility-administered medications for this visit.    Functional Status:   In your present state of health, do you have any difficulty performing the following activities: 12/04/2014 12/04/2014  Hearing? Tempie Donning  Vision? Y Y  Difficulty concentrating or making decisions? Tempie Donning  Walking or climbing stairs? Y Y  Dressing or bathing? Y Y  Doing errands, shopping? Y Y  Preparing Food and eating ? - -  Using the Toilet? - -  In the past six months, have you accidently leaked urine? - -  Do you have problems with loss of bowel control? - -  Managing your Medications? - -  Managing your Finances? - -  Housekeeping or managing your Housekeeping? - -    Fall/Depression Screening:    PHQ 2/9 Scores 12/04/2014 11/09/2014 09/08/2014  PHQ - 2 Score 0 0 0    Assessment:    Medication Non-Adherence - [name] was admitted to [facility] via the ED after taking more than prescribed dose of Ativan. He said he took 1/2 tablet as prescribed but  because he "didn't feel anything, thought he would up the dose" and took 2 tablets. He quickly experienced altered mental status. I advised that Mr. Nunley discard the medication as it has been discontinued by his provider.   Acute Health Condition (left ear drainage) - Mr. Norling's reported ear drainage (last month) is resolved   Chronic Health Condition (Stage 2 Pressure ulcer left posterior thigh/gluteal fold) - Mr. Burkhalter is being followed in the [facility] [place] at [facility] [phone]). His nurse is Office manager @ [phone]. Mr. Rabel reports today that his wound is "completely healed over". He declined to let me examine the wound today, stating the wound care center said  it was healed.  Chronic Health Condition (CHF, NYHA Class I) - Mr. Kiser was hospitalized in April for CHF exacerbation; he is being followed closely at the [facility] and is attending all scheduled appointments; Mr. Sheek daughter who is a Occupational psychologist, manages his medications/fills his pill box weekly and reviews meds multiple times weekly; Mr. Galea is NOT taking his medications as prescribed.Unfortunately, Mr. Olmeda reported to me today that he has not been taking prescribed Metolazone or Spironolactone and this is evidenced by pill count. He says he hasn't taken it because he "felt weak" after his hospitalization and thought it would make him feel weaker. He has 2-3+ peripheral edema in bilateral lower extremities R>L.  We discussed necessity of adherence to medication regimen in an effort to manage CHF.   Mr. Raczka is weighing daily and recording and can verbalize that he is to call for weight gain of 3# overnight or 5# in a week when noted. Mr. Canner admittedly has difficulty adhering to his carb modified, low sodium/heart healthy diet but his wife works hard to try and help him.    Chronic Health Condition (DMII) - Mr. Desrochers checks his cbg at least 4 times daily and tells me that he checks  it frequently during the night for fear he might have a hypoglycemic episode (having witnessed his wife have a hypoglycemic episode once, he wants to avoid this); Mr. Demeyer is taking his insulin as prescribed; as noted, he admits to having difficulty in the area of adherence to prescribed carb modified diet; he allows me to provide some dietary education but is quick to change the subject and let me know he doesn't want to discuss his diet. He has declined my offer to assist with referral to the Diabetes [facility] [place].   11/08/28 day averages are improved 199/188/204 as outlined in DM assessment. HgA1C 7 (up from 6.4 in April).   Plan:   [facility] CM Care Plan Problem One        Most Recent Value   Care Plan Problem One  No Advanced Directives in Place   Role Documenting the Problem One  Care Management [place] for Problem One  Active   [facility] Long Term Goal (31-90 days)  Over the next 31 days, patient will review provided Advanced Directives materials with spouse and son   [facility] Long Term Goal Start Date  01/10/15   [name] Long Term Goal Met Date     Interventions for Problem One Long Term Goal  Utilizing teachback method, reminded patient about A.D. packet provided at previous visit and importance of discussion with family,  agreed with patient to discuss in detail at next visit   [facility] CM Short Term Goal #1 (0-30 days)  Over the next 7 days, patient will contact provider to schedule appointment   [facility] CM Short Term Goal #1 Start Date  01/10/15   [facility] CM Short Term Goal #1 Met Date      [facility] CM Care Plan Problem Two        Most Recent Value   Care Plan Problem Two  Diabetes Management Concerns   Role Documenting the Problem Two  Care Management Coordinator   Care Plan for Problem Two  Active   Interventions for Problem Two Long Term Goal   utilizing teachback method, reviewed with patient the iportance of establishing a formal diabetes plan of care   [facility]N Long Term Goal  (31-90) days  Patient  will verbalize understanding of basic compnonents of diabetes plan of care in the next 31 days   [facility]N Long Term Goal Start Date  12/04/14 [not met,  re-established]   [facility] Long Term Goal Met Date  01/10/15 Barrie Folk not met last month,  same goal for next 31 days]   [facility] CM Short Term Goal #1 (0-30 days)  in the next 30 days, patient will verbalize understanding of HgA1C, provider goal for HgA1C, and cbg targets   [facility] CM Short Term Goal #1 Start Date  01/10/15 Barrie Folk not met last month,  re-established]   Interventions for Short Term Goal #2   utilizing teachback method and Emmi educational materials, provided information about HgA1C and CBG targets and the importance of understanding his numbers,  contacted provider office to get latest A1C data   [facility] CM Short Term Goal #2 (0-30 days)  over the next 30 days patient will verbalize basic understanding of prescribed carb modifieid diet and portion management   [facility] CM Short Term Goal #2 Start Date  01/10/15 Barrie Folk not met last month,  re-established]   Interventions for Short Term Goal #2  utilizing teachback method and [employer], reviewed prescribed carb modified diet, macronutrient defiinitions, and portion control measures    [facility] CM Care Plan Problem Three        Most Recent Value   Care Plan Problem Three  Medication Non-Adherence   Role Documenting the Problem Three  Care Management Coordinator   Care Plan for Problem Three  Active   [facility]N Long Term Goal (31-90) days  Over the next 60 days,  patient will take medications as prescribed at least 75% of the time as evidenced by pill counts   [facility]N Long Term Goal Start Date  01/10/15   [facility] Long Term Goal Met Date     Interventions for Problem Three Long Term Goal  utilizing teachback method, reviewed prescribed medication regimen with patient and reviewed rationale for adherence   [facility] CM Short Term Goal #1 (0-30 days)  Over the next 30 days, patient will resume taking  diuretics as prescribed as evidenced by patient report and pill count   [facility] CM Short Term Goal #1 Start Date  01/10/15   [facility] CM Short Term Goal #1 Met Date     Interventions for Short Term Goal #1  utilizing teachback method, reviewed importance of taking all medications as prescribed,  reviewed medications, including diuretics at length   [facility] CM Short Term Goal #2 (0-30 days)  Over the next 30 days, patient will verbalize basic understanding of each prescribed medication   [facility] CM Short Term Goal #2 Start Date  01/10/15   [facility] CM Short Term Goal #2 Met Date   [not met,  patient decided he is not going to go to wound care]   Interventions for Short Term Goal #2  Utilizing teachback method, reviewed medications and rationale for understanding medications, not only depending on spouse and daughter for medication management     I will see Mr. Delarocha at home in 2 weeks for a routine home visit.    [place] [facility]  [phone]

## 2015-01-18 DIAGNOSIS — N390 Urinary tract infection, site not specified: Secondary | ICD-10-CM | POA: Diagnosis not present

## 2015-01-18 DIAGNOSIS — K5790 Diverticulosis of intestine, part unspecified, without perforation or abscess without bleeding: Secondary | ICD-10-CM | POA: Diagnosis not present

## 2015-01-18 DIAGNOSIS — E1122 Type 2 diabetes mellitus with diabetic chronic kidney disease: Secondary | ICD-10-CM | POA: Diagnosis not present

## 2015-01-18 DIAGNOSIS — A499 Bacterial infection, unspecified: Secondary | ICD-10-CM | POA: Diagnosis not present

## 2015-01-24 ENCOUNTER — Encounter: Payer: Self-pay | Admitting: *Deleted

## 2015-01-24 ENCOUNTER — Other Ambulatory Visit: Payer: Self-pay | Admitting: *Deleted

## 2015-01-24 NOTE — Patient Outreach (Signed)
[name]) Care Management   01/24/2015  [name] 16-Aug-1931 [id number]  [name] is an 81 y.o. male  with a PMH of morbid obesity, chronic diastolic heart failure, HTN, SVT/PSVT/PAT, CAD, DMII, OSA-CPAP, arthritis, fibromyalgia, and hyperlipidemia. [name] lives at home with his wife in [place]. He has a ramp, accessible van, accessible bathroom, and strong family support.   [name] Care Management was consulted to assist with management of CHF after an admission for acute on chronic HF in April. [name] is being managed at the [facility] and has done fairly well with regard to Heart Failure management since this last admission.   [name]'s diabetic management is a concern in general because he is not at target for [facility] and because of his CKD. We are resuming focus on DM management now that wound is healed.   In addition, [name] is not adhering to agreed upon plan of care for self management of CHF. He is not weighing daily and is likely not taking his medications as prescribed 100% of the time. I am unable to review his pill bottles though as his daughter keeps his bottles and fills a pill box for him each week.   Subjective: "I'm short of breath today but I've always been that kind of person."  Objective:  BP 120/72 mmHg  Pulse 76  SpO2 96%  Review of Systems  Constitutional: Negative.   HENT: Negative.   Eyes: Negative.   Respiratory: Negative for cough and wheezing.   Cardiovascular: Positive for leg swelling.  Gastrointestinal: Negative.   Genitourinary: Negative.   Musculoskeletal: Positive for myalgias. Negative for falls.  Skin: Negative.   Neurological: Negative.   Psychiatric/Behavioral: Negative.     Physical Exam  Constitutional: He is oriented to person, place, and time. Vital signs are normal. He appears well-developed and well-nourished. He is active.  Cardiovascular: Normal rate and regular rhythm.   Respiratory:  Effort normal. No respiratory distress. He has no wheezes. He has no rales.  GI: Soft. Bowel sounds are normal.  Neurological: He is alert and oriented to person, place, and time.  Skin: Skin is warm, dry and intact.     Psychiatric: He has a normal mood and affect. His speech is normal and behavior is normal. Judgment and thought content normal. Cognition and memory are normal.    Current Medications:   Current Outpatient Prescriptions  Medication Sig Dispense Refill  . amLODipine (NORVASC) 10 MG tablet TAKE ONE TABLET BY MOUTH ONCE DAILY. 30 tablet 6  . aspirin 81 MG chewable tablet Chew 2 tablets (162 mg total) by mouth daily.    . benzoyl peroxide 10 % gel Apply 1 application topically 3 (three) times daily.    . calcium-vitamin D (OSCAL WITH D) 500-200 MG-UNIT per tablet Take 1 tablet by mouth 2 (two) times daily.    . cetirizine (ZYRTEC) 10 MG tablet Take 10 mg by mouth every evening.     . Cholecalciferol (D-3-5) 5000 UNITS capsule Take 5,000 Units by mouth daily.     . fluticasone (FLONASE) 50 MCG/ACT nasal spray Place 2 sprays into the nose daily.     Marland Kitchen HYDROcodone-acetaminophen (NORCO/VICODIN) 5-325 MG per tablet Take 1-2 tablets by mouth every 6 (six) hours as needed for moderate pain. (Patient taking differently: Take 1 tablet by mouth every 6 (six) hours as needed for moderate pain. ) 30 tablet 0  . insulin aspart (NOVOLOG) 100 UNIT/ML injection Inject into the  skin 3 (three) times daily before meals. Sliding scale based on sugar level    . insulin detemir (LEVEMIR) 100 UNIT/ML injection Inject 0.15 mLs (15 Units total) into the skin at bedtime. (Patient taking differently: Inject 28 Units into the skin at bedtime. ) 10 mL 12  . labetalol (NORMODYNE) 200 MG tablet Take 1 tablet (200 mg total) by mouth 2 (two) times daily. 60 tablet 1  . latanoprost (XALATAN) 0.005 % ophthalmic solution Place 1 drop into the right eye at bedtime.     . lidocaine (LIDODERM) 5 % Place 1 patch onto  the skin daily as needed (for pain). Remove & Discard patch within 12 hours or as directed by MD.    . metolazone (ZAROXOLYN) 2.5 MG tablet TAKE 1 TABLET BY MOUTH TWICE PER WEEK SUNDAYS AND THURSDAYS. (Patient not taking: Reported on 01/10/2015) 8 tablet 2  . Multiple Vitamin (MULTIVITAMIN) tablet Take 1 tablet by mouth daily.    . mupirocin ointment (BACTROBAN) 2 % Apply 1 application topically 2 (two) times daily as needed (ulcer on groin).    . potassium chloride SA (K-DUR,KLOR-CON) 20 MEQ tablet Take 2 tablets (40 mEq total) by mouth 2 (two) times daily. 120 tablet 6  . Probiotic Product (ALIGN PO) Take 1 tablet by mouth daily.    . silver nitrate applicators 40-34 % applicator Apply 1 application topically 2 (two) times daily.    . sodium chloride (OCEAN) 0.65 % SOLN nasal spray Place 1 spray into both nostrils as needed for congestion.  0  . spironolactone (ALDACTONE) 25 MG tablet Take 1 tablet (25 mg total) by mouth daily.    . Tamsulosin HCl (FLOMAX) 0.4 MG CAPS Take 0.4 mg by mouth at bedtime.     . torsemide (DEMADEX) 20 MG tablet TAKE 3 TABLETS BY MOUTH IN THE AM AND 2 TABLETS IN THE PM. 150 tablet 3  . vitamin B-12 1000 MCG tablet Take 1 tablet (1,000 mcg total) by mouth daily. 30 tablet 0   No current facility-administered medications for this visit.    Functional Status:   In your present state of health, do you have any difficulty performing the following activities: 12/04/2014 12/04/2014  Hearing? Tempie Donning  Vision? Y Y  Difficulty concentrating or making decisions? Tempie Donning  Walking or climbing stairs? Y Y  Dressing or bathing? Y Y  Doing errands, shopping? Y Y  Preparing Food and eating ? - -  Using the Toilet? - -  In the past six months, have you accidently leaked urine? - -  Do you have problems with loss of bowel control? - -  Managing your Medications? - -  Managing your Finances? - -  Housekeeping or managing your Housekeeping? - -    Fall/Depression Screening:    PHQ 2/9  Scores 12/04/2014 11/09/2014 09/08/2014  PHQ - 2 Score 0 0 0    Assessment:    Medication Non-Adherence - [name] was admitted to [facility] via the ED last month after taking more than prescribed dose of Ativan. He was discharged from the hospital and has discarded that medication.    Chronic Health Condition (Stage 2 Pressure ulcer left posterior thigh/gluteal fold)  - RESOLVED  Chronic Health Condition (CHF, NYHA Class I) - [name] was hospitalized in April for CHF exacerbation; [name] daughter who is a Occupational psychologist, manages his medications/fills his pill box weekly and reviews meds multiple times weekly; [name] is NOT taking his medications as prescribed. He  has 3+ peripheral edema in bilateral lower extremities. We discussed necessity of adherence to medication regimen in an effort to manage CHF. [name] is NOT weighing daily and recording. When I tried to discuss the importance of weighing daily and reporting, he continually changed the subject and would not discuss this with me. [name] admittedly has difficulty adhering to his carb modified, low sodium/heart healthy diet but his wife works hard to try and help him.    Chronic Health Condition (DMII) - [name] checks his cbg at least 4 times daily and tells me that he checks it frequently during the night for fear he might have a hypoglycemic episode (having witnessed his wife have a hypoglycemic episode once, he wants to avoid this); [name] is taking his insulin as prescribed; as noted, he admits to having difficulty in the area of adherence to prescribed carb modified diet; he allows me to provide some dietary education but is quick to change the subject and let me know he doesn't want to discuss his diet. He has declined my offer to assist with referral to the Diabetes and [place].   11/08/28 day averages are slightly worsened today as outlined in DM assessment. HgA1C 7 (up from 6.4 in April).     Plan:   [facility] CM Care Plan Problem One        Most Recent Value   Care Plan Problem One  No Advanced Directives in Place   Role Documenting the Problem One  Care Management [place] for Problem One  Active   [facility] Long Term Goal (31-90 days)  Over the next 31 days, patient will review provided Advanced Directives materials with spouse and son   [facility] Long Term Goal Start Date  01/10/15   Interventions for Problem One Long Term Goal  Utilizing teachback method, reminded patient about A.D. packet provided at previous visit and importance of discussion with family,  agreed with patient to discuss in detail at next visit    [facility] CM Care Plan Problem Two        Most Recent Value   Care Plan Problem Two  Diabetes Management Concerns   Role Documenting the Problem Two  Care Management [place] for Problem Two  Active   Interventions for Problem Two Long Term Goal   utilizing teachback method, reviewed with patient the iportance of establishing a formal diabetes plan of care   [facility] Long Term Goal (31-90) days  Patient will verbalize understanding of basic compnonents of diabetes plan of care in the next 31 days   [facility] Long Term Goal Start Date  12/04/14 [not met,  re-established]   [facility] Long Term Goal Met Date  01/10/15 [name] not met last month,  same goal for next 31 days]   [facility] CM Short Term Goal #1 (0-30 days)  in the next 30 days, patient will verbalize understanding of HgA1C, provider goal for HgA1C, and cbg targets   [facility] CM Short Term Goal #1 Start Date  01/10/15 [name] not met last month,  re-established]   Interventions for Short Term Goal #2   utilizing teachback method and Emmi educational materials, provided information about HgA1C and CBG targets and the importance of understanding his numbers,  contacted provider office to get latest A1C data   [facility] CM Short Term Goal #2 (0-30 days)  over the next 30 days patient will verbalize basic understanding of prescribed carb  modifieid diet and  portion management   [facility] CM Short Term Goal #2 Start Date  01/10/15 [name] not met last month,  re-established]   Interventions for Short Term Goal #2  utilizing teachback method and Emmi educational materials, reviewed prescribed carb modified diet, macronutrient defiinitions, and portion control measures    [facility] CM Care Plan Problem Three        Most Recent Value   Care Plan Problem Three  Medication Non-Adherence   Role Documenting the Problem Three  Care Management Coordinator   Care Plan for Problem Three  Active   [facility] Long Term Goal (31-90) days  Over the next 60 days,  patient will take medications as prescribed at least 75% of the time as evidenced by pill counts   [facility] Long Term Goal Start Date  01/10/15   Interventions for Problem Three Long Term Goal  utilizing teachback method, reviewed prescribed medication regimen with patient and reviewed rationale for adherence   [facility] CM Short Term Goal #1 (0-30 days)  Over the next 30 days, patient will resume taking diuretics as prescribed as evidenced by patient report and pill count   [facility] CM Short Term Goal #1 Start Date  01/10/15   Interventions for Short Term Goal #1  utilizing teachback method, reviewed importance of taking all medications as prescribed,  reviewed medications, including diuretics at length   [facility] CM Short Term Goal #2 (0-30 days)  Over the next 30 days, patient will verbalize basic understanding of each prescribed medication   [facility] CM Short Term Goal #2 Start Date  01/10/15   Interventions for Short Term Goal #2  Utilizing teachback method, reviewed medications and rationale for understanding medications, not only depending on spouse and daughter for medication management      [place] Management  [phone]

## 2015-01-25 ENCOUNTER — Ambulatory Visit: Payer: Self-pay | Admitting: *Deleted

## 2015-01-31 ENCOUNTER — Other Ambulatory Visit: Payer: Self-pay | Admitting: *Deleted

## 2015-01-31 DIAGNOSIS — I129 Hypertensive chronic kidney disease with stage 1 through stage 4 chronic kidney disease, or unspecified chronic kidney disease: Secondary | ICD-10-CM | POA: Diagnosis not present

## 2015-01-31 DIAGNOSIS — N2581 Secondary hyperparathyroidism of renal origin: Secondary | ICD-10-CM | POA: Diagnosis not present

## 2015-01-31 DIAGNOSIS — E1122 Type 2 diabetes mellitus with diabetic chronic kidney disease: Secondary | ICD-10-CM | POA: Diagnosis not present

## 2015-01-31 DIAGNOSIS — Z23 Encounter for immunization: Secondary | ICD-10-CM | POA: Diagnosis not present

## 2015-01-31 DIAGNOSIS — N184 Chronic kidney disease, stage 4 (severe): Secondary | ICD-10-CM | POA: Diagnosis not present

## 2015-01-31 DIAGNOSIS — D631 Anemia in chronic kidney disease: Secondary | ICD-10-CM | POA: Diagnosis not present

## 2015-01-31 NOTE — Patient Outreach (Signed)
[facility]) Care Management  01/31/2015  [name] 04/05/1932 [id number]  Call received from [name] today after his appointment with [name], Nephrology. [name] wanted me to have [name] contact information and asked that I speak with [name] so we could collaborate regarding [name] nephrology care.   I also received a call from [name] who explained that he'd spoken with [name] about his CKD and offered information about usual progression of disease and possible choices [name] may have to make over the course of the next year (Dialysis vs medical treatment which could eventually include hospice care).   Plan: I will meet with [name] at our next scheduled home visit on 02/12/15 at 11:30am. I will prepare print literature with information about CKD and Dialysis and have allotted 1 full hour for our visit. [name] is to ask his son and/or daughter to attend the visit.   [place] Management  [phone]

## 2015-02-01 ENCOUNTER — Telehealth: Payer: Self-pay | Admitting: Cardiology

## 2015-02-01 NOTE — Telephone Encounter (Signed)
Patient wanted to let MD know that he went to [facility] for overdosing on a pill but wasn't able to tell nurse which pill it was. Will await patient to call back with the name of the medication that he overdosed on.

## 2015-02-01 NOTE — Telephone Encounter (Signed)
Medication question about his dose He accidentally overdosed on medication and to [facility] about two weeks ago

## 2015-02-05 ENCOUNTER — Other Ambulatory Visit: Payer: Self-pay | Admitting: Nephrology

## 2015-02-05 DIAGNOSIS — R809 Proteinuria, unspecified: Secondary | ICD-10-CM

## 2015-02-05 DIAGNOSIS — N184 Chronic kidney disease, stage 4 (severe): Secondary | ICD-10-CM

## 2015-02-05 DIAGNOSIS — N2581 Secondary hyperparathyroidism of renal origin: Secondary | ICD-10-CM

## 2015-02-05 DIAGNOSIS — N189 Chronic kidney disease, unspecified: Secondary | ICD-10-CM

## 2015-02-05 DIAGNOSIS — D631 Anemia in chronic kidney disease: Secondary | ICD-10-CM

## 2015-02-12 ENCOUNTER — Other Ambulatory Visit: Payer: Self-pay | Admitting: *Deleted

## 2015-02-12 NOTE — Patient Outreach (Signed)
[name]) Care Management   02/12/2015  [name] 09/21/1931 [id number]  [name] is an 81 y.o. male with a PMH of morbid obesity, chronic diastolic heart failure, HTN, SVT/PSVT/PAT, CAD, DMII, OSA-CPAP, arthritis, fibromyalgia, and hyperlipidemia. [name] lives at home with his wife in [place]. He has a ramp, accessible van, accessible bathroom, and strong family support.   [facility] Care Management was consulted to assist with management of CHF after an admission for acute on chronic HF in April. [name] was seen briefly at the [facility] but did not keep his subsequent appointments. Until recently, volume overload has not been a significant issue.   [name]'s diabetic management is a concern in general because he is not at target for [facility] and because of his CKD.    [name] had an office visit with Dr. Edrick Oh who explained to [name] about the progression of his CKD and offered information about usual progression of disease and possible choices [name] may have to make over the course of the next year (Dialysis vs medical treatment which could eventually include hospice care).   [name] has not been adherent to the prescribed plan of care for self management of CHF. He has only started weighing himself daily in the last month, after several months of discussion/education/support from his providers and our team.   [name] is likely not taking his medications as prescribed 100% of the time. I am unable to review his pill bottles though as his daughter, who works in the [name] [employer] at [place], keeps his bottles and fills a pill box for him each week.   His daughter and son have been very engaged in [name]'s care but have both expressed to me that he "follows orders only when he wants to." His family has remained very supportive and attentive.    Subjective: "I don't know what to think about this news about my kidneys. I'm  just not sure about it."  Objective: BP 172/75 mmHg  Pulse 82  Wt 354 lb (160.573 kg)  SpO2 95%  Review of Systems  Constitutional: Positive for malaise/fatigue.       Generalized weakness/malaise consistent with baseline  HENT: Negative.   Eyes: Negative.        Blindness left eye secondary to industrial accident many years ago  Respiratory: Positive for shortness of breath. Negative for wheezing.        Short of breath with conversation as baseline  Cardiovascular: Positive for leg swelling.       1-2+ edema RLE pre-tibial to ankle 3+ edema LLE pre-tibial to lower calf  Gastrointestinal: Negative.   Genitourinary: Negative.   Musculoskeletal: Positive for myalgias. Negative for falls.  Skin:       Patient reports "skin tags" that are "bothering me a little bit" - declined examination  Neurological: Positive for weakness. Negative for dizziness, tingling and focal weakness.  Psychiatric/Behavioral:       Patient's demeanor is changed today; he is somewhat flat, not as conversant, not smiling; he says he is concerned about he news concerning his kidney function and having to make a decision in the near future regarding dialysis    Physical Exam  Constitutional: He is oriented to person, place, and time. Vital signs are normal. He appears well-developed and well-nourished. He appears lethargic.  Cardiovascular: Normal rate, regular rhythm, S1 normal and S2 normal.  Exam reveals distant heart sounds. Exam reveals no  friction rub and no decreased pulses.   No murmur heard. Respiratory: No respiratory distress. He has decreased breath sounds in the right middle field, the right lower field, the left middle field and the left lower field. He has no wheezes. He has no rhonchi. He has no rales.  GI: Soft. Bowel sounds are normal.  Neurological: He is oriented to person, place, and time. He appears lethargic.  Skin: Skin is warm, dry and intact.  Psychiatric: His speech is normal and  behavior is normal. Judgment and thought content normal. Cognition and memory are normal.    Current Medications:   Current Outpatient Prescriptions  Medication Sig Dispense Refill  . amLODipine (NORVASC) 10 MG tablet TAKE ONE TABLET BY MOUTH ONCE DAILY. 30 tablet 6  . aspirin 81 MG chewable tablet Chew 2 tablets (162 mg total) by mouth daily.    . benzoyl peroxide 10 % gel Apply 1 application topically 3 (three) times daily.    . calcium-vitamin D (OSCAL WITH D) 500-200 MG-UNIT per tablet Take 1 tablet by mouth 2 (two) times daily.    . cetirizine (ZYRTEC) 10 MG tablet Take 10 mg by mouth every evening.     . Cholecalciferol (D-3-5) 5000 UNITS capsule Take 5,000 Units by mouth daily.     . fluticasone (FLONASE) 50 MCG/ACT nasal spray Place 2 sprays into the nose daily.     Marland Kitchen HYDROcodone-acetaminophen (NORCO/VICODIN) 5-325 MG per tablet Take 1-2 tablets by mouth every 6 (six) hours as needed for moderate pain. 30 tablet 0  . insulin aspart (NOVOLOG) 100 UNIT/ML injection Inject into the skin 3 (three) times daily before meals. Sliding scale based on sugar level    . insulin detemir (LEVEMIR) 100 UNIT/ML injection Inject 0.15 mLs (15 Units total) into the skin at bedtime. (Patient taking differently: Inject 28 Units into the skin at bedtime. ) 10 mL 12  . labetalol (NORMODYNE) 200 MG tablet Take 1 tablet (200 mg total) by mouth 2 (two) times daily. 60 tablet 1  . latanoprost (XALATAN) 0.005 % ophthalmic solution Place 1 drop into the right eye at bedtime.     . lidocaine (LIDODERM) 5 % Place 1 patch onto the skin daily as needed (for pain). Remove & Discard patch within 12 hours or as directed by MD.    . metolazone (ZAROXOLYN) 2.5 MG tablet TAKE 1 TABLET BY MOUTH TWICE PER WEEK SUNDAYS AND THURSDAYS. 8 tablet 2  . Multiple Vitamin (MULTIVITAMIN) tablet Take 1 tablet by mouth daily.    . mupirocin ointment (BACTROBAN) 2 % Apply 1 application topically 2 (two) times daily as needed (ulcer on  groin).    . potassium chloride SA (K-DUR,KLOR-CON) 20 MEQ tablet Take 2 tablets (40 mEq total) by mouth 2 (two) times daily. 120 tablet 6  . Probiotic Product (ALIGN PO) Take 1 tablet by mouth daily.    . silver nitrate applicators 21-62 % applicator Apply 1 application topically 2 (two) times daily.    . sodium chloride (OCEAN) 0.65 % SOLN nasal spray Place 1 spray into both nostrils as needed for congestion.  0  . spironolactone (ALDACTONE) 25 MG tablet Take 1 tablet (25 mg total) by mouth daily.    . Tamsulosin HCl (FLOMAX) 0.4 MG CAPS Take 0.4 mg by mouth at bedtime.     . torsemide (DEMADEX) 20 MG tablet TAKE 3 TABLETS BY MOUTH IN THE AM AND 2 TABLETS IN THE PM. 150 tablet 3  . vitamin B-12 1000 MCG tablet  Take 1 tablet (1,000 mcg total) by mouth daily. 30 tablet 0   Assessment:    Chronic Health Condition (progressing CKD) - [name] met with his nephrologist a few weeks ago and was told he needed to consider whether he would want to have dialysis in the future as his kidney function is deteriorating. [name] and I spent the better part of an hour today, with his wife, talking about what "kidney failure" means, what happens when someone chooses dialysis, what choices are available to a person who has ESRD and chooses not to have dialysis including palliative care and eventually hospice.   [name] had lots of questions about what he could do to "slow down the kidney disease". We had a lot of discussion about general self care and management of his diabetes and CHF. I provided print materials to [name] for his review and to use for discussion with his family.   Chronic Health Condition (CHF, NYHA Class I) - [name] was hospitalized in April for CHF exacerbation; [name] daughter who is a Occupational psychologist, manages his medications/fills his pill box weekly and reviews meds multiple times weekly; Mr. Furey is NOT taking his medications as prescribed 100% of the time. He has 3+ peripheral  edema of LLE and 2+ peripheral edema of RLE. We discussed necessity of adherence to medication regimen in an effort to manage CHF. Mr. Leighton has started weighing daily and recording. He has shortness of breath with conversation at baseline.    Chronic Health Condition (DMII) - Mr. Tinnon checks his cbg at least 4 times daily and tells me that he checks it frequently during the night for fear he might have a hypoglycemic episode (having witnessed his wife have a hypoglycemic episode once, he wants to avoid this); Mr. Selner is taking his insulin as prescribed; as noted, he admits to having difficulty in the area of adherence to prescribed carb modified diet; he allows me to provide some dietary education but is quick to change the subject and let me know he doesn't want to discuss his diet. He has declined my offer to assist with referral to the Diabetes and [place].   11/08/28 day averages are slightly improved today as outlined in DM assessment. HgA1C 7 (up from 6.4 in April).    Plan:   Mr. Knipple will attend his hematology appointment on 10/31. He was considering cancelling the appointment and lost the appointment information. I wrote down the needed information and he committed to going to the appointment.  Mr. Samons will review materials provided about kidney disease, dialysis, advanced directives with his family.   Mr. Donaghey will check cbg's and weights daily and record, calling for findings outside parameters established by his providers.   Mr. Muckey will continue taking all medications as prescribed.   I will see Mr. Foutz at home next month for routine home visit and will see him as needed in the interim.   [facility] CM Care Plan Problem One        Most Recent Value   Care Plan Problem One  No Advanced Directives in Place   Role Documenting the Problem One  Care Management [place] for Problem One  Active   THN Long Term Goal (31-90 days)  Over the next  31 days, patient will review provided Advanced Directives materials with spouse and son   [facility] Long Term Goal Start Date  02/12/15 Barrie Folk not met - reset  goal]   Interventions for Problem One Long Term Goal  Utilizing teachback method, reminded patient about A.D. packet provided at previous visit and importance of discussion with family,  agreed with patient to discuss in detail at next visit   [facility]N CM Short Term Goal #1 (0-30 days)  Over the next 14 days, patient and spouse and other family members will review educational materials related to CKD progression and goals of care   [name] CM Short Term Goal #1 Start Date  02/12/15 Barrie Folk not met last month - reset]   Interventions for Short Term Goal #1  discussion with nephrologist and patient regarding nephrology appointment and needed education    [facility] CM Care Plan Problem Two        Most Recent Value   Care Plan Problem Two  Diabetes Management Concerns   Role Documenting the Problem Two  Care Management Coordinator   Care Plan for Problem Two  Active   Interventions for Problem Two Long Term Goal   utilizing teachback method, reviewed with patient the iportance of establishing a formal diabetes plan of care   THN Long Term Goal (31-90) days  Patient will verbalize understanding of basic compnonents of diabetes plan of care in the next 31 days   [facility]N Long Term Goal Start Date  12/04/14 [not met,  re-established]   [facility]N Long Term Goal Met Date  01/10/15 Barrie Folk not met last month,  same goal for next 31 days]   [facility]N CM Short Term Goal #1 (0-30 days)  in the next 30 days, patient will verbalize understanding of HgA1C, provider goal for HgA1C, and cbg targets   [facility]N CM Short Term Goal #1 Start Date  02/12/15 Barrie Folk not met last month,  re-established]   Interventions for Short Term Goal #2   utilizing teachback method and Emmi educational materials, provided information about HgA1C and CBG targets and the importance of understanding his numbers,  contacted provider  office to get latest A1C data   [facility]N CM Short Term Goal #2 (0-30 days)  over the next 30 days patient will verbalize basic understanding of prescribed carb modifieid diet and portion management   [facility] CM Short Term Goal #2 Start Date  02/12/15 Barrie Folk not met last month,  re-established]   [facility]N CM Short Term Goal #2 Met Date     Interventions for Short Term Goal #2  utilizing teachback method and Emmi educational materials, reviewed prescribed carb modified diet, macronutrient defiinitions, and portion control measures    [facility]N CM Care Plan Problem Three        Most Recent Value   Care Plan Problem Three  Medication Non-Adherence   Role Documenting the Problem Three  Care Management Coordinator   Care Plan for Problem Three  Active   THN Long Term Goal (31-90) days  Over the next 60 days,  patient will take medications as prescribed at least 75% of the time as evidenced by pill counts   [facility]N Long Term Goal Start Date  01/10/15   [facility] Long Term Goal Met Date  02/12/15   Interventions for Problem Three Long Term Goal  utilizing teachback method, reviewed prescribed medication regimen with patient and reviewed rationale for adherence   [facility]N CM Short Term Goal #1 (0-30 days)  Over the next 30 days, patient will resume taking diuretics as prescribed as evidenced by patient report and pill count   [facility] CM Short Term Goal #1 Start Date  01/10/15   [facility] CM Short  Term Goal #1 Met Date  02/12/15   Interventions for Short Term Goal #1  utilizing teachback method, reviewed importance of taking all medications as prescribed,  reviewed medications, including diuretics at length   [facility]N CM Short Term Goal #2 (0-30 days)  Over the next 30 days, patient will verbalize basic understanding of each prescribed medication   [facility]N CM Short Term Goal #2 Start Date  02/12/15 [not met - goal reestablished]   Interventions for Short Term Goal #2  Utilizing teachback method, reviewed medications and rationale for understanding  medications, not only depending on spouse and daughter for medication management      [place] Management  [phone]

## 2015-02-22 ENCOUNTER — Other Ambulatory Visit: Payer: Self-pay | Admitting: *Deleted

## 2015-02-22 NOTE — Patient Outreach (Signed)
[facility]) Care Management  02/22/2015  PHILIPE LASWELL 02/11/1932 [id number]   Mr. Noblett called because he was confused about his upcoming appointments. I confirmed with him his new patient appointment with Dr. Sullivan Lone, Medical Oncology on Monday 02/26/15 at [place] Management  [phone]

## 2015-02-26 ENCOUNTER — Ambulatory Visit (HOSPITAL_BASED_OUTPATIENT_CLINIC_OR_DEPARTMENT_OTHER): Payer: Medicare Other | Admitting: Hematology

## 2015-02-26 ENCOUNTER — Other Ambulatory Visit (HOSPITAL_BASED_OUTPATIENT_CLINIC_OR_DEPARTMENT_OTHER): Payer: Medicare Other

## 2015-02-26 ENCOUNTER — Encounter: Payer: Self-pay | Admitting: Hematology

## 2015-02-26 ENCOUNTER — Telehealth: Payer: Self-pay | Admitting: Hematology

## 2015-02-26 VITALS — BP 135/67 | HR 69 | Temp 98.2°F | Resp 18 | Ht 72.0 in | Wt 367.3 lb

## 2015-02-26 DIAGNOSIS — D649 Anemia, unspecified: Secondary | ICD-10-CM

## 2015-02-26 DIAGNOSIS — I2510 Atherosclerotic heart disease of native coronary artery without angina pectoris: Secondary | ICD-10-CM

## 2015-02-26 DIAGNOSIS — D61818 Other pancytopenia: Secondary | ICD-10-CM

## 2015-02-26 LAB — IRON AND TIBC CHCC
%SAT: 11 % — AB (ref 20–55)
Iron: 41 ug/dL — ABNORMAL LOW (ref 42–163)
TIBC: 379 ug/dL (ref 202–409)
UIBC: 338 ug/dL (ref 117–376)

## 2015-02-26 LAB — CBC & DIFF AND RETIC
BASO%: 0.7 % (ref 0.0–2.0)
Basophils Absolute: 0 10*3/uL (ref 0.0–0.1)
EOS%: 6.1 % (ref 0.0–7.0)
Eosinophils Absolute: 0.3 10*3/uL (ref 0.0–0.5)
HCT: 26.9 % — ABNORMAL LOW (ref 38.4–49.9)
HGB: 8.3 g/dL — ABNORMAL LOW (ref 13.0–17.1)
Immature Retic Fract: 16.2 % — ABNORMAL HIGH (ref 3.00–10.60)
LYMPH%: 16.7 % (ref 14.0–49.0)
MCH: 27 pg — ABNORMAL LOW (ref 27.2–33.4)
MCHC: 30.9 g/dL — AB (ref 32.0–36.0)
MCV: 87.6 fL (ref 79.3–98.0)
MONO#: 0.7 10*3/uL (ref 0.1–0.9)
MONO%: 18.1 % — AB (ref 0.0–14.0)
NEUT%: 58.4 % (ref 39.0–75.0)
NEUTROS ABS: 2.4 10*3/uL (ref 1.5–6.5)
NRBC: 0 % (ref 0–0)
PLATELETS: 100 10*3/uL — AB (ref 140–400)
RBC: 3.07 10*6/uL — AB (ref 4.20–5.82)
RDW: 17.1 % — AB (ref 11.0–14.6)
Retic %: 2.32 % — ABNORMAL HIGH (ref 0.80–1.80)
Retic Ct Abs: 71.22 10*3/uL (ref 34.80–93.90)
WBC: 4.1 10*3/uL (ref 4.0–10.3)
lymph#: 0.7 10*3/uL — ABNORMAL LOW (ref 0.9–3.3)

## 2015-02-26 LAB — COMPREHENSIVE METABOLIC PANEL (CC13)
ALT: 12 U/L (ref 0–55)
ANION GAP: 11 meq/L (ref 3–11)
AST: 16 U/L (ref 5–34)
Albumin: 3.7 g/dL (ref 3.5–5.0)
Alkaline Phosphatase: 77 U/L (ref 40–150)
BILIRUBIN TOTAL: 1.15 mg/dL (ref 0.20–1.20)
BUN: 48.4 mg/dL — ABNORMAL HIGH (ref 7.0–26.0)
CHLORIDE: 107 meq/L (ref 98–109)
CO2: 23 meq/L (ref 22–29)
Calcium: 9.5 mg/dL (ref 8.4–10.4)
Creatinine: 2.3 mg/dL — ABNORMAL HIGH (ref 0.7–1.3)
EGFR: 25 mL/min/{1.73_m2} — AB (ref >90)
Glucose: 126 mg/dl (ref 70–140)
POTASSIUM: 4.1 meq/L (ref 3.5–5.1)
Sodium: 141 mEq/L (ref 136–145)
Total Protein: 6.5 g/dL (ref 6.4–8.3)

## 2015-02-26 LAB — FERRITIN CHCC: Ferritin: 27 ng/ml (ref 22–316)

## 2015-02-26 LAB — CHCC SMEAR

## 2015-02-26 LAB — LACTATE DEHYDROGENASE (CC13): LDH: 237 U/L (ref 125–245)

## 2015-02-26 NOTE — Telephone Encounter (Signed)
per pof to sch pt appt-cld & left pt a message to adv of time & date of appt-adv to call [facility] imaging @[phone] to do screening and get [place] scheduled
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
## 2015-02-28 ENCOUNTER — Telehealth: Payer: Self-pay | Admitting: Hematology

## 2015-02-28 LAB — SPEP & IFE WITH QIG
ALBUMIN ELP: 3.6 g/dL — AB (ref 3.8–4.8)
ALPHA-2-GLOBULIN: 0.7 g/dL (ref 0.5–0.9)
Alpha-1-Globulin: 0.4 g/dL — ABNORMAL HIGH (ref 0.2–0.3)
Beta 2: 0.3 g/dL (ref 0.2–0.5)
Beta Globulin: 0.5 g/dL (ref 0.4–0.6)
Gamma Globulin: 0.8 g/dL (ref 0.8–1.7)
IGG (IMMUNOGLOBIN G), SERUM: 779 mg/dL (ref 650–1600)
IgA: 269 mg/dL (ref 68–379)
IgM, Serum: 35 mg/dL — ABNORMAL LOW (ref 41–251)
TOTAL PROTEIN, SERUM ELECTROPHOR: 6.2 g/dL (ref 6.1–8.1)

## 2015-02-28 LAB — KAPPA/LAMBDA LIGHT CHAINS
Kappa free light chain: 5.82 mg/dL — ABNORMAL HIGH (ref 0.33–1.94)
Kappa:Lambda Ratio: 1.53 (ref 0.26–1.65)
Lambda Free Lght Chn: 3.8 mg/dL — ABNORMAL HIGH (ref 0.57–2.63)

## 2015-02-28 LAB — C-REACTIVE PROTEIN: CRP: 2.5 mg/dL — AB (ref <0.60)

## 2015-02-28 LAB — COPPER, SERUM: Copper: 115 ug/dL (ref 70–175)

## 2015-02-28 LAB — SEDIMENTATION RATE: SED RATE: 34 mm/h — AB (ref 0–20)

## 2015-02-28 NOTE — Telephone Encounter (Signed)
FAXED LABS TO PCP

## 2015-03-15 ENCOUNTER — Ambulatory Visit (HOSPITAL_COMMUNITY): Admission: RE | Admit: 2015-03-15 | Payer: Medicare Other | Source: Ambulatory Visit

## 2015-03-15 ENCOUNTER — Ambulatory Visit: Payer: Medicare Other | Admitting: *Deleted

## 2015-03-19 ENCOUNTER — Ambulatory Visit: Payer: Medicare Other | Admitting: Cardiology

## 2015-03-21 ENCOUNTER — Other Ambulatory Visit (HOSPITAL_COMMUNITY): Payer: Self-pay | Admitting: *Deleted

## 2015-03-22 ENCOUNTER — Inpatient Hospital Stay (HOSPITAL_COMMUNITY)
Admission: EM | Admit: 2015-03-22 | Discharge: 2015-03-26 | DRG: 683 | Disposition: A | Discharge status: Skilled Nursing Facility | Payer: Medicare Other | Attending: Internal Medicine | Admitting: Internal Medicine

## 2015-03-22 ENCOUNTER — Emergency Department (HOSPITAL_COMMUNITY): Payer: Medicare Other

## 2015-03-22 ENCOUNTER — Encounter (HOSPITAL_COMMUNITY): Payer: Self-pay

## 2015-03-22 DIAGNOSIS — I130 Hypertensive heart and chronic kidney disease with heart failure and stage 1 through stage 4 chronic kidney disease, or unspecified chronic kidney disease: Secondary | ICD-10-CM | POA: Diagnosis present

## 2015-03-22 DIAGNOSIS — D61818 Other pancytopenia: Secondary | ICD-10-CM

## 2015-03-22 DIAGNOSIS — E785 Hyperlipidemia, unspecified: Secondary | ICD-10-CM | POA: Diagnosis present

## 2015-03-22 DIAGNOSIS — G4733 Obstructive sleep apnea (adult) (pediatric): Secondary | ICD-10-CM | POA: Diagnosis present

## 2015-03-22 DIAGNOSIS — R0602 Shortness of breath: Secondary | ICD-10-CM | POA: Diagnosis not present

## 2015-03-22 DIAGNOSIS — E1122 Type 2 diabetes mellitus with diabetic chronic kidney disease: Secondary | ICD-10-CM | POA: Diagnosis present

## 2015-03-22 DIAGNOSIS — I10 Essential (primary) hypertension: Secondary | ICD-10-CM | POA: Diagnosis present

## 2015-03-22 DIAGNOSIS — I2510 Atherosclerotic heart disease of native coronary artery without angina pectoris: Secondary | ICD-10-CM | POA: Diagnosis present

## 2015-03-22 DIAGNOSIS — I5032 Chronic diastolic (congestive) heart failure: Secondary | ICD-10-CM | POA: Diagnosis present

## 2015-03-22 DIAGNOSIS — D696 Thrombocytopenia, unspecified: Secondary | ICD-10-CM | POA: Diagnosis present

## 2015-03-22 DIAGNOSIS — Z7982 Long term (current) use of aspirin: Secondary | ICD-10-CM

## 2015-03-22 DIAGNOSIS — R195 Other fecal abnormalities: Secondary | ICD-10-CM | POA: Diagnosis present

## 2015-03-22 DIAGNOSIS — I872 Venous insufficiency (chronic) (peripheral): Secondary | ICD-10-CM | POA: Diagnosis present

## 2015-03-22 DIAGNOSIS — I5042 Chronic combined systolic (congestive) and diastolic (congestive) heart failure: Secondary | ICD-10-CM | POA: Diagnosis present

## 2015-03-22 DIAGNOSIS — Z880 Allergy status to penicillin: Secondary | ICD-10-CM | POA: Diagnosis not present

## 2015-03-22 DIAGNOSIS — R4181 Age-related cognitive decline: Secondary | ICD-10-CM | POA: Diagnosis not present

## 2015-03-22 DIAGNOSIS — D649 Anemia, unspecified: Secondary | ICD-10-CM | POA: Diagnosis present

## 2015-03-22 DIAGNOSIS — Z888 Allergy status to other drugs, medicaments and biological substances status: Secondary | ICD-10-CM

## 2015-03-22 DIAGNOSIS — M25551 Pain in right hip: Secondary | ICD-10-CM

## 2015-03-22 DIAGNOSIS — N184 Chronic kidney disease, stage 4 (severe): Secondary | ICD-10-CM | POA: Diagnosis present

## 2015-03-22 DIAGNOSIS — R52 Pain, unspecified: Secondary | ICD-10-CM

## 2015-03-22 DIAGNOSIS — Y92002 Bathroom of unspecified non-institutional (private) residence single-family (private) house as the place of occurrence of the external cause: Secondary | ICD-10-CM | POA: Diagnosis not present

## 2015-03-22 DIAGNOSIS — H5442 Blindness, left eye, normal vision right eye: Secondary | ICD-10-CM | POA: Diagnosis present

## 2015-03-22 DIAGNOSIS — I4891 Unspecified atrial fibrillation: Secondary | ICD-10-CM | POA: Diagnosis present

## 2015-03-22 DIAGNOSIS — R51 Headache: Secondary | ICD-10-CM | POA: Diagnosis not present

## 2015-03-22 DIAGNOSIS — D631 Anemia in chronic kidney disease: Secondary | ICD-10-CM | POA: Diagnosis present

## 2015-03-22 DIAGNOSIS — N183 Chronic kidney disease, stage 3 (moderate): Secondary | ICD-10-CM | POA: Diagnosis not present

## 2015-03-22 DIAGNOSIS — Z6841 Body Mass Index (BMI) 40.0 and over, adult: Secondary | ICD-10-CM | POA: Diagnosis not present

## 2015-03-22 DIAGNOSIS — W19XXXA Unspecified fall, initial encounter: Secondary | ICD-10-CM

## 2015-03-22 DIAGNOSIS — H5440 Blindness, one eye, unspecified eye: Secondary | ICD-10-CM | POA: Diagnosis present

## 2015-03-22 DIAGNOSIS — D509 Iron deficiency anemia, unspecified: Secondary | ICD-10-CM | POA: Diagnosis present

## 2015-03-22 DIAGNOSIS — Z882 Allergy status to sulfonamides status: Secondary | ICD-10-CM

## 2015-03-22 DIAGNOSIS — E119 Type 2 diabetes mellitus without complications: Secondary | ICD-10-CM | POA: Diagnosis not present

## 2015-03-22 DIAGNOSIS — Z794 Long term (current) use of insulin: Secondary | ICD-10-CM

## 2015-03-22 DIAGNOSIS — N179 Acute kidney failure, unspecified: Principal | ICD-10-CM | POA: Diagnosis present

## 2015-03-22 DIAGNOSIS — N189 Chronic kidney disease, unspecified: Secondary | ICD-10-CM | POA: Diagnosis not present

## 2015-03-22 DIAGNOSIS — I120 Hypertensive chronic kidney disease with stage 5 chronic kidney disease or end stage renal disease: Secondary | ICD-10-CM | POA: Diagnosis not present

## 2015-03-22 DIAGNOSIS — S8001XA Contusion of right knee, initial encounter: Secondary | ICD-10-CM | POA: Diagnosis present

## 2015-03-22 DIAGNOSIS — M542 Cervicalgia: Secondary | ICD-10-CM

## 2015-03-22 DIAGNOSIS — S0990XA Unspecified injury of head, initial encounter: Secondary | ICD-10-CM | POA: Diagnosis not present

## 2015-03-22 DIAGNOSIS — N289 Disorder of kidney and ureter, unspecified: Secondary | ICD-10-CM | POA: Diagnosis not present

## 2015-03-22 DIAGNOSIS — W010XXA Fall on same level from slipping, tripping and stumbling without subsequent striking against object, initial encounter: Secondary | ICD-10-CM | POA: Diagnosis present

## 2015-03-22 DIAGNOSIS — I471 Supraventricular tachycardia: Secondary | ICD-10-CM | POA: Diagnosis present

## 2015-03-22 DIAGNOSIS — M793 Panniculitis, unspecified: Secondary | ICD-10-CM

## 2015-03-22 DIAGNOSIS — S199XXA Unspecified injury of neck, initial encounter: Secondary | ICD-10-CM | POA: Diagnosis not present

## 2015-03-22 DIAGNOSIS — G4739 Other sleep apnea: Secondary | ICD-10-CM | POA: Diagnosis not present

## 2015-03-22 DIAGNOSIS — S79911A Unspecified injury of right hip, initial encounter: Secondary | ICD-10-CM | POA: Diagnosis not present

## 2015-03-22 DIAGNOSIS — N2581 Secondary hyperparathyroidism of renal origin: Secondary | ICD-10-CM | POA: Diagnosis not present

## 2015-03-22 DIAGNOSIS — N390 Urinary tract infection, site not specified: Secondary | ICD-10-CM | POA: Diagnosis not present

## 2015-03-22 DIAGNOSIS — M50322 Other cervical disc degeneration at C5-C6 level: Secondary | ICD-10-CM | POA: Diagnosis not present

## 2015-03-22 DIAGNOSIS — M7989 Other specified soft tissue disorders: Secondary | ICD-10-CM | POA: Diagnosis not present

## 2015-03-22 DIAGNOSIS — R8299 Other abnormal findings in urine: Secondary | ICD-10-CM | POA: Diagnosis not present

## 2015-03-22 NOTE — ED Notes (Signed)
Bruising to L middle finger, secondary to EMS fall.

## 2015-03-22 NOTE — ED Notes (Signed)
Pt c/o head and neck pain from the fall on the EMS stretcher.
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
## 2015-03-23 ENCOUNTER — Inpatient Hospital Stay (HOSPITAL_COMMUNITY): Payer: Medicare Other

## 2015-03-23 ENCOUNTER — Ambulatory Visit (HOSPITAL_COMMUNITY): Admission: RE | Admit: 2015-03-23 | Payer: Medicare Other | Source: Ambulatory Visit

## 2015-03-23 ENCOUNTER — Encounter: Payer: Self-pay | Admitting: *Deleted

## 2015-03-23 ENCOUNTER — Encounter (HOSPITAL_COMMUNITY): Payer: Self-pay | Admitting: Internal Medicine

## 2015-03-23 ENCOUNTER — Ambulatory Visit (HOSPITAL_COMMUNITY): Payer: Medicare Other

## 2015-03-23 ENCOUNTER — Encounter (HOSPITAL_COMMUNITY): Payer: Medicare Other

## 2015-03-23 DIAGNOSIS — E785 Hyperlipidemia, unspecified: Secondary | ICD-10-CM | POA: Diagnosis present

## 2015-03-23 DIAGNOSIS — D649 Anemia, unspecified: Secondary | ICD-10-CM | POA: Diagnosis present

## 2015-03-23 DIAGNOSIS — I5032 Chronic diastolic (congestive) heart failure: Secondary | ICD-10-CM | POA: Diagnosis not present

## 2015-03-23 DIAGNOSIS — G4733 Obstructive sleep apnea (adult) (pediatric): Secondary | ICD-10-CM | POA: Diagnosis present

## 2015-03-23 DIAGNOSIS — N189 Chronic kidney disease, unspecified: Secondary | ICD-10-CM | POA: Diagnosis present

## 2015-03-23 DIAGNOSIS — N390 Urinary tract infection, site not specified: Secondary | ICD-10-CM | POA: Diagnosis not present

## 2015-03-23 DIAGNOSIS — M542 Cervicalgia: Secondary | ICD-10-CM | POA: Diagnosis present

## 2015-03-23 DIAGNOSIS — E1122 Type 2 diabetes mellitus with diabetic chronic kidney disease: Secondary | ICD-10-CM | POA: Diagnosis not present

## 2015-03-23 DIAGNOSIS — Z6841 Body Mass Index (BMI) 40.0 and over, adult: Secondary | ICD-10-CM | POA: Diagnosis not present

## 2015-03-23 DIAGNOSIS — R4181 Age-related cognitive decline: Secondary | ICD-10-CM | POA: Diagnosis not present

## 2015-03-23 DIAGNOSIS — N2581 Secondary hyperparathyroidism of renal origin: Secondary | ICD-10-CM | POA: Diagnosis not present

## 2015-03-23 DIAGNOSIS — N289 Disorder of kidney and ureter, unspecified: Secondary | ICD-10-CM | POA: Diagnosis not present

## 2015-03-23 DIAGNOSIS — Z794 Long term (current) use of insulin: Secondary | ICD-10-CM | POA: Diagnosis not present

## 2015-03-23 DIAGNOSIS — I471 Supraventricular tachycardia: Secondary | ICD-10-CM | POA: Diagnosis present

## 2015-03-23 DIAGNOSIS — G4739 Other sleep apnea: Secondary | ICD-10-CM | POA: Diagnosis not present

## 2015-03-23 DIAGNOSIS — I2510 Atherosclerotic heart disease of native coronary artery without angina pectoris: Secondary | ICD-10-CM | POA: Diagnosis present

## 2015-03-23 DIAGNOSIS — D696 Thrombocytopenia, unspecified: Secondary | ICD-10-CM | POA: Diagnosis present

## 2015-03-23 DIAGNOSIS — R8299 Other abnormal findings in urine: Secondary | ICD-10-CM | POA: Diagnosis not present

## 2015-03-23 DIAGNOSIS — I872 Venous insufficiency (chronic) (peripheral): Secondary | ICD-10-CM | POA: Diagnosis present

## 2015-03-23 DIAGNOSIS — H5442 Blindness, left eye, normal vision right eye: Secondary | ICD-10-CM | POA: Diagnosis present

## 2015-03-23 DIAGNOSIS — D509 Iron deficiency anemia, unspecified: Secondary | ICD-10-CM | POA: Diagnosis present

## 2015-03-23 DIAGNOSIS — M50322 Other cervical disc degeneration at C5-C6 level: Secondary | ICD-10-CM | POA: Diagnosis not present

## 2015-03-23 DIAGNOSIS — R195 Other fecal abnormalities: Secondary | ICD-10-CM

## 2015-03-23 DIAGNOSIS — I130 Hypertensive heart and chronic kidney disease with heart failure and stage 1 through stage 4 chronic kidney disease, or unspecified chronic kidney disease: Secondary | ICD-10-CM | POA: Diagnosis present

## 2015-03-23 DIAGNOSIS — Z7982 Long term (current) use of aspirin: Secondary | ICD-10-CM | POA: Diagnosis not present

## 2015-03-23 DIAGNOSIS — S8001XA Contusion of right knee, initial encounter: Secondary | ICD-10-CM | POA: Diagnosis present

## 2015-03-23 DIAGNOSIS — N179 Acute kidney failure, unspecified: Secondary | ICD-10-CM | POA: Diagnosis not present

## 2015-03-23 DIAGNOSIS — E119 Type 2 diabetes mellitus without complications: Secondary | ICD-10-CM | POA: Diagnosis not present

## 2015-03-23 DIAGNOSIS — M25551 Pain in right hip: Secondary | ICD-10-CM | POA: Diagnosis not present

## 2015-03-23 DIAGNOSIS — I120 Hypertensive chronic kidney disease with stage 5 chronic kidney disease or end stage renal disease: Secondary | ICD-10-CM | POA: Diagnosis not present

## 2015-03-23 DIAGNOSIS — Z880 Allergy status to penicillin: Secondary | ICD-10-CM | POA: Diagnosis not present

## 2015-03-23 DIAGNOSIS — D631 Anemia in chronic kidney disease: Secondary | ICD-10-CM | POA: Diagnosis not present

## 2015-03-23 DIAGNOSIS — W010XXA Fall on same level from slipping, tripping and stumbling without subsequent striking against object, initial encounter: Secondary | ICD-10-CM | POA: Diagnosis present

## 2015-03-23 DIAGNOSIS — R0602 Shortness of breath: Secondary | ICD-10-CM | POA: Diagnosis not present

## 2015-03-23 DIAGNOSIS — I4891 Unspecified atrial fibrillation: Secondary | ICD-10-CM | POA: Diagnosis present

## 2015-03-23 DIAGNOSIS — Y92002 Bathroom of unspecified non-institutional (private) residence single-family (private) house as the place of occurrence of the external cause: Secondary | ICD-10-CM | POA: Diagnosis not present

## 2015-03-23 DIAGNOSIS — I5042 Chronic combined systolic (congestive) and diastolic (congestive) heart failure: Secondary | ICD-10-CM | POA: Diagnosis present

## 2015-03-23 DIAGNOSIS — I10 Essential (primary) hypertension: Secondary | ICD-10-CM | POA: Diagnosis not present

## 2015-03-23 DIAGNOSIS — N183 Chronic kidney disease, stage 3 (moderate): Secondary | ICD-10-CM | POA: Diagnosis not present

## 2015-03-23 DIAGNOSIS — Z888 Allergy status to other drugs, medicaments and biological substances status: Secondary | ICD-10-CM | POA: Diagnosis not present

## 2015-03-23 DIAGNOSIS — Z882 Allergy status to sulfonamides status: Secondary | ICD-10-CM | POA: Diagnosis not present

## 2015-03-23 DIAGNOSIS — N184 Chronic kidney disease, stage 4 (severe): Secondary | ICD-10-CM | POA: Diagnosis not present

## 2015-03-23 DIAGNOSIS — S79911A Unspecified injury of right hip, initial encounter: Secondary | ICD-10-CM | POA: Diagnosis not present

## 2015-03-23 LAB — COMPREHENSIVE METABOLIC PANEL
ALBUMIN: 3.2 g/dL — AB (ref 3.5–5.0)
ALK PHOS: 58 U/L (ref 38–126)
ALT: 23 U/L (ref 17–63)
ALT: 23 U/L (ref 17–63)
ANION GAP: 11 (ref 5–15)
ANION GAP: 10 (ref 5–15)
AST: 36 U/L (ref 15–41)
AST: 35 U/L (ref 15–41)
Albumin: 3.3 g/dL — ABNORMAL LOW (ref 3.5–5.0)
Alkaline Phosphatase: 59 U/L (ref 38–126)
BILIRUBIN TOTAL: 1.4 mg/dL — AB (ref 0.3–1.2)
BILIRUBIN TOTAL: 1.1 mg/dL (ref 0.3–1.2)
BUN: 72 mg/dL — ABNORMAL HIGH (ref 6–20)
BUN: 73 mg/dL — ABNORMAL HIGH (ref 6–20)
CALCIUM: 8.8 mg/dL — AB (ref 8.9–10.3)
CHLORIDE: 106 mmol/L (ref 101–111)
CO2: 22 mmol/L (ref 22–32)
CO2: 24 mmol/L (ref 22–32)
Calcium: 8.7 mg/dL — ABNORMAL LOW (ref 8.9–10.3)
Chloride: 107 mmol/L (ref 101–111)
Creatinine, Ser: 3.22 mg/dL — ABNORMAL HIGH (ref 0.61–1.24)
Creatinine, Ser: 3.27 mg/dL — ABNORMAL HIGH (ref 0.61–1.24)
GFR calc Af Amer: 19 mL/min — ABNORMAL LOW (ref >60)
GFR calc non Af Amer: 16 mL/min — ABNORMAL LOW (ref >60)
GFR, EST AFRICAN AMERICAN: 19 mL/min — AB (ref >60)
GFR, EST NON AFRICAN AMERICAN: 17 mL/min — AB (ref >60)
GLUCOSE: 95 mg/dL (ref 65–99)
Glucose, Bld: 107 mg/dL — ABNORMAL HIGH (ref 65–99)
POTASSIUM: 4.4 mmol/L (ref 3.5–5.1)
POTASSIUM: 4.3 mmol/L (ref 3.5–5.1)
SODIUM: 140 mmol/L (ref 135–145)
Sodium: 140 mmol/L (ref 135–145)
TOTAL PROTEIN: 5.7 g/dL — AB (ref 6.5–8.1)
TOTAL PROTEIN: 5.5 g/dL — AB (ref 6.5–8.1)

## 2015-03-23 LAB — CBC WITH DIFFERENTIAL/PLATELET
BASOS ABS: 0 10*3/uL (ref 0.0–0.1)
BASOS PCT: 0 %
Basophils Absolute: 0 10*3/uL (ref 0.0–0.1)
Basophils Relative: 1 %
Eosinophils Absolute: 0.1 10*3/uL (ref 0.0–0.7)
Eosinophils Absolute: 0.2 10*3/uL (ref 0.0–0.7)
Eosinophils Relative: 3 %
Eosinophils Relative: 4 %
HEMATOCRIT: 23.9 % — AB (ref 39.0–52.0)
HEMATOCRIT: 22.8 % — AB (ref 39.0–52.0)
Hemoglobin: 7 g/dL — ABNORMAL LOW (ref 13.0–17.0)
Hemoglobin: 6.9 g/dL — CL (ref 13.0–17.0)
LYMPHS ABS: 1.2 10*3/uL (ref 0.7–4.0)
LYMPHS ABS: 1 10*3/uL (ref 0.7–4.0)
LYMPHS PCT: 21 %
LYMPHS PCT: 22 %
MCH: 25.7 pg — AB (ref 26.0–34.0)
MCH: 26.5 pg (ref 26.0–34.0)
MCHC: 29.3 g/dL — AB (ref 30.0–36.0)
MCHC: 30.3 g/dL (ref 30.0–36.0)
MCV: 87.9 fL (ref 78.0–100.0)
MCV: 87.7 fL (ref 78.0–100.0)
MONO ABS: 0.5 10*3/uL (ref 0.1–1.0)
MONO ABS: 0.6 10*3/uL (ref 0.1–1.0)
MONOS PCT: 9 %
Monocytes Relative: 13 %
NEUTROS ABS: 3.8 10*3/uL (ref 1.7–7.7)
NEUTROS ABS: 2.9 10*3/uL (ref 1.7–7.7)
Neutrophils Relative %: 68 %
Neutrophils Relative %: 61 %
Platelets: 99 10*3/uL — ABNORMAL LOW (ref 150–400)
Platelets: 95 10*3/uL — ABNORMAL LOW (ref 150–400)
RBC: 2.72 MIL/uL — ABNORMAL LOW (ref 4.22–5.81)
RBC: 2.6 MIL/uL — ABNORMAL LOW (ref 4.22–5.81)
RDW: 18.4 % — AB (ref 11.5–15.5)
RDW: 18.5 % — AB (ref 11.5–15.5)
WBC: 5.7 10*3/uL (ref 4.0–10.5)
WBC: 4.7 10*3/uL (ref 4.0–10.5)

## 2015-03-23 LAB — ABO/RH: ABO/RH(D): O POS

## 2015-03-23 LAB — GLUCOSE, CAPILLARY
GLUCOSE-CAPILLARY: 138 mg/dL — AB (ref 65–99)
Glucose-Capillary: 104 mg/dL — ABNORMAL HIGH (ref 65–99)
Glucose-Capillary: 162 mg/dL — ABNORMAL HIGH (ref 65–99)

## 2015-03-23 LAB — PREPARE RBC (CROSSMATCH)

## 2015-03-23 LAB — HEMOGLOBIN AND HEMATOCRIT, BLOOD
HEMATOCRIT: 25.7 % — AB (ref 39.0–52.0)
Hemoglobin: 7.9 g/dL — ABNORMAL LOW (ref 13.0–17.0)

## 2015-03-23 LAB — POC OCCULT BLOOD, ED: FECAL OCCULT BLD: POSITIVE — AB

## 2015-03-23 MED ORDER — SERTRALINE HCL 50 MG PO TABS
25.0000 mg | ORAL_TABLET | Freq: Every day | ORAL | Status: DC
  Administered 2015-03-23 – 2015-03-24 (×2): 25 mg via ORAL
  Filled 2015-03-23 (×3): qty 1

## 2015-03-23 MED ORDER — TAMSULOSIN HCL 0.4 MG PO CAPS
0.4000 mg | ORAL_CAPSULE | Freq: Every day | ORAL | Status: DC
  Administered 2015-03-23 – 2015-03-24 (×2): 0.4 mg via ORAL
  Filled 2015-03-23 (×3): qty 1

## 2015-03-23 MED ORDER — SODIUM CHLORIDE 0.9 % IJ SOLN
3.0000 mL | Freq: Two times a day (BID) | INTRAMUSCULAR | Status: DC
  Administered 2015-03-23 – 2015-03-26 (×7): 3 mL via INTRAVENOUS

## 2015-03-23 MED ORDER — SODIUM CHLORIDE 0.9 % IV SOLN: Freq: Once | INTRAVENOUS | Status: DC

## 2015-03-23 MED ORDER — VITAMIN D 1000 UNITS PO TABS
5000.0000 [IU] | ORAL_TABLET | Freq: Every day | ORAL | Status: DC
  Administered 2015-03-23 – 2015-03-26 (×4): 5000 [IU] via ORAL
  Filled 2015-03-23 (×3): qty 5

## 2015-03-23 MED ORDER — FLUTICASONE PROPIONATE 50 MCG/ACT NA SUSP
2.0000 | Freq: Every day | NASAL | Status: DC
  Administered 2015-03-23 – 2015-03-26 (×3): 2 via NASAL
  Filled 2015-03-23: qty 16

## 2015-03-23 MED ORDER — ONDANSETRON HCL 4 MG/2ML IJ SOLN: 4.0000 mg | Freq: Four times a day (QID) | INTRAMUSCULAR | Status: DC | PRN

## 2015-03-23 MED ORDER — TORSEMIDE 20 MG PO TABS
60.0000 mg | ORAL_TABLET | Freq: Every day | ORAL | Status: DC
  Administered 2015-03-23 – 2015-03-25 (×3): 60 mg via ORAL
  Filled 2015-03-23 (×3): qty 3

## 2015-03-23 MED ORDER — TORSEMIDE 20 MG PO TABS
40.0000 mg | ORAL_TABLET | Freq: Every day | ORAL | Status: DC
  Administered 2015-03-23 – 2015-03-24 (×2): 40 mg via ORAL
  Filled 2015-03-23 (×2): qty 2

## 2015-03-23 MED ORDER — ACETAMINOPHEN 650 MG RE SUPP: 650.0000 mg | Freq: Four times a day (QID) | RECTAL | Status: DC | PRN

## 2015-03-23 MED ORDER — ONDANSETRON HCL 4 MG PO TABS: 4.0000 mg | ORAL_TABLET | Freq: Four times a day (QID) | ORAL | Status: DC | PRN

## 2015-03-23 MED ORDER — CALCIUM CARBONATE-VITAMIN D 500-200 MG-UNIT PO TABS
1.0000 | ORAL_TABLET | Freq: Two times a day (BID) | ORAL | Status: DC
  Administered 2015-03-23 – 2015-03-25 (×5): 1 via ORAL
  Filled 2015-03-23 (×5): qty 1

## 2015-03-23 MED ORDER — LABETALOL HCL 200 MG PO TABS
200.0000 mg | ORAL_TABLET | Freq: Two times a day (BID) | ORAL | Status: DC
  Administered 2015-03-23: 200 mg via ORAL
  Filled 2015-03-23: qty 1

## 2015-03-23 MED ORDER — SPIRONOLACTONE 25 MG PO TABS
25.0000 mg | ORAL_TABLET | Freq: Every day | ORAL | Status: DC
  Administered 2015-03-23 – 2015-03-26 (×4): 25 mg via ORAL
  Filled 2015-03-23 (×4): qty 1

## 2015-03-23 MED ORDER — LORATADINE 10 MG PO TABS
10.0000 mg | ORAL_TABLET | Freq: Every day | ORAL | Status: DC
  Administered 2015-03-23 – 2015-03-26 (×4): 10 mg via ORAL
  Filled 2015-03-23 (×4): qty 1

## 2015-03-23 MED ORDER — LATANOPROST 0.005 % OP SOLN
1.0000 [drp] | Freq: Every day | OPHTHALMIC | Status: DC
  Administered 2015-03-23 – 2015-03-24 (×2): 1 [drp] via OPHTHALMIC
  Filled 2015-03-23: qty 2.5

## 2015-03-23 MED ORDER — VITAMIN B-12 1000 MCG PO TABS
1000.0000 ug | ORAL_TABLET | Freq: Every day | ORAL | Status: DC
  Administered 2015-03-23 – 2015-03-26 (×4): 1000 ug via ORAL
  Filled 2015-03-23 (×4): qty 1

## 2015-03-23 MED ORDER — METOLAZONE 2.5 MG PO TABS: 2.5000 mg | ORAL_TABLET | ORAL | Status: DC

## 2015-03-23 MED ORDER — HYDROCODONE-ACETAMINOPHEN 5-325 MG PO TABS: 1.0000 | ORAL_TABLET | Freq: Four times a day (QID) | ORAL | Status: DC | PRN

## 2015-03-23 MED ORDER — AMLODIPINE BESYLATE 10 MG PO TABS
10.0000 mg | ORAL_TABLET | Freq: Every day | ORAL | Status: DC
  Administered 2015-03-23: 10 mg via ORAL
  Filled 2015-03-23: qty 1

## 2015-03-23 MED ORDER — LABETALOL HCL 200 MG PO TABS
200.0000 mg | ORAL_TABLET | Freq: Two times a day (BID) | ORAL | Status: DC
  Administered 2015-03-23 – 2015-03-24 (×3): 200 mg via ORAL
  Filled 2015-03-23 (×3): qty 1

## 2015-03-23 MED ORDER — LIDOCAINE 5 % EX PTCH: 1.0000 | MEDICATED_PATCH | Freq: Every day | CUTANEOUS | Status: DC | PRN

## 2015-03-23 MED ORDER — HYDROCODONE-ACETAMINOPHEN 5-325 MG PO TABS
2.0000 | ORAL_TABLET | Freq: Once | ORAL | Status: AC
  Administered 2015-03-23: 2 via ORAL
  Filled 2015-03-23: qty 2

## 2015-03-23 MED ORDER — POTASSIUM CHLORIDE CRYS ER 20 MEQ PO TBCR
40.0000 meq | EXTENDED_RELEASE_TABLET | Freq: Every day | ORAL | Status: DC
  Administered 2015-03-23 – 2015-03-26 (×4): 40 meq via ORAL
  Filled 2015-03-23 (×5): qty 2

## 2015-03-23 MED ORDER — ADULT MULTIVITAMIN W/MINERALS CH
1.0000 | ORAL_TABLET | Freq: Every day | ORAL | Status: DC
  Administered 2015-03-23 – 2015-03-26 (×4): 1 via ORAL
  Filled 2015-03-23 (×5): qty 1

## 2015-03-23 MED ORDER — INSULIN ASPART 100 UNIT/ML ~~LOC~~ SOLN
0.0000 [IU] | Freq: Three times a day (TID) | SUBCUTANEOUS | Status: DC
Start: 2015-03-23 — End: 2015-03-26
  Administered 2015-03-23: 1 [IU] via SUBCUTANEOUS
  Administered 2015-03-24: 2 [IU] via SUBCUTANEOUS
  Administered 2015-03-24: 1 [IU] via SUBCUTANEOUS
  Administered 2015-03-25: 2 [IU] via SUBCUTANEOUS
  Administered 2015-03-25: 1 [IU] via SUBCUTANEOUS
  Administered 2015-03-25: 2 [IU] via SUBCUTANEOUS
  Administered 2015-03-26 (×2): 1 [IU] via SUBCUTANEOUS

## 2015-03-23 MED ORDER — ACETAMINOPHEN 325 MG PO TABS
650.0000 mg | ORAL_TABLET | Freq: Four times a day (QID) | ORAL | Status: DC | PRN
  Administered 2015-03-24 – 2015-03-26 (×4): 650 mg via ORAL
  Filled 2015-03-23 (×4): qty 2

## 2015-03-23 MED ORDER — INSULIN DETEMIR 100 UNIT/ML ~~LOC~~ SOLN
15.0000 [IU] | Freq: Every day | SUBCUTANEOUS | Status: DC
  Administered 2015-03-23 – 2015-03-25 (×3): 15 [IU] via SUBCUTANEOUS
  Filled 2015-03-23 (×4): qty 0.15

## 2015-03-23 NOTE — Progress Notes (Signed)
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 Admitted with symptomatic anemia  Clinical Impression  Pt admitted with above diagnosis. Pt currently with functional limitations due to the deficits listed below (see PT Problem List). Required +2 assist for bed mobility and transfer with sit<>stand from bed. Very fatigued upon standing and only tolerated x1 minute but, does demonstrate ability to bear weight well through BIL LEs. Reviewed exercises. Limited assistance available at home as wife does not appear to be physically capable of caring for patient in current condition. Would favor ST-SNF to improve patients ability to safely ambulate and transfer prior to returning home. Pt will benefit from skilled PT to increase their independence and safety with mobility to allow discharge to the venue listed below.        Follow Up Recommendations SNF - Pt adamant about returning home. Very limited assistance. Would recommend HHPT if he refuses SNF.    Equipment Recommendations  None recommended by PT    Recommendations for Other Services OT consult     Precautions / Restrictions Precautions Precautions: Fall Restrictions Weight Bearing Restrictions: No      Mobility  Bed Mobility Overal bed mobility: Needs Assistance Bed Mobility: Supine to Sit;Sit to Supine     Supine to sit: Min assist;+2 for physical assistance;HOB elevated Sit to supine: Min assist;HOB elevated   General bed mobility comments: HOB elevated, VC for technique. Heavy use of rail. Able to slowly move LEs off bed, but required min  assist for UE support to pull self to seated position. Min assist for LE support back into bed. Attempted to teach log roll technique however pt rushes and does not perform as explained.  Transfers Overall transfer level: Needs assistance Equipment used:  (back of chair) Transfers: Sit to/from Stand Sit to Stand: +2 physical assistance;Mod assist         General transfer comment: Mod assist +2 for boost using bed pad under buttocks for support and pts hands on chair in front.  VC to shift weight anteriorly. Slow to rise. Only tolerated standing x1 minute before completely fatigued.  Ambulation/Gait                Stairs            Wheelchair Mobility    Modified Rankin (Stroke Patients Only)       Balance Overall balance assessment: Needs assistance;History of Falls Sitting-balance support: Feet supported;No upper extremity supported Sitting balance-Leahy Scale: Good     Standing balance support: No upper extremity supported;Bilateral upper extremity supported Standing balance-Leahy Scale: Poor Standing balance comment: Stood less than 1 minute but able to shift weight several times to bring LEs closer together. States he cannot ambulate                             Pertinent Vitals/Pain Pain Assessment: 0-10 Pain Score:  ("all over" no value given) Pain Location: "all over" Pain Descriptors / Indicators:  ("hurting") Pain Intervention(s): Monitored during session;Repositioned    Home Living Family/patient expects to be discharged to:: Private residence Living Arrangements: Spouse/significant other Available Help at Discharge: Family  Type of Home: House Home Access: Ramped entrance     Home Layout: One level Home Equipment: [place] - 2 wheels;Wheelchair - [employer];Tub bench;Wheelchair - power      Prior Function Level of Independence: Independent with assistive device(s)   Gait / Transfers Assistance Needed: uses walker in  house           Hand Dominance        Extremity/Trunk Assessment   Upper Extremity Assessment: Defer to OT evaluation           Lower Extremity Assessment: Generalized weakness (LLE grossly edematous)         Communication   Communication: HOH  Cognition Arousal/Alertness: Awake/alert Behavior During Therapy: WFL for tasks assessed/performed Overall Cognitive Status: Impaired/Different from baseline Area of Impairment: Safety/judgement         Safety/Judgement: Decreased awareness of deficits     General Comments: Thinks he can go home despite inability to safely mobilize.    General Comments General comments (skin integrity, edema, etc.): SpO2 97% on room air during therapy. 4/4 dyspnea. Cues for pursed lip breathing.    Exercises General Exercises - Lower Extremity Ankle Circles/Pumps: AROM;Both;Supine;15 reps Quad Sets: Strengthening;Both;10 reps;Supine Heel Slides: Strengthening;Both;10 reps;Supine      Assessment/Plan    PT Assessment Patient needs continued PT services  PT Diagnosis Difficulty walking;Generalized weakness;Acute pain   PT Problem List Decreased strength;Decreased range of motion;Decreased activity tolerance;Decreased balance;Decreased mobility;Decreased cognition;Decreased knowledge of use of DME;Cardiopulmonary status limiting activity;Obesity;Pain  PT Treatment Interventions DME instruction;Gait training;Functional mobility training;Therapeutic activities;Therapeutic exercise;Balance training;Neuromuscular re-education;Patient/family education;Modalities   PT Goals (Current goals can be found in the Care Plan section) Acute Rehab PT Goals Patient Stated Goal: Go home PT Goal Formulation: With patient Time For Goal Achievement: 04/06/15 Potential to Achieve Goals: Good    Frequency Min 3X/week   Barriers to discharge Decreased caregiver support lives with wife who cannot physically assist pt.    Co-evaluation                End of Session Equipment Utilized During Treatment: Gait belt Activity Tolerance: Patient limited by fatigue Patient left: in bed;with call bell/phone within reach;with family/visitor present Nurse Communication: Mobility status         Time: [id number] PT Time Calculation (min) (ACUTE ONLY): 29 min   Charges:   PT Evaluation $Initial PT Evaluation Tier I: 1 Procedure PT Treatments $Therapeutic Activity: 8-22 mins   PT G CodesEllouise Newer 03/23/2015, 4:33 PM  Camille Bal [place], [place]

## 2015-03-23 NOTE — Progress Notes (Signed)
CRITICAL VALUE ALERT  Critical value received:  Hgb = 6.9  Date of notification:  03-23-2015  Time of notification:  07:19  Critical value read back:Yes.    Nurse who received alert:  Genelle Bal, RN  MD notified (1st page):  Coralyn Pear  Time of first page:  07:24  MD notified (2nd page):  Time of second page:  Responding MD:  Coralyn Pear  Time MD responded:  07:24

## 2015-03-23 NOTE — Progress Notes (Signed)
New Admission Note:   Arrival: From ED on bed with RN Mental Orientation: A&Ox4 Telemetry: Placed on box 6e16 Assessment:  See doc flowsheet Skin: Multiple bruising on left forearm, left hand, right arm, and right knee.  MASD and yeast visualized under left abdominal folds.  Per ED RN, barrier cream had been applied.  Darkened skin on left and right lower legs, from past cellulitis per patient.  Scrotum red and swollen.  Skin is dry and scaly with numerous skin tags. IV: Right hand IV Pain: None Safety Measures:  Call bell placed within reach; patient instructed on use of call bell and verbalized understanding. Bed in lowest position.  Bed alarm on. [address] Orientation: Patient oriented to staff, room, and unit. Family: Son at bedside  Awaiting orders currently.  Son brought in patient's home CPAP machine to use while in hospital.  Will continue to monitor.  Arlyss Queen, RN, BSN
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
## 2015-03-23 NOTE — Progress Notes (Signed)
[facility] PROGRESS NOTE  [name] WNU:[id number] DOB: 1931/08/31 DOA: 03/22/2015 PCP: Wende Neighbors, MD  Assessment/Plan: 1. Symptomatic anemia -[name] having a history of normocytic anemia thought to be secondary to chronic kidney disease, and had been undergoing infusion with IV iron in the outpatient setting. -He reports worsening generalized weakness associate with shortness of breath having a fall yesterday at home. -Labs showing hemoglobin of 6.9. Prior to this labs indicate that he had a hemoglobin of 8.3 on 02/26/2015. -He denies bright red blood per rectum or hematemesis. -Previous workup included electrophoresis which did not reveal multiple myeloma. -He was typed and cross today and will be transfused with 2 units of packed red blood cells -Repeat labs in a.m. Reassess.  2.  Acute on chronic renal failure -Lab work showing creatinine of 3.22 with BUN is 72. Lab work from 02/26/2015 revealed a creatinine of 2.3 with BUN of 48.4. He is being administered volume through transfusion of packed red blood cells -Repeat lab work in a.m. after transfusion with 2 units of packed red blood cells.  3.  Status post fall -Patient reporting a fall at home which attributes to generalized weakness. -Will reassess after transfusion with 2 units packed red blood cells -Physical therapy consultation placed  4.  Chronic combined systolic and diastolic congestive heart failure -Last transthoracic echocardiogram was performed on 08/18/2014 which showed ejection fraction 45-50% with grade 2 diastolic dysfunction -Will monitor closely as he has been transfused with 2 units packed red blood cells today, currently does not have clinical evidence of acute decompensated congestive heart failure -Continue torsemide 40 mg by mouth in evening, 60 mg by mouth in a.m. as well as spironolactone 25 mg by mouth daily.  5.  Hypertension -His blood pressures on the low normal side this afternoon,  having systolic blood pressures in the low 100s. -Will discontinue Norvasc and place holding parameters on his labetalol.  Code Status: Full code Family Communication: I spoke with family members were present at bedside Disposition Plan: Patient receiving blood transfusion today anticipate discharge home in medically stable. Pending physical therapy consultation   Procedures:  Transfusion with 2 units of packed red blood cells on 03/23/2015  HPI/Subjective: [name] is a pleasant 81 year old gentleman with multiple comorbidities including stage 3/4 chronic kidney disease, anemia of chronic kidney disease, congestive heart failure, obstructive sleep apnea, morbid obesity, admitted to medicine service on 03/23/2015 after having a fall at home. He reports having increasing generalized weakness associate with shortness of breath. He reported Halina fall at home while going to the bathroom stating his legs gave out. Denied loss of consciousness. Workup in the emergency department which included a CT scan of brain, MRI of C-spine, x-ray of hip were unremarkable. No evidence of fracture. Labs did reveal hemoglobin of 6.9, down from 8.3 on 02/26/2015. Labs also revealed the development of acute kidney injury having creatinine of 3.22, increased from 2.3 on 02/26/2015. Symptoms felt to be secondary to anemia as he was typed and crossed and transfused with 2 units of packed red blood cells.  Objective: Filed Vitals:   03/23/15 1330 03/23/15 1350  BP: 105/42 99/37  Pulse: 57 60  Temp: 98.9 F (37.2 C) 98.1 F (36.7 C)  Resp: 20 20    Intake/Output Summary (Last 24 hours) at 03/23/15 1551 Last data filed at 03/23/15 1335  Gross per 24 hour  Intake     15 ml  Output      0 ml  Net  15 ml   Filed Weights   03/23/15 0512  Weight: 177.7 kg (391 lb 12.1 oz)    Exam:   General:  Patient is awake and alert, no acute distress currently receiving blood transfusion  Cardiovascular: 2/6  systolic ejection murmur, normal S1-S2  Respiratory: Diminished breath sounds bilaterally, suspect related to body habitus, I did not auscultate crackles, rales, rhonchi  Abdomen: Obese, soft nontender nondistended  Musculoskeletal: Venous stasis changes involving bilateral extremities  Data Reviewed: Basic Metabolic Panel:  Recent Labs Lab 03/23/15 0118 03/23/15 0644  NA 140 140  K 4.4 4.3  CL 107 106  CO2 22 24  GLUCOSE 107* 95  BUN 72* 73*  CREATININE 3.22* 3.27*  CALCIUM 8.8* 8.7*   Liver Function Tests:  Recent Labs Lab 03/23/15 0118 03/23/15 0644  AST 36 35  ALT 23 23  ALKPHOS 58 59  BILITOT 1.4* 1.1  PROT 5.7* 5.5*  ALBUMIN 3.3* 3.2*   No results for input(s): LIPASE, AMYLASE in the last 168 hours. No results for input(s): AMMONIA in the last 168 hours. CBC:  Recent Labs Lab 03/23/15 0118 03/23/15 0644  WBC 5.7 4.7  NEUTROABS 3.8 2.9  HGB 7.0* 6.9*  HCT 23.9* 22.8*  MCV 87.9 87.7  PLT 99* 95*   Cardiac Enzymes: No results for input(s): CKTOTAL, CKMB, CKMBINDEX, TROPONINI in the last 168 hours. BNP (last 3 results)  Recent Labs  08/16/14 1945  BNP 869.7*    ProBNP (last 3 results) No results for input(s): PROBNP in the last 8760 hours.  CBG:  Recent Labs Lab 03/23/15 1126  GLUCAP 104*    No results found for this or any previous visit (from the past 240 hour(s)).   Studies: Dg Pelvis 1-2 Views  03/23/2015  CLINICAL DATA:  Fall.  Right hip pain. EXAM: PELVIS - 1-2 VIEW COMPARISON:  None. FINDINGS: There is no evidence of pelvic fracture or diastasis. No pelvic bone lesions are seen. Minimal hypertrophic changes present at the superior acetabular margins in both hip joints. IMPRESSION: No pelvic fracture. Minimal osteoarthritis in the weight-bearing portions of both hip joints. Electronically Signed   By: Ilona Sorrel M.D.   On: 03/23/2015 08:17   Ct Head Wo Contrast  03/22/2015  CLINICAL DATA:  Trip and fall injury, subsequent  patient fell from stretcher onto the left side. Now with head and neck pain. Examination is limited as the patient had difficulty fitting into the gantry and was short of breath while laying flat. EXAM: CT HEAD WITHOUT CONTRAST CT CERVICAL SPINE WITHOUT CONTRAST TECHNIQUE: Multidetector CT imaging of the head and cervical spine was performed following the standard protocol without intravenous contrast. Multiplanar CT image reconstructions of the cervical spine were also generated. COMPARISON:  CT head 12/24/2014 FINDINGS: CT HEAD FINDINGS Diffuse cerebral atrophy. Mild ventricular dilatation consistent with central atrophy. Low-attenuation changes in the deep white matter consistent with small vessel ischemia. Basal ganglia calcifications. No mass effect or midline shift. No abnormal extra-axial fluid collections. Gray-white matter junctions are distinct. Basal cisterns are not effaced. No evidence of acute intracranial hemorrhage. No depressed skull fractures. Opacification of left maxillary antrum and some of the bilateral ethmoid air cells. Opacification of some of the mastoid air cells bilaterally. Calcification and deformity of the left globe. Vascular calcifications. CT CERVICAL SPINE FINDINGS Cervical spine examination is significantly limited due to motion artifact. There is mild anterior subluxation of C3 on C4. This is likely degenerative but ligamentous injury is not excluded. Diffuse degenerative change  throughout the cervical spine with narrowed cervical interspaces and endplate hypertrophic changes. Degenerative changes throughout the facet joints. Bone encroachment upon neural foramina bilaterally at multiple levels. No prevertebral soft tissue swelling. No vertebral compression deformities. C1-2 articulation appears intact. IMPRESSION: No acute intracranial abnormalities. Chronic atrophy and small vessel ischemic changes. Opacification of some of the paranasal sinuses. Examination of cervical spine  is significant limited due to motion artifact. There is no gross displaced fracture identified. However, there is anterior subluxation of C3 on C4. This is likely degenerative but ligamentous injury is not excluded. Diffuse degenerative changes throughout cervical spine. Electronically Signed   By: Lucienne Capers M.D.   On: 03/22/2015 21:13   Ct Cervical Spine Wo Contrast  03/22/2015  CLINICAL DATA:  Trip and fall injury, subsequent patient fell from stretcher onto the left side. Now with head and neck pain. Examination is limited as the patient had difficulty fitting into the gantry and was short of breath while laying flat. EXAM: CT HEAD WITHOUT CONTRAST CT CERVICAL SPINE WITHOUT CONTRAST TECHNIQUE: Multidetector CT imaging of the head and cervical spine was performed following the standard protocol without intravenous contrast. Multiplanar CT image reconstructions of the cervical spine were also generated. COMPARISON:  CT head 12/24/2014 FINDINGS: CT HEAD FINDINGS Diffuse cerebral atrophy. Mild ventricular dilatation consistent with central atrophy. Low-attenuation changes in the deep white matter consistent with small vessel ischemia. Basal ganglia calcifications. No mass effect or midline shift. No abnormal extra-axial fluid collections. Gray-white matter junctions are distinct. Basal cisterns are not effaced. No evidence of acute intracranial hemorrhage. No depressed skull fractures. Opacification of left maxillary antrum and some of the bilateral ethmoid air cells. Opacification of some of the mastoid air cells bilaterally. Calcification and deformity of the left globe. Vascular calcifications. CT CERVICAL SPINE FINDINGS Cervical spine examination is significantly limited due to motion artifact. There is mild anterior subluxation of C3 on C4. This is likely degenerative but ligamentous injury is not excluded. Diffuse degenerative change throughout the cervical spine with narrowed cervical interspaces  and endplate hypertrophic changes. Degenerative changes throughout the facet joints. Bone encroachment upon neural foramina bilaterally at multiple levels. No prevertebral soft tissue swelling. No vertebral compression deformities. C1-2 articulation appears intact. IMPRESSION: No acute intracranial abnormalities. Chronic atrophy and small vessel ischemic changes. Opacification of some of the paranasal sinuses. Examination of cervical spine is significant limited due to motion artifact. There is no gross displaced fracture identified. However, there is anterior subluxation of C3 on C4. This is likely degenerative but ligamentous injury is not excluded. Diffuse degenerative changes throughout cervical spine. Electronically Signed   By: Lucienne Capers M.D.   On: 03/22/2015 21:13   [name] Cervical Spine Wo Contrast  03/23/2015  CLINICAL DATA:  Status post trip and fall 03/22/2015. Increasing diffuse weakness. Question ligamentous injury at C3-4. Initial encounter. EXAM: MRI CERVICAL SPINE WITHOUT CONTRAST TECHNIQUE: Multiplanar, multisequence [name] imaging of the cervical spine was performed. No intravenous contrast was administered. COMPARISON:  CT cervical spine 03/22/2015. FINDINGS: The study is degraded by patient motion. Vertebral body height and signal are maintained. Trace anterolisthesis C3 on C4 is due to facet arthropathy. No evidence of ligamentous injury is seen. The craniocervical junction is normal and cervical cord signal is normal. Imaged paraspinous structures are unremarkable. Mucosal thickening is noted in the right maxillary sinus. C2-3: Facet degenerative disease is seen. The central canal and foramina appear open. C3-4: Facet arthropathy is seen. The disc is uncovered without bulging. The central canal is  open. There is likely moderate bilateral foraminal narrowing although evaluation is degraded by motion. C4-5: Mild posterior bony ridging and uncovertebral disease. The central canal is open. There  appears to be bilateral foraminal narrowing. C5-6: Small disc osteophyte complex, facet arthropathy and uncovertebral disease are seen. The ventral thecal sac is nearly effaced. The foramina are narrowed. C6-7: Small disc osteophyte complex effaces the ventral thecal sac. Uncovertebral disease appears worse on the left there is left worse than right foraminal narrowing. C7-T1: Minimal disc bulge without central canal or foraminal stenosis. IMPRESSION: Trace anterolisthesis C3 on C4 seen on comparison CT scan is due to facet degenerative disease. No evidence of posttraumatic change is present on this examination. Mild multilevel disc bulging as described above most notable at C5-6 and C6-7 where the ventral thecal sac is effaced. There appears to be multilevel foraminal narrowing. Evaluation is limited by patient motion. Electronically Signed   By: Inge Rise M.D.   On: 03/23/2015 13:07   Dg Chest Port 1 View  03/23/2015  CLINICAL DATA:  Shortness of breath, acute renal failure. EXAM: PORTABLE CHEST 1 VIEW COMPARISON:  December 24, 2014. FINDINGS: Stable cardiomegaly is noted. No pneumothorax or significant pleural effusion is noted. Mild diffuse interstitial densities are noted throughout both lungs, right greater than left, concerning for mild pulmonary edema. Stable mild central pulmonary vascular congestion is noted. Bony thorax is unremarkable. IMPRESSION: Stable cardiomegaly and central pulmonary vascular congestion is noted, with mild diffuse interstitial densities throughout both lungs concerning for pulmonary edema. Electronically Signed   By: Marijo Conception, M.D.   On: 03/23/2015 07:46   Dg Knee Complete 4 Views Right  03/22/2015  CLINICAL DATA:  Initial evaluation for acute trauma, fall. EXAM: RIGHT KNEE - COMPLETE 4+ VIEW COMPARISON:  None. FINDINGS: Diffuse osteopenia present, somewhat limiting evaluation for subtle nondisplaced fractures. No acute fracture or dislocation identified. No  joint effusion. Severe tricompartmental degenerative osteoarthrosis. Prepatellar soft tissue swelling. IMPRESSION: 1. No acute fracture or dislocation. 2. Prepatellar soft tissue swelling. 3. Severe tricompartmental degenerative osteoarthrosis. 4. Diffuse osteopenia. Electronically Signed   By: Jeannine Boga M.D.   On: 03/22/2015 21:15   Dg Hand Complete Left  03/22/2015  CLINICAL DATA:  Status post fall at home while walking. Left hand swelling. Initial encounter. EXAM: LEFT HAND - COMPLETE 3+ VIEW COMPARISON:  None. FINDINGS: There is no evidence of fracture or dislocation. Joint space narrowing is noted at the first carpometacarpal joint, and there is mild diffuse osteopenia of visualized osseous structures. The carpal rows are intact, and demonstrate normal alignment. Diffuse soft tissue swelling is noted about the wrist. IMPRESSION: No evidence of fracture or dislocation. Electronically Signed   By: Garald Balding M.D.   On: 03/22/2015 21:17   Dg Hand Complete Right  03/22/2015  CLINICAL DATA:  Patient fell at home while walking. Swelling in both hands. EXAM: RIGHT HAND - COMPLETE 3+ VIEW COMPARISON:  None. FINDINGS: Degenerative changes throughout the joints of the right hand and wrist. No evidence of acute fracture or subluxation. No focal bone lesion or bone destruction. Bone cortex and trabecular architecture appear intact. No radiopaque soft tissue foreign bodies. IMPRESSION: Prominent diffuse degenerative changes throughout the right hand and wrist. No acute fractures identified. Electronically Signed   By: Lucienne Capers M.D.   On: 03/22/2015 21:16    Scheduled Meds: . sodium chloride   Intravenous Once  . amLODipine  10 mg Oral Daily  . calcium-vitamin D  1 tablet Oral BID  .  cholecalciferol  5,000 Units Oral Daily  . fluticasone  2 spray Each Nare Daily  . insulin aspart  0-9 Units Subcutaneous TID WC  . insulin detemir  15 Units Subcutaneous QHS  . labetalol  200 mg Oral  BID  . latanoprost  1 drop Right Eye QHS  . loratadine  10 mg Oral Daily  . [START ON 03/25/2015] metolazone  2.5 mg Oral Once per day on Sun Thu  . multivitamin with minerals  1 tablet Oral Daily  . potassium chloride SA  40 mEq Oral Daily  . sertraline  25 mg Oral QHS  . sodium chloride  3 mL Intravenous Q12H  . spironolactone  25 mg Oral Daily  . tamsulosin  0.4 mg Oral QHS  . torsemide  40 mg Oral Q supper  . torsemide  60 mg Oral Daily  . vitamin B-12  1,000 mcg Oral Daily   Continuous Infusions:   Principal Problem:   Acute on chronic renal insufficiency (HCC) Active Problems:   Essential hypertension, benign   CAD- mild non obstructive Oct 2006   Chronic diastolic heart failure (HCC)   Blindness of left eye   Chronic anemia   Guaiac positive stools   Thrombocytopenia (HCC)   Renal failure (ARF), acute on chronic ([place])    Time spent:     Kelvin Cellar  [facility] Pager [phone]. If 7PM-7AM, please contact night-coverage at [email or web address], password [email or web address] 03/23/2015, 3:51 PM  LOS: 0 days

## 2015-03-23 NOTE — Discharge Instructions (Signed)
Contusion °A contusion is a deep bruise. Contusions are the result of a blunt injury to tissues and muscle fibers under the skin. The injury causes bleeding under the skin. The skin overlying the contusion may turn blue, purple, or yellow. Minor injuries will give you a painless contusion, but more severe contusions may stay painful and swollen for a few weeks.  °CAUSES  °This condition is usually caused by a blow, trauma, or direct force to an area of the body. °SYMPTOMS  °Symptoms of this condition include: °· Swelling of the injured area. °· Pain and tenderness in the injured area. °· Discoloration. The area may have redness and then turn blue, purple, or yellow. °DIAGNOSIS  °This condition is diagnosed based on a physical exam and medical history. An X-ray, CT scan, or MRI may be needed to determine if there are any associated injuries, such as broken bones (fractures). °TREATMENT  °Specific treatment for this condition depends on what area of the body was injured. In general, the best treatment for a contusion is resting, icing, applying pressure to (compression), and elevating the injured area. This is often called the RICE strategy. Over-the-counter anti-inflammatory medicines may also be recommended for pain control.  °HOME CARE INSTRUCTIONS  °· Rest the injured area. °· If directed, apply ice to the injured area: °· Put ice in a plastic bag. °· Place a towel between your skin and the bag. °· Leave the ice on for 20 minutes, 2-3 times per day. °· If directed, apply light compression to the injured area using an elastic bandage. Make sure the bandage is not wrapped too tightly. Remove and reapply the bandage as directed by your health care provider. °· If possible, raise (elevate) the injured area above the level of your heart while you are sitting or lying down. °· Take over-the-counter and prescription medicines only as told by your health care provider. °SEEK MEDICAL CARE IF: °· Your symptoms do not  improve after several days of treatment. °· Your symptoms get worse. °· You have difficulty moving the injured area. °SEEK IMMEDIATE MEDICAL CARE IF:  °· You have severe pain. °· You have numbness in a hand or foot. °· Your hand or foot turns pale or cold. °  °This information is not intended to replace advice given to you by your health care provider. Make sure you discuss any questions you have with your health care provider. °  °Document Released: 01/22/2005 Document Revised: 01/03/2015 Document Reviewed: 08/30/2014 °[employer] Interactive Patient Education ©2016 [employer] °Cervical Sprain °A cervical sprain is an injury in the neck in which the strong, fibrous tissues (ligaments) that connect your neck bones stretch or tear. Cervical sprains can range from mild to severe. Severe cervical sprains can cause the neck vertebrae to be unstable. This can lead to damage of the spinal cord and can result in serious nervous system problems. The amount of time it takes for a cervical sprain to get better depends on the cause and extent of the injury. Most cervical sprains heal in 1 to 3 weeks. °CAUSES  °Severe cervical sprains may be caused by:  °· Contact sport injuries (such as from football, rugby, wrestling, hockey, auto racing, gymnastics, diving, martial arts, or boxing).   °· Motor vehicle collisions.   °· Whiplash injuries. This is an injury from a sudden forward and backward whipping movement of the head and neck.  °· Falls.   °Mild cervical sprains may be caused by:  °· Being in an awkward position, such as while   cradling a telephone between your ear and shoulder.   °· Sitting in a chair that does not offer proper support.   °· Working at a poorly designed computer station.   °· Looking up or down for long periods of time.   °SYMPTOMS  °· Pain, soreness, stiffness, or a burning sensation in the front, back, or sides of the neck. This discomfort may develop immediately after the injury or slowly, 24 hours or  more after the injury.   °· Pain or tenderness directly in the middle of the back of the neck.   °· Shoulder or upper back pain.   °· Limited ability to move the neck.   °· Headache.   °· Dizziness.   °· Weakness, numbness, or tingling in the hands or arms.   °· Muscle spasms.   °· Difficulty swallowing or chewing.   °· Tenderness and swelling of the neck.   °DIAGNOSIS  °Most of the time your health care provider can diagnose a cervical sprain by taking your history and doing a physical exam. Your health care provider will ask about previous neck injuries and any known neck problems, such as arthritis in the neck. X-rays may be taken to find out if there are any other problems, such as with the bones of the neck. Other tests, such as a CT scan or MRI, may also be needed.  °TREATMENT  °Treatment depends on the severity of the cervical sprain. Mild sprains can be treated with rest, keeping the neck in place (immobilization), and pain medicines. Severe cervical sprains are immediately immobilized. Further treatment is done to help with pain, muscle spasms, and other symptoms and may include: °· Medicines, such as pain relievers, numbing medicines, or muscle relaxants.   °· Physical therapy. This may involve stretching exercises, strengthening exercises, and posture training. Exercises and improved posture can help stabilize the neck, strengthen muscles, and help stop symptoms from returning.   °HOME CARE INSTRUCTIONS  °· Put ice on the injured area.   °¨ Put ice in a plastic bag.   °¨ Place a towel between your skin and the bag.   °¨ Leave the ice on for 15-20 minutes, 3-4 times a day.   °· If your injury was severe, you may have been given a cervical collar to wear. A cervical collar is a two-piece collar designed to keep your neck from moving while it heals. °¨ Do not remove the collar unless instructed by your health care provider. °¨ If you have long hair, keep it outside of the collar. °¨ Ask your health care  provider before making any adjustments to your collar. Minor adjustments may be required over time to improve comfort and reduce pressure on your chin or on the back of your head. °¨ If you are allowed to remove the collar for cleaning or bathing, follow your health care provider's instructions on how to do so safely. °¨ Keep your collar clean by wiping it with mild soap and water and drying it completely. If the collar you have been given includes removable pads, remove them every 1-2 days and hand wash them with soap and water. Allow them to air dry. They should be completely dry before you wear them in the collar. °¨ If you are allowed to remove the collar for cleaning and bathing, wash and dry the skin of your neck. Check your skin for irritation or sores. If you see any, tell your health care provider. °¨ Do not drive while wearing the collar.   °· Only take over-the-counter or prescription medicines for pain, discomfort, or fever as directed by your health care   provider.   °· Keep all follow-up appointments as directed by your health care provider.   °· Keep all physical therapy appointments as directed by your health care provider.   °· Make any needed adjustments to your workstation to promote good posture.   °· Avoid positions and activities that make your symptoms worse.   °· Warm up and stretch before being active to help prevent problems.   °SEEK MEDICAL CARE IF:  °· Your pain is not controlled with medicine.   °· You are unable to decrease your pain medicine over time as planned.   °· Your activity level is not improving as expected.   °SEEK IMMEDIATE MEDICAL CARE IF:  °· You develop any bleeding. °· You develop stomach upset. °· You have signs of an allergic reaction to your medicine.   °· Your symptoms get worse.   °· You develop new, unexplained symptoms.   °· You have numbness, tingling, weakness, or paralysis in any part of your body.   °MAKE SURE YOU:  °· Understand these instructions. °· Will  watch your condition. °· Will get help right away if you are not doing well or get worse. °  °This information is not intended to replace advice given to you by your health care provider. Make sure you discuss any questions you have with your health care provider. °  °Document Released: 02/09/2007 Document Revised: 04/19/2013 Document Reviewed: 10/20/2012 °[employer] Interactive Patient Education ©2016 [employer] ° °
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
## 2015-03-23 NOTE — Patient Outreach (Signed)
[facility]) Care Management  03/23/2015  JAKYRI BRUNKHORST [birth year] [id number]   Mr. Virlan Kempker is an 81 year old gentleman with with a PMH of morbid obesity, chronic diastolic heart failure, HTN, SVT/PSVT/PAT, CAD, DMII, OSA-CPAP, arthritis, fibromyalgia, and hyperlipidemia. Mr. Earll lives at home with his wife in [place]. He has a ramp, accessible van, accessible bathroom, and strong family support.   [facility] Care Management was consulted to assist with management of CHF after an admission for acute on chronic HF in April. Mr. Wojcicki was seen briefly at the [facility] but did not keep his subsequent appointments.  Mr. Soberano recently met with Dr. Edrick Oh who explained to Mr. Yanes about the progression of his CKD and offered information about usual progression of disease and possible choices Mr. Miley may have to make over the course of the next year (Dialysis vs medical treatment which could eventually include hospice care). I followed up on this conversation with Mr. Debruin during a subsequent home visit. When we last spoke, Mr. Koskela did not want to discuss his kidney disease progression.   Mr. Mcevers and his wife enjoy the support of a daughter and son who have been very engaged in Mr. Peretti's care. His children and grand children have been very supportive and attentive.  I will follow Mr. Horkey's hospital progress closely.    [facility]  [phone]

## 2015-03-24 ENCOUNTER — Inpatient Hospital Stay (HOSPITAL_COMMUNITY): Payer: Medicare Other

## 2015-03-24 DIAGNOSIS — R195 Other fecal abnormalities: Secondary | ICD-10-CM

## 2015-03-24 DIAGNOSIS — D649 Anemia, unspecified: Secondary | ICD-10-CM

## 2015-03-24 DIAGNOSIS — I5032 Chronic diastolic (congestive) heart failure: Secondary | ICD-10-CM

## 2015-03-24 DIAGNOSIS — I10 Essential (primary) hypertension: Secondary | ICD-10-CM

## 2015-03-24 LAB — CREATININE, URINE, RANDOM: Creatinine, Urine: 105.27 mg/dL

## 2015-03-24 LAB — TYPE AND SCREEN
ABO/RH(D): O POS
Antibody Screen: NEGATIVE
UNIT DIVISION: 0
Unit division: 0

## 2015-03-24 LAB — URINALYSIS, ROUTINE W REFLEX MICROSCOPIC
BILIRUBIN URINE: NEGATIVE
Bilirubin Urine: NEGATIVE
GLUCOSE, UA: NEGATIVE mg/dL
Glucose, UA: NEGATIVE mg/dL
HGB URINE DIPSTICK: NEGATIVE
HGB URINE DIPSTICK: NEGATIVE
KETONES UR: NEGATIVE mg/dL
KETONES UR: NEGATIVE mg/dL
LEUKOCYTES UA: NEGATIVE
Leukocytes, UA: NEGATIVE
NITRITE: NEGATIVE
Nitrite: NEGATIVE
PH: 5.5 (ref 5.0–8.0)
Protein, ur: NEGATIVE mg/dL
Protein, ur: NEGATIVE mg/dL
SPECIFIC GRAVITY, URINE: 1.013 (ref 1.005–1.030)
Specific Gravity, Urine: 1.014 (ref 1.005–1.030)
pH: 5.5 (ref 5.0–8.0)

## 2015-03-24 LAB — CBC
HCT: 25.6 % — ABNORMAL LOW (ref 39.0–52.0)
HEMATOCRIT: 25.6 % — AB (ref 39.0–52.0)
Hemoglobin: 7.8 g/dL — ABNORMAL LOW (ref 13.0–17.0)
Hemoglobin: 8 g/dL — ABNORMAL LOW (ref 13.0–17.0)
MCH: 26.5 pg (ref 26.0–34.0)
MCH: 27.2 pg (ref 26.0–34.0)
MCHC: 30.5 g/dL (ref 30.0–36.0)
MCHC: 31.3 g/dL (ref 30.0–36.0)
MCV: 87.1 fL (ref 78.0–100.0)
MCV: 87.1 fL (ref 78.0–100.0)
PLATELETS: 96 10*3/uL — AB (ref 150–400)
PLATELETS: 92 10*3/uL — AB (ref 150–400)
RBC: 2.94 MIL/uL — AB (ref 4.22–5.81)
RBC: 2.94 MIL/uL — ABNORMAL LOW (ref 4.22–5.81)
RDW: 18.2 % — ABNORMAL HIGH (ref 11.5–15.5)
RDW: 18.2 % — AB (ref 11.5–15.5)
WBC: 5.4 10*3/uL (ref 4.0–10.5)
WBC: 4.6 10*3/uL (ref 4.0–10.5)

## 2015-03-24 LAB — GLUCOSE, CAPILLARY
GLUCOSE-CAPILLARY: 114 mg/dL — AB (ref 65–99)
GLUCOSE-CAPILLARY: 172 mg/dL — AB (ref 65–99)
GLUCOSE-CAPILLARY: 129 mg/dL — AB (ref 65–99)
Glucose-Capillary: 135 mg/dL — ABNORMAL HIGH (ref 65–99)

## 2015-03-24 LAB — IRON AND TIBC
Iron: 20 ug/dL — ABNORMAL LOW (ref 45–182)
SATURATION RATIOS: 6 % — AB (ref 17.9–39.5)
TIBC: 360 ug/dL (ref 250–450)
UIBC: 340 ug/dL

## 2015-03-24 LAB — URIC ACID: Uric Acid, Serum: 13.3 mg/dL — ABNORMAL HIGH (ref 4.4–7.6)

## 2015-03-24 LAB — BASIC METABOLIC PANEL
Anion gap: 9 (ref 5–15)
BUN: 76 mg/dL — AB (ref 6–20)
CO2: 23 mmol/L (ref 22–32)
CREATININE: 3.29 mg/dL — AB (ref 0.61–1.24)
Calcium: 8.6 mg/dL — ABNORMAL LOW (ref 8.9–10.3)
Chloride: 105 mmol/L (ref 101–111)
GFR calc Af Amer: 19 mL/min — ABNORMAL LOW (ref >60)
GFR, EST NON AFRICAN AMERICAN: 16 mL/min — AB (ref >60)
GLUCOSE: 119 mg/dL — AB (ref 65–99)
POTASSIUM: 4.3 mmol/L (ref 3.5–5.1)
SODIUM: 137 mmol/L (ref 135–145)

## 2015-03-24 LAB — SODIUM, URINE, RANDOM: SODIUM UR: 12 mmol/L

## 2015-03-24 LAB — CK: CK TOTAL: 319 U/L (ref 49–397)

## 2015-03-24 MED ORDER — OXYCODONE HCL 5 MG PO TABS
5.0000 mg | ORAL_TABLET | Freq: Four times a day (QID) | ORAL | Status: DC | PRN
  Administered 2015-03-24 – 2015-03-26 (×4): 5 mg via ORAL
  Filled 2015-03-24 (×4): qty 1

## 2015-03-24 NOTE — Progress Notes (Signed)
Patient was on his home CPAP via his nasal pillows when RT entered room.  Patient stated he didn't need anything at this time.  RT will continue to monitor.
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
## 2015-03-24 NOTE — Progress Notes (Signed)
Patient wants something stronger for pain- MD Coralyn Pear paged.

## 2015-03-24 NOTE — Progress Notes (Signed)
[facility] PROGRESS NOTE  [name] NLZ:[id number] DOB: [birth year] DOA: 03/22/2015 PCP: Wende Neighbors, MD  Assessment/Plan: 1. Symptomatic anemia -[name] having a history of normocytic anemia thought to be secondary to chronic kidney disease, and had been undergoing infusion with IV iron in the outpatient setting. -He reports worsening generalized weakness associate with shortness of breath having a fall yesterday at home. -Labs showing hemoglobin of 6.9. Prior to this labs indicate that he had a hemoglobin of 8.3 on 02/26/2015. -He denies bright red blood per rectum or hematemesis. -Previous workup included electrophoresis which did not reveal multiple myeloma. -On 03/24/2015 after transfusion with 2 units of packed red blood cells a.m. labs revealing hemoglobin of 7.8 with hematocrit of 25.6, from hemoglobin of 6.9. From a symptomatic standpoint he reports feeling a little better. -History was working guaiac positive. Spoke to family members about this it appears he has a history of guaiac-positive stools for which he recently underwent a colonoscopy in September 2016 in [place]. Patient and family members reporting that "everything came out good" from that study. Medical records unavailable at the time of this dictation. -Will repeat CBC in a.m.  2.  Acute on chronic renal failure -Lab work showing creatinine of 3.22 with BUN is 72. Lab work from 02/26/2015 revealed a creatinine of 2.3 with BUN of 48.4. He is being administered volume through transfusion of packed red blood cells -Despite administration of volume from blood transfusion his creatinine remains elevated at 2.29. -His urinalysis was unremarkable. -Case was discussed with Dr. Jimmy Footman of nephrology. Patient currently follows Dr. Justin Mend at the Nephrology clinic.   3.  Status post fall -Patient reporting a fall at home which attributes to generalized weakness. -Will reassess after transfusion with 2 units packed red blood  cells -Physical therapy consultation placed who recommended rehabilitation at skilled nursing facility. Patient insistent on going home after this hospitalization.  4.  Chronic combined systolic and diastolic congestive heart failure -Last transthoracic echocardiogram was performed on 08/18/2014 which showed ejection fraction 45-50% with grade 2 diastolic dysfunction -Will monitor closely as he has been transfused with 2 units packed red blood cells today, currently does not have clinical evidence of acute decompensated congestive heart failure -Continue torsemide 40 mg by mouth in evening, 60 mg by mouth in a.m. as well as spironolactone 25 mg by mouth daily.  5.  Hypertension -His blood pressures on the low normal side on 03/23/2015 for which Norvasc was discontinued. -Blood pressures seem improved on 03/24/2015. Will continue holding Norvasc  Code Status: Full code Family Communication: I spoke with family members were present at bedside Disposition Plan: Despite receiving blood transfusion creatinine remains elevated, nephrology consulted today.   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 He reports having increasing generalized weakness associate with shortness of breath. He reported Halina fall at home while going to the bathroom stating his legs gave out. Denied loss of consciousness. Workup in the emergency department which included a CT scan of brain, MRI of C-spine, x-ray of hip were unremarkable. No evidence of fracture. Labs did reveal hemoglobin of 6.9, down from 8.3 on 02/26/2015. Labs also revealed the development of acute kidney injury having  creatinine of 3.22, increased  from 2.3 on 02/26/2015. Symptoms felt to be secondary to anemia as he was typed and crossed and transfused with 2 units of packed red blood cells.  Objective: Filed Vitals:   03/24/15 0554 03/24/15 0831  BP: 127/50 139/55  Pulse: 64 75  Temp: 98.7 F (37.1 C) 98.8 F (37.1 C)  Resp: 18 20    Intake/Output Summary (Last 24 hours) at 03/24/15 1543 Last data filed at 03/24/15 1426  Gross per 24 hour  Intake   1135 ml  Output   1551 ml  Net   -416 ml   Filed Weights   03/23/15 0512 03/23/15 2036  Weight: 177.7 kg (391 lb 12.1 oz) 179.1 kg (394 lb 13.5 oz)    Exam:   General:  Patient is awake and alert, no acute distress, states feeling better after blood transfusion  Cardiovascular: 2/6 systolic ejection murmur, normal S1-S2  Respiratory: Diminished breath sounds bilaterally, suspect related to body habitus, I did not auscultate crackles, rales, rhonchi  Abdomen: Obese, soft nontender nondistended  Musculoskeletal: Venous stasis changes involving bilateral extremities  Data Reviewed: Basic Metabolic Panel:  Recent Labs Lab 03/23/15 0118 03/23/15 0644 03/24/15 0520  NA 140 140 137  K 4.4 4.3 4.3  CL 107 106 105  CO2 _0 GLUCOSE 107* 95 119*  BUN 72* 73* 76*  CREATININE 3.22* 3.27* 3.29*  CALCIUM 8.8* 8.7* 8.6*   Liver Function Tests:  Recent Labs Lab 03/23/15 0118 03/23/15 0644  AST 36 35  ALT 23 23  ALKPHOS 58 59  BILITOT 1.4* 1.1  PROT 5.7* 5.5*  ALBUMIN 3.3* 3.2*   No results for input(s): LIPASE, AMYLASE in the last 168 hours. No results for input(s): AMMONIA in the last 168 hours. CBC:  Recent Labs Lab 03/23/15 0118 03/23/15 0644 03/23/15 2305 03/24/15 0520  WBC 5.7 4.7  --  5.4  NEUTROABS 3.8 2.9  --   --   HGB 7.0* 6.9* 7.9* 7.8*  HCT 23.9* 22.8* 25.7* 25.6*  MCV 87.9 87.7  --  87.1  PLT 99* 95*  --  96*   Cardiac Enzymes: No results for input(s): CKTOTAL, CKMB, CKMBINDEX, TROPONINI in the  last 168 hours. BNP (last 3 results)  Recent Labs  08/16/14 1945  BNP 869.7*    ProBNP (last 3 results) No results for input(s): PROBNP in the last 8760 hours.  CBG:  Recent Labs Lab 03/23/15 1126 03/23/15 1653 03/23/15 2042 03/24/15 0744 03/24/15 1126  GLUCAP 104* 138* 162* 114* 135*    No results found for this or any previous visit (from the past 240 hour(s)).   Studies: Dg Pelvis 1-2 Views  03/23/2015  CLINICAL DATA:  Fall.  Right hip pain. EXAM: PELVIS - 1-2 VIEW COMPARISON:  None. FINDINGS: There is no evidence of pelvic fracture or diastasis. No pelvic bone lesions are seen. Minimal hypertrophic changes present at the superior acetabular margins in both hip joints. IMPRESSION: No pelvic fracture. Minimal osteoarthritis in the weight-bearing portions of both hip joints. Electronically Signed   By: Ilona Sorrel M.D.   On: 03/23/2015 08:17   Ct Head Wo Contrast  03/22/2015  CLINICAL DATA:  Trip and fall injury, subsequent patient fell from stretcher onto the left side. Now with head and neck pain. Examination is limited as the patient had difficulty fitting into the gantry and was short of breath while laying flat. EXAM: CT HEAD WITHOUT CONTRAST CT CERVICAL SPINE WITHOUT CONTRAST TECHNIQUE: Multidetector CT imaging of the head and  cervical spine was performed following the standard protocol without intravenous contrast. Multiplanar CT image reconstructions of the cervical spine were also generated. COMPARISON:  CT head 12/24/2014 FINDINGS: CT HEAD FINDINGS Diffuse cerebral atrophy. Mild ventricular dilatation consistent with central atrophy. Low-attenuation changes in the deep white matter consistent with small vessel ischemia. Basal ganglia calcifications. No mass effect or midline shift. No abnormal extra-axial fluid collections. Gray-white matter junctions are distinct. Basal cisterns are not effaced. No evidence of acute intracranial hemorrhage. No depressed skull fractures.  Opacification of left maxillary antrum and some of the bilateral ethmoid air cells. Opacification of some of the mastoid air cells bilaterally. Calcification and deformity of the left globe. Vascular calcifications. CT CERVICAL SPINE FINDINGS Cervical spine examination is significantly limited due to motion artifact. There is mild anterior subluxation of C3 on C4. This is likely degenerative but ligamentous injury is not excluded. Diffuse degenerative change throughout the cervical spine with narrowed cervical interspaces and endplate hypertrophic changes. Degenerative changes throughout the facet joints. Bone encroachment upon neural foramina bilaterally at multiple levels. No prevertebral soft tissue swelling. No vertebral compression deformities. C1-2 articulation appears intact. IMPRESSION: No acute intracranial abnormalities. Chronic atrophy and small vessel ischemic changes. Opacification of some of the paranasal sinuses. Examination of cervical spine is significant limited due to motion artifact. There is no gross displaced fracture identified. However, there is anterior subluxation of C3 on C4. This is likely degenerative but ligamentous injury is not excluded. Diffuse degenerative changes throughout cervical spine. Electronically Signed   By: Lucienne Capers M.D.   On: 03/22/2015 21:13   Ct Cervical Spine Wo Contrast  03/22/2015  CLINICAL DATA:  Trip and fall injury, subsequent patient fell from stretcher onto the left side. Now with head and neck pain. Examination is limited as the patient had difficulty fitting into the gantry and was short of breath while laying flat. EXAM: CT HEAD WITHOUT CONTRAST CT CERVICAL SPINE WITHOUT CONTRAST TECHNIQUE: Multidetector CT imaging of the head and cervical spine was performed following the standard protocol without intravenous contrast. Multiplanar CT image reconstructions of the cervical spine were also generated. COMPARISON:  CT head 12/24/2014 FINDINGS: CT  HEAD FINDINGS Diffuse cerebral atrophy. Mild ventricular dilatation consistent with central atrophy. Low-attenuation changes in the deep white matter consistent with small vessel ischemia. Basal ganglia calcifications. No mass effect or midline shift. No abnormal extra-axial fluid collections. Gray-white matter junctions are distinct. Basal cisterns are not effaced. No evidence of acute intracranial hemorrhage. No depressed skull fractures. Opacification of left maxillary antrum and some of the bilateral ethmoid air cells. Opacification of some of the mastoid air cells bilaterally. Calcification and deformity of the left globe. Vascular calcifications. CT CERVICAL SPINE FINDINGS Cervical spine examination is significantly limited due to motion artifact. There is mild anterior subluxation of C3 on C4. This is likely degenerative but ligamentous injury is not excluded. Diffuse degenerative change throughout the cervical spine with narrowed cervical interspaces and endplate hypertrophic changes. Degenerative changes throughout the facet joints. Bone encroachment upon neural foramina bilaterally at multiple levels. No prevertebral soft tissue swelling. No vertebral compression deformities. C1-2 articulation appears intact. IMPRESSION: No acute intracranial abnormalities. Chronic atrophy and small vessel ischemic changes. Opacification of some of the paranasal sinuses. Examination of cervical spine is significant limited due to motion artifact. There is no gross displaced fracture identified. However, there is anterior subluxation of C3 on C4. This is likely degenerative but ligamentous injury is not excluded. Diffuse degenerative changes throughout cervical spine. Electronically Signed  By: Lucienne Capers M.D.   On: 03/22/2015 21:13   Mr Cervical Spine Wo Contrast  03/23/2015  CLINICAL DATA:  Status post trip and fall 03/22/2015. Increasing diffuse weakness. Question ligamentous injury at C3-4. Initial encounter.  EXAM: MRI CERVICAL SPINE WITHOUT CONTRAST TECHNIQUE: Multiplanar, multisequence MR imaging of the cervical spine was performed. No intravenous contrast was administered. COMPARISON:  CT cervical spine 03/22/2015. FINDINGS: The study is degraded by patient motion. Vertebral body height and signal are maintained. Trace anterolisthesis C3 on C4 is due to facet arthropathy. No evidence of ligamentous injury is seen. The craniocervical junction is normal and cervical cord signal is normal. Imaged paraspinous structures are unremarkable. Mucosal thickening is noted in the right maxillary sinus. C2-3: Facet degenerative disease is seen. The central canal and foramina appear open. C3-4: Facet arthropathy is seen. The disc is uncovered without bulging. The central canal is open. There is likely moderate bilateral foraminal narrowing although evaluation is degraded by motion. C4-5: Mild posterior bony ridging and uncovertebral disease. The central canal is open. There appears to be bilateral foraminal narrowing. C5-6: Small disc osteophyte complex, facet arthropathy and uncovertebral disease are seen. The ventral thecal sac is nearly effaced. The foramina are narrowed. C6-7: Small disc osteophyte complex effaces the ventral thecal sac. Uncovertebral disease appears worse on the left there is left worse than right foraminal narrowing. C7-T1: Minimal disc bulge without central canal or foraminal stenosis. IMPRESSION: Trace anterolisthesis C3 on C4 seen on comparison CT scan is due to facet degenerative disease. No evidence of posttraumatic change is present on this examination. Mild multilevel disc bulging as described above most notable at C5-6 and C6-7 where the ventral thecal sac is effaced. There appears to be multilevel foraminal narrowing. Evaluation is limited by patient motion. Electronically Signed   By: Inge Rise M.D.   On: 03/23/2015 13:07   Dg Chest Port 1 View  03/23/2015  CLINICAL DATA:  Shortness of  breath, acute renal failure. EXAM: PORTABLE CHEST 1 VIEW COMPARISON:  December 24, 2014. FINDINGS: Stable cardiomegaly is noted. No pneumothorax or significant pleural effusion is noted. Mild diffuse interstitial densities are noted throughout both lungs, right greater than left, concerning for mild pulmonary edema. Stable mild central pulmonary vascular congestion is noted. Bony thorax is unremarkable. IMPRESSION: Stable cardiomegaly and central pulmonary vascular congestion is noted, with mild diffuse interstitial densities throughout both lungs concerning for pulmonary edema. Electronically Signed   By: Marijo Conception, M.D.   On: 03/23/2015 07:46   Dg Knee Complete 4 Views Right  03/22/2015  CLINICAL DATA:  Initial evaluation for acute trauma, fall. EXAM: RIGHT KNEE - COMPLETE 4+ VIEW COMPARISON:  None. FINDINGS: Diffuse osteopenia present, somewhat limiting evaluation for subtle nondisplaced fractures. No acute fracture or dislocation identified. No joint effusion. Severe tricompartmental degenerative osteoarthrosis. Prepatellar soft tissue swelling. IMPRESSION: 1. No acute fracture or dislocation. 2. Prepatellar soft tissue swelling. 3. Severe tricompartmental degenerative osteoarthrosis. 4. Diffuse osteopenia. Electronically Signed   By: Jeannine Boga M.D.   On: 03/22/2015 21:15   Dg Hand Complete Left  03/22/2015  CLINICAL DATA:  Status post fall at home while walking. Left hand swelling. Initial encounter. EXAM: LEFT HAND - COMPLETE 3+ VIEW COMPARISON:  None. FINDINGS: There is no evidence of fracture or dislocation. Joint space narrowing is noted at the first carpometacarpal joint, and there is mild diffuse osteopenia of visualized osseous structures. The carpal rows are intact, and demonstrate normal alignment. Diffuse soft tissue swelling is noted about  the wrist. IMPRESSION: No evidence of fracture or dislocation. Electronically Signed   By: Garald Balding M.D.   On: 03/22/2015 21:17   Dg  Hand Complete Right  03/22/2015  CLINICAL DATA:  Patient fell at home while walking. Swelling in both hands. EXAM: RIGHT HAND - COMPLETE 3+ VIEW COMPARISON:  None. FINDINGS: Degenerative changes throughout the joints of the right hand and wrist. No evidence of acute fracture or subluxation. No focal bone lesion or bone destruction. Bone cortex and trabecular architecture appear intact. No radiopaque soft tissue foreign bodies. IMPRESSION: Prominent diffuse degenerative changes throughout the right hand and wrist. No acute fractures identified. Electronically Signed   By: Lucienne Capers M.D.   On: 03/22/2015 21:16    Scheduled Meds: . sodium chloride   Intravenous Once  . calcium-vitamin D  1 tablet Oral BID  . cholecalciferol  5,000 Units Oral Daily  . fluticasone  2 spray Each Nare Daily  . insulin aspart  0-9 Units Subcutaneous TID WC  . insulin detemir  15 Units Subcutaneous QHS  . labetalol  200 mg Oral BID  . latanoprost  1 drop Right Eye QHS  . loratadine  10 mg Oral Daily  . [START ON 03/25/2015] metolazone  2.5 mg Oral Once per day on Sun Thu  . multivitamin with minerals  1 tablet Oral Daily  . potassium chloride SA  40 mEq Oral Daily  . sertraline  25 mg Oral QHS  . sodium chloride  3 mL Intravenous Q12H  . spironolactone  25 mg Oral Daily  . tamsulosin  0.4 mg Oral QHS  . torsemide  40 mg Oral Q supper  . torsemide  60 mg Oral Daily  . vitamin B-12  1,000 mcg Oral Daily   Continuous Infusions:   Principal Problem:   Acute on chronic renal insufficiency (HCC) Active Problems:   Essential hypertension, benign   CAD- mild non obstructive Oct 2006   Chronic diastolic heart failure (HCC)   Blindness of left eye   Chronic anemia   Guaiac positive stools   Thrombocytopenia (HCC)   Renal failure (ARF), acute on chronic ([place])    Time spent: 35 min    Kelvin Cellar  [facility] Pager [phone]. If 7PM-7AM, please contact night-coverage at [email or web address],  password [facility] 03/24/2015, 3:43 PM  LOS: 1 day

## 2015-03-24 NOTE — Progress Notes (Signed)
Patient states that he feels like his potassium is low and would to be given some p.o. K+. MD Coralyn Pear to be paged

## 2015-03-24 NOTE — Progress Notes (Addendum)
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 Admitted with symptomatic anemia    PT Comments    Pt admitted with above diagnosis. Pt currently with functional limitations due to balance and endurance deficits. Pt needs min to mod assist of 2 and  cues for safety with ambulation as well as transfers.  Discussed at length, options with pt and his grandson for recommendation for Rehab.  Feel that pt will be stronger and safer with a Rehab stay prior to d/c home but pt is adamant that he is going home with wife.  Pt reports that they can manage fine.  If home, recommend max HH services as below as well as pt needs bariatric 3N1.   Pt will benefit from skilled PT to increase their independence and safety with mobility to allow discharge to the venue listed below.    Follow Up Recommendations  SNF;Supervision/Assistance - 24 hour (Refusing SNF.  If home, HHPT, HHOT, HHAide,HHSW)     Equipment Recommendations  Other (comment) (bariatric 3N1) Hospital bed   Recommendations for Other Services       Precautions / Restrictions Precautions Precautions: Fall Restrictions Weight Bearing Restrictions: No    Mobility  Bed Mobility Overal bed mobility: Needs Assistance Bed Mobility: Supine to Sit;Sit to Supine     Supine to sit: Min assist;+2 for physical assistance;HOB elevated Sit to supine: Min assist;HOB elevated   General bed mobility comments: HOB elevated, VC for technique. Heavy use of rail. Able to slowly move LEs off bed, but required min assist for UE support to pull self to seated position. Min  assist for LE support back into bed. Attempted to teach log roll technique however pt rushes and does not perform as explained.  Transfers Overall transfer level: Needs assistance Equipment used: Rolling walker (2 wheeled) Transfers: Sit to/from Stand Sit to Stand: +2 physical assistance;Mod assist         General transfer comment: Mod assist +2 for boost using bed pad under buttocks for support and pts hands on chair in front.  VC to shift weight anteriorly. Slow to rise. Had to pull up with one hand on RW with wide BOS.   Ambulation/Gait Ambulation/Gait assistance: Min assist;+2 physical assistance Ambulation Distance (Feet): 6 Feet Assistive device: Rolling walker (2 wheeled) Gait Pattern/deviations: Step-through pattern;Decreased stride length;Trunk flexed;Wide base of support;Drifts right/left   Gait velocity interpretation: Below normal speed for age/gender General Gait Details: Pt was able to ambulate  a short distance with assist and cues.  Needed cues for safety awareness with RW and to stay close to it.     Stairs            Wheelchair Mobility    Modified Rankin (Stroke Patients Only)       Balance Overall balance assessment: Needs assistance;History of Falls Sitting-balance support: No upper extremity supported;Feet supported Sitting balance-Leahy Scale: Good     Standing balance support: Bilateral upper extremity supported;During functional activity Standing balance-Leahy Scale: Poor Standing balance comment: Pt requiring UE support of RW in standing.                      Cognition  Arousal/Alertness: Awake/alert Behavior During Therapy: WFL for tasks assessed/performed Overall Cognitive Status: Impaired/Different from baseline Area of Impairment: Safety/judgement         Safety/Judgement: Decreased awareness of deficits     General Comments: Thinks he can go home despite inability to safely mobilize.    Exercises General Exercises -  Lower Extremity Ankle Circles/Pumps: AROM;Both;Supine;15 reps Long Arc Quad: AROM;Both;10 reps;Seated Hip Flexion/Marching: AROM;Both;10 reps;Seated    General Comments General comments (skin integrity, edema, etc.): Pt with dyspnea 4/4 with standing and ambulating.  Pulse ox 95%.        Pertinent Vitals/Pain Pain Assessment: Faces Faces Pain Scale: Hurts little more Pain Location: Bil LEs and left abdomen Pain Descriptors / Indicators: Grimacing;Sore Pain Intervention(s): Limited activity within patient's tolerance;Monitored during session;Repositioned  VSS    Home Living                      Prior Function            PT Goals (current goals can now be found in the care plan section) Progress towards PT goals: Progressing toward goals    Frequency  Min 3X/week    PT Plan Current plan remains appropriate    Co-evaluation             End of Session Equipment Utilized During Treatment: Gait belt Activity Tolerance: Patient limited by fatigue Patient left: in bed;with call bell/phone within reach;with bed alarm set;with family/visitor present     Time: 1129-1212 PT Time Calculation (min) (ACUTE ONLY): 43 min  Charges:  $Therapeutic Exercise: 8-22 mins $Therapeutic Activity: 23-37 mins                    G CodesDenice Paradise 13-Apr-2015, 2:21 PM Deerica Waszak,PT Acute Rehabilitation [phone] (pager)

## 2015-03-25 DIAGNOSIS — D696 Thrombocytopenia, unspecified: Secondary | ICD-10-CM

## 2015-03-25 DIAGNOSIS — N179 Acute kidney failure, unspecified: Principal | ICD-10-CM

## 2015-03-25 LAB — RENAL FUNCTION PANEL
ANION GAP: 8 (ref 5–15)
Albumin: 3 g/dL — ABNORMAL LOW (ref 3.5–5.0)
BUN: 76 mg/dL — AB (ref 6–20)
CHLORIDE: 105 mmol/L (ref 101–111)
CO2: 25 mmol/L (ref 22–32)
Calcium: 8.7 mg/dL — ABNORMAL LOW (ref 8.9–10.3)
Creatinine, Ser: 3.06 mg/dL — ABNORMAL HIGH (ref 0.61–1.24)
GFR calc Af Amer: 21 mL/min — ABNORMAL LOW (ref >60)
GFR, EST NON AFRICAN AMERICAN: 18 mL/min — AB (ref >60)
Glucose, Bld: 112 mg/dL — ABNORMAL HIGH (ref 65–99)
PHOSPHORUS: 4.1 mg/dL (ref 2.5–4.6)
POTASSIUM: 4.7 mmol/L (ref 3.5–5.1)
Sodium: 138 mmol/L (ref 135–145)

## 2015-03-25 LAB — GLUCOSE, CAPILLARY
GLUCOSE-CAPILLARY: 172 mg/dL — AB (ref 65–99)
GLUCOSE-CAPILLARY: 153 mg/dL — AB (ref 65–99)
Glucose-Capillary: 140 mg/dL — ABNORMAL HIGH (ref 65–99)
Glucose-Capillary: 173 mg/dL — ABNORMAL HIGH (ref 65–99)

## 2015-03-25 LAB — CBC
HEMATOCRIT: 25.4 % — AB (ref 39.0–52.0)
HEMOGLOBIN: 7.8 g/dL — AB (ref 13.0–17.0)
MCH: 27 pg (ref 26.0–34.0)
MCHC: 30.7 g/dL (ref 30.0–36.0)
MCV: 87.9 fL (ref 78.0–100.0)
PLATELETS: 81 10*3/uL — AB (ref 150–400)
RBC: 2.89 MIL/uL — AB (ref 4.22–5.81)
RDW: 18.2 % — AB (ref 11.5–15.5)
WBC: 4.5 10*3/uL (ref 4.0–10.5)

## 2015-03-25 LAB — PARATHYROID HORMONE, INTACT (NO CA): PTH: 26 pg/mL (ref 15–65)

## 2015-03-25 MED ORDER — TORSEMIDE 20 MG PO TABS
100.0000 mg | ORAL_TABLET | Freq: Two times a day (BID) | ORAL | Status: DC
  Administered 2015-03-25: 100 mg via ORAL
  Filled 2015-03-25: qty 5

## 2015-03-25 MED ORDER — LABETALOL HCL 100 MG PO TABS
100.0000 mg | ORAL_TABLET | Freq: Two times a day (BID) | ORAL | Status: DC
  Administered 2015-03-26: 100 mg via ORAL
  Filled 2015-03-25 (×2): qty 1

## 2015-03-25 MED ORDER — SODIUM CHLORIDE 0.9 % IV SOLN
510.0000 mg | INTRAVENOUS | Status: DC
  Administered 2015-03-25: 510 mg via INTRAVENOUS
  Filled 2015-03-25: qty 17

## 2015-03-25 MED ORDER — SODIUM CHLORIDE 0.9 % IV SOLN: 510.0000 mg | INTRAVENOUS | Status: DC

## 2015-03-25 MED ORDER — SODIUM CHLORIDE 0.9 % IV SOLN
510.0000 mg | INTRAVENOUS | Status: DC
  Filled 2015-03-25: qty 17

## 2015-03-25 MED ORDER — TORSEMIDE 20 MG PO TABS: 100.0000 mg | ORAL_TABLET | Freq: Every day | ORAL | Status: DC

## 2015-03-25 MED ORDER — DARBEPOETIN ALFA 200 MCG/0.4ML IJ SOSY
200.0000 ug | PREFILLED_SYRINGE | Freq: Once | INTRAMUSCULAR | Status: AC
  Administered 2015-03-25: 200 ug via SUBCUTANEOUS
  Filled 2015-03-25 (×2): qty 0.4

## 2015-03-25 NOTE — Progress Notes (Signed)
Pt places self on/off home unit cpap.  Rt will monitor.

## 2015-03-25 NOTE — Progress Notes (Signed)
[facility] PROGRESS NOTE  [name] HXT:[id number] DOB: [birth year] DOA: 03/22/2015 PCP: Wende Neighbors, MD  Assessment/Plan: 1. Symptomatic anemia -[name] having a history of normocytic anemia thought to be secondary to chronic kidney disease, and had been undergoing infusion with IV iron in the outpatient setting. -He reports worsening generalized weakness associate with shortness of breath having a fall yesterday at home. -Labs showing hemoglobin of 6.9. Prior to this labs indicate that he had a hemoglobin of 8.3 on 02/26/2015. -He denies bright red blood per rectum or hematemesis. -Previous workup included electrophoresis which did not reveal multiple myeloma. -On 03/24/2015 after transfusion with 2 units of packed red blood cells a.m. labs revealing hemoglobin of 7.8 with hematocrit of 25.6, from hemoglobin of 6.9. From a symptomatic standpoint he reports feeling a little better. -History was working guaiac positive. Spoke to family members about this it appears he has a history of guaiac-positive stools for which he recently underwent a colonoscopy in September 2016 in [place]. Patient and family members reporting that "everything came out good" from that study. Medical records unavailable at the time of this dictation. -Lab showing stable hemoglobin of 7.8 on 03/25/2015.  2.  Acute on chronic renal failure -Lab work showing creatinine of 3.22 with BUN is 72. Lab work from 02/26/2015 revealed a creatinine of 2.3 with BUN of 48.4. He is being administered volume through transfusion of packed red blood cells -Despite administration of volume from blood transfusion his creatinine remains elevated at 2.29. -His urinalysis was unremarkable. -Case was discussed with Dr. Jimmy Footman of nephrology. Patient currently follows Dr. Justin Mend at the Nephrology clinic.  -On 03/25/2015 creatinine trended down to 3.06 from 3.29 on 03/24/2015. Spoke with nephrology felt renal failure related to congestive  heart failure. His torsemide dose is being increased to 100 mg by mouth twice a day with continuation of spironolactone at 25 mg daily.  3.  Status post fall -Patient reporting a fall at home which attributes to generalized weakness. -Will reassess after transfusion with 2 units packed red blood cells -Physical therapy consultation placed who recommended rehabilitation at skilled nursing facility. Patient insistent on going home after this hospitalization. -Spoke with family members who agree with rehabilitation at skilled nursing facility. Today he seems open to the idea of rehabilitation. -Will consult social work.  4.  Chronic combined systolic and diastolic congestive heart failure -Last transthoracic echocardiogram was performed on 08/18/2014 which showed ejection fraction 45-50% with grade 2 diastolic dysfunction -Will monitor closely as he has been transfused with 2 units packed red blood cells today, currently does not have clinical evidence of acute decompensated congestive heart failure -On 03/25/2015 his torsemide was increased to 100 mg by mouth twice a day with a continuation of Lasix at 25 mg daily. -Spoke with nephrology felt congestion likely contributed to worsening kidney function.  5.  Hypertension -His blood pressures on the low normal side on 03/23/2015 for which Norvasc was discontinued. -Blood pressures seem improved on 03/24/2015. Will continue holding Norvasc  Code Status: Full code Family Communication: I spoke with family members were present at bedside Disposition Plan: Despite receiving blood transfusion creatinine remains elevated, nephrology consulted. 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 He reports having increasing generalized weakness associate with shortness of breath. He reported Halina fall at home while going to the bathroom stating his legs gave out. Denied loss of consciousness. Workup in the emergency department which included a CT scan of brain, MRI of C-spine, x-ray of hip were unremarkable. No evidence of fracture. Labs did reveal hemoglobin of 6.9, down from 8.3 on 02/26/2015. Labs also revealed the development of acute kidney injury having creatinine of 3.22, increased from 2.3 on 02/26/2015. Symptoms felt to be secondary to anemia as he was typed and crossed and transfused with 2 units of packed red blood cells.  Objective: Filed Vitals:   03/25/15 0620 03/25/15 0851  BP: 116/40 129/61  Pulse: 58 63  Temp: 97.6 F (36.4 C) 97.6 F (36.4 C)  Resp: 20 19    Intake/Output Summary (Last 24 hours) at 03/25/15 1511 Last data filed at 03/25/15 1444  Gross per 24 hour  Intake    720 ml  Output   1750 ml  Net  -1030 ml   Filed Weights   03/23/15 0512 03/23/15 2036 03/24/15 2108  Weight: 177.7 kg (391 lb 12.1 oz) 179.1 kg (394 lb 13.5 oz) 180.7 kg (398 lb 5.9 oz)    Exam:   General:  Patient is awake and alert, no acute distress, states feeling better after blood transfusion  Cardiovascular: 2/6 systolic ejection murmur, normal S1-S2  Respiratory: Diminished breath sounds bilaterally, suspect related to body habitus, no crackles or rhonchi  Abdomen: Obese, soft nontender nondistended  Musculoskeletal: Venous stasis changes involving bilateral extremities  Data Reviewed: Basic Metabolic Panel:  Recent Labs Lab 03/23/15 0118 03/23/15 0644 03/24/15 0520 03/25/15 0357  NA 140 140 137 138  K 4.4 4.3 4.3 4.7  CL 107 106 105 105  CO2 $Re'22 24 23 25  'DcV$ GLUCOSE 107* 95 119* 112*  BUN 72* 73* 76* 76*  CREATININE 3.22* 3.27* 3.29* 3.06*  CALCIUM  8.8* 8.7* 8.6* 8.7*  PHOS  --   --   --  4.1   Liver Function Tests:  Recent Labs Lab 03/23/15 0118 03/23/15 0644 03/25/15 0357  AST 36 35  --   ALT 23 23  --   ALKPHOS 58 59  --   BILITOT 1.4* 1.1  --   PROT 5.7* 5.5*  --   ALBUMIN 3.3* 3.2* 3.0*   No results for input(s): LIPASE, AMYLASE in the last 168 hours. No results for input(s): AMMONIA in the last 168 hours. CBC:  Recent Labs Lab 03/23/15 0118 03/23/15 0644 03/23/15 2305 03/24/15 0520 03/24/15 1835 03/25/15 0357  WBC 5.7 4.7  --  5.4 4.6 4.5  NEUTROABS 3.8 2.9  --   --   --   --   HGB 7.0* 6.9* 7.9* 7.8* 8.0* 7.8*  HCT 23.9* 22.8* 25.7* 25.6* 25.6* 25.4*  MCV 87.9 87.7  --  87.1 87.1 87.9  PLT 99* 95*  --  96* 92* 81*   Cardiac Enzymes:  Recent Labs Lab 03/24/15 1835  CKTOTAL 319   BNP (last 3 results)  Recent Labs  08/16/14 1945  BNP 869.7*    ProBNP (last 3 results) No results for input(s): PROBNP in the last 8760 hours.  CBG:  Recent Labs Lab 03/24/15 1126 03/24/15 1630 03/24/15 2106 03/25/15 0841 03/25/15 1133  GLUCAP 135* 172* 129* 172* 153*    No results found for this or any previous visit (from the past 240 hour(s)).   Studies:  US Renal  03/24/2015  CLINICAL DATA:  Acute renal insufficiency EXAM: RENAL / URINARY TRACT ULTRASOUND COMPLETE COMPARISON:  None. FINDINGS: Right Kidney: Length: 14 cm in length. There is cortical thinning probable due to atrophy. No hydronephrosis. No renal calculus. Left Kidney: Not identified due to patient's large body habitus. Bladder: There is a Foley catheter within decompressed urinary bladder. IMPRESSION: No right hydronephrosis. Cortical thinning probable due to atrophy. There left kidney is not identified. Foley catheter within decompressed urinary bladder. Electronically Signed   By: Lahoma Crocker M.D.   On: 03/24/2015 18:34    Scheduled Meds: . sodium chloride   Intravenous Once  . cholecalciferol  5,000 Units Oral Daily  . darbepoetin  (ARANESP) injection - NON-DIALYSIS  200 mcg Subcutaneous Once  . ferumoxytol  510 mg Intravenous Weekly  . fluticasone  2 spray Each Nare Daily  . insulin aspart  0-9 Units Subcutaneous TID WC  . insulin detemir  15 Units Subcutaneous QHS  . labetalol  100 mg Oral BID  . latanoprost  1 drop Right Eye QHS  . loratadine  10 mg Oral Daily  . multivitamin with minerals  1 tablet Oral Daily  . potassium chloride SA  40 mEq Oral Daily  . sertraline  25 mg Oral QHS  . sodium chloride  3 mL Intravenous Q12H  . spironolactone  25 mg Oral Daily  . tamsulosin  0.4 mg Oral QHS  . torsemide  100 mg Oral BID  . vitamin B-12  1,000 mcg Oral Daily   Continuous Infusions:   Principal Problem:   Acute on chronic renal insufficiency (HCC) Active Problems:   Essential hypertension, benign   CAD- mild non obstructive Oct 2006   Chronic diastolic heart failure (HCC)   Blindness of left eye   Chronic anemia   Guaiac positive stools   Thrombocytopenia (HCC)   Renal failure (ARF), acute on chronic ([place])    Time spent: 25 min    Kelvin Cellar  [facility] Pager [phone]. If 7PM-7AM, please contact night-coverage at [email or web address], password [facility] 03/25/2015, 3:11 PM  LOS: 2 days

## 2015-03-25 NOTE — Progress Notes (Signed)
Subjective: Interval History: has no complaint.  Objective: Vital signs in last 24 hours: Temp:  [97.6 F (36.4 C)-98.6 F (37 C)] 97.6 F (36.4 C) (11/27 0851) Pulse Rate:  [58-80] 63 (11/27 0851) Resp:  [19-20] 19 (11/27 0851) BP: (112-129)/(40-73) 129/61 mmHg (11/27 0851) SpO2:  [93 %-100 %] 93 % (11/27 0851) Weight:  [180.7 kg (398 lb 5.9 oz)] 180.7 kg (398 lb 5.9 oz) (11/26 2108) Weight change: 1.6 kg (3 lb 8.4 oz)  Intake/Output from previous day: 11/26 0701 - 11/27 0700 In: 440 [P.O.:440] Out: 1401 [Urine:1400; Stool:1] Intake/Output this shift: Total I/O In: 360 [P.O.:360] Out: 500 [Urine:500]  General appearance: alert, cooperative, mildly obese and pale Resp: diminished breath sounds bilaterally and rales bibasilar Cardio: S1, S2 normal and systolic murmur: holosystolic 2/6, blowing at apex GI: obese,pos bs,liver down 8 cm Extremities: edema 3+  Lab Results:  Recent Labs  03/24/15 1835 03/25/15 0357  WBC 4.6 4.5  HGB 8.0* 7.8*  HCT 25.6* 25.4*  PLT 92* 81*   BMET:  Recent Labs  03/24/15 0520 03/25/15 0357  NA 137 138  K 4.3 4.7  CL 105 105  CO2 23 25  GLUCOSE 119* 112*  BUN 76* 76*  CREATININE 3.29* 3.06*  CALCIUM 8.6* 8.7*   No results for input(s): PTH in the last 72 hours. Iron Studies:  Recent Labs  03/24/15 1835  IRON 20*  TIBC 360    Studies/Results: Mr Cervical Spine Wo Contrast  03/23/2015  CLINICAL DATA:  Status post trip and fall 03/22/2015. Increasing diffuse weakness. Question ligamentous injury at C3-4. Initial encounter. EXAM: MRI CERVICAL SPINE WITHOUT CONTRAST TECHNIQUE: Multiplanar, multisequence MR imaging of the cervical spine was performed. No intravenous contrast was administered. COMPARISON:  CT cervical spine 03/22/2015. FINDINGS: The study is degraded by patient motion. Vertebral body height and signal are maintained. Trace anterolisthesis C3 on C4 is due to facet arthropathy. No evidence of ligamentous injury is  seen. The craniocervical junction is normal and cervical cord signal is normal. Imaged paraspinous structures are unremarkable. Mucosal thickening is noted in the right maxillary sinus. C2-3: Facet degenerative disease is seen. The central canal and foramina appear open. C3-4: Facet arthropathy is seen. The disc is uncovered without bulging. The central canal is open. There is likely moderate bilateral foraminal narrowing although evaluation is degraded by motion. C4-5: Mild posterior bony ridging and uncovertebral disease. The central canal is open. There appears to be bilateral foraminal narrowing. C5-6: Small disc osteophyte complex, facet arthropathy and uncovertebral disease are seen. The ventral thecal sac is nearly effaced. The foramina are narrowed. C6-7: Small disc osteophyte complex effaces the ventral thecal sac. Uncovertebral disease appears worse on the left there is left worse than right foraminal narrowing. C7-T1: Minimal disc bulge without central canal or foraminal stenosis. IMPRESSION: Trace anterolisthesis C3 on C4 seen on comparison CT scan is due to facet degenerative disease. No evidence of posttraumatic change is present on this examination. Mild multilevel disc bulging as described above most notable at C5-6 and C6-7 where the ventral thecal sac is effaced. There appears to be multilevel foraminal narrowing. Evaluation is limited by patient motion. Electronically Signed   By: Inge Rise M.D.   On: 03/23/2015 13:07   US Renal  03/24/2015  CLINICAL DATA:  Acute renal insufficiency EXAM: RENAL / URINARY TRACT ULTRASOUND COMPLETE COMPARISON:  None. FINDINGS: Right Kidney: Length: 14 cm in length. There is cortical thinning probable due to atrophy. No hydronephrosis. No renal calculus. Left Kidney:  Not identified due to patient's large body habitus. Bladder: There is a Foley catheter within decompressed urinary bladder. IMPRESSION: No right hydronephrosis. Cortical thinning probable due  to atrophy. There left kidney is not identified. Foley catheter within decompressed urinary bladder. Electronically Signed   By: Lahoma Crocker M.D.   On: 03/24/2015 18:34    I have reviewed the patient's current medications.  Assessment/Plan: 1  CKD4 improving.  FENA c/w low perfusion with CHF.  Lower antiHTN.  Vol xs will push diruretics.U/S c/w chronic renal dz.  No obstruction.  2 anemia Fe low bolus and give esa 3 CHF primary issue 4 massiv obesity  5 DM controlled 6 HPTH pending P ^torsemide, cont other diuretics, add esa/Fe   LOS: 2 days   [name] L 03/25/2015,11:56 AM

## 2015-03-26 DIAGNOSIS — R4181 Age-related cognitive decline: Secondary | ICD-10-CM | POA: Diagnosis not present

## 2015-03-26 DIAGNOSIS — G4739 Other sleep apnea: Secondary | ICD-10-CM | POA: Diagnosis not present

## 2015-03-26 DIAGNOSIS — N183 Chronic kidney disease, stage 3 (moderate): Secondary | ICD-10-CM | POA: Diagnosis not present

## 2015-03-26 DIAGNOSIS — N179 Acute kidney failure, unspecified: Secondary | ICD-10-CM | POA: Insufficient documentation

## 2015-03-26 DIAGNOSIS — N390 Urinary tract infection, site not specified: Secondary | ICD-10-CM | POA: Diagnosis not present

## 2015-03-26 DIAGNOSIS — E119 Type 2 diabetes mellitus without complications: Secondary | ICD-10-CM | POA: Diagnosis not present

## 2015-03-26 LAB — CBC
HEMATOCRIT: 26.7 % — AB (ref 39.0–52.0)
Hemoglobin: 8.1 g/dL — ABNORMAL LOW (ref 13.0–17.0)
MCH: 26.9 pg (ref 26.0–34.0)
MCHC: 30.3 g/dL (ref 30.0–36.0)
MCV: 88.7 fL (ref 78.0–100.0)
PLATELETS: 84 10*3/uL — AB (ref 150–400)
RBC: 3.01 MIL/uL — ABNORMAL LOW (ref 4.22–5.81)
RDW: 18.1 % — AB (ref 11.5–15.5)
WBC: 4.5 10*3/uL (ref 4.0–10.5)

## 2015-03-26 LAB — COMPREHENSIVE METABOLIC PANEL
ALBUMIN: 3.2 g/dL — AB (ref 3.5–5.0)
ALT: 25 U/L (ref 17–63)
ANION GAP: 9 (ref 5–15)
AST: 33 U/L (ref 15–41)
Alkaline Phosphatase: 58 U/L (ref 38–126)
BUN: 68 mg/dL — ABNORMAL HIGH (ref 6–20)
CHLORIDE: 106 mmol/L (ref 101–111)
CO2: 26 mmol/L (ref 22–32)
Calcium: 8.8 mg/dL — ABNORMAL LOW (ref 8.9–10.3)
Creatinine, Ser: 2.84 mg/dL — ABNORMAL HIGH (ref 0.61–1.24)
GFR calc Af Amer: 22 mL/min — ABNORMAL LOW (ref >60)
GFR calc non Af Amer: 19 mL/min — ABNORMAL LOW (ref >60)
GLUCOSE: 117 mg/dL — AB (ref 65–99)
POTASSIUM: 3.8 mmol/L (ref 3.5–5.1)
SODIUM: 141 mmol/L (ref 135–145)
TOTAL PROTEIN: 5.7 g/dL — AB (ref 6.5–8.1)
Total Bilirubin: 1.1 mg/dL (ref 0.3–1.2)

## 2015-03-26 LAB — PHOSPHORUS: Phosphorus: 4 mg/dL (ref 2.5–4.6)

## 2015-03-26 LAB — GLUCOSE, CAPILLARY
GLUCOSE-CAPILLARY: 143 mg/dL — AB (ref 65–99)
GLUCOSE-CAPILLARY: 141 mg/dL — AB (ref 65–99)
Glucose-Capillary: 106 mg/dL — ABNORMAL HIGH (ref 65–99)

## 2015-03-26 MED ORDER — HYDROCODONE-ACETAMINOPHEN 5-325 MG PO TABS: 1.0000 | ORAL_TABLET | Freq: Four times a day (QID) | ORAL | Status: DC | PRN

## 2015-03-26 MED ORDER — FERUMOXYTOL INJECTION 510 MG/17 ML: 510.0000 mg | INTRAVENOUS | Status: DC

## 2015-03-26 NOTE — Discharge Summary (Signed)
Physician Discharge Summary  [name] ACZ:[id number] DOB: July 21, 1931 DOA: 03/22/2015  PCP: Wende Neighbors, MD  Admit date: 03/22/2015 Discharge date: 03/26/2015  Time spent: 35 minutes  Recommendations for Outpatient Follow-up:  1. Please follow up on BMP in 3-4 days, report these results to his Nephrologist Dr Justin Mend at [facility]. He was treated for acute on chronic renal failure during this hospitalization, having a creatinine of 2.84 with BUN of 68 on day of discharge. 2. Please follow-up on a CBC in 4-5 days, patient with history of anemia secondary to chronic kidney disease, was transfused with 2 units of packed red blood cells during this hospitalization. On day of discharge he had a hemoglobin of 8.1 with hematocrit of 26.7. 3. Follow-up on volume status, on day of discharge he had a weight of 179.1 kg He was discharged on Torsemide 100 mg PO BID and Spironolactone 25 mg Po q daily and will need close follow up.   Discharge Diagnoses:  Principal Problem:  Acute on chronic renal insufficiency (HCC) Active Problems:  Essential hypertension, benign  CAD- mild non obstructive Oct 1093  Chronic diastolic heart failure (HCC)  Blindness of left eye  Chronic anemia  Guaiac positive stools  Thrombocytopenia (HCC)  Renal failure (ARF), acute on chronic [facility])   Discharge Condition: Stable  Diet recommendation: Low Salt Heart Healthy Diet  Filed Weights   03/23/15 2036 03/24/15 2108 03/25/15 2118  Weight: 179.1 kg (394 lb 13.5 oz) 180.7 kg (398 lb 5.9 oz) 179.1 kg (394 lb 13.5 oz)    History of present illness:  [name] is a 81 y.o. male with history of chronic kidney disease, anemia and thrombocytopenia scheduled to have IV iron therapy today, diabetes mellitus type 2, CHF and OSA was brought to the ER after patient had a fall at his house. Over the last 4 weeks patient has become increasingly bedbound because of difficulty walking due to  shortness of breath. Patient had a fall while trying to go to the bathroom and he slipped on the doorknob and denies losing his consciousness. Denies any chest pain or shortness of breath. In the ER CT head did not show any acute and CT C-spine was showing anterior subluxation at C3-C4 which MRI C-spine is pending. Patient denies any neck pain. Patient has no focal deficits. Patient's labs show worsening renal function with worsening anemia. Patient has recently been to oncologist and was found to have iron deficiency anemia and the anemia due to renal insufficiency. Patient also had a colonoscopy 2 weeks ago at [facility] as per the son and it was unremarkable as per the patient's son and patient. Stool for blood has been positive with stool being brown in color. Patient's weight has been around 390 pounds on admission and is usually around 370 baseline as per the cardiology notes.  Hospital Course:  [name] is a pleasant 81 year old gentleman with multiple comorbidities including stage 3/4 chronic kidney disease, anemia of chronic kidney disease, congestive heart failure, obstructive sleep apnea, morbid obesity, admitted to medicine service on 03/23/2015 after having a fall at home. He reports having increasing generalized weakness associate with shortness of breath. He reported [name] fall at home while going to the bathroom stating his legs gave out. Denied loss of consciousness. Workup in the emergency department which included a CT scan of brain, MRI of C-spine, x-ray of hip were unremarkable. No evidence of fracture. Labs did reveal hemoglobin of 6.9, down from 8.3 on 02/26/2015. Labs also  revealed the development of acute kidney injury having creatinine of 3.22, increased from 2.3 on 02/26/2015. Symptoms felt to be secondary to anemia as he was typed and crossed and transfused with 2 units of packed red blood cells.   Symptomatic anemia -[name] having a history of normocytic anemia thought to be  secondary to chronic kidney disease, and had been undergoing infusion with IV iron in the outpatient setting. -He reports worsening generalized weakness associate with shortness of breath having a fall yesterday at home. -Labs showing hemoglobin of 6.9. Prior to this labs indicate that he had a hemoglobin of 8.3 on 02/26/2015. -He denies bright red blood per rectum or hematemesis. -Previous workup included electrophoresis which did not reveal multiple myeloma. -On 03/24/2015 after transfusion with 2 units of packed red blood cells a.m. labs revealing hemoglobin of 7.8 with hematocrit of 25.6, from hemoglobin of 6.9. From a symptomatic standpoint he reports feeling a little better. -History was working guaiac positive. Spoke to family members about this it appears he has a history of guaiac-positive stools for which he recently underwent a colonoscopy in September 2016 in [place]. Patient and family members reporting that "everything came out good" from that study. Medical records unavailable at the time of this dictation. -Lab showing stable hemoglobin of 8.2 on 03/26/2015. ASA was discontinued during this hospitalization. Please reassess ASA therapy on hospital follow up visit.   2. Acute on chronic renal failure -Lab work showing creatinine of 3.22 with BUN is 72. Lab work from 02/26/2015 revealed a creatinine of 2.3 with BUN of 48.4. He is being administered volume through transfusion of packed red blood cells -Despite administration of volume from blood transfusion his creatinine remains elevated at 2.29. -His urinalysis was unremarkable. -Case was discussed with Dr. Jimmy Footman of nephrology. Patient currently follows Dr. Justin Mend at the Nephrology clinic.  -On 03/25/2015 creatinine trended down to 3.06 from 3.29 on 03/24/2015. Spoke with nephrology felt renal failure related to congestive heart failure. His torsemide dose is being increased to 100 mg by mouth twice a day with continuation of  spironolactone at 25 mg daily.  3. Status post fall -Patient reporting a fall at home which attributes to generalized weakness. -Will reassess after transfusion with 2 units packed red blood cells -Physical therapy consultation placed who recommended rehabilitation at skilled nursing facility. Patient insistent on going home after this hospitalization. -Spoke with family members who agree with rehabilitation at skilled nursing facility. Today he seems open to the idea of rehabilitation. -Patient discharged to SNF for acute rehab.   4. Chronic combined systolic and diastolic congestive heart failure -Last transthoracic echocardiogram was performed on 08/18/2014 which showed ejection fraction 45-50% with grade 2 diastolic dysfunction -Will monitor closely as he has been transfused with 2 units packed red blood cells today, currently does not have clinical evidence of acute decompensated congestive heart failure -On 03/25/2015 his torsemide was increased to 100 mg by mouth twice a day with a continuation of Lasix at 25 mg daily. -Spoke with nephrology felt congestion likely contributed to worsening kidney function.  -He was discharged on Torsemide 100 mg PO BID and spironolactone 25 mg P[o q daily, will need close outpatient follow up. Please check a BMP in 3-4 days and fax results to Dr Justin Mend of Nephrology.   5. Hypertension -His blood pressures on the low normal side on 03/23/2015 for which Norvasc was discontinued. -Blood pressures stable on discharge.    Consultations:  Nephrology  Discharge Exam: Filed Vitals:  03/26/15 0530 03/26/15 1100  BP: 119/29 125/50  Pulse: 68 74  Temp: 98.2 F (36.8 C) 98.4 F (36.9 C)  Resp: 18 18     General: Patient is awake and alert, no acute distress, states doing well.   Cardiovascular: 2/6 systolic ejection murmur, normal S1-S2  Respiratory: Diminished breath sounds bilaterally, suspect related to body habitus, no  crackles or rhonchi  Abdomen: Obese, soft nontender nondistended  Musculoskeletal: Venous stasis changes involving bilateral extremities, has 2+ bilateral pitting edema.  Discharge Instructions   Discharge Instructions    Call MD for: difficulty breathing, headache or visual disturbances  Complete by: As directed      Call MD for: extreme fatigue  Complete by: As directed      Call MD for: hives  Complete by: As directed      Call MD for: persistant dizziness or light-headedness   Complete by: As directed      Call MD for: persistant nausea and vomiting  Complete by: As directed      Call MD for: redness, tenderness, or signs of infection (pain, swelling, redness, odor or green/yellow discharge around incision site)  Complete by: As directed      Call MD for: severe uncontrolled pain  Complete by: As directed      Call MD for: temperature >100.4  Complete by: As directed      Call MD for:  Complete by: As directed      Diet - low sodium heart healthy  Complete by: As directed      Increase activity slowly  Complete by: As directed           Current Discharge Medication List    CONTINUE these medications which have CHANGED   Details  HYDROcodone-acetaminophen (NORCO/VICODIN) 5-325 MG tablet Take 1-2 tablets by mouth every 6 (six) hours as needed for moderate pain. Qty: 20 tablet, Refills: 0      CONTINUE these medications which have NOT CHANGED   Details  benzoyl peroxide 10 % gel Apply 1 application topically 3 (three) times daily.    calcium-vitamin D (OSCAL WITH D) 500-200 MG-UNIT per tablet Take 1 tablet by mouth 2 (two) times daily.    cetirizine (ZYRTEC) 10 MG tablet Take 10 mg by mouth every evening.     Cholecalciferol (D-3-5) 5000 UNITS capsule Take 5,000 Units by mouth daily.     fluticasone (FLONASE) 50 MCG/ACT nasal spray Place 2  sprays into the nose daily.     insulin aspart (NOVOLOG) 100 UNIT/ML injection Inject 0-12 Units into the skin 3 (three) times daily before meals. Sliding scale based on sugar level 101-150: 3 units 151-200: 4 units 201-250: 8 units 251-300: 12 units    insulin detemir (LEVEMIR) 100 UNIT/ML injection Inject 0.15 mLs (15 Units total) into the skin at bedtime. Qty: 10 mL, Refills: 12    labetalol (NORMODYNE) 200 MG tablet Take 1 tablet (200 mg total) by mouth 2 (two) times daily. Qty: 60 tablet, Refills: 1    latanoprost (XALATAN) 0.005 % ophthalmic solution Place 1 drop into the right eye at bedtime.     lidocaine (LIDODERM) 5 % Place 1 patch onto the skin daily as needed (for pain). Remove & Discard patch within 12 hours or as directed by MD.    metolazone (ZAROXOLYN) 2.5 MG tablet TAKE 1 TABLET BY MOUTH TWICE PER WEEK SUNDAYS AND THURSDAYS. Qty: 8 tablet, Refills: 2    Multiple Vitamin (MULTIVITAMIN) tablet Take 1 tablet by mouth  daily.    potassium chloride SA (K-DUR,KLOR-CON) 20 MEQ tablet Take 2 tablets (40 mEq total) by mouth 2 (two) times daily. Qty: 120 tablet, Refills: 6    sertraline (ZOLOFT) 25 MG tablet Take 25 mg by mouth at bedtime.    sodium chloride (OCEAN) 0.65 % SOLN nasal spray Place 1 spray into both nostrils as needed for congestion. Refills: 0    spironolactone (ALDACTONE) 25 MG tablet Take 1 tablet (25 mg total) by mouth daily.    Tamsulosin HCl (FLOMAX) 0.4 MG CAPS Take 0.4 mg by mouth at bedtime.     vitamin B-12 1000 MCG tablet Take 1 tablet (1,000 mcg total) by mouth daily. Qty: 30 tablet, Refills: 0      STOP taking these medications     amLODipine (NORVASC) 10 MG tablet      aspirin 81 MG chewable tablet      torsemide (DEMADEX) 20 MG tablet        Allergies  Allergen Reactions  . Lyrica [Pregabalin] Swelling    Causes him to have fluid buildup   . Sulfa Antibiotics  Other (See Comments)    Night sweats  . Sulfonamide Derivatives Other (See Comments)    Night sweats  . Ambien [Zolpidem Tartrate] Other (See Comments)    Makes him cuss like a sailor  . Penicillins Hives and Rash  . Piroxicam Hives  . Sulfamethoxazole Rash  . Zolpidem Other (See Comments)    Makes him cuss like a sailor   Follow-up Information    Please follow up.   Why: See your Physicains as scheduled for recheck. Continue pain medication      Follow up with Wende Neighbors, MD In 1 week.   Specialty: Internal Medicine   Contact information:   [place] [address] [phone]       Follow up with Sherril Croon, MD In 1 week.   Specialty: Nephrology   Contact information:   [place] [id number] [phone]         The results of significant diagnostics from this hospitalization (including imaging, microbiology, ancillary and laboratory) are listed below for reference.    Significant Diagnostic Studies:  Imaging Results    Dg Pelvis 1-2 Views  03/23/2015 CLINICAL DATA: Fall. Right hip pain. EXAM: PELVIS - 1-2 VIEW COMPARISON: None. FINDINGS: There is no evidence of pelvic fracture or diastasis. No pelvic bone lesions are seen. Minimal hypertrophic changes present at the superior acetabular margins in both hip joints. IMPRESSION: No pelvic fracture. Minimal osteoarthritis in the weight-bearing portions of both hip joints. Electronically Signed By: Ilona Sorrel M.D. On: 03/23/2015 08:17   Ct Head Wo Contrast  03/22/2015 CLINICAL DATA: Trip and fall injury, subsequent patient fell from stretcher onto the left side. Now with head and neck pain. Examination is limited as the patient had difficulty fitting into the gantry and was short of breath while laying flat. EXAM: CT HEAD WITHOUT CONTRAST CT CERVICAL SPINE WITHOUT CONTRAST TECHNIQUE: Multidetector CT imaging of the head and  cervical spine was performed following the standard protocol without intravenous contrast. Multiplanar CT image reconstructions of the cervical spine were also generated. COMPARISON: CT head 12/24/2014 FINDINGS: CT HEAD FINDINGS Diffuse cerebral atrophy. Mild ventricular dilatation consistent with central atrophy. Low-attenuation changes in the deep white matter consistent with small vessel ischemia. Basal ganglia calcifications. No mass effect or midline shift. No abnormal extra-axial fluid collections. Gray-white matter junctions are distinct. Basal cisterns are not effaced.  No evidence of acute intracranial hemorrhage. No depressed skull fractures. Opacification of left maxillary antrum and some of the bilateral ethmoid air cells. Opacification of some of the mastoid air cells bilaterally. Calcification and deformity of the left globe. Vascular calcifications. CT CERVICAL SPINE FINDINGS Cervical spine examination is significantly limited due to motion artifact. There is mild anterior subluxation of C3 on C4. This is likely degenerative but ligamentous injury is not excluded. Diffuse degenerative change throughout the cervical spine with narrowed cervical interspaces and endplate hypertrophic changes. Degenerative changes throughout the facet joints. Bone encroachment upon neural foramina bilaterally at multiple levels. No prevertebral soft tissue swelling. No vertebral compression deformities. C1-2 articulation appears intact. IMPRESSION: No acute intracranial abnormalities. Chronic atrophy and small vessel ischemic changes. Opacification of some of the paranasal sinuses. Examination of cervical spine is significant limited due to motion artifact. There is no gross displaced fracture identified. However, there is anterior subluxation of C3 on C4. This is likely degenerative but ligamentous injury is not excluded. Diffuse degenerative changes throughout cervical spine. Electronically Signed By: Lucienne Capers  M.D. On: 03/22/2015 21:13   Ct Cervical Spine Wo Contrast  03/22/2015 CLINICAL DATA: Trip and fall injury, subsequent patient fell from stretcher onto the left side. Now with head and neck pain. Examination is limited as the patient had difficulty fitting into the gantry and was short of breath while laying flat. EXAM: CT HEAD WITHOUT CONTRAST CT CERVICAL SPINE WITHOUT CONTRAST TECHNIQUE: Multidetector CT imaging of the head and cervical spine was performed following the standard protocol without intravenous contrast. Multiplanar CT image reconstructions of the cervical spine were also generated. COMPARISON: CT head 12/24/2014 FINDINGS: CT HEAD FINDINGS Diffuse cerebral atrophy. Mild ventricular dilatation consistent with central atrophy. Low-attenuation changes in the deep white matter consistent with small vessel ischemia. Basal ganglia calcifications. No mass effect or midline shift. No abnormal extra-axial fluid collections. Gray-white matter junctions are distinct. Basal cisterns are not effaced. No evidence of acute intracranial hemorrhage. No depressed skull fractures. Opacification of left maxillary antrum and some of the bilateral ethmoid air cells. Opacification of some of the mastoid air cells bilaterally. Calcification and deformity of the left globe. Vascular calcifications. CT CERVICAL SPINE FINDINGS Cervical spine examination is significantly limited due to motion artifact. There is mild anterior subluxation of C3 on C4. This is likely degenerative but ligamentous injury is not excluded. Diffuse degenerative change throughout the cervical spine with narrowed cervical interspaces and endplate hypertrophic changes. Degenerative changes throughout the facet joints. Bone encroachment upon neural foramina bilaterally at multiple levels. No prevertebral soft tissue swelling. No vertebral compression deformities. C1-2 articulation appears intact. IMPRESSION: No acute intracranial abnormalities.  Chronic atrophy and small vessel ischemic changes. Opacification of some of the paranasal sinuses. Examination of cervical spine is significant limited due to motion artifact. There is no gross displaced fracture identified. However, there is anterior subluxation of C3 on C4. This is likely degenerative but ligamentous injury is not excluded. Diffuse degenerative changes throughout cervical spine. Electronically Signed By: Lucienne Capers M.D. On: 03/22/2015 21:13   [name] Cervical Spine Wo Contrast  03/23/2015 CLINICAL DATA: Status post trip and fall 03/22/2015. Increasing diffuse weakness. Question ligamentous injury at C3-4. Initial encounter. EXAM: MRI CERVICAL SPINE WITHOUT CONTRAST TECHNIQUE: Multiplanar, multisequence [name] imaging of the cervical spine was performed. No intravenous contrast was administered. COMPARISON: CT cervical spine 03/22/2015. FINDINGS: The study is degraded by patient motion. Vertebral body height and signal are maintained. Trace anterolisthesis C3 on C4 is due to facet  arthropathy. No evidence of ligamentous injury is seen. The craniocervical junction is normal and cervical cord signal is normal. Imaged paraspinous structures are unremarkable. Mucosal thickening is noted in the right maxillary sinus. C2-3: Facet degenerative disease is seen. The central canal and foramina appear open. C3-4: Facet arthropathy is seen. The disc is uncovered without bulging. The central canal is open. There is likely moderate bilateral foraminal narrowing although evaluation is degraded by motion. C4-5: Mild posterior bony ridging and uncovertebral disease. The central canal is open. There appears to be bilateral foraminal narrowing. C5-6: Small disc osteophyte complex, facet arthropathy and uncovertebral disease are seen. The ventral thecal sac is nearly effaced. The foramina are narrowed. C6-7: Small disc osteophyte complex effaces the ventral thecal sac. Uncovertebral disease appears worse on  the left there is left worse than right foraminal narrowing. C7-T1: Minimal disc bulge without central canal or foraminal stenosis. IMPRESSION: Trace anterolisthesis C3 on C4 seen on comparison CT scan is due to facet degenerative disease. No evidence of posttraumatic change is present on this examination. Mild multilevel disc bulging as described above most notable at C5-6 and C6-7 where the ventral thecal sac is effaced. There appears to be multilevel foraminal narrowing. Evaluation is limited by patient motion. Electronically Signed By: Inge Rise M.D. On: 03/23/2015 13:07   US Renal  03/24/2015 CLINICAL DATA: Acute renal insufficiency EXAM: RENAL / URINARY TRACT ULTRASOUND COMPLETE COMPARISON: None. FINDINGS: Right Kidney: Length: 14 cm in length. There is cortical thinning probable due to atrophy. No hydronephrosis. No renal calculus. Left Kidney: Not identified due to patient's large body habitus. Bladder: There is a Foley catheter within decompressed urinary bladder. IMPRESSION: No right hydronephrosis. Cortical thinning probable due to atrophy. There left kidney is not identified. Foley catheter within decompressed urinary bladder. Electronically Signed By: Lahoma Crocker M.D. On: 03/24/2015 18:34   Dg Chest Port 1 View  03/23/2015 CLINICAL DATA: Shortness of breath, acute renal failure. EXAM: PORTABLE CHEST 1 VIEW COMPARISON: December 24, 2014. FINDINGS: Stable cardiomegaly is noted. No pneumothorax or significant pleural effusion is noted. Mild diffuse interstitial densities are noted throughout both lungs, right greater than left, concerning for mild pulmonary edema. Stable mild central pulmonary vascular congestion is noted. Bony thorax is unremarkable. IMPRESSION: Stable cardiomegaly and central pulmonary vascular congestion is noted, with mild diffuse interstitial densities throughout both lungs concerning for pulmonary edema. Electronically Signed By: Marijo Conception, M.D.  On: 03/23/2015 07:46   Dg Knee Complete 4 Views Right  03/22/2015 CLINICAL DATA: Initial evaluation for acute trauma, fall. EXAM: RIGHT KNEE - COMPLETE 4+ VIEW COMPARISON: None. FINDINGS: Diffuse osteopenia present, somewhat limiting evaluation for subtle nondisplaced fractures. No acute fracture or dislocation identified. No joint effusion. Severe tricompartmental degenerative osteoarthrosis. Prepatellar soft tissue swelling. IMPRESSION: 1. No acute fracture or dislocation. 2. Prepatellar soft tissue swelling. 3. Severe tricompartmental degenerative osteoarthrosis. 4. Diffuse osteopenia. Electronically Signed By: Jeannine Boga M.D. On: 03/22/2015 21:15   Dg Hand Complete Left  03/22/2015 CLINICAL DATA: Status post fall at home while walking. Left hand swelling. Initial encounter. EXAM: LEFT HAND - COMPLETE 3+ VIEW COMPARISON: None. FINDINGS: There is no evidence of fracture or dislocation. Joint space narrowing is noted at the first carpometacarpal joint, and there is mild diffuse osteopenia of visualized osseous structures. The carpal rows are intact, and demonstrate normal alignment. Diffuse soft tissue swelling is noted about the wrist. IMPRESSION: No evidence of fracture or dislocation. Electronically Signed By: Garald Balding M.D. On: 03/22/2015 21:17   Dg Hand Complete  Right  03/22/2015 CLINICAL DATA: Patient fell at home while walking. Swelling in both hands. EXAM: RIGHT HAND - COMPLETE 3+ VIEW COMPARISON: None. FINDINGS: Degenerative changes throughout the joints of the right hand and wrist. No evidence of acute fracture or subluxation. No focal bone lesion or bone destruction. Bone cortex and trabecular architecture appear intact. No radiopaque soft tissue foreign bodies. IMPRESSION: Prominent diffuse degenerative changes throughout the right hand and wrist. No acute fractures identified. Electronically Signed By: Lucienne Capers M.D. On: 03/22/2015 21:16      Microbiology: No results found for this or any previous visit (from the past 240 hour(s)).   Labs: Basic Metabolic Panel:  Last Labs      Recent Labs Lab 03/23/15 0118 03/23/15 0644 03/24/15 0520 03/25/15 0357 03/26/15 0503  NA 140 140 137 138 141  K 4.4 4.3 4.3 4.7 3.8  CL 107 106 105 105 106  CO2 _0 GLUCOSE 107* 95 119* 112* 117*  BUN 72* 73* 76* 76* 68*  CREATININE 3.22* 3.27* 3.29* 3.06* 2.84*  CALCIUM 8.8* 8.7* 8.6* 8.7* 8.8*  PHOS --  --  --  4.1 4.0     Liver Function Tests:  Last Labs      Recent Labs Lab 03/23/15 0118 03/23/15 0644 03/25/15 0357 03/26/15 0503  AST 36 35 --  33  ALT 23 23 --  25  ALKPHOS 58 59 --  58  BILITOT 1.4* 1.1 --  1.1  PROT 5.7* 5.5* --  5.7*  ALBUMIN 3.3* 3.2* 3.0* 3.2*      Last Labs     No results for input(s): LIPASE, AMYLASE in the last 168 hours.    Last Labs     No results for input(s): AMMONIA in the last 168 hours.   CBC:  Last Labs      Recent Labs Lab 03/23/15 0118 03/23/15 0644 03/23/15 2305 03/24/15 0520 03/24/15 1835 03/25/15 0357 03/26/15 0503  WBC 5.7 4.7 --  5.4 4.6 4.5 4.5  NEUTROABS 3.8 2.9 --  --  --  --  --   HGB 7.0* 6.9* 7.9* 7.8* 8.0* 7.8* 8.1*  HCT 23.9* 22.8* 25.7* 25.6* 25.6* 25.4* 26.7*  MCV 87.9 87.7 --  87.1 87.1 87.9 88.7  PLT 99* 95* --  96* 92* 81* 84*     Cardiac Enzymes:  Last Labs      Recent Labs Lab 03/24/15 1835  CKTOTAL 319     BNP: BNP (last 3 results)  Recent Labs (within last 365 days)     Recent Labs  08/16/14 1945  BNP 869.7*      ProBNP (last 3 results)  Recent Labs (within last 365 days)    No results for input(s): PROBNP in the last 8760 hours.    CBG:  Last Labs      Recent Labs Lab 03/25/15 1133 03/25/15 1618 03/25/15 2113 03/26/15 0758  03/26/15 1144  GLUCAP 153* 140* 173* 106* 143*         Signed:  Verdene Creson [facility] 03/26/2015, 3:16 PM

## 2015-03-26 NOTE — Progress Notes (Signed)
Report called to Grace Blight at [facility].

## 2015-03-26 NOTE — Progress Notes (Signed)
Physician Discharge Summary  [name] JZP:[id number] DOB: 1931-05-17 DOA: 03/22/2015  PCP: Wende Neighbors, MD  Admit date: 03/22/2015 Discharge date: 03/26/2015  Time spent: 35 minutes  Recommendations for Outpatient Follow-up:  1. Please follow up on BMP in 3-4 days, report these results to his Nephrologist Dr Justin Mend at [facility]. He was treated for acute on chronic renal failure during this hospitalization, having a creatinine of 2.84 with BUN of 68 on day of discharge. 2. Please follow-up on a CBC in 4-5 days, patient with history of anemia secondary to chronic kidney disease, was transfused with 2 units of packed red blood cells during this hospitalization. On day of discharge he had a hemoglobin of 8.1 with hematocrit of 26.7. 3. Follow-up on volume status, on day of discharge he had a weight of 179.1 kg He was discharged on Torsemide 100 mg PO BID and Spironolactone 25 mg Po q daily and will need close follow up.    Discharge Diagnoses:  Principal Problem:   Acute on chronic renal insufficiency (HCC) Active Problems:   Essential hypertension, benign   CAD- mild non obstructive Oct 2006   Chronic diastolic heart failure (HCC)   Blindness of left eye   Chronic anemia   Guaiac positive stools   Thrombocytopenia (HCC)   Renal failure (ARF), acute on chronic [facility])   Discharge Condition: Stable  Diet recommendation: Low Salt Heart Healthy Diet  Filed Weights   03/23/15 2036 03/24/15 2108 03/25/15 2118  Weight: 179.1 kg (394 lb 13.5 oz) 180.7 kg (398 lb 5.9 oz) 179.1 kg (394 lb 13.5 oz)    History of present illness:  [name] is a 81 y.o. male with history of chronic kidney disease, anemia and thrombocytopenia scheduled to have IV iron therapy today, diabetes mellitus type 2, CHF and OSA was brought to the ER after patient had a fall at his house. Over the last 4 weeks patient has become increasingly bedbound because of difficulty walking due to shortness of breath.  Patient had a fall while trying to go to the bathroom and he slipped on the doorknob and denies losing his consciousness. Denies any chest pain or shortness of breath. In the ER CT head did not show any acute and CT C-spine was showing anterior subluxation at C3-C4 which MRI C-spine is pending. Patient denies any neck pain. Patient has no focal deficits. Patient's labs show worsening renal function with worsening anemia. Patient has recently been to oncologist and was found to have iron deficiency anemia and the anemia due to renal insufficiency. Patient also had a colonoscopy 2 weeks ago at [facility] as per the son and it was unremarkable as per the patient's son and patient. Stool for blood has been positive with stool being brown in color. Patient's weight has been around 390 pounds on admission and is usually around 370 baseline as per the cardiology notes.  Hospital Course:  [name] is a pleasant 81 year old gentleman with multiple comorbidities including stage 3/4 chronic kidney disease, anemia of chronic kidney disease, congestive heart failure, obstructive sleep apnea, morbid obesity, admitted to medicine service on 03/23/2015 after having a fall at home. He reports having increasing generalized weakness associate with shortness of breath. He reported Halina fall at home while going to the bathroom stating his legs gave out. Denied loss of consciousness. Workup in the emergency department which included a CT scan of brain, MRI of C-spine, x-ray of hip were unremarkable. No evidence of fracture. Labs did reveal  hemoglobin of 6.9, down from 8.3 on 02/26/2015. Labs also revealed the development of acute kidney injury having creatinine of 3.22, increased from 2.3 on 02/26/2015. Symptoms felt to be secondary to anemia as he was typed and crossed and transfused with 2 units of packed red blood cells.   Symptomatic anemia -[name] having a history of normocytic anemia thought to be secondary to chronic  kidney disease, and had been undergoing infusion with IV iron in the outpatient setting. -He reports worsening generalized weakness associate with shortness of breath having a fall yesterday at home. -Labs showing hemoglobin of 6.9. Prior to this labs indicate that he had a hemoglobin of 8.3 on 02/26/2015. -He denies bright red blood per rectum or hematemesis. -Previous workup included electrophoresis which did not reveal multiple myeloma. -On 03/24/2015 after transfusion with 2 units of packed red blood cells a.m. labs revealing hemoglobin of 7.8 with hematocrit of 25.6, from hemoglobin of 6.9. From a symptomatic standpoint he reports feeling a little better. -History was working guaiac positive. Spoke to family members about this it appears he has a history of guaiac-positive stools for which he recently underwent a colonoscopy in September 2016 in [place]. Patient and family members reporting that "everything came out good" from that study. Medical records unavailable at the time of this dictation. -Lab showing stable hemoglobin of 8.2 on 03/26/2015. ASA was discontinued during this hospitalization. Please reassess ASA therapy on hospital follow up visit.   2. Acute on chronic renal failure -Lab work showing creatinine of 3.22 with BUN is 72. Lab work from 02/26/2015 revealed a creatinine of 2.3 with BUN of 48.4. He is being administered volume through transfusion of packed red blood cells -Despite administration of volume from blood transfusion his creatinine remains elevated at 2.29. -His urinalysis was unremarkable. -Case was discussed with Dr. Darrick Penna of nephrology. Patient currently follows Dr. Hyman Hopes at the Nephrology clinic.  -On 03/25/2015 creatinine trended down to 3.06 from 3.29 on 03/24/2015. Spoke with nephrology felt renal failure related to congestive heart failure. His torsemide dose is being increased to 100 mg by mouth twice a day with continuation of spironolactone at 25 mg  daily.  3. Status post fall -Patient reporting a fall at home which attributes to generalized weakness. -Will reassess after transfusion with 2 units packed red blood cells -Physical therapy consultation placed who recommended rehabilitation at skilled nursing facility. Patient insistent on going home after this hospitalization. -Spoke with family members who agree with rehabilitation at skilled nursing facility. Today he seems open to the idea of rehabilitation. -Patient discharged to SNF for acute rehab.   4. Chronic combined systolic and diastolic congestive heart failure -Last transthoracic echocardiogram was performed on 08/18/2014 which showed ejection fraction 45-50% with grade 2 diastolic dysfunction -Will monitor closely as he has been transfused with 2 units packed red blood cells today, currently does not have clinical evidence of acute decompensated congestive heart failure -On 03/25/2015 his torsemide was increased to 100 mg by mouth twice a day with a continuation of Lasix at 25 mg daily. -Spoke with nephrology felt congestion likely contributed to worsening kidney function.  -He was discharged on Torsemide 100 mg PO BID and spironolactone 25 mg P[o q daily, will need close outpatient follow up. Please check a BMP in 3-4 days and fax results to Dr Hyman Hopes of Nephrology.   5. Hypertension -His blood pressures on the low normal side on 03/23/2015 for which Norvasc was discontinued. -Blood pressures stable on discharge.  Consultations:  Nephrology  Discharge Exam: Filed Vitals:   03/26/15 0530 03/26/15 1100  BP: 119/29 125/50  Pulse: 68 74  Temp: 98.2 F (36.8 C) 98.4 F (36.9 C)  Resp: 18 18     General: Patient is awake and alert, no acute distress, states doing well.   Cardiovascular: 2/6 systolic ejection murmur, normal S1-S2  Respiratory: Diminished breath sounds bilaterally, suspect related to body habitus, no crackles or rhonchi  Abdomen: Obese, soft  nontender nondistended  Musculoskeletal: Venous stasis changes involving bilateral extremities, has 2+ bilateral pitting edema.   Discharge Instructions   Discharge Instructions    Call MD for:  difficulty breathing, headache or visual disturbances    Complete by:  As directed      Call MD for:  extreme fatigue    Complete by:  As directed      Call MD for:  hives    Complete by:  As directed      Call MD for:  persistant dizziness or light-headedness    Complete by:  As directed      Call MD for:  persistant nausea and vomiting    Complete by:  As directed      Call MD for:  redness, tenderness, or signs of infection (pain, swelling, redness, odor or green/yellow discharge around incision site)    Complete by:  As directed      Call MD for:  severe uncontrolled pain    Complete by:  As directed      Call MD for:  temperature >100.4    Complete by:  As directed      Call MD for:    Complete by:  As directed      Diet - low sodium heart healthy    Complete by:  As directed      Increase activity slowly    Complete by:  As directed           Current Discharge Medication List    CONTINUE these medications which have CHANGED   Details  HYDROcodone-acetaminophen (NORCO/VICODIN) 5-325 MG tablet Take 1-2 tablets by mouth every 6 (six) hours as needed for moderate pain. Qty: 20 tablet, Refills: 0      CONTINUE these medications which have NOT CHANGED   Details  benzoyl peroxide 10 % gel Apply 1 application topically 3 (three) times daily.    calcium-vitamin D (OSCAL WITH D) 500-200 MG-UNIT per tablet Take 1 tablet by mouth 2 (two) times daily.    cetirizine (ZYRTEC) 10 MG tablet Take 10 mg by mouth every evening.     Cholecalciferol (D-3-5) 5000 UNITS capsule Take 5,000 Units by mouth daily.     fluticasone (FLONASE) 50 MCG/ACT nasal spray Place 2 sprays into the nose daily.     insulin aspart (NOVOLOG) 100 UNIT/ML injection Inject 0-12 Units into the skin 3 (three) times  daily before meals. Sliding scale based on sugar level 101-150: 3 units 151-200: 4 units 201-250: 8 units 251-300: 12 units    insulin detemir (LEVEMIR) 100 UNIT/ML injection Inject 0.15 mLs (15 Units total) into the skin at bedtime. Qty: 10 mL, Refills: 12    labetalol (NORMODYNE) 200 MG tablet Take 1 tablet (200 mg total) by mouth 2 (two) times daily. Qty: 60 tablet, Refills: 1    latanoprost (XALATAN) 0.005 % ophthalmic solution Place 1 drop into the right eye at bedtime.     lidocaine (LIDODERM) 5 % Place 1 patch onto the skin daily as  needed (for pain). Remove & Discard patch within 12 hours or as directed by MD.    metolazone (ZAROXOLYN) 2.5 MG tablet TAKE 1 TABLET BY MOUTH TWICE PER WEEK SUNDAYS AND THURSDAYS. Qty: 8 tablet, Refills: 2    Multiple Vitamin (MULTIVITAMIN) tablet Take 1 tablet by mouth daily.    potassium chloride SA (K-DUR,KLOR-CON) 20 MEQ tablet Take 2 tablets (40 mEq total) by mouth 2 (two) times daily. Qty: 120 tablet, Refills: 6    sertraline (ZOLOFT) 25 MG tablet Take 25 mg by mouth at bedtime.    sodium chloride (OCEAN) 0.65 % SOLN nasal spray Place 1 spray into both nostrils as needed for congestion. Refills: 0    spironolactone (ALDACTONE) 25 MG tablet Take 1 tablet (25 mg total) by mouth daily.    Tamsulosin HCl (FLOMAX) 0.4 MG CAPS Take 0.4 mg by mouth at bedtime.     vitamin B-12 1000 MCG tablet Take 1 tablet (1,000 mcg total) by mouth daily. Qty: 30 tablet, Refills: 0      STOP taking these medications     amLODipine (NORVASC) 10 MG tablet      aspirin 81 MG chewable tablet      torsemide (DEMADEX) 20 MG tablet        Allergies  Allergen Reactions  . Lyrica [Pregabalin] Swelling    Causes him to have fluid buildup   . Sulfa Antibiotics Other (See Comments)    Night sweats  . Sulfonamide Derivatives Other (See Comments)    Night sweats  . Ambien [Zolpidem Tartrate] Other (See Comments)    Makes him cuss like a sailor  .  Penicillins Hives and Rash  . Piroxicam Hives  . Sulfamethoxazole Rash  . Zolpidem Other (See Comments)    Makes him cuss like a sailor   Follow-up Information    Please follow up.   Why:  See your Physicains as scheduled for recheck.  Continue pain medication      Follow up with Wende Neighbors, MD In 1 week.   Specialty:  Internal Medicine   Contact information:   [place] [id number] [phone]       Follow up with Sherril Croon, MD In 1 week.   Specialty:  Nephrology   Contact information:   [place] [id number] [phone]        The results of significant diagnostics from this hospitalization (including imaging, microbiology, ancillary and laboratory) are listed below for reference.    Significant Diagnostic Studies: Dg Pelvis 1-2 Views  03/23/2015  CLINICAL DATA:  Fall.  Right hip pain. EXAM: PELVIS - 1-2 VIEW COMPARISON:  None. FINDINGS: There is no evidence of pelvic fracture or diastasis. No pelvic bone lesions are seen. Minimal hypertrophic changes present at the superior acetabular margins in both hip joints. IMPRESSION: No pelvic fracture. Minimal osteoarthritis in the weight-bearing portions of both hip joints. Electronically Signed   By: Ilona Sorrel M.D.   On: 03/23/2015 08:17   Ct Head Wo Contrast  03/22/2015  CLINICAL DATA:  Trip and fall injury, subsequent patient fell from stretcher onto the left side. Now with head and neck pain. Examination is limited as the patient had difficulty fitting into the gantry and was short of breath while laying flat. EXAM: CT HEAD WITHOUT CONTRAST CT CERVICAL SPINE WITHOUT CONTRAST TECHNIQUE: Multidetector CT imaging of the head and cervical spine was performed following the standard protocol without intravenous contrast. Multiplanar CT image reconstructions of the cervical spine  were also generated. COMPARISON:  CT head 12/24/2014 FINDINGS: CT HEAD FINDINGS Diffuse cerebral atrophy. Mild ventricular  dilatation consistent with central atrophy. Low-attenuation changes in the deep white matter consistent with small vessel ischemia. Basal ganglia calcifications. No mass effect or midline shift. No abnormal extra-axial fluid collections. Gray-white matter junctions are distinct. Basal cisterns are not effaced. No evidence of acute intracranial hemorrhage. No depressed skull fractures. Opacification of left maxillary antrum and some of the bilateral ethmoid air cells. Opacification of some of the mastoid air cells bilaterally. Calcification and deformity of the left globe. Vascular calcifications. CT CERVICAL SPINE FINDINGS Cervical spine examination is significantly limited due to motion artifact. There is mild anterior subluxation of C3 on C4. This is likely degenerative but ligamentous injury is not excluded. Diffuse degenerative change throughout the cervical spine with narrowed cervical interspaces and endplate hypertrophic changes. Degenerative changes throughout the facet joints. Bone encroachment upon neural foramina bilaterally at multiple levels. No prevertebral soft tissue swelling. No vertebral compression deformities. C1-2 articulation appears intact. IMPRESSION: No acute intracranial abnormalities. Chronic atrophy and small vessel ischemic changes. Opacification of some of the paranasal sinuses. Examination of cervical spine is significant limited due to motion artifact. There is no gross displaced fracture identified. However, there is anterior subluxation of C3 on C4. This is likely degenerative but ligamentous injury is not excluded. Diffuse degenerative changes throughout cervical spine. Electronically Signed   By: Lucienne Capers M.D.   On: 03/22/2015 21:13   Ct Cervical Spine Wo Contrast  03/22/2015  CLINICAL DATA:  Trip and fall injury, subsequent patient fell from stretcher onto the left side. Now with head and neck pain. Examination is limited as the patient had difficulty fitting into the  gantry and was short of breath while laying flat. EXAM: CT HEAD WITHOUT CONTRAST CT CERVICAL SPINE WITHOUT CONTRAST TECHNIQUE: Multidetector CT imaging of the head and cervical spine was performed following the standard protocol without intravenous contrast. Multiplanar CT image reconstructions of the cervical spine were also generated. COMPARISON:  CT head 12/24/2014 FINDINGS: CT HEAD FINDINGS Diffuse cerebral atrophy. Mild ventricular dilatation consistent with central atrophy. Low-attenuation changes in the deep white matter consistent with small vessel ischemia. Basal ganglia calcifications. No mass effect or midline shift. No abnormal extra-axial fluid collections. Gray-white matter junctions are distinct. Basal cisterns are not effaced. No evidence of acute intracranial hemorrhage. No depressed skull fractures. Opacification of left maxillary antrum and some of the bilateral ethmoid air cells. Opacification of some of the mastoid air cells bilaterally. Calcification and deformity of the left globe. Vascular calcifications. CT CERVICAL SPINE FINDINGS Cervical spine examination is significantly limited due to motion artifact. There is mild anterior subluxation of C3 on C4. This is likely degenerative but ligamentous injury is not excluded. Diffuse degenerative change throughout the cervical spine with narrowed cervical interspaces and endplate hypertrophic changes. Degenerative changes throughout the facet joints. Bone encroachment upon neural foramina bilaterally at multiple levels. No prevertebral soft tissue swelling. No vertebral compression deformities. C1-2 articulation appears intact. IMPRESSION: No acute intracranial abnormalities. Chronic atrophy and small vessel ischemic changes. Opacification of some of the paranasal sinuses. Examination of cervical spine is significant limited due to motion artifact. There is no gross displaced fracture identified. However, there is anterior subluxation of C3 on C4.  This is likely degenerative but ligamentous injury is not excluded. Diffuse degenerative changes throughout cervical spine. Electronically Signed   By: Lucienne Capers M.D.   On: 03/22/2015 21:13   Mr Cervical Spine Wo  Contrast  03/23/2015  CLINICAL DATA:  Status post trip and fall 03/22/2015. Increasing diffuse weakness. Question ligamentous injury at C3-4. Initial encounter. EXAM: MRI CERVICAL SPINE WITHOUT CONTRAST TECHNIQUE: Multiplanar, multisequence MR imaging of the cervical spine was performed. No intravenous contrast was administered. COMPARISON:  CT cervical spine 03/22/2015. FINDINGS: The study is degraded by patient motion. Vertebral body height and signal are maintained. Trace anterolisthesis C3 on C4 is due to facet arthropathy. No evidence of ligamentous injury is seen. The craniocervical junction is normal and cervical cord signal is normal. Imaged paraspinous structures are unremarkable. Mucosal thickening is noted in the right maxillary sinus. C2-3: Facet degenerative disease is seen. The central canal and foramina appear open. C3-4: Facet arthropathy is seen. The disc is uncovered without bulging. The central canal is open. There is likely moderate bilateral foraminal narrowing although evaluation is degraded by motion. C4-5: Mild posterior bony ridging and uncovertebral disease. The central canal is open. There appears to be bilateral foraminal narrowing. C5-6: Small disc osteophyte complex, facet arthropathy and uncovertebral disease are seen. The ventral thecal sac is nearly effaced. The foramina are narrowed. C6-7: Small disc osteophyte complex effaces the ventral thecal sac. Uncovertebral disease appears worse on the left there is left worse than right foraminal narrowing. C7-T1: Minimal disc bulge without central canal or foraminal stenosis. IMPRESSION: Trace anterolisthesis C3 on C4 seen on comparison CT scan is due to facet degenerative disease. No evidence of posttraumatic change is  present on this examination. Mild multilevel disc bulging as described above most notable at C5-6 and C6-7 where the ventral thecal sac is effaced. There appears to be multilevel foraminal narrowing. Evaluation is limited by patient motion. Electronically Signed   By: Inge Rise M.D.   On: 03/23/2015 13:07   US Renal  03/24/2015  CLINICAL DATA:  Acute renal insufficiency EXAM: RENAL / URINARY TRACT ULTRASOUND COMPLETE COMPARISON:  None. FINDINGS: Right Kidney: Length: 14 cm in length. There is cortical thinning probable due to atrophy. No hydronephrosis. No renal calculus. Left Kidney: Not identified due to patient's large body habitus. Bladder: There is a Foley catheter within decompressed urinary bladder. IMPRESSION: No right hydronephrosis. Cortical thinning probable due to atrophy. There left kidney is not identified. Foley catheter within decompressed urinary bladder. Electronically Signed   By: Lahoma Crocker M.D.   On: 03/24/2015 18:34   Dg Chest Port 1 View  03/23/2015  CLINICAL DATA:  Shortness of breath, acute renal failure. EXAM: PORTABLE CHEST 1 VIEW COMPARISON:  December 24, 2014. FINDINGS: Stable cardiomegaly is noted. No pneumothorax or significant pleural effusion is noted. Mild diffuse interstitial densities are noted throughout both lungs, right greater than left, concerning for mild pulmonary edema. Stable mild central pulmonary vascular congestion is noted. Bony thorax is unremarkable. IMPRESSION: Stable cardiomegaly and central pulmonary vascular congestion is noted, with mild diffuse interstitial densities throughout both lungs concerning for pulmonary edema. Electronically Signed   By: Marijo Conception, M.D.   On: 03/23/2015 07:46   Dg Knee Complete 4 Views Right  03/22/2015  CLINICAL DATA:  Initial evaluation for acute trauma, fall. EXAM: RIGHT KNEE - COMPLETE 4+ VIEW COMPARISON:  None. FINDINGS: Diffuse osteopenia present, somewhat limiting evaluation for subtle nondisplaced  fractures. No acute fracture or dislocation identified. No joint effusion. Severe tricompartmental degenerative osteoarthrosis. Prepatellar soft tissue swelling. IMPRESSION: 1. No acute fracture or dislocation. 2. Prepatellar soft tissue swelling. 3. Severe tricompartmental degenerative osteoarthrosis. 4. Diffuse osteopenia. Electronically Signed   By: Pincus Badder.D.  On: 03/22/2015 21:15   Dg Hand Complete Left  03/22/2015  CLINICAL DATA:  Status post fall at home while walking. Left hand swelling. Initial encounter. EXAM: LEFT HAND - COMPLETE 3+ VIEW COMPARISON:  None. FINDINGS: There is no evidence of fracture or dislocation. Joint space narrowing is noted at the first carpometacarpal joint, and there is mild diffuse osteopenia of visualized osseous structures. The carpal rows are intact, and demonstrate normal alignment. Diffuse soft tissue swelling is noted about the wrist. IMPRESSION: No evidence of fracture or dislocation. Electronically Signed   By: Garald Balding M.D.   On: 03/22/2015 21:17   Dg Hand Complete Right  03/22/2015  CLINICAL DATA:  Patient fell at home while walking. Swelling in both hands. EXAM: RIGHT HAND - COMPLETE 3+ VIEW COMPARISON:  None. FINDINGS: Degenerative changes throughout the joints of the right hand and wrist. No evidence of acute fracture or subluxation. No focal bone lesion or bone destruction. Bone cortex and trabecular architecture appear intact. No radiopaque soft tissue foreign bodies. IMPRESSION: Prominent diffuse degenerative changes throughout the right hand and wrist. No acute fractures identified. Electronically Signed   By: Lucienne Capers M.D.   On: 03/22/2015 21:16    Microbiology: No results found for this or any previous visit (from the past 240 hour(s)).   Labs: Basic Metabolic Panel:  Recent Labs Lab 03/23/15 0118 03/23/15 0644 03/24/15 0520 03/25/15 0357 03/26/15 0503  NA 140 140 137 138 141  K 4.4 4.3 4.3 4.7 3.8  CL 107  106 105 105 106  CO2 $Re'22 24 23 25 26  'GTd$ GLUCOSE 107* 95 119* 112* 117*  BUN 72* 73* 76* 76* 68*  CREATININE 3.22* 3.27* 3.29* 3.06* 2.84*  CALCIUM 8.8* 8.7* 8.6* 8.7* 8.8*  PHOS  --   --   --  4.1 4.0   Liver Function Tests:  Recent Labs Lab 03/23/15 0118 03/23/15 0644 03/25/15 0357 03/26/15 0503  AST 36 35  --  33  ALT 23 23  --  25  ALKPHOS 58 59  --  58  BILITOT 1.4* 1.1  --  1.1  PROT 5.7* 5.5*  --  5.7*  ALBUMIN 3.3* 3.2* 3.0* 3.2*   No results for input(s): LIPASE, AMYLASE in the last 168 hours. No results for input(s): AMMONIA in the last 168 hours. CBC:  Recent Labs Lab 03/23/15 0118 03/23/15 0644 03/23/15 2305 03/24/15 0520 03/24/15 1835 03/25/15 0357 03/26/15 0503  WBC 5.7 4.7  --  5.4 4.6 4.5 4.5  NEUTROABS 3.8 2.9  --   --   --   --   --   HGB 7.0* 6.9* 7.9* 7.8* 8.0* 7.8* 8.1*  HCT 23.9* 22.8* 25.7* 25.6* 25.6* 25.4* 26.7*  MCV 87.9 87.7  --  87.1 87.1 87.9 88.7  PLT 99* 95*  --  96* 92* 81* 84*   Cardiac Enzymes:  Recent Labs Lab 03/24/15 1835  CKTOTAL 319   BNP: BNP (last 3 results)  Recent Labs  08/16/14 1945  BNP 869.7*    ProBNP (last 3 results) No results for input(s): PROBNP in the last 8760 hours.  CBG:  Recent Labs Lab 03/25/15 1133 03/25/15 1618 03/25/15 2113 03/26/15 0758 03/26/15 1144  GLUCAP 153* 140* 173* 106* 143*       Signed:  Jaequan Propes  [facility] 03/26/2015, 3:16 PM

## 2015-03-26 NOTE — Clinical Social Work Placement (Addendum)
   CLINICAL SOCIAL WORK PLACEMENT  NOTE 03/26/15 - DISCHARGED TO [place], [place]  Date:  03/26/2015  Patient Details  Name: [name] MRN: [id number] Date of Birth: Aug 23, 1931  Clinical Social Work is seeking post-discharge placement for this patient at the [place] level of care (*CSW will initial, date and re-position this form in  chart as items are completed):  Yes   Patient/family provided with [place] Work Department's list of facilities offering this level of care within the geographic area requested by the patient (or if unable, by the patient's family).  Yes   Patient/family informed of their freedom to choose among providers that offer the needed level of care, that participate in Medicare, Medicaid or managed care program needed by the patient, have an available bed and are willing to accept the patient.  Yes   Patient/family informed of [facility]'s ownership interest in [facility] and [facility], as well as of the fact that they are under no obligation to receive care at these facilities.  PASRR submitted to EDS on       PASRR number received on       Existing PASRR number confirmed on 03/26/15 (Patient received PASRR on 09/17/10)     FL2 transmitted to all facilities in geographic area requested by pt/family on 03/26/15     FL2 transmitted to all facilities within larger geographic area on       Patient informed that his/her managed care company has contracts with or will negotiate with certain facilities, including the following:        Yes   Patient/family informed of bed offers received.  Patient chooses bed at [facility]     Physician recommends and patient chooses bed at      Patient to be transferred to [facility] on 03/26/15.  Patient to be transferred to facility by Ambulance Corey Harold)     Patient family notified on 03/26/15 (At the bedside) of  transfer.  Name of family member notified:  Son Jearld Fenton and wife Hoskie Deloe.     PHYSICIAN       Additional Comment:    _______________________________________________ Sable Feil, LCSW 03/26/2015, 5:58 PM

## 2015-03-26 NOTE — Consult Note (Signed)
   [facility] CM Inpatient Consult   03/26/2015  [name] 01-03-1932 [id number] Patient is currently active [up to admission] with [facility] for chronic disease management services.  Patient has been engaged by a [employer].  Our community based plan of care has focused on disease management and community resource support.  Patient will receive a post discharge transition of care call and will be evaluated for monthly home visits for assessments and disease process education.  Made Inpatient Case Manager aware that [facility] following. Of note, [facility] Care Management services does not replace or interfere with any services that are needed or arranged by inpatient case management or social work.  For additional questions or referrals please contact: Natividad Brood, RN BSN [facility] Liaison  [phone] business mobile phone

## 2015-03-26 NOTE — Progress Notes (Signed)
Physical Therapy Treatment Note  Clinical Impression: RN called PT to assist pt back to bed. Pt left supine in bed with RN in room.    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 Admitted with symptomatic anemia  PT Time Calculation  PT Start Time (ACUTE ONLY) 1115  PT Stop Time (ACUTE ONLY) 1125  PT Time Calculation (min) (ACUTE ONLY) 10 min  Subjective Data  Subjective "I need to get back into bed"  Pt was sliding out of chair  Precautions  Precautions Fall  Precaution Comments obesity  Restrictions  Weight Bearing Restrictions No  Pain Assessment  Pain Assessment Faces  Faces Pain Scale 6  Pain Location buttocks  Bed Mobility  Overal bed mobility Needs Assistance  Bed Mobility Sit to Supine  Sit to supine Mod assist  General bed mobility comments assist to bring LEs back up into the bed  Transfers  Overall transfer level Needs assistance  Equipment used Rolling walker (2 wheeled)  Transfers Sit to/from Stand;Stand Pivot Transfers  Sit to Stand +2 physical assistance;Mod assist  Stand pivot transfers Min assist  General transfer comment from lower surface height, assist for initial boost  General Comments  General comments (skin integrity, edema, etc.) pt with DOE  PT - End of Session  Equipment Utilized During Treatment Gait belt  PT - Assessment/Plan  PT Frequency (ACUTE ONLY) Min 3X/week  PT General Charges  $$ ACUTE PT VISIT 1 Procedure  PT Treatments  $Therapeutic Activity 8-22 mins    Kittie Plater, PT, DPT Pager #: [phone] Office #: [phone]

## 2015-03-26 NOTE — NC FL2 (Signed)
[place] MEDICAID FL2 LEVEL OF CARE SCREENING TOOL     IDENTIFICATION  Patient Name: [name] Birthdate: 10-30-1931 Sex: male Admission Date (Current Location): 03/22/2015  [facility] and [place] Number: [employer] and Address:  [facility], [place] [address], [place], [place] [address]      Provider Number:    Attending Physician Name and Address:  Kelvin Cellar, MD  Relative Name and Phone Number:  Suyog Penix, spouse.  Phone number [phone].    Current Level of Care: Hospital Recommended Level of Care: Nursing Facility Prior Approval Number:    Date Approved/Denied:   PASRR Number: [id number] (Effective 09/17/10)  Discharge Plan: SNF    Current Diagnoses: Patient Active Problem List   Diagnosis Date Noted  . Acute on chronic renal insufficiency ([place]) 03/23/2015  . Chronic anemia 03/23/2015  . Guaiac positive stools 03/23/2015  . Thrombocytopenia ([place]) 03/23/2015  . Renal failure (ARF), acute on chronic (HCC) 03/23/2015  . Pressure ulcer, stage 2, left posterior thigh at crease of butt 08/20/2014  . Sleep apnea- on C-pap 08/19/2014  . Acute on chronic diastolic CHF (congestive heart failure), NYHA class 1 ([place]) 08/17/2014  . Acute on chronic diastolic CHF (congestive heart failure) ([place]) 08/16/2014  . Blindness of left eye 04/15/2013  . CKD (chronic kidney disease) stage 3, GFR 30-59 ml/min 04/14/2013  . Chronic pain syndrome 08/18/2012  . Chronic diastolic heart failure ([place]) 07/15/2012  . Acute on chronic diastolic heart failure ([place]) 10/08/2010  . Morbid obesity-BMI 51 05/30/2009  . Essential hypertension, benign 05/30/2009  . CAD- mild non obstructive Oct 2006 02/03/2009  . SVT/ PSVT/ PAT 02/03/2009  . Mixed hyperlipidemia 12/26/2008    Orientation ACTIVITIES/SOCIAL BLADDER RESPIRATION    Self, Time, Place  Passive Incontinent (Patient incontinent today, had been continent) Normal  BEHAVIORAL SYMPTOMS/MOOD  NEUROLOGICAL BOWEL NUTRITION STATUS      Continent Diet (Renal, carb modified)  PHYSICIAN VISITS COMMUNICATION OF NEEDS Height & Weight Skin    Verbally     Normal          AMBULATORY STATUS RESPIRATION    Supervision limited (Patient is minimal assist with ambulation per PT) Normal      Personal Care Assistance Level of Assistance  Bathing, Dressing, Feeding Bathing Assistance: Limited assistance Feeding assistance: Independent Dressing Assistance: Limited assistance      Functional Limitations Info  Sight, Hearing, Speech Sight Info: Impaired (Patient is blind in his left eye) Hearing Info: Impaired Speech Info: Adequate       SPECIAL CARE FACTORS FREQUENCY  PT (By licensed PT)     PT Frequency: Patient evaluated 11/25 - a minimum of 3x per week recommended.             Additional Factors Info  Code Status, Allergies, Insulin Sliding Scale Code Status Info: Full Code Allergies Info: Lyrica Pregabalin, Sulfa Antibiotics, Sulfonamide Derivatives, Ambien Zolpidem Tartrate, Penicillins, Piroxicam, Sulfamethoxazole, Zolpidem         Insulin Sliding Scale Info: Insulin sliding scale 3 times per day with meals       Current Medications (03/26/2015):  This is the current hospital active medication list Current Facility-Administered Medications  Medication Dose Route Frequency Provider Last Rate Last Dose  . 0.9 %  sodium chloride infusion   Intravenous Once Kelvin Cellar, MD      . acetaminophen (TYLENOL) tablet 650 mg  650 mg Oral Q6H PRN Rise Patience, MD   650 mg at 03/26/15  [id number]   Or  . acetaminophen (TYLENOL) suppository 650 mg  650 mg Rectal Q6H PRN Rise Patience, MD      . cholecalciferol (VITAMIN D) tablet 5,000 Units  5,000 Units Oral Daily Rise Patience, MD   5,000 Units at 03/26/15 1043  . ferumoxytol (FERAHEME) 510 mg in sodium chloride 0.9 % 100 mL IVPB  510 mg Intravenous Weekly Mauricia Area, MD   510 mg at 03/25/15 1351  .  fluticasone (FLONASE) 50 MCG/ACT nasal spray 2 spray  2 spray Each Nare Daily Rise Patience, MD   2 spray at 03/26/15 1044  . insulin aspart (novoLOG) injection 0-9 Units  0-9 Units Subcutaneous TID WC Rise Patience, MD   1 Units at 03/25/15 1818  . insulin detemir (LEVEMIR) injection 15 Units  15 Units Subcutaneous QHS Rise Patience, MD   15 Units at 03/25/15 2212  . labetalol (NORMODYNE) tablet 100 mg  100 mg Oral BID Mauricia Area, MD   100 mg at 03/26/15 1043  . latanoprost (XALATAN) 0.005 % ophthalmic solution 1 drop  1 drop Right Eye QHS Rise Patience, MD   1 drop at 03/24/15 2138  . lidocaine (LIDODERM) 5 % 1 patch  1 patch Transdermal Daily PRN Rise Patience, MD      . loratadine (CLARITIN) tablet 10 mg  10 mg Oral Daily Rise Patience, MD   10 mg at 03/26/15 1043  . multivitamin with minerals tablet 1 tablet  1 tablet Oral Daily Rise Patience, MD   1 tablet at 03/26/15 1043  . ondansetron (ZOFRAN) tablet 4 mg  4 mg Oral Q6H PRN Rise Patience, MD       Or  . ondansetron [facility]) injection 4 mg  4 mg Intravenous Q6H PRN Rise Patience, MD      . oxyCODONE (Oxy IR/ROXICODONE) immediate release tablet 5 mg  5 mg Oral Q6H PRN Kelvin Cellar, MD   5 mg at 03/26/15 0000  . potassium chloride SA (K-DUR,KLOR-CON) CR tablet 40 mEq  40 mEq Oral Daily Rise Patience, MD   40 mEq at 03/26/15 1043  . sertraline (ZOLOFT) tablet 25 mg  25 mg Oral QHS Rise Patience, MD   25 mg at 03/24/15 2139  . sodium chloride 0.9 % injection 3 mL  3 mL Intravenous Q12H Rise Patience, MD   3 mL at 03/26/15 1044  . spironolactone (ALDACTONE) tablet 25 mg  25 mg Oral Daily Rise Patience, MD   25 mg at 03/26/15 1043  . tamsulosin (FLOMAX) capsule 0.4 mg  0.4 mg Oral QHS Rise Patience, MD   0.4 mg at 03/24/15 2139  . torsemide (DEMADEX) tablet 100 mg  100 mg Oral BID Mauricia Area, MD   100 mg at 03/25/15 1812  . vitamin B-12  (CYANOCOBALAMIN) tablet 1,000 mcg  1,000 mcg Oral Daily Rise Patience, MD   1,000 mcg at 03/26/15 1043     Discharge Medications: Please see discharge summary for a list of discharge medications.  Relevant Imaging Results:  Relevant Lab Results:  Recent Labs    Additional Information    Sable Feil, LCSW

## 2015-03-26 NOTE — Clinical Social Work Note (Signed)
Clinical Social Work Assessment  Patient Details  Name: [name] MRN: [id number] Date of Birth: 1932-01-16  Date of referral:  03/26/15               Reason for consult:  Facility Placement                Permission sought to share information with:  Family Supports Permission granted to share information::  Yes, Verbal Permission Granted  Name::     Vergia Alberts, Jeneen Rinks "[name] Fenton" [name] and Dawn::     Relationship::  Daughter, son and spouse  Sport and exercise psychologist Information:  [phone] and wife [phone].  Housing/Transportation Living arrangements for the past 2 months:  [place] of Information:  Patient, Adult Children Patient Interpreter Needed:  None Criminal Activity/Legal Involvement Pertinent to Current Situation/Hospitalization:  No - Comment as needed Significant Relationships:  Adult Children, Spouse, Other Family Members Lives with:  Spouse Do you feel safe going back to the place where you live?  Yes (Patient feels safe at home but understands he needs short-term rehab before returning home.) Need for family participation in patient care:  Yes (Comment)  Care giving concerns:  None expressed by patient or family.   Social Worker assessment / plan:  CSW talked with patient, his daughter and later his son, [name] Fenton regarding discharge planning and recommendation of ST rehab. Patient is in agreement, as well as his family. Patient's main concern is how long he will have to be at the facility and this was discussed.  Employment status:    Forensic scientist:  Information systems manager, Programmer, applications PT Recommendations:  [place] / Referral to community resources:  [place]  Patient/Family's Response to care:  No concerns regarding care during this hospitalization.  Patient/Family's Understanding of and Emotional Response to Diagnosis, Current Treatment, and Prognosis:  Not discussed.  Emotional  Assessment Appearance:  Appears stated age Attitude/Demeanor/Rapport:  Other (Appropriate) Affect (typically observed):  Appropriate Orientation:  Oriented to Self, Oriented to Place, Oriented to  Time Alcohol / Substance use:  Never Used (Patient reports that he has never smoked and does not drink alcohol or use illicit drugs.) Psych involvement (Current and /or in the community):  No (Comment)  Discharge Needs  Concerns to be addressed:  Discharge Planning Concerns Readmission within the last 30 days:  No Current discharge risk:  None Barriers to Discharge:  No Barriers Identified   Sable Feil, LCSW 03/26/2015, 5:54 PM

## 2015-03-26 NOTE — Care Management Important Message (Signed)
Important Message  Patient Details  Name: [name] MRN: [id number] Date of Birth: 22-Sep-1931   Medicare Important Message Given:  Yes    [name] P [place] 03/26/2015, 2:42 PM

## 2015-03-26 NOTE — Progress Notes (Signed)
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 Admitted with symptomatic anemia    PT Comments    Pt con't to require assist for all mobility and ADLs. Pt unable to toilet himself safely or transfer/ambulate without assist. Pt extremely dyspneic with mobility requiring freq rest breaks, SpO2 >94% on RA. Spoke extensively regarding benefits of ST-SNF. Pt agreeable as he is aware "my breathing needs to get better." Pt remains to have decreased insight to physical deficits. Spoke with SW regarding d/c plans to SNF.   Follow Up Recommendations  SNF;Supervision/Assistance - 24 hour     Equipment Recommendations       Recommendations for Other Services       Precautions / Restrictions Precautions Precautions: Fall Precaution Comments: obesity, SOB Restrictions Weight Bearing Restrictions: No    Mobility  Bed Mobility Overal bed mobility: Needs Assistance Bed Mobility: Supine to Sit     Supine to sit: Min assist     General bed mobility comments: HOB elevated, labored effort, definite use of bed rail to pull self up. does not have hospital bed at home  Transfers Overall transfer level: Needs assistance Equipment used: Rolling walker (2 wheeled) Transfers: Sit to/from Stand Sit to Stand: +2 physical assistance;Mod assist         General transfer comment: bed elevated, v/cs for safe hand placement, + SOB, SpO2 at 94%  Ambulation/Gait Ambulation/Gait assistance: Min assist;+2 safety/equipment Ambulation Distance (Feet): 12 Feet Assistive device: Rolling walker (2 wheeled) Gait  Pattern/deviations: Step-through pattern;Decreased stride length;Wide base of support Gait velocity: slow Gait velocity interpretation: <1.8 ft/sec, indicative of risk for recurrent falls General Gait Details: pt with DOE requiring 4 standing rest breaks in 12 feet.   Stairs            Wheelchair Mobility    Modified Rankin (Stroke Patients Only)       Balance           Standing balance support: Bilateral upper extremity supported;During functional activity Standing balance-Leahy Scale: Poor Standing balance comment: pt stood x3 min holding RW while PT assist pt with holding urinal to allow pt to urinate                    Cognition Arousal/Alertness: Awake/alert Behavior During Therapy: WFL for tasks assessed/performed Overall Cognitive Status: Impaired/Different from baseline           Safety/Judgement: Decreased awareness of safety          Exercises      General Comments General comments (skin integrity, edema, etc.): pt with DOE with all mobility, SpO2 at >94%      Pertinent Vitals/Pain Pain Assessment: Faces Faces Pain Scale: Hurts little more Pain Location: bilat LE, R >L Pain Descriptors / Indicators: Aching Pain Intervention(s): Limited activity within patient's tolerance    Home Living                      Prior Function            PT Goals (current goals can now be found in the care plan section) Acute Rehab PT Goals Patient Stated Goal: "i'm open  to the idea of going to a rehab place for a short time" Progress towards PT goals: Progressing toward goals    Frequency  Min 3X/week    PT Plan Current plan remains appropriate    Co-evaluation             End of Session Equipment Utilized During Treatment: Gait belt Activity Tolerance: Patient limited by fatigue Patient left: in chair;with call bell/phone within reach     Time: 0910-0941 PT Time Calculation (min) (ACUTE ONLY): 31 min  Charges:  $Gait  Training: 8-22 mins $Therapeutic Activity: 8-22 mins                    G Codes:      Kingsley Callander 03/26/2015, 10:40 AM   Kittie Plater, PT, DPT Pager #: [phone] Office #: [phone]

## 2015-03-28 ENCOUNTER — Ambulatory Visit: Payer: Medicare Other | Admitting: Hematology

## 2015-03-28 DIAGNOSIS — D509 Iron deficiency anemia, unspecified: Secondary | ICD-10-CM | POA: Diagnosis not present

## 2015-03-28 DIAGNOSIS — D649 Anemia, unspecified: Secondary | ICD-10-CM | POA: Diagnosis not present

## 2015-03-28 DIAGNOSIS — N184 Chronic kidney disease, stage 4 (severe): Secondary | ICD-10-CM | POA: Diagnosis not present

## 2015-03-28 DIAGNOSIS — I509 Heart failure, unspecified: Secondary | ICD-10-CM | POA: Diagnosis not present

## 2015-03-28 DIAGNOSIS — E6609 Other obesity due to excess calories: Secondary | ICD-10-CM | POA: Diagnosis not present

## 2015-04-01 DIAGNOSIS — T1490 Injury, unspecified: Secondary | ICD-10-CM | POA: Diagnosis not present

## 2015-04-02 ENCOUNTER — Other Ambulatory Visit: Payer: Self-pay | Admitting: *Deleted

## 2015-04-02 NOTE — Patient Outreach (Signed)
[facility]) Care Management  04/02/2015  [name] [birth year] [id number]  I spoke with [name] by phone today when I called to speak with his wife about his progress at SNF. [name] indicated that he'd been discharged from the hospital on 11/28 to the [place] but left after 24 hours because "it's just not for me. I just didn't like it there."  [name] agreed to a home visit next Thursday but said "unless I don't feel up to it, then I'll let you know." [name] reports that he has a scheduled appointment with [name] and has an upcoming, previously scheduled appointment with [name] on 04/13/15.   I will see [name] at home on Thursday 04/05/15.    [facility]  [phone]

## 2015-04-05 ENCOUNTER — Ambulatory Visit: Payer: Self-pay | Admitting: *Deleted

## 2015-04-05 ENCOUNTER — Other Ambulatory Visit: Payer: Self-pay | Admitting: *Deleted

## 2015-04-05 NOTE — Patient Outreach (Signed)
[facility]) Care Management  04/05/2015  [name] 1931-10-19 [id number]  I reached out to [name] in preparation for our scheduled home visit today. [name] cancelled and asked if I would return a call to him "sometime later". This is [name] 3rd cancellation in 4 months. He said he "just didn't feel like it today."   I told [name] that I would call him again next week. If he continues to decline or cancel our appointments, I'll refer him to our telephonic program if he is willing to be followed telephonically.    [place] [facility]  [phone]

## 2015-04-13 ENCOUNTER — Emergency Department (HOSPITAL_COMMUNITY): Payer: Medicare Other

## 2015-04-13 ENCOUNTER — Encounter (HOSPITAL_COMMUNITY): Payer: Self-pay | Admitting: *Deleted

## 2015-04-13 ENCOUNTER — Inpatient Hospital Stay (HOSPITAL_COMMUNITY)
Admission: EM | Admit: 2015-04-13 | Discharge: 2015-04-24 | DRG: 291 | Disposition: A | Discharge status: Skilled Nursing Facility | Payer: Medicare Other | Attending: Internal Medicine | Admitting: Internal Medicine

## 2015-04-13 ENCOUNTER — Ambulatory Visit (INDEPENDENT_AMBULATORY_CARE_PROVIDER_SITE_OTHER): Payer: Medicare Other | Admitting: Cardiology

## 2015-04-13 ENCOUNTER — Encounter: Payer: Self-pay | Admitting: Cardiology

## 2015-04-13 VITALS — BP 100/64 | HR 66 | Ht 73.0 in | Wt 367.0 lb

## 2015-04-13 DIAGNOSIS — R0602 Shortness of breath: Secondary | ICD-10-CM

## 2015-04-13 DIAGNOSIS — G8929 Other chronic pain: Secondary | ICD-10-CM | POA: Diagnosis not present

## 2015-04-13 DIAGNOSIS — E662 Morbid (severe) obesity with alveolar hypoventilation: Secondary | ICD-10-CM | POA: Diagnosis present

## 2015-04-13 DIAGNOSIS — I5033 Acute on chronic diastolic (congestive) heart failure: Secondary | ICD-10-CM | POA: Diagnosis present

## 2015-04-13 DIAGNOSIS — J810 Acute pulmonary edema: Secondary | ICD-10-CM | POA: Diagnosis not present

## 2015-04-13 DIAGNOSIS — D696 Thrombocytopenia, unspecified: Secondary | ICD-10-CM | POA: Diagnosis present

## 2015-04-13 DIAGNOSIS — R41841 Cognitive communication deficit: Secondary | ICD-10-CM | POA: Diagnosis not present

## 2015-04-13 DIAGNOSIS — K7290 Hepatic failure, unspecified without coma: Secondary | ICD-10-CM | POA: Diagnosis present

## 2015-04-13 DIAGNOSIS — I4891 Unspecified atrial fibrillation: Secondary | ICD-10-CM | POA: Diagnosis not present

## 2015-04-13 DIAGNOSIS — K766 Portal hypertension: Secondary | ICD-10-CM | POA: Diagnosis present

## 2015-04-13 DIAGNOSIS — Z8249 Family history of ischemic heart disease and other diseases of the circulatory system: Secondary | ICD-10-CM

## 2015-04-13 DIAGNOSIS — N183 Chronic kidney disease, stage 3 unspecified: Secondary | ICD-10-CM | POA: Diagnosis present

## 2015-04-13 DIAGNOSIS — N289 Disorder of kidney and ureter, unspecified: Secondary | ICD-10-CM

## 2015-04-13 DIAGNOSIS — F0390 Unspecified dementia without behavioral disturbance: Secondary | ICD-10-CM | POA: Diagnosis present

## 2015-04-13 DIAGNOSIS — I890 Lymphedema, not elsewhere classified: Secondary | ICD-10-CM | POA: Diagnosis not present

## 2015-04-13 DIAGNOSIS — I480 Paroxysmal atrial fibrillation: Secondary | ICD-10-CM | POA: Diagnosis present

## 2015-04-13 DIAGNOSIS — I2510 Atherosclerotic heart disease of native coronary artery without angina pectoris: Secondary | ICD-10-CM | POA: Diagnosis not present

## 2015-04-13 DIAGNOSIS — D649 Anemia, unspecified: Secondary | ICD-10-CM | POA: Diagnosis present

## 2015-04-13 DIAGNOSIS — I509 Heart failure, unspecified: Secondary | ICD-10-CM | POA: Diagnosis not present

## 2015-04-13 DIAGNOSIS — E668 Other obesity: Secondary | ICD-10-CM | POA: Diagnosis not present

## 2015-04-13 DIAGNOSIS — E119 Type 2 diabetes mellitus without complications: Secondary | ICD-10-CM | POA: Diagnosis not present

## 2015-04-13 DIAGNOSIS — G9340 Encephalopathy, unspecified: Secondary | ICD-10-CM | POA: Diagnosis not present

## 2015-04-13 DIAGNOSIS — I10 Essential (primary) hypertension: Secondary | ICD-10-CM | POA: Diagnosis not present

## 2015-04-13 DIAGNOSIS — R161 Splenomegaly, not elsewhere classified: Secondary | ICD-10-CM | POA: Diagnosis present

## 2015-04-13 DIAGNOSIS — R278 Other lack of coordination: Secondary | ICD-10-CM | POA: Diagnosis not present

## 2015-04-13 DIAGNOSIS — I130 Hypertensive heart and chronic kidney disease with heart failure and stage 1 through stage 4 chronic kidney disease, or unspecified chronic kidney disease: Principal | ICD-10-CM | POA: Diagnosis present

## 2015-04-13 DIAGNOSIS — Z66 Do not resuscitate: Secondary | ICD-10-CM | POA: Diagnosis present

## 2015-04-13 DIAGNOSIS — I4819 Other persistent atrial fibrillation: Secondary | ICD-10-CM

## 2015-04-13 DIAGNOSIS — N184 Chronic kidney disease, stage 4 (severe): Secondary | ICD-10-CM | POA: Diagnosis present

## 2015-04-13 DIAGNOSIS — I2609 Other pulmonary embolism with acute cor pulmonale: Secondary | ICD-10-CM | POA: Diagnosis present

## 2015-04-13 DIAGNOSIS — F330 Major depressive disorder, recurrent, mild: Secondary | ICD-10-CM | POA: Diagnosis not present

## 2015-04-13 DIAGNOSIS — N189 Chronic kidney disease, unspecified: Secondary | ICD-10-CM

## 2015-04-13 DIAGNOSIS — R1312 Dysphagia, oropharyngeal phase: Secondary | ICD-10-CM | POA: Diagnosis not present

## 2015-04-13 DIAGNOSIS — R531 Weakness: Secondary | ICD-10-CM | POA: Diagnosis not present

## 2015-04-13 DIAGNOSIS — E1122 Type 2 diabetes mellitus with diabetic chronic kidney disease: Secondary | ICD-10-CM | POA: Diagnosis present

## 2015-04-13 DIAGNOSIS — J811 Chronic pulmonary edema: Secondary | ICD-10-CM | POA: Diagnosis not present

## 2015-04-13 DIAGNOSIS — E669 Obesity, unspecified: Secondary | ICD-10-CM | POA: Diagnosis not present

## 2015-04-13 DIAGNOSIS — R4182 Altered mental status, unspecified: Secondary | ICD-10-CM | POA: Insufficient documentation

## 2015-04-13 DIAGNOSIS — N39498 Other specified urinary incontinence: Secondary | ICD-10-CM | POA: Diagnosis not present

## 2015-04-13 DIAGNOSIS — E1321 Other specified diabetes mellitus with diabetic nephropathy: Secondary | ICD-10-CM | POA: Diagnosis not present

## 2015-04-13 DIAGNOSIS — I872 Venous insufficiency (chronic) (peripheral): Secondary | ICD-10-CM | POA: Diagnosis present

## 2015-04-13 DIAGNOSIS — I272 Other secondary pulmonary hypertension: Secondary | ICD-10-CM | POA: Diagnosis present

## 2015-04-13 DIAGNOSIS — Z794 Long term (current) use of insulin: Secondary | ICD-10-CM | POA: Diagnosis not present

## 2015-04-13 DIAGNOSIS — Z515 Encounter for palliative care: Secondary | ICD-10-CM | POA: Diagnosis not present

## 2015-04-13 DIAGNOSIS — I481 Persistent atrial fibrillation: Secondary | ICD-10-CM | POA: Diagnosis not present

## 2015-04-13 DIAGNOSIS — G4733 Obstructive sleep apnea (adult) (pediatric): Secondary | ICD-10-CM | POA: Diagnosis present

## 2015-04-13 DIAGNOSIS — N179 Acute kidney failure, unspecified: Secondary | ICD-10-CM | POA: Diagnosis present

## 2015-04-13 DIAGNOSIS — Z6841 Body Mass Index (BMI) 40.0 and over, adult: Secondary | ICD-10-CM

## 2015-04-13 DIAGNOSIS — J302 Other seasonal allergic rhinitis: Secondary | ICD-10-CM | POA: Diagnosis not present

## 2015-04-13 DIAGNOSIS — D509 Iron deficiency anemia, unspecified: Secondary | ICD-10-CM | POA: Diagnosis present

## 2015-04-13 DIAGNOSIS — J9811 Atelectasis: Secondary | ICD-10-CM | POA: Diagnosis not present

## 2015-04-13 DIAGNOSIS — I447 Left bundle-branch block, unspecified: Secondary | ICD-10-CM | POA: Diagnosis present

## 2015-04-13 DIAGNOSIS — M7989 Other specified soft tissue disorders: Secondary | ICD-10-CM | POA: Diagnosis present

## 2015-04-13 DIAGNOSIS — IMO0001 Reserved for inherently not codable concepts without codable children: Secondary | ICD-10-CM

## 2015-04-13 DIAGNOSIS — R601 Generalized edema: Secondary | ICD-10-CM | POA: Diagnosis not present

## 2015-04-13 DIAGNOSIS — R279 Unspecified lack of coordination: Secondary | ICD-10-CM | POA: Diagnosis not present

## 2015-04-13 DIAGNOSIS — J3089 Other allergic rhinitis: Secondary | ICD-10-CM | POA: Diagnosis not present

## 2015-04-13 DIAGNOSIS — E785 Hyperlipidemia, unspecified: Secondary | ICD-10-CM | POA: Diagnosis present

## 2015-04-13 DIAGNOSIS — K7460 Unspecified cirrhosis of liver: Secondary | ICD-10-CM | POA: Diagnosis not present

## 2015-04-13 DIAGNOSIS — M6281 Muscle weakness (generalized): Secondary | ICD-10-CM | POA: Diagnosis not present

## 2015-04-13 DIAGNOSIS — J301 Allergic rhinitis due to pollen: Secondary | ICD-10-CM | POA: Diagnosis not present

## 2015-04-13 DIAGNOSIS — D631 Anemia in chronic kidney disease: Secondary | ICD-10-CM | POA: Diagnosis not present

## 2015-04-13 LAB — BRAIN NATRIURETIC PEPTIDE: B NATRIURETIC PEPTIDE 5: 798 pg/mL — AB (ref 0.0–100.0)

## 2015-04-13 LAB — CBG MONITORING, ED: Glucose-Capillary: 108 mg/dL — ABNORMAL HIGH (ref 65–99)

## 2015-04-13 LAB — BASIC METABOLIC PANEL
Anion gap: 11 (ref 5–15)
BUN: 55 mg/dL — AB (ref 6–20)
CALCIUM: 8.9 mg/dL (ref 8.9–10.3)
CO2: 24 mmol/L (ref 22–32)
CREATININE: 2.64 mg/dL — AB (ref 0.61–1.24)
Chloride: 105 mmol/L (ref 101–111)
GFR calc Af Amer: 25 mL/min — ABNORMAL LOW (ref >60)
GFR, EST NON AFRICAN AMERICAN: 21 mL/min — AB (ref >60)
GLUCOSE: 120 mg/dL — AB (ref 65–99)
POTASSIUM: 4.5 mmol/L (ref 3.5–5.1)
Sodium: 140 mmol/L (ref 135–145)

## 2015-04-13 LAB — I-STAT TROPONIN, ED: Troponin i, poc: 0.04 ng/mL (ref 0.00–0.08)

## 2015-04-13 LAB — CBC
HCT: 27.8 % — ABNORMAL LOW (ref 39.0–52.0)
Hemoglobin: 8.1 g/dL — ABNORMAL LOW (ref 13.0–17.0)
MCH: 27.6 pg (ref 26.0–34.0)
MCHC: 29.1 g/dL — ABNORMAL LOW (ref 30.0–36.0)
MCV: 94.9 fL (ref 78.0–100.0)
PLATELETS: 101 10*3/uL — AB (ref 150–400)
RBC: 2.93 MIL/uL — AB (ref 4.22–5.81)
RDW: 24.1 % — AB (ref 11.5–15.5)
WBC: 5.4 10*3/uL (ref 4.0–10.5)

## 2015-04-13 MED ORDER — TAMSULOSIN HCL 0.4 MG PO CAPS
0.4000 mg | ORAL_CAPSULE | Freq: Every day | ORAL | Status: DC
  Administered 2015-04-13 – 2015-04-23 (×10): 0.4 mg via ORAL
  Filled 2015-04-13 (×10): qty 1

## 2015-04-13 MED ORDER — SERTRALINE HCL 50 MG PO TABS
25.0000 mg | ORAL_TABLET | Freq: Every day | ORAL | Status: DC
  Administered 2015-04-13 – 2015-04-23 (×9): 25 mg via ORAL
  Filled 2015-04-13 (×10): qty 1

## 2015-04-13 MED ORDER — INSULIN DETEMIR 100 UNIT/ML ~~LOC~~ SOLN
15.0000 [IU] | Freq: Every day | SUBCUTANEOUS | Status: DC
  Administered 2015-04-13: 15 [IU] via SUBCUTANEOUS
  Filled 2015-04-13 (×2): qty 0.15

## 2015-04-13 MED ORDER — SODIUM CHLORIDE 0.9 % IJ SOLN: 3.0000 mL | INTRAMUSCULAR | Status: DC | PRN

## 2015-04-13 MED ORDER — CALCIUM CARBONATE-VITAMIN D 500-200 MG-UNIT PO TABS
1.0000 | ORAL_TABLET | Freq: Two times a day (BID) | ORAL | Status: DC
  Administered 2015-04-13 – 2015-04-24 (×20): 1 via ORAL
  Filled 2015-04-13 (×21): qty 1

## 2015-04-13 MED ORDER — LATANOPROST 0.005 % OP SOLN
1.0000 [drp] | Freq: Every day | OPHTHALMIC | Status: DC
  Administered 2015-04-13 – 2015-04-23 (×10): 1 [drp] via OPHTHALMIC
  Filled 2015-04-13 (×2): qty 2.5

## 2015-04-13 MED ORDER — ADULT MULTIVITAMIN W/MINERALS CH
1.0000 | ORAL_TABLET | Freq: Every day | ORAL | Status: DC
  Administered 2015-04-14 – 2015-04-24 (×11): 1 via ORAL
  Filled 2015-04-13 (×11): qty 1

## 2015-04-13 MED ORDER — ACETAMINOPHEN 325 MG PO TABS
650.0000 mg | ORAL_TABLET | ORAL | Status: DC | PRN
  Administered 2015-04-13 – 2015-04-24 (×18): 650 mg via ORAL
  Filled 2015-04-13 (×19): qty 2

## 2015-04-13 MED ORDER — SODIUM CHLORIDE 0.9 % IJ SOLN
3.0000 mL | Freq: Two times a day (BID) | INTRAMUSCULAR | Status: DC
  Administered 2015-04-13 – 2015-04-24 (×19): 3 mL via INTRAVENOUS

## 2015-04-13 MED ORDER — INSULIN ASPART 100 UNIT/ML ~~LOC~~ SOLN
0.0000 [IU] | Freq: Every day | SUBCUTANEOUS | Status: DC
  Administered 2015-04-18: 1 [IU] via SUBCUTANEOUS

## 2015-04-13 MED ORDER — POTASSIUM CHLORIDE CRYS ER 20 MEQ PO TBCR
20.0000 meq | EXTENDED_RELEASE_TABLET | Freq: Every day | ORAL | Status: AC
  Administered 2015-04-14 – 2015-04-15 (×2): 20 meq via ORAL
  Filled 2015-04-13 (×2): qty 1

## 2015-04-13 MED ORDER — FUROSEMIDE 10 MG/ML IJ SOLN
80.0000 mg | Freq: Once | INTRAMUSCULAR | Status: AC
  Administered 2015-04-13: 80 mg via INTRAVENOUS
  Filled 2015-04-13: qty 8

## 2015-04-13 MED ORDER — ONE-DAILY MULTI VITAMINS PO TABS: 1.0000 | ORAL_TABLET | Freq: Every day | ORAL | Status: DC

## 2015-04-13 MED ORDER — INSULIN ASPART 100 UNIT/ML ~~LOC~~ SOLN
0.0000 [IU] | Freq: Three times a day (TID) | SUBCUTANEOUS | Status: DC
  Administered 2015-04-14 – 2015-04-17 (×5): 1 [IU] via SUBCUTANEOUS
  Administered 2015-04-17 (×2): 2 [IU] via SUBCUTANEOUS
  Administered 2015-04-18: 1 [IU] via SUBCUTANEOUS
  Administered 2015-04-18 – 2015-04-19 (×2): 2 [IU] via SUBCUTANEOUS
  Administered 2015-04-20 (×2): 1 [IU] via SUBCUTANEOUS
  Administered 2015-04-20 – 2015-04-22 (×5): 2 [IU] via SUBCUTANEOUS
  Administered 2015-04-22: 1 [IU] via SUBCUTANEOUS
  Administered 2015-04-23 (×2): 2 [IU] via SUBCUTANEOUS

## 2015-04-13 MED ORDER — LORATADINE 10 MG PO TABS
10.0000 mg | ORAL_TABLET | Freq: Every day | ORAL | Status: DC
Start: 2015-04-14 — End: 2015-04-24
  Administered 2015-04-14 – 2015-04-24 (×11): 10 mg via ORAL
  Filled 2015-04-13 (×11): qty 1

## 2015-04-13 MED ORDER — SPIRONOLACTONE 25 MG PO TABS
25.0000 mg | ORAL_TABLET | Freq: Every day | ORAL | Status: DC
  Administered 2015-04-14 – 2015-04-24 (×11): 25 mg via ORAL
  Filled 2015-04-13 (×11): qty 1

## 2015-04-13 MED ORDER — LABETALOL HCL 200 MG PO TABS
200.0000 mg | ORAL_TABLET | Freq: Two times a day (BID) | ORAL | Status: DC
  Administered 2015-04-13 – 2015-04-24 (×20): 200 mg via ORAL
  Filled 2015-04-13 (×21): qty 1

## 2015-04-13 MED ORDER — VITAMIN B-12 1000 MCG PO TABS
1000.0000 ug | ORAL_TABLET | Freq: Every day | ORAL | Status: DC
  Administered 2015-04-14 – 2015-04-24 (×11): 1000 ug via ORAL
  Filled 2015-04-13 (×11): qty 1

## 2015-04-13 MED ORDER — ONDANSETRON HCL 4 MG/2ML IJ SOLN
4.0000 mg | Freq: Four times a day (QID) | INTRAMUSCULAR | Status: DC | PRN
  Administered 2015-04-22: 4 mg via INTRAVENOUS
  Filled 2015-04-13: qty 2

## 2015-04-13 MED ORDER — HEPARIN SODIUM (PORCINE) 5000 UNIT/ML IJ SOLN
5000.0000 [IU] | Freq: Three times a day (TID) | INTRAMUSCULAR | Status: DC
  Administered 2015-04-13 – 2015-04-18 (×13): 5000 [IU] via SUBCUTANEOUS
  Filled 2015-04-13 (×13): qty 1

## 2015-04-13 MED ORDER — FUROSEMIDE 10 MG/ML IJ SOLN
100.0000 mg | Freq: Two times a day (BID) | INTRAVENOUS | Status: DC
  Administered 2015-04-14: 100 mg via INTRAVENOUS
  Filled 2015-04-13 (×3): qty 10

## 2015-04-13 MED ORDER — METOLAZONE 2.5 MG PO TABS
2.5000 mg | ORAL_TABLET | Freq: Two times a day (BID) | ORAL | Status: AC
  Administered 2015-04-14 – 2015-04-15 (×4): 2.5 mg via ORAL
  Filled 2015-04-13 (×5): qty 1

## 2015-04-13 MED ORDER — SODIUM CHLORIDE 0.9 % IV SOLN: 250.0000 mL | INTRAVENOUS | Status: DC | PRN

## 2015-04-13 NOTE — ED Notes (Signed)
Admitting at bedside 

## 2015-04-13 NOTE — Patient Instructions (Signed)
Your physician recommends that you continue on your current medications as directed. Please refer to the Current Medication list given to you today. Please go to [facility] Emergency Department now.
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
## 2015-04-13 NOTE — Progress Notes (Signed)
Pt seen by Dr. Domenic Polite today in [place] and sent to ER.  We will follow beginning in AM please call if needed prior to that time.
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
## 2015-04-13 NOTE — ED Notes (Signed)
Pt was sent here by dr Domenic Polite for increased leg swelling and generalized swelling. Pt reports SOB as well.

## 2015-04-14 ENCOUNTER — Inpatient Hospital Stay (HOSPITAL_COMMUNITY): Payer: Medicare Other

## 2015-04-14 DIAGNOSIS — I509 Heart failure, unspecified: Secondary | ICD-10-CM

## 2015-04-14 LAB — GLUCOSE, CAPILLARY
GLUCOSE-CAPILLARY: 81 mg/dL (ref 65–99)
GLUCOSE-CAPILLARY: 121 mg/dL — AB (ref 65–99)
GLUCOSE-CAPILLARY: 138 mg/dL — AB (ref 65–99)
Glucose-Capillary: 130 mg/dL — ABNORMAL HIGH (ref 65–99)
Glucose-Capillary: 133 mg/dL — ABNORMAL HIGH (ref 65–99)

## 2015-04-14 LAB — BASIC METABOLIC PANEL
Anion gap: 10 (ref 5–15)
BUN: 57 mg/dL — AB (ref 6–20)
CALCIUM: 8.9 mg/dL (ref 8.9–10.3)
CO2: 26 mmol/L (ref 22–32)
Chloride: 105 mmol/L (ref 101–111)
Creatinine, Ser: 2.63 mg/dL — ABNORMAL HIGH (ref 0.61–1.24)
GFR calc Af Amer: 25 mL/min — ABNORMAL LOW (ref >60)
GFR, EST NON AFRICAN AMERICAN: 21 mL/min — AB (ref >60)
Glucose, Bld: 116 mg/dL — ABNORMAL HIGH (ref 65–99)
POTASSIUM: 4.2 mmol/L (ref 3.5–5.1)
SODIUM: 141 mmol/L (ref 135–145)

## 2015-04-14 LAB — MRSA PCR SCREENING: MRSA by PCR: NEGATIVE

## 2015-04-14 MED ORDER — PERFLUTREN LIPID MICROSPHERE
1.0000 mL | INTRAVENOUS | Status: AC | PRN
  Administered 2015-04-14: 3 mL via INTRAVENOUS
  Filled 2015-04-14: qty 10

## 2015-04-14 MED ORDER — FUROSEMIDE 10 MG/ML IJ SOLN
80.0000 mg | Freq: Two times a day (BID) | INTRAMUSCULAR | Status: DC
  Administered 2015-04-14 – 2015-04-21 (×14): 80 mg via INTRAVENOUS
  Filled 2015-04-14 (×15): qty 8

## 2015-04-14 NOTE — Plan of Care (Signed)
Problem: Pain Managment: Goal: General experience of comfort will improve Outcome: Completed/Met Date Met:  04/14/15 Patient not in pain when laying still, legs are very sensitive to touch as well as arms, tylenol seems to be effective.

## 2015-04-14 NOTE — Progress Notes (Signed)
  Echocardiogram 2D Echocardiogram with Definity  has been performed.  [name] M 04/14/2015, 1:51 PM

## 2015-04-14 NOTE — Progress Notes (Signed)
Patient Name: [name] Date of Encounter: 04/14/2015  Principal Problem:   Acute on chronic diastolic CHF (congestive heart failure) ([place]) Active Problems:   Morbid obesity-BMI 34   Essential hypertension, benign   CAD- mild non obstructive Oct 2006   CKD (chronic kidney disease) stage 3, GFR 30-59 ml/min   Chronic anemia   Thrombocytopenia (HCC)   IDDM (insulin dependent diabetes mellitus) ([place])   Atrial fibrillation (HCC)   OSA treated with BiPAP   Length of Stay: 1  SUBJECTIVE  The patient feels very SOB, desaturating with minimal motion in bed.  CURRENT MEDS . calcium-vitamin D  1 tablet Oral BID  . furosemide  100 mg Intravenous BID  . heparin  5,000 Units Subcutaneous 3 times per day  . insulin aspart  0-5 Units Subcutaneous QHS  . insulin aspart  0-9 Units Subcutaneous TID WC  . labetalol  200 mg Oral BID  . latanoprost  1 drop Right Eye QHS  . loratadine  10 mg Oral Daily  . metolazone  2.5 mg Oral BID  . multivitamin with minerals  1 tablet Oral Daily  . potassium chloride  20 mEq Oral Daily  . sertraline  25 mg Oral QHS  . sodium chloride  3 mL Intravenous Q12H  . spironolactone  25 mg Oral Daily  . tamsulosin  0.4 mg Oral QHS  . vitamin B-12  1,000 mcg Oral Daily    OBJECTIVE  Filed Vitals:   04/14/15 0140 04/14/15 0305 04/14/15 0500 04/14/15 1043  BP: 123/66 112/38  108/38  Pulse: 66 69  73  Temp:   98.3 F (36.8 C)   TempSrc:   Oral   Resp:      Height:      Weight:      SpO2: 96% 96%      Intake/Output Summary (Last 24 hours) at 04/14/15 1112 Last data filed at 04/14/15 0602  Gross per 24 hour  Intake    643 ml  Output    650 ml  Net     -7 ml   Filed Weights   04/13/15 1630 04/13/15 2203  Weight: 367 lb (166.47 kg) 389 lb (176.449 kg)    PHYSICAL EXAM  General: anasarca, 389 lbs Neuro: ConfusedHEENT:  Normal  Lungs:  Resp regular and unlabored, crakles B/L, labored breathing. Heart: RRR no s3, s4, or  murmurs. Abdomen: Soft, non-tender, non-distended, BS + x 4.  Extremities: anasarca, swollen skin on the enture body  Accessory Clinical Findings  CBC  Recent Labs  04/13/15 1636  WBC 5.4  HGB 8.1*  HCT 27.8*  MCV 94.9  PLT 99991111*   Basic Metabolic Panel  Recent Labs  04/13/15 1636 04/14/15 0320  NA 140 141  K 4.5 4.2  CL 105 105  CO2 24 26  GLUCOSE 120* 116*  BUN 55* 57*  CREATININE 2.64* 2.63*  CALCIUM 8.9 8.9   TELE: SR, PVCs   ASSESSMENT AND PLAN  1. Evidence of marked fluid overload and lymphedema with history of diastolic heart failure. This is complicated by renal insufficiency, anemia, and possibly atrial fibrillation. He is not going to be able to be adequately managed at home, sent to the ER by Dr Domenic Polite yesterday. The patient is in anasarca, SOB at rest with desaturation down to 80' with minimal manipulations in bed. His Crea is 2.6, previously 2.8 - 3.0, he has listed sulfa drug allergy, we will start furosemide lasix 80 mg iv BID. A consult  to nephrology should be placed. Considering polymorbidity, age, dementia I would strongly recommend to consult palliative care.  2. Acute on chronic diastolic heart failure, last LVEF 45-50% in April. Repeat echocardiogram to help guide therapy. Study may well be limited however given his size.  3. Suspected acute on chronic renal failure, last creatinine was 2.8 in November. He follows with Dr. Justin Mend.  4. Atrial fibrillation documented by ECG in November, no prior history of same. He is not aware of any palpitations and I am not certain about the duration. Very complex patient, at this point would not pursue oral anticoagulant until his clinical course and plan are better understood.  5. History of nonobstructive CAD based on prior assessment, no obvious angina.  6. Obstructive sleep apnea, on CPAP but not clear about consistency.    Signed, Dorothy Spark MD, [facility] 04/14/2015

## 2015-04-14 NOTE — Plan of Care (Signed)
Problem: Skin Integrity: Goal: Risk for impaired skin integrity will decrease Outcome: Progressing Patient with MSAD to perineum, cleansed with anti fungal powder and barrier cream applied

## 2015-04-14 NOTE — Progress Notes (Signed)
[facility] PROGRESS NOTE  [name] [id number] DOB: [birth year] DOA: 04/13/2015 PCP: Wende Neighbors, MD  Assessment/Plan: 81 y.o. male with a history of Diastolic CHF, Stage III CKD, Atrial Fibrillation, IDDM, HTN. OSA, Lymphedema, chronci leg edema who presents to the ED with complaints of worsening SOB, and edema of both legs for quite some time. -He was seen by his Cardiologist Dr. Domenic Polite today and advised to go to the ED for hospitalization due to acute on chronic CHF.   1.Acute on chronic diastolic CHF. Previous echo. LEVF 40-45%.  -Cards recommended 2D ECHO. cont diuresis per cardiology. cont BB, patient is not a good candidate for ACE/ARB due to renal issues. Consider hydralazine+NTG if BP tolerates  2.Atrial fibrillation. Not a good candidate for Anticoagulant Rx. Cont rate control On Labetalol 3. IDDM. Hold Levemir due to borderline glucose. Check ha1c. Cont ISS 4.OSA treated with BiPAP. BIPAP qhs and PRN 5. Morbid obesity-BMI 51. Needs Weight Loss 6. CKD. Lymphedema, chronci leg edema. monitor urine output. Labs.      Code Status: full Family Communication: d/w patient  (indicate person spoken with, relationship, and if by phone, the number) Disposition Plan: on IV diuresis    Consultants:  Cardiology   Procedures:  Pend echo   Antibiotics:  none (indicate start date, and stop date if known)  HPI/Subjective: Alert. No distress  Objective: Filed Vitals:   04/14/15 0305 04/14/15 0500  BP: 112/38   Pulse: 69   Temp:  98.3 F (36.8 C)  Resp:      Intake/Output Summary (Last 24 hours) at 04/14/15 1034 Last data filed at 04/14/15 0602  Gross per 24 hour  Intake    643 ml  Output    650 ml  Net     -7 ml   Filed Weights   04/13/15 1630 04/13/15 2203  Weight: 166.47 kg (367 lb) 176.449 kg (389 lb)    Exam:   General:  No distress   Cardiovascular: s1,s2 poor heart sound   Respiratory: BL diminished LL  Abdomen: obese. NT.    Musculoskeletal: extensive leg edema   Data Reviewed: Basic Metabolic Panel:  Recent Labs Lab 04/13/15 1636 04/14/15 0320  NA 140 141  K 4.5 4.2  CL 105 105  CO2 24 26  GLUCOSE 120* 116*  BUN 55* 57*  CREATININE 2.64* 2.63*  CALCIUM 8.9 8.9   Liver Function Tests: No results for input(s): AST, ALT, ALKPHOS, BILITOT, PROT, ALBUMIN in the last 168 hours. No results for input(s): LIPASE, AMYLASE in the last 168 hours. No results for input(s): AMMONIA in the last 168 hours. CBC:  Recent Labs Lab 04/13/15 1636  WBC 5.4  HGB 8.1*  HCT 27.8*  MCV 94.9  PLT 101*   Cardiac Enzymes: No results for input(s): CKTOTAL, CKMB, CKMBINDEX, TROPONINI in the last 168 hours. BNP (last 3 results)  Recent Labs  08/16/14 1945 04/13/15 1646  BNP 869.7* 798.0*    ProBNP (last 3 results) No results for input(s): PROBNP in the last 8760 hours.  CBG:  Recent Labs Lab 04/13/15 2109 04/14/15 0811  GLUCAP 108* 81    Recent Results (from the past 240 hour(s))  MRSA PCR Screening     Status: None   Collection Time: 04/13/15 10:31 PM  Result Value Ref Range Status   MRSA by PCR NEGATIVE NEGATIVE Final    Comment:        The GeneXpert MRSA Assay (FDA approved for NASAL specimens only), is one component of  a comprehensive MRSA colonization surveillance program. It is not intended to diagnose MRSA infection nor to guide or monitor treatment for MRSA infections.      Studies: Dg Chest Port 1 View  04/13/2015  CLINICAL DATA:  81 year old male with acute shortness of breath. EXAM: PORTABLE CHEST 1 VIEW COMPARISON:  03/23/2015 and prior chest radiographs FINDINGS: Cardiomegaly and mild pulmonary vascular congestion noted. Bibasilar opacities likely represent atelectasis. There is no evidence of pneumothorax. No acute bony abnormalities are identified. IMPRESSION: Cardiomegaly, mild pulmonary vascular congestion and probable bibasilar atelectasis. Electronically Signed   By:  Margarette Canada M.D.   On: 04/13/2015 17:41    Scheduled Meds: . calcium-vitamin D  1 tablet Oral BID  . furosemide  100 mg Intravenous BID  . heparin  5,000 Units Subcutaneous 3 times per day  . insulin aspart  0-5 Units Subcutaneous QHS  . insulin aspart  0-9 Units Subcutaneous TID WC  . insulin detemir  15 Units Subcutaneous QHS  . labetalol  200 mg Oral BID  . latanoprost  1 drop Right Eye QHS  . loratadine  10 mg Oral Daily  . metolazone  2.5 mg Oral BID  . multivitamin with minerals  1 tablet Oral Daily  . potassium chloride  20 mEq Oral Daily  . sertraline  25 mg Oral QHS  . sodium chloride  3 mL Intravenous Q12H  . spironolactone  25 mg Oral Daily  . tamsulosin  0.4 mg Oral QHS  . vitamin B-12  1,000 mcg Oral Daily   Continuous Infusions:   Principal Problem:   Acute on chronic diastolic CHF (congestive heart failure) (HCC) Active Problems:   Morbid obesity-BMI 51   Essential hypertension, benign   CAD- mild non obstructive Oct 2006   CKD (chronic kidney disease) stage 3, GFR 30-59 ml/min   Chronic anemia   Thrombocytopenia (HCC)   IDDM (insulin dependent diabetes mellitus) ([place])   Atrial fibrillation (HCC)   OSA treated with BiPAP    Time spent: >35 minutes     Kinnie Feil  [facility] Pager [phone]. If 7PM-7AM, please contact night-coverage at [email or web address], password [facility] 04/14/2015, 10:34 AM  LOS: 1 day

## 2015-04-14 NOTE — Plan of Care (Signed)
Problem: Cardiac: Goal: Ability to achieve and maintain adequate cardiopulmonary perfusion will improve Outcome: Progressing Patient receiving IV lasix, condom cath in place to measure output.

## 2015-04-15 ENCOUNTER — Encounter: Payer: Self-pay | Admitting: *Deleted

## 2015-04-15 DIAGNOSIS — I481 Persistent atrial fibrillation: Secondary | ICD-10-CM

## 2015-04-15 DIAGNOSIS — N184 Chronic kidney disease, stage 4 (severe): Secondary | ICD-10-CM

## 2015-04-15 DIAGNOSIS — R601 Generalized edema: Secondary | ICD-10-CM

## 2015-04-15 DIAGNOSIS — I5033 Acute on chronic diastolic (congestive) heart failure: Secondary | ICD-10-CM

## 2015-04-15 DIAGNOSIS — N179 Acute kidney failure, unspecified: Secondary | ICD-10-CM

## 2015-04-15 DIAGNOSIS — J9621 Acute and chronic respiratory failure with hypoxia: Secondary | ICD-10-CM

## 2015-04-15 DIAGNOSIS — I10 Essential (primary) hypertension: Secondary | ICD-10-CM

## 2015-04-15 LAB — CBC
HEMATOCRIT: 27 % — AB (ref 39.0–52.0)
HEMOGLOBIN: 8.1 g/dL — AB (ref 13.0–17.0)
MCH: 28.4 pg (ref 26.0–34.0)
MCHC: 30 g/dL (ref 30.0–36.0)
MCV: 94.7 fL (ref 78.0–100.0)
PLATELETS: 84 10*3/uL — AB (ref 150–400)
RBC: 2.85 MIL/uL — ABNORMAL LOW (ref 4.22–5.81)
RDW: 23.9 % — AB (ref 11.5–15.5)
WBC: 4.1 10*3/uL (ref 4.0–10.5)

## 2015-04-15 LAB — BASIC METABOLIC PANEL
ANION GAP: 11 (ref 5–15)
BUN: 57 mg/dL — AB (ref 6–20)
CALCIUM: 9 mg/dL (ref 8.9–10.3)
CO2: 26 mmol/L (ref 22–32)
CREATININE: 2.58 mg/dL — AB (ref 0.61–1.24)
Chloride: 103 mmol/L (ref 101–111)
GFR calc Af Amer: 25 mL/min — ABNORMAL LOW (ref >60)
GFR calc non Af Amer: 22 mL/min — ABNORMAL LOW (ref >60)
GLUCOSE: 107 mg/dL — AB (ref 65–99)
Potassium: 3.7 mmol/L (ref 3.5–5.1)
Sodium: 140 mmol/L (ref 135–145)

## 2015-04-15 LAB — GLUCOSE, CAPILLARY
GLUCOSE-CAPILLARY: 132 mg/dL — AB (ref 65–99)
Glucose-Capillary: 103 mg/dL — ABNORMAL HIGH (ref 65–99)
Glucose-Capillary: 122 mg/dL — ABNORMAL HIGH (ref 65–99)
Glucose-Capillary: 140 mg/dL — ABNORMAL HIGH (ref 65–99)

## 2015-04-15 NOTE — Progress Notes (Addendum)
[facility] PROGRESS NOTE  [name] [id number] DOB: January 13, 1932 DOA: 04/13/2015 PCP: Wende Neighbors, MD  Assessment/Plan: 81 y.o. male with a history of Diastolic CHF, Stage III CKD, Atrial Fibrillation, IDDM, HTN. OSA, Lymphedema, chronci leg edema who presents to the ED with complaints of worsening SOB, and edema of both legs for quite some time. -He was seen by his Cardiologist Dr. Domenic Polite today and advised to go to the ED for hospitalization due to acute on chronic CHF.     1.Acute on chronic diastolic CHF. Previous echo. LEVF 40-45%.  -Cards recommended 2D ECHO. cont aggressive diuresis per cardiology. cont BB, patient is not a good candidate for ACE/ARB due to renal issues. Consider hydralazine+NTG if BP tolerates   2.Atrial fibrillation. Not a good candidate for Anticoagulant Rx. Very complex patient, at this point would not pursue oral anticoagulant until his clinical course and plan are better understood.  3. IDDM. Hold Levemir due to borderline glucose. Check ha1c. Cont ISS  4.OSA treated with BiPAP. BIPAP qhs and PRN  5. Morbid obesity-BMI 51. Needs Weight Loss  6. CKD stage IV. Lymphedema, chronci leg edema. Complicated by renal insufficiency, atrial fibrillation, anemia, continue Lasix 80 mg IV BID Baseline renal function 2.5-2.6, currently creatinine 2.58, continue diuresis    Code Status: full Family Communication: d/w patient  (indicate person spoken with, relationship, and if by phone, the number) Disposition Plan: on IV diuresis    Consultants:  Cardiology   Procedures:  Pend echo   Antibiotics:  none (indicate start date, and stop date if known)  HPI/Subjective: Stable overnight, shortness of breath is better  Objective: Filed Vitals:   04/15/15 0500 04/15/15 0915  BP: 135/51 138/94  Pulse: 71 83  Temp: 97.9 F (36.6 C)   Resp: 24     Intake/Output Summary (Last 24 hours) at 04/15/15 1223 Last data filed at 04/15/15 1009  Gross per 24 hour  Intake    780 ml  Output   2250 ml  Net  -1470 ml   Filed Weights   04/13/15 2203 04/14/15 1147 04/15/15 0500  Weight: 176.449 kg (389 lb) 176.449 kg (389 lb) 174.136 kg (383 lb 14.4 oz)    Exam: General: anasarca, 389 lbs Neuro: ConfusedHEENT: Normal Lungs: Resp regular and unlabored, crakles B/L, labored breathing. Heart: RRR no s3, s4, or murmurs. Abdomen: Soft, non-tender, non-distended, BS + x 4.  Extremities: anasarca, swollen skin on the enture body  Data Reviewed: Basic Metabolic Panel:  Recent Labs Lab 04/13/15 1636 04/14/15 0320 04/15/15 0340  NA 140 141 140  K 4.5 4.2 3.7  CL 105 105 103  CO2 24 26 26   GLUCOSE 120* 116* 107*  BUN 55* 57* 57*  CREATININE 2.64* 2.63* 2.58*  CALCIUM 8.9 8.9 9.0   Liver Function Tests: No results for input(s): AST, ALT, ALKPHOS, BILITOT, PROT, ALBUMIN in the last 168 hours. No results for input(s): LIPASE, AMYLASE in the last 168 hours. No results for input(s): AMMONIA in the last 168 hours. CBC:  Recent Labs Lab 04/13/15 1636 04/15/15 0340  WBC 5.4 4.1  HGB 8.1* 8.1*  HCT 27.8* 27.0*  MCV 94.9 94.7  PLT 101* 84*   Cardiac Enzymes: No results for input(s): CKTOTAL, CKMB, CKMBINDEX, TROPONINI in the last 168 hours. BNP (last 3 results)  Recent Labs  08/16/14 1945 04/13/15 1646  BNP 869.7* 798.0*    ProBNP (last 3 results) No results for input(s): PROBNP in the last 8760 hours.  CBG:  Recent Labs  Lab 04/14/15 1636 04/14/15 2112 04/14/15 2127 04/15/15 0716 04/15/15 1117  GLUCAP 130* 133* 138* 103* 132*    Recent Results (from the past 240 hour(s))  MRSA PCR Screening     Status: None   Collection Time: 04/13/15 10:31 PM  Result Value Ref Range Status   MRSA by PCR NEGATIVE NEGATIVE Final    Comment:        The GeneXpert MRSA Assay (FDA approved for NASAL specimens only), is one component of a comprehensive MRSA colonization surveillance program. It is not intended to  diagnose MRSA infection nor to guide or monitor treatment for MRSA infections.      Studies: Dg Chest Port 1 View  04/13/2015  CLINICAL DATA:  81 year old male with acute shortness of breath. EXAM: PORTABLE CHEST 1 VIEW COMPARISON:  03/23/2015 and prior chest radiographs FINDINGS: Cardiomegaly and mild pulmonary vascular congestion noted. Bibasilar opacities likely represent atelectasis. There is no evidence of pneumothorax. No acute bony abnormalities are identified. IMPRESSION: Cardiomegaly, mild pulmonary vascular congestion and probable bibasilar atelectasis. Electronically Signed   By: Margarette Canada M.D.   On: 04/13/2015 17:41    Scheduled Meds: . calcium-vitamin D  1 tablet Oral BID  . furosemide  80 mg Intravenous BID  . heparin  5,000 Units Subcutaneous 3 times per day  . insulin aspart  0-5 Units Subcutaneous QHS  . insulin aspart  0-9 Units Subcutaneous TID WC  . labetalol  200 mg Oral BID  . latanoprost  1 drop Right Eye QHS  . loratadine  10 mg Oral Daily  . metolazone  2.5 mg Oral BID  . multivitamin with minerals  1 tablet Oral Daily  . sertraline  25 mg Oral QHS  . sodium chloride  3 mL Intravenous Q12H  . spironolactone  25 mg Oral Daily  . tamsulosin  0.4 mg Oral QHS  . vitamin B-12  1,000 mcg Oral Daily   Continuous Infusions:   Principal Problem:   Acute on chronic diastolic CHF (congestive heart failure) (HCC) Active Problems:   Morbid obesity-BMI 51   Essential hypertension, benign   CAD- mild non obstructive Oct 2006   CKD (chronic kidney disease) stage 3, GFR 30-59 ml/min   Chronic anemia   Thrombocytopenia (HCC)   IDDM (insulin dependent diabetes mellitus) ([place])   Atrial fibrillation (HCC)   OSA treated with BiPAP    Time spent: >35 minutes     [facility] Pager [phone]. If 7PM-7AM, please contact night-coverage at [email or web address], password [facility] 04/15/2015, 12:23 PM  LOS: 2 days

## 2015-04-15 NOTE — Evaluation (Signed)
Physical Therapy Evaluation Patient Details Name: [name] MRN: [id number] DOB: 02-01-1932 Today's Date: 04/15/2015   History of Present Illness  This 81 y.o male admitted from cardiologist's office with worsening SOB, increased edema bil. LEs due to acute on chronic CHF.  PMH includes:  Stage III CKD, A-Fib, IDDM, HTN, OSA, lymphedema, chronic Lt LE edema, morbid obesity   Clinical Impression  Pt admitted with above diagnosis. Pt currently with functional limitations due to the deficits listed below (see PT Problem List). Please see notes below and OT note for PLOF, pt unable to relay information to PT this session due to AMS.  Pt currently requires +2 mod A to power up to standing w/ DOE 3/4 and pt w/ fear of falling. Will need SNF level of care at d/c given pt's current functional limitations.  Pt will benefit from skilled PT to increase their independence and safety with mobility to allow discharge to the venue listed below.      Follow Up Recommendations SNF;Supervision/Assistance - 24 hour    Equipment Recommendations  Other (comment) (TBD)    Recommendations for Other Services       Precautions / Restrictions Precautions Precautions: Fall Precaution Comments: obesity Restrictions Weight Bearing Restrictions: No      Mobility  Bed Mobility Overal bed mobility: Needs Assistance;+2 for physical assistance Bed Mobility: Supine to Sit;Sit to Supine     Supine to sit: Min assist Sit to supine: Min assist;+2 for physical assistance   General bed mobility comments: Use of bed rails and pt performs valsalva maneuver.  HHA provided for pt to pull up to sitting.    Transfers Overall transfer level: Needs assistance Equipment used: Rolling walker (2 wheeled) Transfers: Sit to/from Stand Sit to Stand: Mod assist;+2 physical assistance         General transfer comment: A to power up to standing.  Pt w/ flexed posture despite cues to stand upright and expresses fear of  falling.  Cues provided for proper hand placement and technique.  Ambulation/Gait             General Gait Details: unable to attempt this session for pt and thearpist safety  Stairs            Wheelchair Mobility    Modified Rankin (Stroke Patients Only)       Balance Overall balance assessment: Needs assistance;History of Falls Sitting-balance support: Feet supported Sitting balance-Leahy Scale: Fair     Standing balance support: Bilateral upper extremity supported;During functional activity Standing balance-Leahy Scale: Poor                               Pertinent Vitals/Pain Pain Assessment: No/denies pain Pain Intervention(s): Limited activity within patient's tolerance;Monitored during session    [place] expects to be discharged to:: Skilled nursing facility Living Arrangements: Spouse/significant other Available Help at Discharge: Family;Available 24 hours/day Type of Home: House Home Access: Ramped entrance     Home Layout: One level Home Equipment: Walker - 2 wheels;Wheelchair - [employer];Tub bench;Wheelchair - power Additional Comments: Pt lives with 24 y.o. spouse who is his primary caregiver. All information taken from OT note as pt unable to relay information to PT due to AMS.    Prior Function Level of Independence: Needs assistance   Gait / Transfers Assistance Needed: Pt has been essentially bedbound, per son, since admission 11/16.  Prior to that, he was able to ambulate  household distances mod I using RW   ADL's / Homemaking Assistance Needed: Pt has been max to total A with ADLs since admission 11/16, per son report.  Wife has been providing self care assist at bed level. Prior to admission in 11/16, pt was mod I with ADLs         Hand Dominance   Dominant Hand: Right    Extremity/Trunk Assessment   Upper Extremity Assessment: Generalized weakness           Lower Extremity  Assessment: Generalized weakness (edema Lt LE>Rt LE)         Communication   Communication: HOH (difficulty to understand as pt mumbles)  Cognition Arousal/Alertness: Awake/alert Behavior During Therapy: Restless Overall Cognitive Status: Impaired/Different from baseline Area of Impairment: Orientation;Attention;Safety/judgement;Problem solving Orientation Level: Disoriented to;Place;Time;Situation Current Attention Level: Sustained Memory: Decreased short-term memory   Safety/Judgement: Decreased awareness of safety;Decreased awareness of deficits   Problem Solving: Slow processing;Difficulty sequencing;Requires verbal cues;Requires tactile cues General Comments: Continues to ask if he is going to the hospital and if this PT is going to call the ambulance, is curious why he isn't getting into a WC.    General Comments General comments (skin integrity, edema, etc.): DOE 3/4 during sit<>stand    Exercises        Assessment/Plan    PT Assessment Patient needs continued PT services  PT Diagnosis Difficulty walking;Generalized weakness;Acute pain;Altered mental status   PT Problem List Decreased strength;Decreased range of motion;Decreased activity tolerance;Decreased balance;Decreased mobility;Decreased cognition;Decreased knowledge of use of DME;Decreased safety awareness;Cardiopulmonary status limiting activity;Decreased knowledge of precautions;Obesity  PT Treatment Interventions DME instruction;Gait training;Functional mobility training;Therapeutic activities;Therapeutic exercise;Balance training;Neuromuscular re-education;Cognitive remediation;Patient/family education;Wheelchair mobility training   PT Goals (Current goals can be found in the Care Plan section) Acute Rehab PT Goals Patient Stated Goal: none stated PT Goal Formulation: Patient unable to participate in goal setting Time For Goal Achievement: 05/06/15 Potential to Achieve Goals: Fair    Frequency Min 2X/week    Barriers to discharge        Co-evaluation               End of Session   Activity Tolerance: Patient limited by fatigue Patient left: in bed;with call bell/phone within reach;with bed alarm set Nurse Communication: Mobility status;Need for lift equipment;Other (comment) (pt needs to be cleaned up and sheets changed)         Time: [id number] PT Time Calculation (min) (ACUTE ONLY): 17 min   Charges:   PT Evaluation $Initial PT Evaluation Tier I: 1 Procedure     PT G Codes:       Joslyn Hy PT, DPT [phone] Pager: [phone] 04/15/2015, 4:46 PM

## 2015-04-15 NOTE — Evaluation (Signed)
Occupational Therapy Evaluation Patient Details Name: [name] MRN: [id number] DOB: 13-Aug-1931 Today's Date: 04/15/2015    History of Present Illness This 81 y.o male admitted from cardiologist's office with worsening SOB, increased edema bil. LEs due to acute on chronic CHF.  PMH includes:  Stage III CKD, A-Fib, IDDM, HTN, OSA, lymphedema, chronic Lt LE edema, morbid obesity    Clinical Impression   Pt admitted with above. He demonstrates the below listed deficits and will benefit from continued OT to maximize safety and independence with BADLs.  Pt presents to OT with deconditioning/generalized weakness.  He requires max - total A for ADLs.  02 sats >92% on RA with DOE 3/4.   Pt has essentially been bedbound since admission 3 weeks ago with wife providing assist with all ADLs.  Prior to the last admission, pt was mod I with ADLs and ambulated short distances mod I.   He discharged to SNF last admission, but only stayed overnight before insisting on going home, per son.  Pt and son, both feel SNF is most appropriate discharge plan at this time. (Family does NOT want [facility] or [facility] due to having bad past experiences).  Will follow acutely.       Follow Up Recommendations  SNF    Equipment Recommendations  None recommended by OT    Recommendations for Other Services       Precautions / Restrictions Precautions Precautions: Fall Precaution Comments: obesity      Mobility Bed Mobility Overal bed mobility: Needs Assistance Bed Mobility: Rolling Rolling: Min assist   Supine to sit: Min assist Sit to supine: Min assist   General bed mobility comments: Min A for Lt LE and to raise trunk.  He is very dependent on use of bedrails   Transfers                 General transfer comment: unable to safely assist this date     Balance Overall balance assessment: Needs assistance Sitting-balance support: Feet supported Sitting balance-Leahy Scale: Fair                                      ADL Overall ADL's : Needs assistance/impaired Eating/Feeding: Set up;Bed level (per RN report )   Grooming: Wash/dry hands;Wash/dry face;Oral care;Brushing hair;Set up;Bed level   Upper Body Bathing: Moderate assistance;Bed level   Lower Body Bathing: Total assistance;Bed level   Upper Body Dressing : Maximal assistance;Bed level;Sitting   Lower Body Dressing: Total assistance;Bed level   Toilet Transfer: Total assistance Toilet Transfer Details (indicate cue type and reason): unable to safely attempt Toileting- Clothing Manipulation and Hygiene: Total assistance;Bed level       Functional mobility during ADLs: Minimal assistance (bed mobility only )       Vision     Perception     Praxis      Pertinent Vitals/Pain Pain Assessment: Faces Faces Pain Scale: Hurts little more Pain Location: Lt LE  Pain Descriptors / Indicators: Grimacing Pain Intervention(s): Limited activity within patient's tolerance;Monitored during session;Repositioned     Hand Dominance Right   Extremity/Trunk Assessment Upper Extremity Assessment Upper Extremity Assessment: Generalized weakness   Lower Extremity Assessment Lower Extremity Assessment: Defer to PT evaluation       Communication Communication Communication: HOH   Cognition Arousal/Alertness: Awake/alert Behavior During Therapy: WFL for tasks assessed/performed Overall Cognitive Status: Impaired/Different from baseline Area of Impairment:  Orientation;Attention;Safety/judgement;Problem solving Orientation Level: Disoriented to;Place;Time Current Attention Level: Sustained Memory: Decreased short-term memory   Safety/Judgement: Decreased awareness of safety;Decreased awareness of deficits   Problem Solving: Slow processing;Difficulty sequencing;Requires verbal cues;Requires tactile cues     General Comments       Exercises       Shoulder Instructions      Home Living  Family/patient expects to be discharged to:: Skilled nursing facility Living Arrangements: Spouse/significant other Available Help at Discharge: Family;Available 24 hours/day Type of Home: House Home Access: Ramped entrance     Home Layout: One level     Bathroom Shower/Tub: Teacher, early years/pre: Handicapped height Bathroom Accessibility: Yes How Accessible: Accessible via walker Home Equipment: [place] - 2 wheels;Wheelchair - [employer];Tub bench;Wheelchair - power   Additional Comments: Pt lives with 65 y.o. spouse who is his primary caregiver      Prior Functioning/Environment Level of Independence: Needs assistance  Gait / Transfers Assistance Needed: Pt has been essentially bedbound, per son, since admission 11/16.  Prior to that, he was able to ambulate household distances mod I using RW  ADL's / Homemaking Assistance Needed: Pt has been max to total A with ADLs since admission 11/16, per son report.  Wife has been providing self care assist at bed level. Prior to admission in 11/16, pt was mod I with ADLs         OT Diagnosis: Generalized weakness;Cognitive deficits;Acute pain   OT Problem List: Decreased strength;Decreased activity tolerance;Impaired balance (sitting and/or standing);Decreased cognition;Decreased safety awareness;Decreased knowledge of use of DME or AE;Cardiopulmonary status limiting activity;Obesity;Pain;Increased edema   OT Treatment/Interventions: Self-care/ADL training;Therapeutic exercise;Energy conservation;DME and/or AE instruction;Therapeutic activities;Cognitive remediation/compensation;Patient/family education;Balance training    OT Goals(Current goals can be found in the care plan section) Acute Rehab OT Goals Patient Stated Goal: to get stronger  OT Goal Formulation: With patient/family Time For Goal Achievement: 04/29/15 Potential to Achieve Goals: Good ADL Goals Pt Will Perform Grooming: with min guard  assist;sitting Pt Will Perform Upper Body Bathing: with min assist;sitting Pt Will Transfer to Toilet: with mod assist;with +2 assist;bedside commode Pt/caregiver will Perform Home Exercise Program: Increased strength;Right Upper extremity;Left upper extremity;With theraband  OT Frequency: Min 2X/week   Barriers to D/C: Decreased caregiver support  unsure family can manage pt at current level        Co-evaluation              End of Session Nurse Communication: Mobility status  Activity Tolerance: Patient limited by fatigue Patient left: in bed;with call bell/phone within reach;with bed alarm set;with family/visitor present   Time: 1045-1140 OT Time Calculation (min): 55 min Charges:  OT General Charges $OT Visit: 1 Procedure OT Evaluation $Initial OT Evaluation Tier I: 1 Procedure OT Treatments $Therapeutic Activity: 38-52 mins G-Codes:    Desera Graffeo M May 03, 2015, 12:09 PM

## 2015-04-15 NOTE — Care Management Note (Signed)
Case Management Note  Patient Details  Name: [name] MRN: [id number] Date of Birth: 23-Jan-1932  Subjective/Objective:                  CHF  Action/Plan: CM spoke with patient at the bedside. Patient lives with his wife at home (nurse reports his wife is 79 years old). Reports his daughter works as a Occupational psychologist at [employer]. She arranges his medications in pill boxes and he reports taking his medications daily with the exception of missing an evening dose last week. He does not weigh himself (unable to do so). His medications are delivered to his home. Reports he overdosed on "one little pill" recently which resulted in an admission to [facility]. Reports having a colonoscopy while inpatient there. PCP is Dr. Wende Neighbors in [place]. CM will continue to follow. Awaiting PT evaluation.   Expected Discharge Date:                  Expected Discharge Plan:  [place]  In-House Referral:     Discharge planning Services  CM Consult  Post Acute Care Choice:    Choice offered to:     DME Arranged:    DME Agency:     HH Arranged:    [facility] Agency:     Status of Service:  In process, will continue to follow  Medicare Important Message Given:    Date Medicare IM Given:    Medicare IM give by:    Date Additional Medicare IM Given:    Additional Medicare Important Message give by:     If discussed at [place] of Stay Meetings, dates discussed:    Additional Comments:  Apolonio Schneiders, RN 04/15/2015, 10:34 AM

## 2015-04-15 NOTE — Progress Notes (Signed)
Patient Name: [name] Date of Encounter: 04/15/2015  Principal Problem:   Acute on chronic diastolic CHF (congestive heart failure) ([place]) Active Problems:   Morbid obesity-BMI 23   Essential hypertension, benign   CAD- mild non obstructive Oct 2006   CKD (chronic kidney disease) stage 3, GFR 30-59 ml/min   Chronic anemia   Thrombocytopenia (HCC)   IDDM (insulin dependent diabetes mellitus) ([place])   Atrial fibrillation (HCC)   OSA treated with BiPAP   Length of Stay: 2  SUBJECTIVE  The patient feels minimally better, able to sleep without oxygen.   CURRENT MEDS . calcium-vitamin D  1 tablet Oral BID  . furosemide  80 mg Intravenous BID  . heparin  5,000 Units Subcutaneous 3 times per day  . insulin aspart  0-5 Units Subcutaneous QHS  . insulin aspart  0-9 Units Subcutaneous TID WC  . labetalol  200 mg Oral BID  . latanoprost  1 drop Right Eye QHS  . loratadine  10 mg Oral Daily  . metolazone  2.5 mg Oral BID  . multivitamin with minerals  1 tablet Oral Daily  . sertraline  25 mg Oral QHS  . sodium chloride  3 mL Intravenous Q12H  . spironolactone  25 mg Oral Daily  . tamsulosin  0.4 mg Oral QHS  . vitamin B-12  1,000 mcg Oral Daily    OBJECTIVE  Filed Vitals:   04/14/15 2018 04/15/15 0027 04/15/15 0500 04/15/15 0915  BP: 132/62  135/51 138/94  Pulse: 73 69 71 83  Temp: 98.9 F (37.2 C)  97.9 F (36.6 C)   TempSrc:      Resp: 25 16 24    Height:      Weight:   383 lb 14.4 oz (174.136 kg)   SpO2: 93% 93% 94%     Intake/Output Summary (Last 24 hours) at 04/15/15 1016 Last data filed at 04/15/15 1009  Gross per 24 hour  Intake    780 ml  Output   2450 ml  Net  -1670 ml   Filed Weights   04/13/15 2203 04/14/15 1147 04/15/15 0500  Weight: 389 lb (176.449 kg) 389 lb (176.449 kg) 383 lb 14.4 oz (174.136 kg)    PHYSICAL EXAM  General: anasarca, 389 lbs Neuro: ConfusedHEENT:  Normal  Lungs:  Resp regular and unlabored, crakles B/L, labored  breathing. Heart: RRR no s3, s4, or murmurs. Abdomen: Soft, non-tender, non-distended, BS + x 4.  Extremities: anasarca, swollen skin on the enture body  Accessory Clinical Findings  CBC  Recent Labs  04/13/15 1636 04/15/15 0340  WBC 5.4 4.1  HGB 8.1* 8.1*  HCT 27.8* 27.0*  MCV 94.9 94.7  PLT 101* 84*   Basic Metabolic Panel  Recent Labs  04/14/15 0320 04/15/15 0340  NA 141 140  K 4.2 3.7  CL 105 103  CO2 26 26  GLUCOSE 116* 107*  BUN 57* 57*  CREATININE 2.63* 2.58*  CALCIUM 8.9 9.0   TELE: SR, PVCs   ASSESSMENT AND PLAN  1. Evidence of marked fluid overload and lymphedema with history of diastolic heart failure. This is complicated by renal insufficiency, anemia, and possibly atrial fibrillation. He is not going to be able to be adequately managed at home, sent to the ER by Dr Domenic Polite on 12/15. The patient is in anasarca, SOB at rest with desaturation down to 80' with minimal manipulations in bed. His Crea is 2.6, previously 2.8 - 3.0, he has listed sulfa drug allergy,  we started furosemide 80 mg iv BID yesterday with good response negative 1.6 L overnight and stable crea 2.63--> 2.58. A consult to nephrology should be placed. Considering polymorbidity, age, dementia I would strongly recommend to consult palliative care.  2. Acute on chronic diastolic heart failure, last LVEF 45-50% in April. Now 60%, with grade 2 diatsolic dysfunction and elevated filling pressures.  Severe pulmonary hypertensin. Continue aggressive diuresis.   3. Acute on chronic renal failure, last creatinine was 2.8 in November. He follows with Dr. Justin Mend.  4. Atrial fibrillation documented by ECG in November, no prior history of same. He is not aware of any palpitations and I am not certain about the duration. Very complex patient, at this point would not pursue oral anticoagulant until his clinical course and plan are better understood.  5. History of nonobstructive CAD based on prior  assessment, no obvious angina.  6. Obstructive sleep apnea, on CPAP but not clear about consistency.    Signed, Dorothy Spark MD, [facility] 04/15/2015

## 2015-04-15 NOTE — Patient Outreach (Signed)
[place] [facility]) Care Management  04/15/2015  [name] [facility] 1932/04/09 [id number]  [place] Management was consulted for [name] to assist with management of CHF and DM after an admission for acute on chronic HF in April.   [name] has not been adherent to the prescribed plan of care for self management of CHF or DM. He intermittently weighs himself but does not report weight gain. He checks cbg's very frequently, sometimes 6-7 times daily, but has difficulty verbalizing the relationship between his dietary intake and cbg's. [name] declined offers for [place] to help him see the dietician, certified diabetes educator for help with dietary education. [name] was seen briefly at the [facility] after his April admission but did not keep his subsequent appointments.  [name] had an office visit with Dr. Edrick Oh over the last few months who explained to [name] about the progression of his CKD and offered information about usual progression of disease and possible choices [name] may have to make over the course of the next year (Dialysis vs medical treatment which could eventually include hospice care).   [name] has had 2 hospital admissions in the lat 6 months, the lasting being November for renal insufficiency with discharge on 03/26/15 to [facility]. [name] stayed at [facility] for less than 24 hours and went home.   Since then, [name] has cancelled 2 appointments for home visits with [facility] Care Management.   [name] daughter and son have been very engaged in [name]'s care but have both expressed to me that he "follows orders only when he wants to." His family has remained very supportive and attentive. [name] son visits the home almost daily as does the grandson. [name] daughter is a Occupational psychologist at [employer] and manages his medications as well as [name]'s. She began taking medication  bottles from the home a few months ago and leaves only a one week supply of medications (including insulin) in the home.    [facility] Care Management will follow [name] progress and again attempt to engage at discharge if he wishes.    [place] Management  [phone]

## 2015-04-15 NOTE — Progress Notes (Signed)
Utilization Review Completed.[name] T12/18/2016  

## 2015-04-16 ENCOUNTER — Inpatient Hospital Stay (HOSPITAL_COMMUNITY): Payer: Medicare Other

## 2015-04-16 LAB — HEPATIC FUNCTION PANEL
ALT: 17 U/L (ref 17–63)
AST: 26 U/L (ref 15–41)
Albumin: 2.9 g/dL — ABNORMAL LOW (ref 3.5–5.0)
Alkaline Phosphatase: 64 U/L (ref 38–126)
BILIRUBIN DIRECT: 0.8 mg/dL — AB (ref 0.1–0.5)
Indirect Bilirubin: 1.5 mg/dL — ABNORMAL HIGH (ref 0.3–0.9)
Total Bilirubin: 2.3 mg/dL — ABNORMAL HIGH (ref 0.3–1.2)
Total Protein: 5.5 g/dL — ABNORMAL LOW (ref 6.5–8.1)

## 2015-04-16 LAB — BASIC METABOLIC PANEL
ANION GAP: 11 (ref 5–15)
BUN: 59 mg/dL — ABNORMAL HIGH (ref 6–20)
CALCIUM: 9 mg/dL (ref 8.9–10.3)
CO2: 28 mmol/L (ref 22–32)
Chloride: 100 mmol/L — ABNORMAL LOW (ref 101–111)
Creatinine, Ser: 2.55 mg/dL — ABNORMAL HIGH (ref 0.61–1.24)
GFR, EST AFRICAN AMERICAN: 26 mL/min — AB (ref >60)
GFR, EST NON AFRICAN AMERICAN: 22 mL/min — AB (ref >60)
GLUCOSE: 124 mg/dL — AB (ref 65–99)
Potassium: 3.6 mmol/L (ref 3.5–5.1)
SODIUM: 139 mmol/L (ref 135–145)

## 2015-04-16 LAB — BLOOD GAS, ARTERIAL
ACID-BASE EXCESS: 3.8 mmol/L — AB (ref 0.0–2.0)
BICARBONATE: 27.9 meq/L — AB (ref 20.0–24.0)
Drawn by: 275531
FIO2: 0.21
O2 SAT: 91.5 %
PATIENT TEMPERATURE: 98.6
TCO2: 29.2 mmol/L (ref 0–100)
pCO2 arterial: 43 mmHg (ref 35.0–45.0)
pH, Arterial: 7.428 (ref 7.350–7.450)
pO2, Arterial: 64.5 mmHg — ABNORMAL LOW (ref 80.0–100.0)

## 2015-04-16 LAB — GLUCOSE, CAPILLARY
GLUCOSE-CAPILLARY: 117 mg/dL — AB (ref 65–99)
Glucose-Capillary: 107 mg/dL — ABNORMAL HIGH (ref 65–99)
Glucose-Capillary: 124 mg/dL — ABNORMAL HIGH (ref 65–99)
Glucose-Capillary: 162 mg/dL — ABNORMAL HIGH (ref 65–99)

## 2015-04-16 LAB — TSH: TSH: 1.034 u[IU]/mL (ref 0.350–4.500)

## 2015-04-16 LAB — HEMOGLOBIN A1C
Hgb A1c MFr Bld: 5.9 % — ABNORMAL HIGH (ref 4.8–5.6)
MEAN PLASMA GLUCOSE: 123 mg/dL

## 2015-04-16 LAB — AMMONIA: Ammonia: 80 umol/L — ABNORMAL HIGH (ref 9–35)

## 2015-04-16 MED ORDER — LACTULOSE ENEMA
300.0000 mL | Freq: Two times a day (BID) | ORAL | Status: DC
  Filled 2015-04-16 (×2): qty 300

## 2015-04-16 MED ORDER — LACTULOSE 10 GM/15ML PO SOLN
20.0000 g | Freq: Two times a day (BID) | ORAL | Status: DC
  Administered 2015-04-16 – 2015-04-17 (×2): 20 g via ORAL
  Filled 2015-04-16 (×3): qty 30

## 2015-04-16 NOTE — Consult Note (Signed)
   [facility] CM Inpatient Consult   04/16/2015  JRU RANDEL Aug 03, 1931 [id number] Patient is currently active with [facility] for chronic disease management services.  Patient has been engaged by a [employer].  Our community nurse has had some difficulty connecting with the patient's at times.  Will follow up with the patient to check for ongoing commitment to post discharge follow up call and will be evaluated for monthly home visits for assessments and disease process education.  Will follow up Inpatient Case Manager aware that [place] Management following. Of note, [facility] Care Management services does not replace or interfere with any services that are needed or arranged by inpatient case management or social work.  For additional questions or referrals please contact: Natividad Brood, RN BSN [facility] Liaison  [phone] business mobile phone

## 2015-04-16 NOTE — Care Management Important Message (Signed)
Important Message  Patient Details  Name: [name] MRN: [id number] Date of Birth: 1931/10/21   Medicare Important Message Given:  Yes    [name] May 04/16/2015, 12:31 PM

## 2015-04-16 NOTE — Progress Notes (Signed)
43 Fr. Foley catheter placed for reasons of palliative care. Patient with multiple, open MASD sites on scrotum, in both groins and between folds on abdomen and buttocks. Aseptic technique observed during procedure. Discussed with charge RN, Sharee Pimple Tsoutis before insertion; Foley is warranted in this case.  Approximately 200 ml clear, yellow urine returned to bag by gravity on insertion. Patient tolerated procedure well. Unable to educate related to acute confusion/hallucinations.  Continuing to monitor.

## 2015-04-16 NOTE — Progress Notes (Addendum)
[facility] PROGRESS NOTE  [name] [id number] DOB: 1931-05-28 DOA: 04/13/2015 PCP: Wende Neighbors, MD  Assessment/Plan: 81 y.o. male with a history of Diastolic CHF, Stage III CKD, Atrial Fibrillation, IDDM, HTN. OSA, Lymphedema, chronci leg edema who presents to the ED with complaints of worsening SOB, and edema of both legs for quite some time. -He was seen by his Cardiologist Dr. Domenic Polite today and advised to go to the ED for hospitalization due to acute on chronic CHF.    Assessment and plan 1.Acute on chronic diastolic CHF. Previous echo. LEVF 40-45%. With evidence of volume overload or lymphedema -2-D echo shows EF of 60%, with grade 2 diatsolic dysfunction and elevated filling pressures. Severe pulmonary hypertensin. Continue aggressive diuresis.  cont aggressive diuresis per cardiology. cont BB, patient is not a good candidate for ACE/ARB due to renal issues. Consider hydralazine+NTG if BP tolerates   2.Atrial fibrillation. Not a good candidate for Anticoagulant Rx. Very complex patient, would not appear to be a good candidate for long-term anticoagulation because of his dementia and multiple comorbidities  3. IDDM. Hold Levemir due to borderline glucose. Hemoglobin A1c 5.9 . Cont SSI   4.OSA treated with BiPAP. BIPAP qhs and PRN  5. Morbid obesity-BMI 51. Needs Weight Loss  6. CKD stage IV. Lymphedema, chronci leg edema. Complicated by renal insufficiency, atrial fibrillation, anemia, continue Lasix 80 mg IV BID Baseline renal function 2.5-2.6, currently creatinine 2.58, continue diuresis  7.  Acute encephalopathy check ABG,TSH, ammonia level  Code Status: full Family Communication: d/w patient  (indicate person spoken with, relationship, and if by phone, the number) Disposition Plan: continue on IV diuresis , cardiology recommends palliative care consultation.Discussed with family about poor prognosis,made a DNI    Consultants:  Cardiology    Procedures:  Pend echo   Antibiotics:  none (indicate start date, and stop date if known)  HPI/Subjective: confused ,arousable to sternal rub ,  Objective: Filed Vitals:   04/16/15 0919 04/16/15 1130  BP: 133/75 127/64  Pulse: 76 70  Temp:    Resp: 21 15    Intake/Output Summary (Last 24 hours) at 04/16/15 1243 Last data filed at 04/16/15 1100  Gross per 24 hour  Intake    660 ml  Output   2451 ml  Net  -1791 ml   Filed Weights   04/14/15 1147 04/15/15 0500 04/16/15 0500  Weight: 176.449 kg (389 lb) 174.136 kg (383 lb 14.4 oz) 172.639 kg (380 lb 9.6 oz)    Exam: General: Pleasant, NAD. Demented Neuro: Marland Kitchen Moves all extremities spontaneously. Psych: Normal affect. HEENT: Normal Neck: Supple without bruits or JVD. Lungs: Resp regular and unlabored, poor inspiratory effort. Decreased breath sounds at bases. Heart: Irregularly irregular. no s3, s4, or murmurs. Abdomen: Soft, non-tender, non-distended, BS + x 4.  Extremities: Marked peripheral edema. Marked lymphedema of the left lower extremity.  Data Reviewed: Basic Metabolic Panel:  Recent Labs Lab 04/13/15 1636 04/14/15 0320 04/15/15 0340 04/16/15 0501  NA 140 141 140 139  K 4.5 4.2 3.7 3.6  CL 105 105 103 100*  CO2 24 26 26 28   GLUCOSE 120* 116* 107* 124*  BUN 55* 57* 57* 59*  CREATININE 2.64* 2.63* 2.58* 2.55*  CALCIUM 8.9 8.9 9.0 9.0   Liver Function Tests: No results for input(s): AST, ALT, ALKPHOS, BILITOT, PROT, ALBUMIN in the last 168 hours. No results for input(s): LIPASE, AMYLASE in the last 168 hours. No results for input(s): AMMONIA in the last 168 hours. CBC:  Recent Labs Lab 04/13/15 1636 04/15/15 0340  WBC 5.4 4.1  HGB 8.1* 8.1*  HCT 27.8* 27.0*  MCV 94.9 94.7  PLT 101* 84*   Cardiac Enzymes: No results for input(s): CKTOTAL, CKMB, CKMBINDEX, TROPONINI in the last 168 hours. BNP (last 3 results)  Recent Labs  08/16/14 1945 04/13/15 1646  BNP 869.7*  798.0*    ProBNP (last 3 results) No results for input(s): PROBNP in the last 8760 hours.  CBG:  Recent Labs Lab 04/15/15 1117 04/15/15 1700 04/15/15 2138 04/16/15 0747 04/16/15 1119  GLUCAP 132* 122* 140* 107* 124*    Recent Results (from the past 240 hour(s))  MRSA PCR Screening     Status: None   Collection Time: 04/13/15 10:31 PM  Result Value Ref Range Status   MRSA by PCR NEGATIVE NEGATIVE Final    Comment:        The GeneXpert MRSA Assay (FDA approved for NASAL specimens only), is one component of a comprehensive MRSA colonization surveillance program. It is not intended to diagnose MRSA infection nor to guide or monitor treatment for MRSA infections.      Studies: No results found.  Scheduled Meds: . calcium-vitamin D  1 tablet Oral BID  . furosemide  80 mg Intravenous BID  . heparin  5,000 Units Subcutaneous 3 times per day  . insulin aspart  0-5 Units Subcutaneous QHS  . insulin aspart  0-9 Units Subcutaneous TID WC  . labetalol  200 mg Oral BID  . latanoprost  1 drop Right Eye QHS  . loratadine  10 mg Oral Daily  . multivitamin with minerals  1 tablet Oral Daily  . sertraline  25 mg Oral QHS  . sodium chloride  3 mL Intravenous Q12H  . spironolactone  25 mg Oral Daily  . tamsulosin  0.4 mg Oral QHS  . vitamin B-12  1,000 mcg Oral Daily   Continuous Infusions:   Principal Problem:   Acute on chronic diastolic CHF (congestive heart failure) (HCC) Active Problems:   Morbid obesity-BMI 51   Essential hypertension, benign   CAD- mild non obstructive Oct 2006   CKD (chronic kidney disease) stage 3, GFR 30-59 ml/min   Chronic anemia   Thrombocytopenia (HCC)   IDDM (insulin dependent diabetes mellitus) ([place])   Atrial fibrillation (HCC)   OSA treated with BiPAP    Time spent: >35 minutes     [facility] Pager [phone]. If 7PM-7AM, please contact night-coverage at [email or web address], password [facility] 04/16/2015, 12:43 PM   LOS: 3 days

## 2015-04-16 NOTE — Progress Notes (Signed)
Placed patient on BIPAP auto settings ( min 4 max 20) via FFM.  Patient tolerating well at this time.

## 2015-04-16 NOTE — Clinical Social Work Placement (Signed)
   CLINICAL SOCIAL WORK PLACEMENT  NOTE  Date:  04/16/2015  Patient Details  Name: [name] MRN: [id number] Date of Birth: 1931/05/06  Clinical Social Work is seeking post-discharge placement for this patient at the [place] level of care (*CSW will initial, date and re-position this form in  chart as items are completed):  Yes   Patient/family provided with [place] Work Department's list of facilities offering this level of care within the geographic area requested by the patient (or if unable, by the patient's family).  No   Patient/family informed of their freedom to choose among providers that offer the needed level of care, that participate in Medicare, Medicaid or managed care program needed by the patient, have an available bed and are willing to accept the patient.  Yes   Patient/family informed of [facility]'s ownership interest in [facility] and [facility], as well as of the fact that they are under no obligation to receive care at these facilities.  PASRR submitted to EDS on       PASRR number received on       Existing PASRR number confirmed on 04/16/15     FL2 transmitted to all facilities in geographic area requested by pt/family on 04/16/15     FL2 transmitted to all facilities within larger geographic area on       Patient informed that his/her managed care company has contracts with or will negotiate with certain facilities, including the following:            Patient/family informed of bed offers received.  Patient chooses bed at       Physician recommends and patient chooses bed at      Patient to be transferred to   on  .  Patient to be transferred to facility by       Patient family notified on   of transfer.  Name of family member notified:        PHYSICIAN Please prepare priority discharge summary, including medications, Please prepare prescriptions, Please sign FL2     Additional Comment:     _______________________________________________ Liz Beach MSW, [place], [place], [id number]

## 2015-04-16 NOTE — Clinical Social Work Note (Signed)
Clinical Social Work Assessment  Patient Details  Name: [name] MRN: [id number] Date of Birth: Oct 01, 1931  Date of referral:  04/16/15               Reason for consult:  Discharge Planning, Facility Placement                Permission sought to share information with:  Facility Sport and exercise psychologist, Family Supports Permission granted to share information::  Yes, Verbal Permission Granted  Name::     Seth Bake and Teacher, early years/pre::  SNFs  Relationship::     Contact Information:     Housing/Transportation Living arrangements for the past 2 months:  Single Family Home Source of Information:  Patient Patient Interpreter Needed:  None Criminal Activity/Legal Involvement Pertinent to Current Situation/Hospitalization:  No - Comment as needed Significant Relationships:  Adult Children, Spouse Lives with:  Spouse Do you feel safe going back to the place where you live?  Yes Need for family participation in patient care:  Yes (Comment)  Care giving concerns:  Patient and patient's family agree that SNF placement is needed at discharge.   Social Worker assessment / plan:  CSW met with patient at bedside to complete assessment. Patient agreeable to go to SNF at discharge. He states that he has been to SNF many times in the past. He would like to be placed close to home but states that he does not want [place]. CSW made multiple attempts to reach the patient's wife and daughter to make aware of placement process, messages left. CSW explained SNF search/placement process and answered the patient's questions. CSW will followup with bed offers.  Employment status:  Retired Forensic scientist:  Medicare PT Recommendations:  [place] / Referral to community resources:  [place]  Patient/Family's Response to care:  Patient is happy with the care he has received at [facility].  Patient/Family's Understanding of and Emotional Response  to Diagnosis, Current Treatment, and Prognosis:  Patient appears to have good understanding of reason for admission and understands what his post DC needs will be.  Emotional Assessment Appearance:  Appears stated age Attitude/Demeanor/Rapport:  Other (Appropriate and welcoming of CSW) Affect (typically observed):  Accepting, Appropriate, Calm, Pleasant Orientation:  Oriented to Self, Oriented to Place, Oriented to  Time, Oriented to Situation Alcohol / Substance use:  Never Used Psych involvement (Current and /or in the community):  No (Comment)  Discharge Needs  Concerns to be addressed:  Discharge Planning Concerns Readmission within the last 30 days:  Yes Current discharge risk:  Chronically ill, Physical Impairment Barriers to Discharge:  Continued Medical Work up   [employer] MSW, [place], [place], [phone]

## 2015-04-16 NOTE — Progress Notes (Signed)
Pt had 5 bt run of vtach, VSS, pt asleep and asymptomatic at the time.  Dr. Allyson Sabal notified. Will continue to closely monitor.

## 2015-04-16 NOTE — [place] FL2 (Signed)
[place] MEDICAID FL2 LEVEL OF CARE SCREENING TOOL     IDENTIFICATION  Patient Name: [name] Birthdate: [birth year] Sex: male Admission Date (Current Location): 04/13/2015  [facility] and [place] Number: [employer] and Address:  [facility] [place] [place] [address], [place], [place] [address]      Provider Number: [id number]  Attending Physician Name and Address:  Reyne Dumas, MD  Relative Name and Phone Number:  [name], [name], JK:[phone]    Current Level of Care: Hospital Recommended Level of Care: [place] Prior Approval Number:    Date Approved/Denied:   PASRR Number: [id number]  Discharge Plan: SNF    Current Diagnoses: Patient Active Problem List   Diagnosis Date Noted  . IDDM (insulin dependent diabetes mellitus) ([place]) 04/13/2015  . Atrial fibrillation ([place]) 04/13/2015  . OSA treated with BiPAP 04/13/2015  . SOB (shortness of breath)   . AKI (acute kidney injury) ([place])   . Acute on chronic renal insufficiency ([place]) 03/23/2015  . Chronic anemia 03/23/2015  . Guaiac positive stools 03/23/2015  . Thrombocytopenia ([place]) 03/23/2015  . Renal failure (ARF), acute on chronic (HCC) 03/23/2015  . Pressure ulcer, stage 2, left posterior thigh at crease of butt 08/20/2014  . Sleep apnea- on C-pap 08/19/2014  . Acute on chronic diastolic CHF (congestive heart failure), NYHA class 1 ([place]) 08/17/2014  . Acute on chronic diastolic CHF (congestive heart failure) ([place]) 08/16/2014  . Blindness of left eye 04/15/2013  . CKD (chronic kidney disease) stage 3, GFR 30-59 ml/min 04/14/2013  . Chronic pain syndrome 08/18/2012  . Chronic diastolic heart failure ([place]) 07/15/2012  . Acute on chronic diastolic heart failure ([place]) 10/08/2010  . Morbid obesity-BMI 51 05/30/2009  . Essential hypertension, benign 05/30/2009  . CAD- mild non obstructive Oct 2006 02/03/2009  . SVT/ PSVT/ PAT 02/03/2009  . Mixed hyperlipidemia  12/26/2008    Orientation RESPIRATION BLADDER Height & Weight    Self, Time, Situation, Place  Normal (Did require bi-pap night of 12/19, unclear if this is needed at time of DC.) Continent 6\' 1"  (185.4 cm) 380 lbs.  BEHAVIORAL SYMPTOMS/MOOD NEUROLOGICAL BOWEL NUTRITION STATUS   (NONE)   Continent Diet (Carb Modified)  AMBULATORY STATUS COMMUNICATION OF NEEDS Skin   Extensive Assist Verbally Normal                       Personal Care Assistance Level of Assistance  Bathing, Dressing, Feeding Bathing Assistance: Maximum assistance Feeding assistance: Maximum assistance Dressing Assistance: Maximum assistance     Functional Limitations Info  Sight, Hearing, Speech Sight Info: Impaired Hearing Info: Impaired Speech Info: Adequate    SPECIAL CARE FACTORS FREQUENCY  PT (By licensed PT), OT (By licensed OT)     PT Frequency: 5X/week OT Frequency: 5X/week            Contractures Contractures Info: Not present    Additional Factors Info  Code Status, Allergies, Psychotropic, Insulin Sliding Scale Code Status Info: Full Allergies Info: Lyrica, sulfa antibiotics, sulfonamide derivatives, ambien, penicillins, piroxicam, sulfamethoxazole, zolpidem Psychotropic Info: Zoloft Insulin Sliding Scale Info: Average 4x/day       Current Medications (04/16/2015):  This is the current hospital active medication list Current Facility-Administered Medications  Medication Dose Route Frequency Provider Last Rate Last Dose  . 0.9 %  sodium chloride infusion  250 mL Intravenous PRN Theressa Millard, MD      . acetaminophen (TYLENOL) tablet 650 mg  650  mg Oral Q4H PRN Theressa Millard, MD   650 mg at 04/14/15 2124  . calcium-vitamin D (OSCAL WITH D) 500-200 MG-UNIT per tablet 1 tablet  1 tablet Oral BID Theressa Millard, MD   1 tablet at 04/15/15 2221  . furosemide (LASIX) injection 80 mg  80 mg Intravenous BID Dorothy Spark, MD   80 mg at 04/15/15 1722  . heparin injection  5,000 Units  5,000 Units Subcutaneous 3 times per day Theressa Millard, MD   5,000 Units at 04/16/15 0542  . insulin aspart (novoLOG) injection 0-5 Units  0-5 Units Subcutaneous QHS Theressa Millard, MD   0 Units at 04/13/15 2233  . insulin aspart (novoLOG) injection 0-9 Units  0-9 Units Subcutaneous TID WC Theressa Millard, MD   1 Units at 04/15/15 1723  . labetalol (NORMODYNE) tablet 200 mg  200 mg Oral BID Theressa Millard, MD   200 mg at 04/15/15 2222  . latanoprost (XALATAN) 0.005 % ophthalmic solution 1 drop  1 drop Right Eye QHS Theressa Millard, MD   1 drop at 04/15/15 2223  . loratadine (CLARITIN) tablet 10 mg  10 mg Oral Daily Theressa Millard, MD   10 mg at 04/15/15 0914  . multivitamin with minerals tablet 1 tablet  1 tablet Oral Daily Theressa Millard, MD   1 tablet at 04/15/15 0915  . ondansetron (ZOFRAN) injection 4 mg  4 mg Intravenous Q6H PRN Theressa Millard, MD      . sertraline (ZOLOFT) tablet 25 mg  25 mg Oral QHS Theressa Millard, MD   25 mg at 04/15/15 2222  . sodium chloride 0.9 % injection 3 mL  3 mL Intravenous Q12H Theressa Millard, MD   3 mL at 04/15/15 2200  . sodium chloride 0.9 % injection 3 mL  3 mL Intravenous PRN Theressa Millard, MD      . spironolactone (ALDACTONE) tablet 25 mg  25 mg Oral Daily Theressa Millard, MD   25 mg at 04/15/15 0915  . tamsulosin (FLOMAX) capsule 0.4 mg  0.4 mg Oral QHS Theressa Millard, MD   0.4 mg at 04/15/15 2221  . vitamin B-12 (CYANOCOBALAMIN) tablet 1,000 mcg  1,000 mcg Oral Daily Theressa Millard, MD   1,000 mcg at 04/15/15 [id number]     Discharge Medications: Please see discharge summary for a list of discharge medications.  Relevant Imaging Results:  Relevant Lab Results:   Additional Information  CPAP at night  Rigoberto Noel, [place]

## 2015-04-16 NOTE — Progress Notes (Signed)
Pt appears to be more confused and drowsy, VSS.  Dr. Allyson Sabal notified and orders received to obtain stat ABG.  Will continue to closely monitor.

## 2015-04-16 NOTE — Progress Notes (Signed)
Patient Name: [name] Date of Encounter: 04/16/2015     Principal Problem:   Acute on chronic diastolic CHF (congestive heart failure) ([place]) Active Problems:   Morbid obesity-BMI 76   Essential hypertension, benign   CAD- mild non obstructive Oct 2006   CKD (chronic kidney disease) stage 3, GFR 30-59 ml/min   Chronic anemia   Thrombocytopenia (HCC)   IDDM (insulin dependent diabetes mellitus) ([place])   Atrial fibrillation (HCC)   OSA treated with BiPAP    SUBJECTIVE  The patient denies any chest discomfort.  He states that he stays short of breath.  His dyspnea is no worse. Telemetry shows atrial fibrillation with controlled ventricular response.  CURRENT MEDS . calcium-vitamin D  1 tablet Oral BID  . furosemide  80 mg Intravenous BID  . heparin  5,000 Units Subcutaneous 3 times per day  . insulin aspart  0-5 Units Subcutaneous QHS  . insulin aspart  0-9 Units Subcutaneous TID WC  . labetalol  200 mg Oral BID  . latanoprost  1 drop Right Eye QHS  . loratadine  10 mg Oral Daily  . multivitamin with minerals  1 tablet Oral Daily  . sertraline  25 mg Oral QHS  . sodium chloride  3 mL Intravenous Q12H  . spironolactone  25 mg Oral Daily  . tamsulosin  0.4 mg Oral QHS  . vitamin B-12  1,000 mcg Oral Daily    OBJECTIVE  Filed Vitals:   04/15/15 2100 04/15/15 2222 04/16/15 0047 04/16/15 0500  BP: 128/76 130/70  114/40  Pulse: 82 80 69 79  Temp: 98.9 F (37.2 C)   97.9 F (36.6 C)  TempSrc:      Resp: 21  20 21   Height:      Weight:    380 lb 9.6 oz (172.639 kg)  SpO2: 90%  94% 90%    Intake/Output Summary (Last 24 hours) at 04/16/15 0833 Last data filed at 04/16/15 0500  Gross per 24 hour  Intake    900 ml  Output   2400 ml  Net  -1500 ml   Filed Weights   04/14/15 1147 04/15/15 0500 04/16/15 0500  Weight: 389 lb (176.449 kg) 383 lb 14.4 oz (174.136 kg) 380 lb 9.6 oz (172.639 kg)    PHYSICAL EXAM  General: Pleasant, NAD.  Demented Neuro: Marland Kitchen  Moves all extremities spontaneously. Psych: Normal affect. HEENT:  Normal  Neck: Supple without bruits or JVD. Lungs:  Resp regular and unlabored, poor inspiratory effort.  Decreased breath sounds at bases. Heart: Irregularly irregular. no s3, s4, or murmurs. Abdomen: Soft, non-tender, non-distended, BS + x 4.  Extremities: Marked peripheral edema.  Marked lymphedema of the left lower extremity.  Accessory Clinical Findings  CBC  Recent Labs  04/13/15 1636 04/15/15 0340  WBC 5.4 4.1  HGB 8.1* 8.1*  HCT 27.8* 27.0*  MCV 94.9 94.7  PLT 101* 84*   Basic Metabolic Panel  Recent Labs  04/15/15 0340 04/16/15 0501  NA 140 139  K 3.7 3.6  CL 103 100*  CO2 26 28  GLUCOSE 107* 124*  BUN 57* 59*  CREATININE 2.58* 2.55*  CALCIUM 9.0 9.0   Liver Function Tests No results for input(s): AST, ALT, ALKPHOS, BILITOT, PROT, ALBUMIN in the last 72 hours. No results for input(s): LIPASE, AMYLASE in the last 72 hours. Cardiac Enzymes No results for input(s): CKTOTAL, CKMB, CKMBINDEX, TROPONINI in the last 72 hours. BNP Invalid input(s): POCBNP D-Dimer No results for input(s):  DDIMER in the last 72 hours. Hemoglobin A1C No results for input(s): HGBA1C in the last 72 hours. Fasting Lipid Panel No results for input(s): CHOL, HDL, LDLCALC, TRIG, CHOLHDL, LDLDIRECT in the last 72 hours. Thyroid Function Tests No results for input(s): TSH, T4TOTAL, T3FREE, THYROIDAB in the last 72 hours.  Invalid input(s): FREET3  TELE  Atrial fibrillation with controlled ventricular response in the 80s.  ECG    Radiology/Studies  Dg Pelvis 1-2 Views  03/23/2015  CLINICAL DATA:  Fall.  Right hip pain. EXAM: PELVIS - 1-2 VIEW COMPARISON:  None. FINDINGS: There is no evidence of pelvic fracture or diastasis. No pelvic bone lesions are seen. Minimal hypertrophic changes present at the superior acetabular margins in both hip joints. IMPRESSION: No pelvic fracture. Minimal osteoarthritis in the  weight-bearing portions of both hip joints. Electronically Signed   By: Ilona Sorrel M.D.   On: 03/23/2015 08:17   Ct Head Wo Contrast  03/22/2015  CLINICAL DATA:  Trip and fall injury, subsequent patient fell from stretcher onto the left side. Now with head and neck pain. Examination is limited as the patient had difficulty fitting into the gantry and was short of breath while laying flat. EXAM: CT HEAD WITHOUT CONTRAST CT CERVICAL SPINE WITHOUT CONTRAST TECHNIQUE: Multidetector CT imaging of the head and cervical spine was performed following the standard protocol without intravenous contrast. Multiplanar CT image reconstructions of the cervical spine were also generated. COMPARISON:  CT head 12/24/2014 FINDINGS: CT HEAD FINDINGS Diffuse cerebral atrophy. Mild ventricular dilatation consistent with central atrophy. Low-attenuation changes in the deep white matter consistent with small vessel ischemia. Basal ganglia calcifications. No mass effect or midline shift. No abnormal extra-axial fluid collections. Gray-white matter junctions are distinct. Basal cisterns are not effaced. No evidence of acute intracranial hemorrhage. No depressed skull fractures. Opacification of left maxillary antrum and some of the bilateral ethmoid air cells. Opacification of some of the mastoid air cells bilaterally. Calcification and deformity of the left globe. Vascular calcifications. CT CERVICAL SPINE FINDINGS Cervical spine examination is significantly limited due to motion artifact. There is mild anterior subluxation of C3 on C4. This is likely degenerative but ligamentous injury is not excluded. Diffuse degenerative change throughout the cervical spine with narrowed cervical interspaces and endplate hypertrophic changes. Degenerative changes throughout the facet joints. Bone encroachment upon neural foramina bilaterally at multiple levels. No prevertebral soft tissue swelling. No vertebral compression deformities. C1-2  articulation appears intact. IMPRESSION: No acute intracranial abnormalities. Chronic atrophy and small vessel ischemic changes. Opacification of some of the paranasal sinuses. Examination of cervical spine is significant limited due to motion artifact. There is no gross displaced fracture identified. However, there is anterior subluxation of C3 on C4. This is likely degenerative but ligamentous injury is not excluded. Diffuse degenerative changes throughout cervical spine. Electronically Signed   By: Lucienne Capers M.D.   On: 03/22/2015 21:13   Ct Cervical Spine Wo Contrast  03/22/2015  CLINICAL DATA:  Trip and fall injury, subsequent patient fell from stretcher onto the left side. Now with head and neck pain. Examination is limited as the patient had difficulty fitting into the gantry and was short of breath while laying flat. EXAM: CT HEAD WITHOUT CONTRAST CT CERVICAL SPINE WITHOUT CONTRAST TECHNIQUE: Multidetector CT imaging of the head and cervical spine was performed following the standard protocol without intravenous contrast. Multiplanar CT image reconstructions of the cervical spine were also generated. COMPARISON:  CT head 12/24/2014 FINDINGS: CT HEAD FINDINGS Diffuse cerebral atrophy.  Mild ventricular dilatation consistent with central atrophy. Low-attenuation changes in the deep white matter consistent with small vessel ischemia. Basal ganglia calcifications. No mass effect or midline shift. No abnormal extra-axial fluid collections. Gray-white matter junctions are distinct. Basal cisterns are not effaced. No evidence of acute intracranial hemorrhage. No depressed skull fractures. Opacification of left maxillary antrum and some of the bilateral ethmoid air cells. Opacification of some of the mastoid air cells bilaterally. Calcification and deformity of the left globe. Vascular calcifications. CT CERVICAL SPINE FINDINGS Cervical spine examination is significantly limited due to motion artifact.  There is mild anterior subluxation of C3 on C4. This is likely degenerative but ligamentous injury is not excluded. Diffuse degenerative change throughout the cervical spine with narrowed cervical interspaces and endplate hypertrophic changes. Degenerative changes throughout the facet joints. Bone encroachment upon neural foramina bilaterally at multiple levels. No prevertebral soft tissue swelling. No vertebral compression deformities. C1-2 articulation appears intact. IMPRESSION: No acute intracranial abnormalities. Chronic atrophy and small vessel ischemic changes. Opacification of some of the paranasal sinuses. Examination of cervical spine is significant limited due to motion artifact. There is no gross displaced fracture identified. However, there is anterior subluxation of C3 on C4. This is likely degenerative but ligamentous injury is not excluded. Diffuse degenerative changes throughout cervical spine. Electronically Signed   By: Lucienne Capers M.D.   On: 03/22/2015 21:13   Mr Cervical Spine Wo Contrast  03/23/2015  CLINICAL DATA:  Status post trip and fall 03/22/2015. Increasing diffuse weakness. Question ligamentous injury at C3-4. Initial encounter. EXAM: MRI CERVICAL SPINE WITHOUT CONTRAST TECHNIQUE: Multiplanar, multisequence MR imaging of the cervical spine was performed. No intravenous contrast was administered. COMPARISON:  CT cervical spine 03/22/2015. FINDINGS: The study is degraded by patient motion. Vertebral body height and signal are maintained. Trace anterolisthesis C3 on C4 is due to facet arthropathy. No evidence of ligamentous injury is seen. The craniocervical junction is normal and cervical cord signal is normal. Imaged paraspinous structures are unremarkable. Mucosal thickening is noted in the right maxillary sinus. C2-3: Facet degenerative disease is seen. The central canal and foramina appear open. C3-4: Facet arthropathy is seen. The disc is uncovered without bulging. The  central canal is open. There is likely moderate bilateral foraminal narrowing although evaluation is degraded by motion. C4-5: Mild posterior bony ridging and uncovertebral disease. The central canal is open. There appears to be bilateral foraminal narrowing. C5-6: Small disc osteophyte complex, facet arthropathy and uncovertebral disease are seen. The ventral thecal sac is nearly effaced. The foramina are narrowed. C6-7: Small disc osteophyte complex effaces the ventral thecal sac. Uncovertebral disease appears worse on the left there is left worse than right foraminal narrowing. C7-T1: Minimal disc bulge without central canal or foraminal stenosis. IMPRESSION: Trace anterolisthesis C3 on C4 seen on comparison CT scan is due to facet degenerative disease. No evidence of posttraumatic change is present on this examination. Mild multilevel disc bulging as described above most notable at C5-6 and C6-7 where the ventral thecal sac is effaced. There appears to be multilevel foraminal narrowing. Evaluation is limited by patient motion. Electronically Signed   By: Inge Rise M.D.   On: 03/23/2015 13:07   US Renal  03/24/2015  CLINICAL DATA:  Acute renal insufficiency EXAM: RENAL / URINARY TRACT ULTRASOUND COMPLETE COMPARISON:  None. FINDINGS: Right Kidney: Length: 14 cm in length. There is cortical thinning probable due to atrophy. No hydronephrosis. No renal calculus. Left Kidney: Not identified due to patient's large body habitus.  Bladder: There is a Foley catheter within decompressed urinary bladder. IMPRESSION: No right hydronephrosis. Cortical thinning probable due to atrophy. There left kidney is not identified. Foley catheter within decompressed urinary bladder. Electronically Signed   By: Lahoma Crocker M.D.   On: 03/24/2015 18:34   Dg Chest Port 1 View  04/13/2015  CLINICAL DATA:  81 year old male with acute shortness of breath. EXAM: PORTABLE CHEST 1 VIEW COMPARISON:  03/23/2015 and prior chest  radiographs FINDINGS: Cardiomegaly and mild pulmonary vascular congestion noted. Bibasilar opacities likely represent atelectasis. There is no evidence of pneumothorax. No acute bony abnormalities are identified. IMPRESSION: Cardiomegaly, mild pulmonary vascular congestion and probable bibasilar atelectasis. Electronically Signed   By: Margarette Canada M.D.   On: 04/13/2015 17:41   Dg Chest Port 1 View  03/23/2015  CLINICAL DATA:  Shortness of breath, acute renal failure. EXAM: PORTABLE CHEST 1 VIEW COMPARISON:  December 24, 2014. FINDINGS: Stable cardiomegaly is noted. No pneumothorax or significant pleural effusion is noted. Mild diffuse interstitial densities are noted throughout both lungs, right greater than left, concerning for mild pulmonary edema. Stable mild central pulmonary vascular congestion is noted. Bony thorax is unremarkable. IMPRESSION: Stable cardiomegaly and central pulmonary vascular congestion is noted, with mild diffuse interstitial densities throughout both lungs concerning for pulmonary edema. Electronically Signed   By: Marijo Conception, M.D.   On: 03/23/2015 07:46   Dg Knee Complete 4 Views Right  03/22/2015  CLINICAL DATA:  Initial evaluation for acute trauma, fall. EXAM: RIGHT KNEE - COMPLETE 4+ VIEW COMPARISON:  None. FINDINGS: Diffuse osteopenia present, somewhat limiting evaluation for subtle nondisplaced fractures. No acute fracture or dislocation identified. No joint effusion. Severe tricompartmental degenerative osteoarthrosis. Prepatellar soft tissue swelling. IMPRESSION: 1. No acute fracture or dislocation. 2. Prepatellar soft tissue swelling. 3. Severe tricompartmental degenerative osteoarthrosis. 4. Diffuse osteopenia. Electronically Signed   By: Jeannine Boga M.D.   On: 03/22/2015 21:15   Dg Hand Complete Left  03/22/2015  CLINICAL DATA:  Status post fall at home while walking. Left hand swelling. Initial encounter. EXAM: LEFT HAND - COMPLETE 3+ VIEW COMPARISON:   None. FINDINGS: There is no evidence of fracture or dislocation. Joint space narrowing is noted at the first carpometacarpal joint, and there is mild diffuse osteopenia of visualized osseous structures. The carpal rows are intact, and demonstrate normal alignment. Diffuse soft tissue swelling is noted about the wrist. IMPRESSION: No evidence of fracture or dislocation. Electronically Signed   By: Garald Balding M.D.   On: 03/22/2015 21:17   Dg Hand Complete Right  03/22/2015  CLINICAL DATA:  Patient fell at home while walking. Swelling in both hands. EXAM: RIGHT HAND - COMPLETE 3+ VIEW COMPARISON:  None. FINDINGS: Degenerative changes throughout the joints of the right hand and wrist. No evidence of acute fracture or subluxation. No focal bone lesion or bone destruction. Bone cortex and trabecular architecture appear intact. No radiopaque soft tissue foreign bodies. IMPRESSION: Prominent diffuse degenerative changes throughout the right hand and wrist. No acute fractures identified. Electronically Signed   By: Lucienne Capers M.D.   On: 03/22/2015 21:16    ASSESSMENT AND PLAN  1. Evidence of marked fluid overload and lymphedema with history of diastolic heart failure. This is complicated by renal insufficiency, anemia, and  atrial fibrillation.  The patient is in anasarca, SOB at rest with desaturation down to 80' with minimal manipulations in bed. His Crea is 2.3, stable with IV diuresis  we started furosemide 80 mg iv BID yesterday  with g A consult to nephrology should be placed. Considering polymorbidity, age, dementia I would strongly recommend to consult palliative care.  2. Acute on chronic diastolic heart failure, last LVEF 45-50% in April. Now 60%, with grade 2 diatsolic dysfunction and elevated filling pressures. Severe pulmonary hypertensin. Continue aggressive diuresis.   3. Acute on chronic renal failure, last creatinine was 2.8 in November. He follows with Dr. Justin Mend.  4. Atrial  fibrillation documented by ECG in November, no prior history of same. He is not aware of any palpitations and I am not certain about the duration. Very complex patient.  At this point he would not appear to be a good candidate for long-term anticoagulation because of his dementia and multiple comorbidities  5. History of nonobstructive CAD based on prior assessment, no obvious angina.  6. Obstructive sleep apnea, on CPAP but not clear about consistency.  Plan: Continue IV diuresis.  Consider palliative care consult.  Signed, Warren Danes MD

## 2015-04-17 ENCOUNTER — Inpatient Hospital Stay (HOSPITAL_COMMUNITY): Payer: Medicare Other

## 2015-04-17 DIAGNOSIS — Z515 Encounter for palliative care: Secondary | ICD-10-CM

## 2015-04-17 LAB — GLUCOSE, CAPILLARY
GLUCOSE-CAPILLARY: 127 mg/dL — AB (ref 65–99)
Glucose-Capillary: 157 mg/dL — ABNORMAL HIGH (ref 65–99)
Glucose-Capillary: 155 mg/dL — ABNORMAL HIGH (ref 65–99)
Glucose-Capillary: 150 mg/dL — ABNORMAL HIGH (ref 65–99)

## 2015-04-17 LAB — CBC
HEMATOCRIT: 29.1 % — AB (ref 39.0–52.0)
HEMOGLOBIN: 8.6 g/dL — AB (ref 13.0–17.0)
MCH: 28.3 pg (ref 26.0–34.0)
MCHC: 29.6 g/dL — AB (ref 30.0–36.0)
MCV: 95.7 fL (ref 78.0–100.0)
Platelets: 67 10*3/uL — ABNORMAL LOW (ref 150–400)
RBC: 3.04 MIL/uL — ABNORMAL LOW (ref 4.22–5.81)
RDW: 23.6 % — ABNORMAL HIGH (ref 11.5–15.5)
WBC: 4.9 10*3/uL (ref 4.0–10.5)

## 2015-04-17 LAB — COMPREHENSIVE METABOLIC PANEL
ALK PHOS: 67 U/L (ref 38–126)
ALT: 17 U/L (ref 17–63)
ANION GAP: 9 (ref 5–15)
AST: 25 U/L (ref 15–41)
Albumin: 2.9 g/dL — ABNORMAL LOW (ref 3.5–5.0)
BUN: 60 mg/dL — ABNORMAL HIGH (ref 6–20)
CALCIUM: 9.4 mg/dL (ref 8.9–10.3)
CHLORIDE: 101 mmol/L (ref 101–111)
CO2: 30 mmol/L (ref 22–32)
CREATININE: 2.43 mg/dL — AB (ref 0.61–1.24)
GFR calc Af Amer: 27 mL/min — ABNORMAL LOW (ref >60)
GFR calc non Af Amer: 23 mL/min — ABNORMAL LOW (ref >60)
GLUCOSE: 120 mg/dL — AB (ref 65–99)
Potassium: 3.3 mmol/L — ABNORMAL LOW (ref 3.5–5.1)
Sodium: 140 mmol/L (ref 135–145)
Total Bilirubin: 3.1 mg/dL — ABNORMAL HIGH (ref 0.3–1.2)
Total Protein: 5.3 g/dL — ABNORMAL LOW (ref 6.5–8.1)

## 2015-04-17 LAB — AMMONIA: AMMONIA: 98 umol/L — AB (ref 9–35)

## 2015-04-17 MED ORDER — LACTULOSE ENEMA
300.0000 mL | Freq: Once | ORAL | Status: AC
  Administered 2015-04-18: 300 mL via RECTAL
  Filled 2015-04-17: qty 300

## 2015-04-17 NOTE — Progress Notes (Signed)
Patient has been lethargic with altered mental status since approximately 20:20. Lethargy is more profound than last evening. Patient is arouseable, but drifts-off readily and frequently calls-out for "mama." Periodic hypoxia during brief apneic episodes that last approximately 15 seconds.  Irregular rhythm on telemetry:  1st degree AV block with bundle branch block and frequent multifocal PVCs, sometimes in couplets, bigeminy, trigeminy and short runs of ventricular tachycardia (no more than 4 beats).  Patient was started on CPAP with O2 bled-in at 2 L/M at time of assessment. Unable to keep on due to acute confusion with mild combativeness.  Last vital signs: Filed Vitals:   04/17/15 0804 04/17/15 1628 04/17/15 1926 04/17/15 2027  BP: 136/60 117/43  118/55  Pulse: 34 66  67  Temp: 98.3 F (36.8 C) 98 F (36.7 C) 98.4 F (36.9 C)   TempSrc: Oral Oral Oral   Resp: 25 20  17   Height:      Weight:      SpO2: 96% 96%  97%    Patient is currently unsafe to take oral medications. Provided oral care at 20:20 and found pocketed food in patient's mouth.  Ammonia level was 98 earlier today, up from 80s yesterday. Oral lactulose is ordered BID; currently unable to give due to patient condition.  Patient also has MASD with skin loss in both groins, in intergluteal fold, on both buttocks, under abdominal folds and scrotum. Foley catheter was inserted yesterday evening for this reason and for end-of-life care. Would also benefit from Anderson bed.  NP on-call for [facility] paged to make aware and to request orders.  Continuing to monitor.

## 2015-04-17 NOTE — Plan of Care (Signed)
Problem: Activity: Goal: Capacity to carry out activities will improve Outcome: Adequate for Discharge Patient has met goals for discharge to skilled nursing facility. Unable to educate this patient related to acute hepatic encephalopathy with hallucinations and intermittent confusion/disorientation. Code status was changed today to partial code, no intubation. Palliative care consultation pending.  When briefly awake and coherent, patient is complaining of pain in his left leg (chronic). He generally does not stay awake and alert for long enough to safely take medications orally; tonight's oral medications were given late due to this.  Earlier this evening, patient was having frequent apneic episodes with SPO2 dropping to 60% range momentarily, then returning to 95 - 98% on O2 per nasal canula at 2 L/m. Also experiencing bradyarrhythmias with frequent bigeminal and trigeminal, unifocal PVCs on telemetry (known issue). Currently sustaining sinus rhythm with rates 70 -80, bundle branch block and frequent unifocal PVCs.  Placed patient on CPAP and elevated HOB to 60 degrees to promote oxygenation. As stated before, patient with altered mental status, hallucinations and intermittent confusion/disorientation. Rounding frequently to ensure compliance with CPAP.  Foley catheter placed per MD order for palliative concerns related to multiple MASD sites; see previous note.  Family updated on plan of care by telephone; new contact information received. Family will need education on discharge if possible.  Continuing to monitor closely.

## 2015-04-17 NOTE — Consult Note (Signed)
Consultation Note Date: 04/17/2015   Patient Name: [name]  DOB: 10-21-1931  MRN: [id number]  Age / Sex: 81 y.o., male  PCP: Celene Squibb, MD Referring Physician: Reyne Dumas, MD  Reason for Consultation: Establishing goals of care   Clinical Assessment/Narrative:    81 y.o. male with a history of Diastolic CHF, Stage III CKD, Atrial Fibrillation, IDDM, HTN. OSA, Lymphedema, chronic leg edema who presents to the ED with complaints of worsening SOB, and edema of both legs for quite some time.  He was seen by his Cardiologist Dr. Domenic Polite today and advised to go to the ED for hospitalization due to acute on chronic CHF. hospital course also complicated by acute on chronic renal failure and atrial fibrillation and acute encephalopathy. Patient is noted to have some history of cognitive difficulties particularly since his hospitalization in Thanksgiving this year because of a fall.  Palliative care consult her for CODE STATUS and goals of care discussions  Patient acutely confused and with diffuse edema. Patient able to state his name but otherwise is not able to verbalize or participate in meaningful conversations. Call placed to patient's wife Gay Filler. After not being able to reach her, call placed to son Quentin Angst. Obtained a #4 daughter Hassan Rowan [phone]. Hassan Rowan arrived at the hospital this afternoon. Patient went for CT scan of the brain this afternoon. He underwent an MRI earlier today which was with a lot of motion artifact. CT scan of the brain results show a trophy but no acute changes.  Family meeting held with the patient's wife, daughter Hassan Rowan, granddaughter and son-in-law outside the patient's room. Patient's wife states that the patient has had a gradual progressive decline since fall and hospitalization around Thanksgiving time. He has had altered mental status off and on since then. He has had a decline in  functional status as well. The patient has often talked about dying. He has stated that he would not want to die alone. The patient has been married to his wife for 65 years. They live in [place], [place]. The patient's wife is his primary caregiver. They do not have any home health care at home currently.  Patient's current acute on chronic medical conditions discussed in great detail. These include acute on chronic congestive heart failure diastolic dysfunction, acute on chronic renal failure. Discussed patient's ongoing confusion. CODE STATUS discussions undertaken in great detail. All questions answered. The patient's wife states that she would not want to prolong his life by artificial means if he was dying. DO NOT RESUSCITATE/DO NOT INTUBATE is going to be established  Brief life review performed. 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 He was active in his church. The patient's son died of congestive heart failure complications recently. He was at hosp[facility]. Family notes that the patient has made his wishes clear that he would not want to die at hospice of [facility]. They want to avoid morphine as much as possible. The want to avoid hospice completely. Goals are to continue with current scope of treatment-diureses, monitoring renal function at least for the next 24-48 hours. If no improvement, consider complete comfort measures and transfer to [place]. Family states they are okay with a hospital death if the patient does not improve. They do not want hospice. They want to be called if his condition changes abruptly anytime of the day or night. If he has an acute symptom burden, then they would be okay  with some low-dose opioids but not morphine. Discussed extensively about appropriate symptom management.  Palliative will continue to follow and assist with symptom management and help guide appropriate disposition  planning. Thank you for the consult.     Contacts/Participants in Discussion: Primary Decision Maker:     Relationship to Patient   HCPOA: yes   wife  SUMMARY OF RECOMMENDATIONS: Code discussions: DNR DNI Goals of care: continue current scope of treatment: attempts at diuresis, monitor renal function. If no clinical improvement in 24-48 hours, family is agreeable to complete comfort care. At that point, patient may be transferred to [facility]. Family does not want hospice involved, does not want to send the patient to hospice home.  Palliative will continue to follow along and help guide decision making.    Code Status/Advance Care Planning: DNR    Code Status Orders        Start     Ordered   04/17/15 [id number]  Do not attempt resuscitation (DNR)   Continuous    Question Answer Comment  In the event of cardiac or respiratory ARREST Do not call a "code blue"   In the event of cardiac or respiratory ARREST Do not perform Intubation, CPR, defibrillation or ACLS   In the event of cardiac or respiratory ARREST Use medication by any route, position, wound care, and other measures to relive pain and suffering. May use oxygen, suction and manual treatment of airway obstruction as needed for comfort.      04/17/15 1632      Other Directives:Other  Symptom Management:    as above  Palliative Prophylaxis:   Delirium Protocol  Additional Recommendations (Limitations, Scope, Preferences):   continue current treatment for now: attempts at diuresis, monitor renal function.      Psycho-social/Spiritual:  Support System: Strong Desire for further Chaplaincy support:no Additional Recommendations: Caregiving  Support/Resources  Prognosis: ?days to weeks at this point.   Discharge Planning: pending hospital course, family does not want hospice   Chief Complaint/ Primary Diagnoses: Present on Admission:  . Acute on chronic diastolic CHF (congestive heart failure) ([facility]) . CKD (chronic  kidney disease) stage 3, GFR 30-59 ml/min . Thrombocytopenia ([place]) . Essential hypertension, benign . Chronic anemia . CAD- mild non obstructive Oct 2006 . Morbid obesity-BMI 51 . Atrial fibrillation ([place]) . OSA treated with BiPAP  I have reviewed the medical record, interviewed the patient and family, and examined the patient. The following aspects are pertinent.  Past Medical History  Diagnosis Date  . PSVT (paroxysmal supraventricular tachycardia) (HCC)     AVNRT  . Coronary atherosclerosis of native coronary artery     Nonobstructive, LVEF 65%  . Hyperlipidemia   . Morbid obesity ([place])   . Arthritis   . Type 2 diabetes mellitus ([place])   . Vertigo   . Venous insufficiency   . Sleep apnea, obstructive   . Chronic diastolic heart failure ([place])   . CHF (congestive heart failure) ([place])    Social History   Social History  . Marital Status: Married    Spouse Name: N/A  . Number of Children: N/A  . Years of Education: N/A   Occupational History  . Retired    Social History Main Topics  . Smoking status: Never Smoker   . Smokeless tobacco: Never Used  . Alcohol Use: No  . Drug Use: No  . Sexual Activity: Not Asked   Other Topics Concern  . None   Social History Narrative   Family History  Problem Relation Age of Onset  . Coronary artery disease Son    Scheduled Meds: . calcium-vitamin D  1 tablet Oral BID  . furosemide  80 mg Intravenous BID  . heparin  5,000 Units Subcutaneous 3 times per day  . insulin aspart  0-5 Units Subcutaneous QHS  . insulin aspart  0-9 Units Subcutaneous TID WC  . labetalol  200 mg Oral BID  . lactulose  20 g Oral BID  . latanoprost  1 drop Right Eye QHS  . loratadine  10 mg Oral Daily  . multivitamin with minerals  1 tablet Oral Daily  . sertraline  25 mg Oral QHS  . sodium chloride  3 mL Intravenous Q12H  . spironolactone  25 mg Oral Daily  . tamsulosin  0.4 mg Oral QHS  . vitamin B-12  1,000 mcg Oral Daily   Continuous  Infusions:  PRN Meds:.sodium chloride, acetaminophen, ondansetron (ZOFRAN) IV, sodium chloride Medications Prior to Admission:  Prior to Admission medications   Medication Sig Start Date End Date Taking? Authorizing Provider  benzoyl peroxide 10 % gel Apply 1 application topically 3 (three) times daily.    Historical Provider, MD  calcium-vitamin D (OSCAL WITH D) 500-200 MG-UNIT per tablet Take 1 tablet by mouth 2 (two) times daily.    Historical Provider, MD  cetirizine (ZYRTEC) 10 MG tablet Take 10 mg by mouth every evening.     Historical Provider, MD  Cholecalciferol (D-3-5) 5000 UNITS capsule Take 5,000 Units by mouth daily.     Historical Provider, MD  fluticasone (FLONASE) 50 MCG/ACT nasal spray Place 2 sprays into the nose daily.     Historical Provider, MD  HYDROcodone-acetaminophen (NORCO/VICODIN) 5-325 MG tablet Take 1-2 tablets by mouth every 6 (six) hours as needed for moderate pain. 03/26/15   Kelvin Cellar, MD  insulin aspart (NOVOLOG) 100 UNIT/ML injection Inject 0-12 Units into the skin 3 (three) times daily before meals. Sliding scale based on sugar level 101-150: 3 units 151-200: 4 units 201-250: 8 units 251-300: 12 units    Historical Provider, MD  insulin detemir (LEVEMIR) 100 UNIT/ML injection Inject 0.15 mLs (15 Units total) into the skin at bedtime. 04/19/13   Janece Canterbury, MD  labetalol (NORMODYNE) 200 MG tablet Take 1 tablet (200 mg total) by mouth 2 (two) times daily. 07/09/12   Lavon Paganini Angiulli, PA-C  latanoprost (XALATAN) 0.005 % ophthalmic solution Place 1 drop into the right eye at bedtime.     Historical Provider, MD  lidocaine (LIDODERM) 5 % Place 1 patch onto the skin daily as needed (for pain). Remove & Discard patch within 12 hours or as directed by MD. 07/09/12   Lavon Paganini Angiulli, PA-C  metolazone (ZAROXOLYN) 2.5 MG tablet TAKE 1 TABLET BY MOUTH TWICE PER WEEK SUNDAYS AND THURSDAYS. 11/14/14   Jolaine Artist, MD  Multiple Vitamin (MULTIVITAMIN) tablet  Take 1 tablet by mouth daily.    Historical Provider, MD  potassium chloride SA (K-DUR,KLOR-CON) 20 MEQ tablet Take 2 tablets (40 mEq total) by mouth 2 (two) times daily. Patient taking differently: Take 40 mEq by mouth daily.  09/07/14   Jolaine Artist, MD  sertraline (ZOLOFT) 25 MG tablet Take 25 mg by mouth at bedtime.    Historical Provider, MD  sodium chloride (OCEAN) 0.65 % SOLN nasal spray Place 1 spray into both nostrils as needed for congestion. 04/19/13   Janece Canterbury, MD  spironolactone (ALDACTONE) 25 MG tablet Take 1 tablet (25 mg total) by mouth  daily. 04/22/13   Hosie Poisson, MD  Tamsulosin HCl (FLOMAX) 0.4 MG CAPS Take 0.4 mg by mouth at bedtime.     Historical Provider, MD  vitamin B-12 1000 MCG tablet Take 1 tablet (1,000 mcg total) by mouth daily. 04/19/13   Janece Canterbury, MD   Allergies  Allergen Reactions  . Lyrica [Pregabalin] Swelling    Causes him to have fluid buildup   . Sulfa Antibiotics Other (See Comments)    Night sweats  . Sulfonamide Derivatives Other (See Comments)    Night sweats  . Ambien [Zolpidem Tartrate] Other (See Comments)    Makes him cuss like a sailor  . Penicillins Hives and Rash  . Piroxicam Hives  . Sulfamethoxazole Rash  . Zolpidem Other (See Comments)    Makes him cuss like a sailor    Review of Systems Confused  Physical Exam Weak confused Edema bilateral LE Irregular Diminished anteriorly Abdomen distended  Vital Signs: BP 117/43 mmHg  Pulse 66  Temp(Src) 98 F (36.7 C) (Oral)  Resp 20  Ht 6\' 1"  (1.854 m)  Wt 169.328 kg (373 lb 4.8 oz)  BMI 49.26 kg/m2  SpO2 96%  SpO2: SpO2: 96 % O2 Device:SpO2: 96 % O2 Flow Rate: .O2 Flow Rate (L/min): 2 L/min  IO: Intake/output summary:  Intake/Output Summary (Last 24 hours) at 04/17/15 1635 Last data filed at 04/17/15 1128  Gross per 24 hour  Intake   1060 ml  Output   2752 ml  Net  -1692 ml    LBM: Last BM Date: 04/16/15 (patient is unsure of last BM; last  charted date was today) Baseline Weight: Weight: (!) 166.47 kg (367 lb) Most recent weight: Weight: (!) 169.328 kg (373 lb 4.8 oz)      Palliative Assessment/Data:  Flowsheet Rows        Most Recent Value   Intake Tab    Referral Department  Cardiology   Unit at Time of Referral  Cardiac/Telemetry Unit   Palliative Care Primary Diagnosis  Cardiac   Date Notified  04/16/15   Palliative Care Type  New Palliative care   Reason for referral  Clarify Goals of Care   Date of Admission  04/13/15   Date first seen by Palliative Care  04/17/15   # of days Palliative referral response time  0 Day(s)   # of days IP prior to Palliative referral  3   Clinical Assessment    Palliative Performance Scale Score  20%   Pain Max last 24 hours  4   Pain Min Last 24 hours  3   Dyspnea Max Last 24 Hours  5   Dyspnea Min Last 24 hours  4   Psychosocial & Spiritual Assessment    Palliative Care Outcomes    Patient/Family meeting held?  Yes   Who was at the meeting?  wife daughter grand daughter    Palliative Care Outcomes  Clarified goals of care, Counseled regarding hospice   Palliative Care follow-up planned  Yes, Facility      Additional Data Reviewed:  CBC:    Component Value Date/Time   WBC 4.9 04/17/2015 0415   WBC 4.1 02/26/2015 1318   HGB 8.6* 04/17/2015 0415   HGB 8.3* 02/26/2015 1318   HCT 29.1* 04/17/2015 0415   HCT 26.9* 02/26/2015 1318   PLT 67* 04/17/2015 0415   PLT 100* 02/26/2015 1318   MCV 95.7 04/17/2015 0415   MCV 87.6 02/26/2015 1318   NEUTROABS 2.9 03/23/2015 [id number]  NEUTROABS 2.4 02/26/2015 1318   LYMPHSABS 1.0 03/23/2015 0644   LYMPHSABS 0.7* 02/26/2015 1318   MONOABS 0.6 03/23/2015 0644   MONOABS 0.7 02/26/2015 1318   EOSABS 0.2 03/23/2015 0644   EOSABS 0.3 02/26/2015 1318   BASOSABS 0.0 03/23/2015 0644   BASOSABS 0.0 02/26/2015 1318   Comprehensive Metabolic Panel:    Component Value Date/Time   NA 140 04/17/2015 0415   NA 141 02/26/2015 1318   K 3.3*  04/17/2015 0415   K 4.1 02/26/2015 1318   CL 101 04/17/2015 0415   CO2 30 04/17/2015 0415   CO2 23 02/26/2015 1318   BUN 60* 04/17/2015 0415   BUN 48.4* 02/26/2015 1318   CREATININE 2.43* 04/17/2015 0415   CREATININE 2.3* 02/26/2015 1318   GLUCOSE 120* 04/17/2015 0415   GLUCOSE 126 02/26/2015 1318   CALCIUM 9.4 04/17/2015 0415   CALCIUM 9.5 02/26/2015 1318   AST 25 04/17/2015 0415   AST 16 02/26/2015 1318   ALT 17 04/17/2015 0415   ALT 12 02/26/2015 1318   ALKPHOS 67 04/17/2015 0415   ALKPHOS 77 02/26/2015 1318   BILITOT 3.1* 04/17/2015 0415   BILITOT 1.15 02/26/2015 1318   PROT 5.3* 04/17/2015 0415   PROT 6.5 02/26/2015 1318   ALBUMIN 2.9* 04/17/2015 0415   ALBUMIN 3.7 02/26/2015 1318     Time In: 1400 Time Out: 1500 Time Total: 60 Greater than 50%  of this time was spent counseling and coordinating care related to the above assessment and plan.  Signed by: Loistine Chance, MD [id number] Loistine Chance, MD  04/17/2015, 4:35 PM  Please contact Palliative Medicine Team phone at [phone] for questions and concerns.

## 2015-04-17 NOTE — Progress Notes (Signed)
Occupational Therapy Treatment Patient Details Name: [name] MRN: [id number] DOB: [birth year] Today's Date: 04/17/2015    History of present illness This 81 y.o male admitted from cardiologist's office with worsening SOB, increased edema bil. LEs due to acute on chronic CHF.  PMH includes:  Stage III CKD, A-Fib, IDDM, HTN, OSA, lymphedema, chronic Lt LE edema, morbid obesity    OT comments  Pt appears less confused this session than on eval. Will continue to follow acutely. Recommend SNF for D/C.  Follow Up Recommendations  SNF    Equipment Recommendations  None recommended by OT    Recommendations for Other Services      Precautions / Restrictions Precautions Precautions: Fall Precaution Comments: obesity (watch O2 sats)       Mobility Bed Mobility Overal bed mobility: Needs Assistance;+2 for physical assistance Bed Mobility: Rolling Rolling: Min assist         General bed mobility comments: heavy use of bed rails  Transfers                      Balance                                   ADL                                         General ADL Comments: Assisted nsg with cleaning pt after incontinent episode saturating entire bed. Pt aware of "messing himself" and asking to be cleaned. Pt assisted by rolling side to side by pusing with legs and using rails to pull onto side.       Vision                     Perception     Praxis      Cognition   Behavior During Therapy: Restless Overall Cognitive Status: Impaired/Different from baseline Area of Impairment: Orientation;Attention;Memory;Following commands;Safety/judgement;Awareness;Problem solving Orientation Level: Disoriented to;Time;Situation Current Attention Level: Sustained Memory: Decreased short-term memory  Following Commands: Follows one step commands consistently Safety/Judgement: Decreased awareness of safety;Decreased awareness of  deficits Awareness: Intellectual Problem Solving: Slow processing General Comments: More oriented and less confused this session. Initiating conversation    Extremity/Trunk Assessment               Exercises     Shoulder Instructions       General Comments      Pertinent Vitals/ Pain       Pain Assessment: Faces Faces Pain Scale: Hurts little more Pain Location: Bfeet/legs during bath Pain Descriptors / Indicators: Moaning;Guarding;Grimacing Pain Intervention(s): Limited activity within patient's tolerance  Home Living                                          Prior Functioning/Environment              Frequency Min 2X/week     Progress Toward Goals  OT Goals(current goals can now be found in the care plan section)  Progress towards OT goals: Progressing toward goals  Acute Rehab OT Goals Patient Stated Goal: none stated OT Goal Formulation: With patient/family Time For Goal Achievement: 04/29/15 Potential to Achieve Goals: Good ADL  Goals Pt Will Perform Grooming: with min guard assist;sitting Pt Will Perform Upper Body Bathing: with min assist;sitting Pt Will Transfer to Toilet: with mod assist;with +2 assist;bedside commode Pt/caregiver will Perform Home Exercise Program: Increased strength;Right Upper extremity;Left upper extremity;With theraband  Plan Discharge plan remains appropriate    Co-evaluation                 End of Session     Activity Tolerance Patient tolerated treatment well   Patient Left in bed;with call bell/phone within reach;with nursing/sitter in room   Nurse Communication Mobility status        Time: [id number] OT Time Calculation (min): 29 min  Charges: OT General Charges $OT Visit: 1 Procedure OT Treatments $Self Care/Home Management : 23-37 mins  Donis Kotowski,HILLARY 04/17/2015, 10:30 AM   Maurie Boettcher, OTR/L  [phone] 04/17/2015

## 2015-04-17 NOTE — Progress Notes (Signed)
Patient Name: [name] Date of Encounter: 04/17/2015     Principal Problem:   Acute on chronic diastolic CHF (congestive heart failure) ([place]) Active Problems:   Morbid obesity-BMI 59   Essential hypertension, benign   CAD- mild non obstructive Oct 2006   CKD (chronic kidney disease) stage 3, GFR 30-59 ml/min   Chronic anemia   Thrombocytopenia (HCC)   IDDM (insulin dependent diabetes mellitus) ([place])   Atrial fibrillation (HCC)   OSA treated with BiPAP    SUBJECTIVE  Remains lethargic.  No new symptoms.  Denies chest pain.  Modest diuresis with expected weight loss noted.  CURRENT MEDS . calcium-vitamin D  1 tablet Oral BID  . furosemide  80 mg Intravenous BID  . heparin  5,000 Units Subcutaneous 3 times per day  . insulin aspart  0-5 Units Subcutaneous QHS  . insulin aspart  0-9 Units Subcutaneous TID WC  . labetalol  200 mg Oral BID  . lactulose  20 g Oral BID  . latanoprost  1 drop Right Eye QHS  . loratadine  10 mg Oral Daily  . multivitamin with minerals  1 tablet Oral Daily  . sertraline  25 mg Oral QHS  . sodium chloride  3 mL Intravenous Q12H  . spironolactone  25 mg Oral Daily  . tamsulosin  0.4 mg Oral QHS  . vitamin B-12  1,000 mcg Oral Daily    OBJECTIVE  Filed Vitals:   04/17/15 0120 04/17/15 0338 04/17/15 0449 04/17/15 0804  BP: 130/57 135/34  136/60  Pulse: 69 79  34  Temp:   99.5 F (37.5 C) 98.3 F (36.8 C)  TempSrc:   Oral Oral  Resp: 18 24  25   Height:      Weight:   373 lb 4.8 oz (169.328 kg)   SpO2: 96% 92%  96%    Intake/Output Summary (Last 24 hours) at 04/17/15 1045 Last data filed at 04/17/15 0945  Gross per 24 hour  Intake   1180 ml  Output   3053 ml  Net  -1873 ml   Filed Weights   04/15/15 0500 04/16/15 0500 04/17/15 0449  Weight: 383 lb 14.4 oz (174.136 kg) 380 lb 9.6 oz (172.639 kg) 373 lb 4.8 oz (169.328 kg)    PHYSICAL EXAM  General: Pleasant, NAD. Neuro: Alert and oriented X 3. Moves all extremities  spontaneously. Psych: Normal affect. HEENT: Jugular venous pressure difficult to see  Neck: No carotid bruits Lungs:  Resp regular and unlabored, decreased breath sounds at bases.  Poor Respiratory effort. Heart: Irregularly irregular with controlled ventricular response.  no s3, s4, or murmurs. Abdomen: Soft, non-tender, non-distended, BS + x 4.  Extremities: Marked edema bilaterally with marked lymphedema of the left lower extremity   Accessory Clinical Findings  CBC  Recent Labs  04/15/15 0340 04/17/15 0415  WBC 4.1 4.9  HGB 8.1* 8.6*  HCT 27.0* 29.1*  MCV 94.7 95.7  PLT 84* 67*   Basic Metabolic Panel  Recent Labs  04/16/15 0501 04/17/15 0415  NA 139 140  K 3.6 3.3*  CL 100* 101  CO2 28 30  GLUCOSE 124* 120*  BUN 59* 60*  CREATININE 2.55* 2.43*  CALCIUM 9.0 9.4   Liver Function Tests  Recent Labs  04/16/15 0501 04/17/15 0415  AST 26 25  ALT 17 17  ALKPHOS 64 67  BILITOT 2.3* 3.1*  PROT 5.5* 5.3*  ALBUMIN 2.9* 2.9*   No results for input(s): LIPASE, AMYLASE in the  last 72 hours. Cardiac Enzymes No results for input(s): CKTOTAL, CKMB, CKMBINDEX, TROPONINI in the last 72 hours. BNP Invalid input(s): POCBNP D-Dimer No results for input(s): DDIMER in the last 72 hours. Hemoglobin A1C No results for input(s): HGBA1C in the last 72 hours. Fasting Lipid Panel No results for input(s): CHOL, HDL, LDLCALC, TRIG, CHOLHDL, LDLDIRECT in the last 72 hours. Thyroid Function Tests  Recent Labs  04/16/15 1610  TSH 1.034    TELE  Atrial fibrillation  ECG    Radiology/Studies  Dg Pelvis 1-2 Views  03/23/2015  CLINICAL DATA:  Fall.  Right hip pain. EXAM: PELVIS - 1-2 VIEW COMPARISON:  None. FINDINGS: There is no evidence of pelvic fracture or diastasis. No pelvic bone lesions are seen. Minimal hypertrophic changes present at the superior acetabular margins in both hip joints. IMPRESSION: No pelvic fracture. Minimal osteoarthritis in the weight-bearing  portions of both hip joints. Electronically Signed   By: Ilona Sorrel M.D.   On: 03/23/2015 08:17   Ct Head Wo Contrast  03/22/2015  CLINICAL DATA:  Trip and fall injury, subsequent patient fell from stretcher onto the left side. Now with head and neck pain. Examination is limited as the patient had difficulty fitting into the gantry and was short of breath while laying flat. EXAM: CT HEAD WITHOUT CONTRAST CT CERVICAL SPINE WITHOUT CONTRAST TECHNIQUE: Multidetector CT imaging of the head and cervical spine was performed following the standard protocol without intravenous contrast. Multiplanar CT image reconstructions of the cervical spine were also generated. COMPARISON:  CT head 12/24/2014 FINDINGS: CT HEAD FINDINGS Diffuse cerebral atrophy. Mild ventricular dilatation consistent with central atrophy. Low-attenuation changes in the deep white matter consistent with small vessel ischemia. Basal ganglia calcifications. No mass effect or midline shift. No abnormal extra-axial fluid collections. Gray-white matter junctions are distinct. Basal cisterns are not effaced. No evidence of acute intracranial hemorrhage. No depressed skull fractures. Opacification of left maxillary antrum and some of the bilateral ethmoid air cells. Opacification of some of the mastoid air cells bilaterally. Calcification and deformity of the left globe. Vascular calcifications. CT CERVICAL SPINE FINDINGS Cervical spine examination is significantly limited due to motion artifact. There is mild anterior subluxation of C3 on C4. This is likely degenerative but ligamentous injury is not excluded. Diffuse degenerative change throughout the cervical spine with narrowed cervical interspaces and endplate hypertrophic changes. Degenerative changes throughout the facet joints. Bone encroachment upon neural foramina bilaterally at multiple levels. No prevertebral soft tissue swelling. No vertebral compression deformities. C1-2 articulation appears  intact. IMPRESSION: No acute intracranial abnormalities. Chronic atrophy and small vessel ischemic changes. Opacification of some of the paranasal sinuses. Examination of cervical spine is significant limited due to motion artifact. There is no gross displaced fracture identified. However, there is anterior subluxation of C3 on C4. This is likely degenerative but ligamentous injury is not excluded. Diffuse degenerative changes throughout cervical spine. Electronically Signed   By: Lucienne Capers M.D.   On: 03/22/2015 21:13   Ct Cervical Spine Wo Contrast  03/22/2015  CLINICAL DATA:  Trip and fall injury, subsequent patient fell from stretcher onto the left side. Now with head and neck pain. Examination is limited as the patient had difficulty fitting into the gantry and was short of breath while laying flat. EXAM: CT HEAD WITHOUT CONTRAST CT CERVICAL SPINE WITHOUT CONTRAST TECHNIQUE: Multidetector CT imaging of the head and cervical spine was performed following the standard protocol without intravenous contrast. Multiplanar CT image reconstructions of the cervical spine were  also generated. COMPARISON:  CT head 12/24/2014 FINDINGS: CT HEAD FINDINGS Diffuse cerebral atrophy. Mild ventricular dilatation consistent with central atrophy. Low-attenuation changes in the deep white matter consistent with small vessel ischemia. Basal ganglia calcifications. No mass effect or midline shift. No abnormal extra-axial fluid collections. Gray-white matter junctions are distinct. Basal cisterns are not effaced. No evidence of acute intracranial hemorrhage. No depressed skull fractures. Opacification of left maxillary antrum and some of the bilateral ethmoid air cells. Opacification of some of the mastoid air cells bilaterally. Calcification and deformity of the left globe. Vascular calcifications. CT CERVICAL SPINE FINDINGS Cervical spine examination is significantly limited due to motion artifact. There is mild anterior  subluxation of C3 on C4. This is likely degenerative but ligamentous injury is not excluded. Diffuse degenerative change throughout the cervical spine with narrowed cervical interspaces and endplate hypertrophic changes. Degenerative changes throughout the facet joints. Bone encroachment upon neural foramina bilaterally at multiple levels. No prevertebral soft tissue swelling. No vertebral compression deformities. C1-2 articulation appears intact. IMPRESSION: No acute intracranial abnormalities. Chronic atrophy and small vessel ischemic changes. Opacification of some of the paranasal sinuses. Examination of cervical spine is significant limited due to motion artifact. There is no gross displaced fracture identified. However, there is anterior subluxation of C3 on C4. This is likely degenerative but ligamentous injury is not excluded. Diffuse degenerative changes throughout cervical spine. Electronically Signed   By: Lucienne Capers M.D.   On: 03/22/2015 21:13   Mr Brain Wo Contrast  04/17/2015  CLINICAL DATA:  Acute encephalopathy. History of CHF, atrial fibrillation, diabetes, morbid obesity, chronic kidney disease. EXAM: MRI HEAD WITHOUT CONTRAST TECHNIQUE: Multiplanar, multiecho pulse sequences of the brain and surrounding structures were obtained without intravenous contrast. COMPARISON:  CT head March 22, 2015 FINDINGS: Multiple sequences are moderately or severely motion degraded. No reduced diffusion to suggest acute ischemia. Extremely motion degraded SWAN sequence with possible susceptibility artifact in RIGHT parietal sulci. No corroborative abnormality on the remaining sequences. Ventricles and sulci are normal for patient's age. At least patchy supratentorial white matter FLAIR T2 hyperintensities without midline shift or mass effect. Symmetric mildly prominent bifrontal extra-axial spaces favor underlying parenchymal volume loss, less likely chronic subdural hematomas without mass effect. Major  intracranial vascular flow voids seen at the skull base. LEFT phthisis bulbi. LEFT maxillary sinus mucosal thickening. Bilateral mastoid effusions. Limited assessment for bone marrow signal abnormality due to motion degraded T1 sequences. IMPRESSION: Motion degraded examination. Equivocal findings for susceptibility artifact in the sulci which could be artifact or represent subarachnoid hemorrhage, less favored. As patient had difficulty remaining still, recommend follow-up CT head or, repeat MRI of the brain with sedation. Involutional changes. Suspected moderate chronic small vessel ischemic disease. Electronically Signed   By: Elon Alas M.D.   On: 04/17/2015 03:16   Mr Cervical Spine Wo Contrast  03/23/2015  CLINICAL DATA:  Status post trip and fall 03/22/2015. Increasing diffuse weakness. Question ligamentous injury at C3-4. Initial encounter. EXAM: MRI CERVICAL SPINE WITHOUT CONTRAST TECHNIQUE: Multiplanar, multisequence MR imaging of the cervical spine was performed. No intravenous contrast was administered. COMPARISON:  CT cervical spine 03/22/2015. FINDINGS: The study is degraded by patient motion. Vertebral body height and signal are maintained. Trace anterolisthesis C3 on C4 is due to facet arthropathy. No evidence of ligamentous injury is seen. The craniocervical junction is normal and cervical cord signal is normal. Imaged paraspinous structures are unremarkable. Mucosal thickening is noted in the right maxillary sinus. C2-3: Facet degenerative disease is seen.  The central canal and foramina appear open. C3-4: Facet arthropathy is seen. The disc is uncovered without bulging. The central canal is open. There is likely moderate bilateral foraminal narrowing although evaluation is degraded by motion. C4-5: Mild posterior bony ridging and uncovertebral disease. The central canal is open. There appears to be bilateral foraminal narrowing. C5-6: Small disc osteophyte complex, facet arthropathy and  uncovertebral disease are seen. The ventral thecal sac is nearly effaced. The foramina are narrowed. C6-7: Small disc osteophyte complex effaces the ventral thecal sac. Uncovertebral disease appears worse on the left there is left worse than right foraminal narrowing. C7-T1: Minimal disc bulge without central canal or foraminal stenosis. IMPRESSION: Trace anterolisthesis C3 on C4 seen on comparison CT scan is due to facet degenerative disease. No evidence of posttraumatic change is present on this examination. Mild multilevel disc bulging as described above most notable at C5-6 and C6-7 where the ventral thecal sac is effaced. There appears to be multilevel foraminal narrowing. Evaluation is limited by patient motion. Electronically Signed   By: Inge Rise M.D.   On: 03/23/2015 13:07   US Renal  03/24/2015  CLINICAL DATA:  Acute renal insufficiency EXAM: RENAL / URINARY TRACT ULTRASOUND COMPLETE COMPARISON:  None. FINDINGS: Right Kidney: Length: 14 cm in length. There is cortical thinning probable due to atrophy. No hydronephrosis. No renal calculus. Left Kidney: Not identified due to patient's large body habitus. Bladder: There is a Foley catheter within decompressed urinary bladder. IMPRESSION: No right hydronephrosis. Cortical thinning probable due to atrophy. There left kidney is not identified. Foley catheter within decompressed urinary bladder. Electronically Signed   By: Lahoma Crocker M.D.   On: 03/24/2015 18:34   Dg Chest Port 1 View  04/17/2015  CLINICAL DATA:  Shortness of breath today. EXAM: PORTABLE CHEST 1 VIEW COMPARISON:  Single view of the chest 04/13/2015, 03/23/2015 08/16/2014. FINDINGS: There is cardiomegaly and vascular congestion without frank edema. Left basilar atelectasis is noted. No pneumothorax. Aortic atherosclerosis is seen. IMPRESSION: Cardiomegaly and pulmonary vascular congestion. Electronically Signed   By: Inge Rise M.D.   On: 04/17/2015 08:02   Dg Chest Port 1  View  04/13/2015  CLINICAL DATA:  81 year old male with acute shortness of breath. EXAM: PORTABLE CHEST 1 VIEW COMPARISON:  03/23/2015 and prior chest radiographs FINDINGS: Cardiomegaly and mild pulmonary vascular congestion noted. Bibasilar opacities likely represent atelectasis. There is no evidence of pneumothorax. No acute bony abnormalities are identified. IMPRESSION: Cardiomegaly, mild pulmonary vascular congestion and probable bibasilar atelectasis. Electronically Signed   By: Margarette Canada M.D.   On: 04/13/2015 17:41   Dg Chest Port 1 View  03/23/2015  CLINICAL DATA:  Shortness of breath, acute renal failure. EXAM: PORTABLE CHEST 1 VIEW COMPARISON:  December 24, 2014. FINDINGS: Stable cardiomegaly is noted. No pneumothorax or significant pleural effusion is noted. Mild diffuse interstitial densities are noted throughout both lungs, right greater than left, concerning for mild pulmonary edema. Stable mild central pulmonary vascular congestion is noted. Bony thorax is unremarkable. IMPRESSION: Stable cardiomegaly and central pulmonary vascular congestion is noted, with mild diffuse interstitial densities throughout both lungs concerning for pulmonary edema. Electronically Signed   By: Marijo Conception, M.D.   On: 03/23/2015 07:46   Dg Knee Complete 4 Views Right  03/22/2015  CLINICAL DATA:  Initial evaluation for acute trauma, fall. EXAM: RIGHT KNEE - COMPLETE 4+ VIEW COMPARISON:  None. FINDINGS: Diffuse osteopenia present, somewhat limiting evaluation for subtle nondisplaced fractures. No acute fracture or dislocation identified.  No joint effusion. Severe tricompartmental degenerative osteoarthrosis. Prepatellar soft tissue swelling. IMPRESSION: 1. No acute fracture or dislocation. 2. Prepatellar soft tissue swelling. 3. Severe tricompartmental degenerative osteoarthrosis. 4. Diffuse osteopenia. Electronically Signed   By: Jeannine Boga M.D.   On: 03/22/2015 21:15   Dg Hand Complete  Left  03/22/2015  CLINICAL DATA:  Status post fall at home while walking. Left hand swelling. Initial encounter. EXAM: LEFT HAND - COMPLETE 3+ VIEW COMPARISON:  None. FINDINGS: There is no evidence of fracture or dislocation. Joint space narrowing is noted at the first carpometacarpal joint, and there is mild diffuse osteopenia of visualized osseous structures. The carpal rows are intact, and demonstrate normal alignment. Diffuse soft tissue swelling is noted about the wrist. IMPRESSION: No evidence of fracture or dislocation. Electronically Signed   By: Garald Balding M.D.   On: 03/22/2015 21:17   Dg Hand Complete Right  03/22/2015  CLINICAL DATA:  Patient fell at home while walking. Swelling in both hands. EXAM: RIGHT HAND - COMPLETE 3+ VIEW COMPARISON:  None. FINDINGS: Degenerative changes throughout the joints of the right hand and wrist. No evidence of acute fracture or subluxation. No focal bone lesion or bone destruction. Bone cortex and trabecular architecture appear intact. No radiopaque soft tissue foreign bodies. IMPRESSION: Prominent diffuse degenerative changes throughout the right hand and wrist. No acute fractures identified. Electronically Signed   By: Lucienne Capers M.D.   On: 03/22/2015 21:16    ASSESSMENT AND PLAN  1. Evidence of marked fluid overload and lymphedema with history of diastolic heart failure. This is complicated by renal insufficiency, anemia, and atrial fibrillation.   2. Acute on chronic diastolic heart failure, last LVEF 45-50% in April. Now 60%, with grade 2 diatsolic dysfunction and elevated filling pressures. Severe pulmonary hypertensin. Continue aggressive diuresis.   3. Acute on chronic renal failure, last creatinine was 2.8 in November. He follows with Dr. Justin Mend.  4. Atrial fibrillation documented by ECG in November, no prior history of same. He is not aware of any palpitations and I am not certain about the duration. Very complex patient. At this  point he would not appear to be a good candidate for long-term anticoagulation because of his dementia and multiple comorbidities  5. History of nonobstructive CAD based on prior assessment, no obvious angina.  6. Obstructive sleep apnea, on CPAP but not clear about consistency.  Plan: Continue IV diuresis. Consider palliative care consult.   Signed, Warren Danes MD

## 2015-04-17 NOTE — Progress Notes (Signed)
[facility] PROGRESS NOTE  [name] [id number] DOB: 1932-02-08 DOA: 04/13/2015 PCP: Wende Neighbors, MD  Assessment/Plan: 81 y.o. male with a history of Diastolic CHF, Stage III CKD, Atrial Fibrillation, IDDM, HTN. OSA, Lymphedema, chronci leg edema who presents to the ED with complaints of worsening SOB, and edema of both legs for quite some time. -He was seen by his Cardiologist Dr. Domenic Polite today and advised to go to the ED for hospitalization due to acute on chronic CHF.    Assessment and plan 1.Acute on chronic diastolic CHF. Previous echo. LEVF 40-45%. With evidence of volume overload or lymphedema -2-D echo shows EF of 60%, with grade 2 diatsolic dysfunction and elevated filling pressures. Severe pulmonary hypertensin. Continue aggressive diuresis.  cont aggressive diuresis per cardiology. cont BB, patient is not a good candidate for ACE/ARB due to renal issues. Consider hydralazine+NTG if BP tolerates   2.Atrial fibrillation. Not a good candidate for Anticoagulant Rx. Very complex patient, would not appear to be a good candidate for long-term anticoagulation because of his dementia and multiple comorbidities  3. IDDM. Hold Levemir due to borderline glucose. Hemoglobin A1c 5.9 . Cont SSI   4.OSA treated with BiPAP. BIPAP qhs and PRN  5. Morbid obesity-BMI 51. Needs Weight Loss  6. CKD stage IV. Lymphedema, chronci leg edema. Complicated by renal insufficiency, atrial fibrillation, anemia, continue Lasix 80 mg IV BID Baseline renal function 2.5-2.6, currently creatinine 2.48, continue diuresis  7.  Acute encephalopathy worsening MS , since 12/20, check ABG,TSH, ammonia level 80, started on lactulose with minimal improvement , repeat cxr negative  MRI of the brain equal focal because of motion of artifact, CT of the head ordered to rule out a bleed   Code Status: full Family Communication: d/w patient  (indicate person spoken with, relationship, and if by phone,  the number) Disposition Plan: continue on IV diuresis , cardiology recommends palliative care consultation.Discussed with family about poor prognosis,made a DNI , needs to be a full DO NOT RESUSCITATE because of poor prognosis, continue ongoing family discussion    Consultants:  Cardiology   Procedures:  Pend echo   Antibiotics:  none (indicate start date, and stop date if known)  HPI/Subjective: Patient continues to be lethargic at this time, but arousable to sternal rub,   Objective: Filed Vitals:   04/17/15 0449 04/17/15 0804  BP:  136/60  Pulse:  34  Temp: 99.5 F (37.5 C) 98.3 F (36.8 C)  Resp:  25    Intake/Output Summary (Last 24 hours) at 04/17/15 1252 Last data filed at 04/17/15 1128  Gross per 24 hour  Intake   1300 ml  Output   3052 ml  Net  -1752 ml   Filed Weights   04/15/15 0500 04/16/15 0500 04/17/15 0449  Weight: 174.136 kg (383 lb 14.4 oz) 172.639 kg (380 lb 9.6 oz) 169.328 kg (373 lb 4.8 oz)    Exam: General: Pleasant, NAD. Demented Neuro: Marland Kitchen Moves all extremities spontaneously. Psych: Normal affect. HEENT: Normal Neck: Supple without bruits or JVD. Lungs: Resp regular and unlabored, poor inspiratory effort. Decreased breath sounds at bases. Heart: Irregularly irregular. no s3, s4, or murmurs. Abdomen: Soft, non-tender, non-distended, BS + x 4.  Extremities: Marked peripheral edema. Marked lymphedema of the left lower extremity.  Data Reviewed: Basic Metabolic Panel:  Recent Labs Lab 04/13/15 1636 04/14/15 0320 04/15/15 0340 04/16/15 0501 04/17/15 0415  NA 140 141 140 139 140  K 4.5 4.2 3.7 3.6 3.3*  CL 105 105  103 100* 101  CO2 24 26 26 28 30   GLUCOSE 120* 116* 107* 124* 120*  BUN 55* 57* 57* 59* 60*  CREATININE 2.64* 2.63* 2.58* 2.55* 2.43*  CALCIUM 8.9 8.9 9.0 9.0 9.4   Liver Function Tests:  Recent Labs Lab 04/16/15 0501 04/17/15 0415  AST 26 25  ALT 17 17  ALKPHOS 64 67  BILITOT 2.3* 3.1*  PROT  5.5* 5.3*  ALBUMIN 2.9* 2.9*   No results for input(s): LIPASE, AMYLASE in the last 168 hours.  Recent Labs Lab 04/16/15 1624  AMMONIA 80*   CBC:  Recent Labs Lab 04/13/15 1636 04/15/15 0340 04/17/15 0415  WBC 5.4 4.1 4.9  HGB 8.1* 8.1* 8.6*  HCT 27.8* 27.0* 29.1*  MCV 94.9 94.7 95.7  PLT 101* 84* 67*   Cardiac Enzymes: No results for input(s): CKTOTAL, CKMB, CKMBINDEX, TROPONINI in the last 168 hours. BNP (last 3 results)  Recent Labs  08/16/14 1945 04/13/15 1646  BNP 869.7* 798.0*    ProBNP (last 3 results) No results for input(s): PROBNP in the last 8760 hours.  CBG:  Recent Labs Lab 04/16/15 1119 04/16/15 1619 04/16/15 2106 04/17/15 0731 04/17/15 1203  GLUCAP 124* 117* 162* 157* 155*    Recent Results (from the past 240 hour(s))  MRSA PCR Screening     Status: None   Collection Time: 04/13/15 10:31 PM  Result Value Ref Range Status   MRSA by PCR NEGATIVE NEGATIVE Final    Comment:        The GeneXpert MRSA Assay (FDA approved for NASAL specimens only), is one component of a comprehensive MRSA colonization surveillance program. It is not intended to diagnose MRSA infection nor to guide or monitor treatment for MRSA infections.      Studies: Mr Brain Wo Contrast  04/17/2015  CLINICAL DATA:  Acute encephalopathy. History of CHF, atrial fibrillation, diabetes, morbid obesity, chronic kidney disease. EXAM: MRI HEAD WITHOUT CONTRAST TECHNIQUE: Multiplanar, multiecho pulse sequences of the brain and surrounding structures were obtained without intravenous contrast. COMPARISON:  CT head March 22, 2015 FINDINGS: Multiple sequences are moderately or severely motion degraded. No reduced diffusion to suggest acute ischemia. Extremely motion degraded SWAN sequence with possible susceptibility artifact in RIGHT parietal sulci. No corroborative abnormality on the remaining sequences. Ventricles and sulci are normal for patient's age. At least patchy  supratentorial white matter FLAIR T2 hyperintensities without midline shift or mass effect. Symmetric mildly prominent bifrontal extra-axial spaces favor underlying parenchymal volume loss, less likely chronic subdural hematomas without mass effect. Major intracranial vascular flow voids seen at the skull base. LEFT phthisis bulbi. LEFT maxillary sinus mucosal thickening. Bilateral mastoid effusions. Limited assessment for bone marrow signal abnormality due to motion degraded T1 sequences. IMPRESSION: Motion degraded examination. Equivocal findings for susceptibility artifact in the sulci which could be artifact or represent subarachnoid hemorrhage, less favored. As patient had difficulty remaining still, recommend follow-up CT head or, repeat MRI of the brain with sedation. Involutional changes. Suspected moderate chronic small vessel ischemic disease. Electronically Signed   By: Elon Alas M.D.   On: 04/17/2015 03:16   Dg Chest Port 1 View  04/17/2015  CLINICAL DATA:  Shortness of breath today. EXAM: PORTABLE CHEST 1 VIEW COMPARISON:  Single view of the chest 04/13/2015, 03/23/2015 08/16/2014. FINDINGS: There is cardiomegaly and vascular congestion without frank edema. Left basilar atelectasis is noted. No pneumothorax. Aortic atherosclerosis is seen. IMPRESSION: Cardiomegaly and pulmonary vascular congestion. Electronically Signed   By: Inge Rise M.D.  On: 04/17/2015 08:02    Scheduled Meds: . calcium-vitamin D  1 tablet Oral BID  . furosemide  80 mg Intravenous BID  . heparin  5,000 Units Subcutaneous 3 times per day  . insulin aspart  0-5 Units Subcutaneous QHS  . insulin aspart  0-9 Units Subcutaneous TID WC  . labetalol  200 mg Oral BID  . lactulose  20 g Oral BID  . latanoprost  1 drop Right Eye QHS  . loratadine  10 mg Oral Daily  . multivitamin with minerals  1 tablet Oral Daily  . sertraline  25 mg Oral QHS  . sodium chloride  3 mL Intravenous Q12H  . spironolactone  25  mg Oral Daily  . tamsulosin  0.4 mg Oral QHS  . vitamin B-12  1,000 mcg Oral Daily   Continuous Infusions:   Principal Problem:   Acute on chronic diastolic CHF (congestive heart failure) (HCC) Active Problems:   Morbid obesity-BMI 51   Essential hypertension, benign   CAD- mild non obstructive Oct 2006   CKD (chronic kidney disease) stage 3, GFR 30-59 ml/min   Chronic anemia   Thrombocytopenia (HCC)   IDDM (insulin dependent diabetes mellitus) ([place])   Atrial fibrillation (HCC)   OSA treated with BiPAP    Time spent: >35 minutes     [facility] Pager [phone]. If 7PM-7AM, please contact night-coverage at [email or web address], password [facility] 04/17/2015, 12:52 PM  LOS: 4 days

## 2015-04-18 DIAGNOSIS — R4182 Altered mental status, unspecified: Secondary | ICD-10-CM

## 2015-04-18 LAB — GLUCOSE, CAPILLARY
GLUCOSE-CAPILLARY: 115 mg/dL — AB (ref 65–99)
GLUCOSE-CAPILLARY: 168 mg/dL — AB (ref 65–99)
Glucose-Capillary: 136 mg/dL — ABNORMAL HIGH (ref 65–99)
Glucose-Capillary: 132 mg/dL — ABNORMAL HIGH (ref 65–99)

## 2015-04-18 LAB — COMPREHENSIVE METABOLIC PANEL
ALT: 14 U/L — ABNORMAL LOW (ref 17–63)
AST: 21 U/L (ref 15–41)
Albumin: 2.7 g/dL — ABNORMAL LOW (ref 3.5–5.0)
Alkaline Phosphatase: 59 U/L (ref 38–126)
Anion gap: 9 (ref 5–15)
BUN: 60 mg/dL — AB (ref 6–20)
CHLORIDE: 99 mmol/L — AB (ref 101–111)
CO2: 31 mmol/L (ref 22–32)
Calcium: 9.2 mg/dL (ref 8.9–10.3)
Creatinine, Ser: 2.33 mg/dL — ABNORMAL HIGH (ref 0.61–1.24)
GFR calc Af Amer: 29 mL/min — ABNORMAL LOW (ref >60)
GFR, EST NON AFRICAN AMERICAN: 25 mL/min — AB (ref >60)
Glucose, Bld: 120 mg/dL — ABNORMAL HIGH (ref 65–99)
POTASSIUM: 3.2 mmol/L — AB (ref 3.5–5.1)
SODIUM: 139 mmol/L (ref 135–145)
Total Bilirubin: 1.9 mg/dL — ABNORMAL HIGH (ref 0.3–1.2)
Total Protein: 5.1 g/dL — ABNORMAL LOW (ref 6.5–8.1)

## 2015-04-18 LAB — CBC
HCT: 27.6 % — ABNORMAL LOW (ref 39.0–52.0)
Hemoglobin: 8.3 g/dL — ABNORMAL LOW (ref 13.0–17.0)
MCH: 28.9 pg (ref 26.0–34.0)
MCHC: 30.1 g/dL (ref 30.0–36.0)
MCV: 96.2 fL (ref 78.0–100.0)
PLATELETS: 70 10*3/uL — AB (ref 150–400)
RBC: 2.87 MIL/uL — AB (ref 4.22–5.81)
RDW: 23.5 % — AB (ref 11.5–15.5)
WBC: 5 10*3/uL (ref 4.0–10.5)

## 2015-04-18 LAB — AMMONIA: AMMONIA: 74 umol/L — AB (ref 9–35)

## 2015-04-18 MED ORDER — POTASSIUM CHLORIDE 10 MEQ/100ML IV SOLN: 10.0000 meq | INTRAVENOUS | Status: DC

## 2015-04-18 MED ORDER — CHLORHEXIDINE GLUCONATE 0.12 % MT SOLN
15.0000 mL | Freq: Two times a day (BID) | OROMUCOSAL | Status: DC
  Administered 2015-04-18 – 2015-04-24 (×13): 15 mL via OROMUCOSAL
  Filled 2015-04-18 (×12): qty 15

## 2015-04-18 MED ORDER — CETYLPYRIDINIUM CHLORIDE 0.05 % MT LIQD
7.0000 mL | Freq: Two times a day (BID) | OROMUCOSAL | Status: DC
  Administered 2015-04-18 – 2015-04-23 (×12): 7 mL via OROMUCOSAL

## 2015-04-18 MED ORDER — POTASSIUM CHLORIDE 10 MEQ/100ML IV SOLN
10.0000 meq | INTRAVENOUS | Status: AC
  Administered 2015-04-18 (×3): 10 meq via INTRAVENOUS
  Filled 2015-04-18 (×3): qty 100

## 2015-04-18 MED ORDER — LACTULOSE 10 GM/15ML PO SOLN
20.0000 g | Freq: Three times a day (TID) | ORAL | Status: DC
  Administered 2015-04-18 – 2015-04-20 (×7): 20 g via ORAL
  Filled 2015-04-18 (×7): qty 30

## 2015-04-18 NOTE — Progress Notes (Signed)
Large BM. Provided incontinent care and bed-bath/linen change. Patient remains confused, but is much more alert and conversant than previously this evening.  Safe to advance diet slowly.  Will contact NP on-call for [facility] to make aware.

## 2015-04-18 NOTE — Progress Notes (Signed)
[facility]                                                                              Patient Demographics  [name], is a 81 y.o. male, DOB - [birth year], [id number]  Admit date - 04/13/2015   Admitting Physician Theressa Millard, MD  Outpatient Primary MD for the patient is Wende Neighbors, MD  LOS - 5   Chief Complaint  Patient presents with  . Leg Swelling  . Congestive Heart Failure       Brief HPI   81 y.o. male with a history of Diastolic CHF, Stage III CKD, Atrial Fibrillation, IDDM, HTN. OSA, Lymphedema, chronci leg edema who presents to the ED with complaints of worsening SOB, and edema of both legs for quite some time. -He was seen by his Cardiologist Dr. Domenic Polite today and advised to go to the ED for hospitalization due to acute on chronic CHF.   Assessment & Plan   Primary problem Acute on chronic diastolic CHF.  - 2-D echo shows EF of 60%, with grade 2 diastolic and elevated filling pressures. Severe pulmonary hypertensin.  -  Continue aggressive diuresis per cardiology recommendations - Continue beta blocker, patient is not a good candidate for ACE/ARB due to renal issues.  Active problems Atrial fibrillation.  - Not a good candidate for Anticoagulant Rx. Per cardiology, complex patient, would not appear to be a good candidate for long-term anticoagulation because of his dementia and multiple comorbidities   IDDM.  - Hold Levemir due to borderline glucose. Hemoglobin A1c 5.9 .  - Continue sliding scale insulin    OSA treated with BiPAP. -  BIPAP qhs and PRN   Morbid obesity-BMI 51.  - Patient counseled on diet and weight control   CKD stage IV. Lymphedema, chronic leg edema. Complicated by renal insufficiency, atrial fibrillation, anemia, -  continue Lasix 80 mg IV BID, baseline renal function 2.5-2.6   Acute encephalopathy: Mental status worsening since 12/20 with hyperammonemia - Patient received lactulose enema this  morning, placed on oral lactulose as he is much more alert and awake. -CT head with no mass or acute infarct or bleed.  - Palliative medicine also following, if no significant improvement in next 24-48 hours in overall status, family contemplating comfort measures  Code Status: DO NOT RESUSCITATE  Family Communication: Discussed in detail with the patient, all imaging results, lab results explained to the patient    Disposition Plan: - Palliative medicine also following, if no significant improvement in next 24-48 hours in overall status, family contemplating comfort measures   Time Spent in minutes  25 minutes  Procedures  2-D echo  Consults   Cardiology Palliative medicine  DVT Prophylaxis   SCD's   Medications  Scheduled Meds: . antiseptic oral rinse  7 mL Mouth Rinse q12n4p  . calcium-vitamin D  1 tablet Oral BID  . chlorhexidine  15 mL Mouth Rinse BID  . furosemide  80 mg Intravenous BID  . heparin  5,000 Units Subcutaneous 3 times per day  . insulin aspart  0-5 Units Subcutaneous QHS  . insulin aspart  0-9 Units Subcutaneous TID  WC  . labetalol  200 mg Oral BID  . lactulose  20 g Oral TID  . latanoprost  1 drop Right Eye QHS  . loratadine  10 mg Oral Daily  . multivitamin with minerals  1 tablet Oral Daily  . sertraline  25 mg Oral QHS  . sodium chloride  3 mL Intravenous Q12H  . spironolactone  25 mg Oral Daily  . tamsulosin  0.4 mg Oral QHS  . vitamin B-12  1,000 mcg Oral Daily   Continuous Infusions:  PRN Meds:.sodium chloride, acetaminophen, ondansetron (ZOFRAN) IV, sodium chloride   Antibiotics   Anti-infectives    None        Subjective:   [name] was seen and examined today.  Much more alert and awake, able to tell that he said [facility]. Patient denies dizziness, chest pain, shortness of breath, abdominal pain, N/V/D/C, new weakness, numbess, tingling. No acute events overnight.    Objective:   Blood pressure 110/59,  pulse 81, temperature 98 F (36.7 C), temperature source Oral, resp. rate 19, height 6\' 1"  (1.854 m), weight 165.336 kg (364 lb 8 oz), SpO2 92 %.  Wt Readings from Last 3 Encounters:  04/18/15 165.336 kg (364 lb 8 oz)  04/13/15 166.47 kg (367 lb)  03/25/15 179.1 kg (394 lb 13.5 oz)     Intake/Output Summary (Last 24 hours) at 04/18/15 1214 Last data filed at 04/18/15 1048  Gross per 24 hour  Intake    723 ml  Output   3825 ml  Net  -3102 ml    Exam  General: Alert and oriented x 2, NAD, has dementia  HEENT:  PERRLA, EOMI, Anicteric Sclera, mucous membranes moist.   Neck: Supple, no JVD, no masses  CVS: S1 S2 auscultated, no rubs, murmurs or gallops. Regular rate and rhythm.  Respiratory: Clear to auscultation bilaterally, no wheezing, rales or rhonchi  Abdomen: Morbidly obese, Soft, nontender, nondistended, + bowel sounds  Ext: no cyanosis clubbing, marked peripheral edema with lymphedema  Neuro: no new deficits  Skin: Lymphedema with chronic venous stasis changes  Psych: Normal affect and demeanor, alert and oriented 2   Data Review   Micro Results Recent Results (from the past 240 hour(s))  MRSA PCR Screening     Status: None   Collection Time: 04/13/15 10:31 PM  Result Value Ref Range Status   MRSA by PCR NEGATIVE NEGATIVE Final    Comment:        The GeneXpert MRSA Assay (FDA approved for NASAL specimens only), is one component of a comprehensive MRSA colonization surveillance program. It is not intended to diagnose MRSA infection nor to guide or monitor treatment for MRSA infections.     Radiology Reports Dg Pelvis 1-2 Views  03/23/2015  CLINICAL DATA:  Fall.  Right hip pain. EXAM: PELVIS - 1-2 VIEW COMPARISON:  None. FINDINGS: There is no evidence of pelvic fracture or diastasis. No pelvic bone lesions are seen. Minimal hypertrophic changes present at the superior acetabular margins in both hip joints. IMPRESSION: No pelvic fracture. Minimal  osteoarthritis in the weight-bearing portions of both hip joints. Electronically Signed   By: Ilona Sorrel M.D.   On: 03/23/2015 08:17   Ct Head Wo Contrast  04/17/2015  CLINICAL DATA:  Altered mental status.  Encephalopathy. EXAM: CT HEAD WITHOUT CONTRAST TECHNIQUE: Contiguous axial images were obtained from the base of the skull through the vertex without intravenous contrast. COMPARISON:  Head CT March 22, 2015 and  brain MRI April 17, 2015 FINDINGS: There is stable mild generalized atrophy with slightly greater atrophy in the left frontal and left temporal lobe regions than elsewhere. There is no intracranial mass, hemorrhage, extra-axial fluid collection, or midline shift. Mild small vessel disease in the centra semiovale bilaterally is stable. There is no new gray-white compartment lesion. No acute infarct evident on this study. The bony calvarium appears intact. There is chronic appearing mastoid disease bilaterally. There is a chronic defect in the left lamina papyracea with evidence of old maxillary antral region fractures on the left. There is mucosal thickening in the left maxillary antrum. 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 Right orbit appears normal. IMPRESSION: Atrophy with mild small vessel disease, stable. No hemorrhage, mass, or acute appearing infarct. Evidence of old left facial and left globe trauma. Chronic mastoid disease bilaterally. Electronically Signed   By: Lowella Grip III M.D.   On: 04/17/2015 14:59   Ct Head Wo Contrast  03/22/2015  CLINICAL DATA:  Trip and fall injury, subsequent patient fell from stretcher onto the left side. Now with head and neck pain. Examination is limited as the patient had difficulty fitting into the gantry and was short of breath while laying flat. EXAM: CT HEAD WITHOUT CONTRAST CT CERVICAL SPINE WITHOUT CONTRAST TECHNIQUE: Multidetector CT imaging of the head and cervical spine was performed  following the standard protocol without intravenous contrast. Multiplanar CT image reconstructions of the cervical spine were also generated. COMPARISON:  CT head 12/24/2014 FINDINGS: CT HEAD FINDINGS Diffuse cerebral atrophy. Mild ventricular dilatation consistent with central atrophy. Low-attenuation changes in the deep white matter consistent with small vessel ischemia. Basal ganglia calcifications. No mass effect or midline shift. No abnormal extra-axial fluid collections. Gray-white matter junctions are distinct. Basal cisterns are not effaced. No evidence of acute intracranial hemorrhage. No depressed skull fractures. Opacification of left maxillary antrum and some of the bilateral ethmoid air cells. Opacification of some of the mastoid air cells bilaterally. Calcification and deformity of the left globe. Vascular calcifications. CT CERVICAL SPINE FINDINGS Cervical spine examination is significantly limited due to motion artifact. There is mild anterior subluxation of C3 on C4. This is likely degenerative but ligamentous injury is not excluded. Diffuse degenerative change throughout the cervical spine with narrowed cervical interspaces and endplate hypertrophic changes. Degenerative changes throughout the facet joints. Bone encroachment upon neural foramina bilaterally at multiple levels. No prevertebral soft tissue swelling. No vertebral compression deformities. C1-2 articulation appears intact. IMPRESSION: No acute intracranial abnormalities. Chronic atrophy and small vessel ischemic changes. Opacification of some of the paranasal sinuses. Examination of cervical spine is significant limited due to motion artifact. There is no gross displaced fracture identified. However, there is anterior subluxation of C3 on C4. This is likely degenerative but ligamentous injury is not excluded. Diffuse degenerative changes throughout cervical spine. Electronically Signed   By: Lucienne Capers M.D.   On: 03/22/2015 21:13     Ct Cervical Spine Wo Contrast  03/22/2015  CLINICAL DATA:  Trip and fall injury, subsequent patient fell from stretcher onto the left side. Now with head and neck pain. Examination is limited as the patient had difficulty fitting into the gantry and was short of breath while laying flat. EXAM: CT HEAD WITHOUT CONTRAST CT CERVICAL SPINE WITHOUT CONTRAST TECHNIQUE: Multidetector CT imaging of the head and cervical spine was performed following the standard protocol without intravenous contrast. Multiplanar CT image reconstructions of the cervical spine were also generated. COMPARISON:  CT head 12/24/2014 FINDINGS: CT HEAD FINDINGS Diffuse cerebral atrophy. Mild ventricular dilatation consistent with central atrophy. Low-attenuation changes in the deep white matter consistent with small vessel ischemia. Basal ganglia calcifications. No mass effect or midline shift. No abnormal extra-axial fluid collections. Gray-white matter junctions are distinct. Basal cisterns are not effaced. No evidence of acute intracranial hemorrhage. No depressed skull fractures. Opacification of left maxillary antrum and some of the bilateral ethmoid air cells. Opacification of some of the mastoid air cells bilaterally. Calcification and deformity of the left globe. Vascular calcifications. CT CERVICAL SPINE FINDINGS Cervical spine examination is significantly limited due to motion artifact. There is mild anterior subluxation of C3 on C4. This is likely degenerative but ligamentous injury is not excluded. Diffuse degenerative change throughout the cervical spine with narrowed cervical interspaces and endplate hypertrophic changes. Degenerative changes throughout the facet joints. Bone encroachment upon neural foramina bilaterally at multiple levels. No prevertebral soft tissue swelling. No vertebral compression deformities. C1-2 articulation appears intact. IMPRESSION: No acute intracranial abnormalities. Chronic atrophy and small vessel  ischemic changes. Opacification of some of the paranasal sinuses. Examination of cervical spine is significant limited due to motion artifact. There is no gross displaced fracture identified. However, there is anterior subluxation of C3 on C4. This is likely degenerative but ligamentous injury is not excluded. Diffuse degenerative changes throughout cervical spine. Electronically Signed   By: Lucienne Capers M.D.   On: 03/22/2015 21:13   Mr Brain Wo Contrast  04/17/2015  CLINICAL DATA:  Acute encephalopathy. History of CHF, atrial fibrillation, diabetes, morbid obesity, chronic kidney disease. EXAM: MRI HEAD WITHOUT CONTRAST TECHNIQUE: Multiplanar, multiecho pulse sequences of the brain and surrounding structures were obtained without intravenous contrast. COMPARISON:  CT head March 22, 2015 FINDINGS: Multiple sequences are moderately or severely motion degraded. No reduced diffusion to suggest acute ischemia. Extremely motion degraded SWAN sequence with possible susceptibility artifact in RIGHT parietal sulci. No corroborative abnormality on the remaining sequences. Ventricles and sulci are normal for patient's age. At least patchy supratentorial white matter FLAIR T2 hyperintensities without midline shift or mass effect. Symmetric mildly prominent bifrontal extra-axial spaces favor underlying parenchymal volume loss, less likely chronic subdural hematomas without mass effect. Major intracranial vascular flow voids seen at the skull base. LEFT phthisis bulbi. LEFT maxillary sinus mucosal thickening. Bilateral mastoid effusions. Limited assessment for bone marrow signal abnormality due to motion degraded T1 sequences. IMPRESSION: Motion degraded examination. Equivocal findings for susceptibility artifact in the sulci which could be artifact or represent subarachnoid hemorrhage, less favored. As patient had difficulty remaining still, recommend follow-up CT head or, repeat MRI of the brain with sedation.  Involutional changes. Suspected moderate chronic small vessel ischemic disease. Electronically Signed   By: Elon Alas M.D.   On: 04/17/2015 03:16   Mr Cervical Spine Wo Contrast  03/23/2015  CLINICAL DATA:  Status post trip and fall 03/22/2015. Increasing diffuse weakness. Question ligamentous injury at C3-4. Initial encounter. EXAM: MRI CERVICAL SPINE WITHOUT CONTRAST TECHNIQUE: Multiplanar, multisequence MR imaging of the cervical spine was performed. No intravenous contrast was administered. COMPARISON:  CT cervical spine 03/22/2015. FINDINGS: The study is degraded by patient motion. Vertebral body height and signal are maintained. Trace anterolisthesis C3 on C4 is due to facet arthropathy. No evidence of ligamentous injury is seen. The craniocervical junction is normal and cervical cord signal is normal. Imaged paraspinous structures are unremarkable. Mucosal thickening is noted in the right maxillary sinus. C2-3: Facet degenerative disease is seen. The central canal and  foramina appear open. C3-4: Facet arthropathy is seen. The disc is uncovered without bulging. The central canal is open. There is likely moderate bilateral foraminal narrowing although evaluation is degraded by motion. C4-5: Mild posterior bony ridging and uncovertebral disease. The central canal is open. There appears to be bilateral foraminal narrowing. C5-6: Small disc osteophyte complex, facet arthropathy and uncovertebral disease are seen. The ventral thecal sac is nearly effaced. The foramina are narrowed. C6-7: Small disc osteophyte complex effaces the ventral thecal sac. Uncovertebral disease appears worse on the left there is left worse than right foraminal narrowing. C7-T1: Minimal disc bulge without central canal or foraminal stenosis. IMPRESSION: Trace anterolisthesis C3 on C4 seen on comparison CT scan is due to facet degenerative disease. No evidence of posttraumatic change is present on this examination. Mild  multilevel disc bulging as described above most notable at C5-6 and C6-7 where the ventral thecal sac is effaced. There appears to be multilevel foraminal narrowing. Evaluation is limited by patient motion. Electronically Signed   By: Inge Rise M.D.   On: 03/23/2015 13:07   US Renal  03/24/2015  CLINICAL DATA:  Acute renal insufficiency EXAM: RENAL / URINARY TRACT ULTRASOUND COMPLETE COMPARISON:  None. FINDINGS: Right Kidney: Length: 14 cm in length. There is cortical thinning probable due to atrophy. No hydronephrosis. No renal calculus. Left Kidney: Not identified due to patient's large body habitus. Bladder: There is a Foley catheter within decompressed urinary bladder. IMPRESSION: No right hydronephrosis. Cortical thinning probable due to atrophy. There left kidney is not identified. Foley catheter within decompressed urinary bladder. Electronically Signed   By: Lahoma Crocker M.D.   On: 03/24/2015 18:34   Dg Chest Port 1 View  04/17/2015  CLINICAL DATA:  Shortness of breath today. EXAM: PORTABLE CHEST 1 VIEW COMPARISON:  Single view of the chest 04/13/2015, 03/23/2015 08/16/2014. FINDINGS: There is cardiomegaly and vascular congestion without frank edema. Left basilar atelectasis is noted. No pneumothorax. Aortic atherosclerosis is seen. IMPRESSION: Cardiomegaly and pulmonary vascular congestion. Electronically Signed   By: Inge Rise M.D.   On: 04/17/2015 08:02   Dg Chest Port 1 View  04/13/2015  CLINICAL DATA:  81 year old male with acute shortness of breath. EXAM: PORTABLE CHEST 1 VIEW COMPARISON:  03/23/2015 and prior chest radiographs FINDINGS: Cardiomegaly and mild pulmonary vascular congestion noted. Bibasilar opacities likely represent atelectasis. There is no evidence of pneumothorax. No acute bony abnormalities are identified. IMPRESSION: Cardiomegaly, mild pulmonary vascular congestion and probable bibasilar atelectasis. Electronically Signed   By: Margarette Canada M.D.   On:  04/13/2015 17:41   Dg Chest Port 1 View  03/23/2015  CLINICAL DATA:  Shortness of breath, acute renal failure. EXAM: PORTABLE CHEST 1 VIEW COMPARISON:  December 24, 2014. FINDINGS: Stable cardiomegaly is noted. No pneumothorax or significant pleural effusion is noted. Mild diffuse interstitial densities are noted throughout both lungs, right greater than left, concerning for mild pulmonary edema. Stable mild central pulmonary vascular congestion is noted. Bony thorax is unremarkable. IMPRESSION: Stable cardiomegaly and central pulmonary vascular congestion is noted, with mild diffuse interstitial densities throughout both lungs concerning for pulmonary edema. Electronically Signed   By: Marijo Conception, M.D.   On: 03/23/2015 07:46   Dg Knee Complete 4 Views Right  03/22/2015  CLINICAL DATA:  Initial evaluation for acute trauma, fall. EXAM: RIGHT KNEE - COMPLETE 4+ VIEW COMPARISON:  None. FINDINGS: Diffuse osteopenia present, somewhat limiting evaluation for subtle nondisplaced fractures. No acute fracture or dislocation identified. No joint effusion. Severe  tricompartmental degenerative osteoarthrosis. Prepatellar soft tissue swelling. IMPRESSION: 1. No acute fracture or dislocation. 2. Prepatellar soft tissue swelling. 3. Severe tricompartmental degenerative osteoarthrosis. 4. Diffuse osteopenia. Electronically Signed   By: Jeannine Boga M.D.   On: 03/22/2015 21:15   Dg Hand Complete Left  03/22/2015  CLINICAL DATA:  Status post fall at home while walking. Left hand swelling. Initial encounter. EXAM: LEFT HAND - COMPLETE 3+ VIEW COMPARISON:  None. FINDINGS: There is no evidence of fracture or dislocation. Joint space narrowing is noted at the first carpometacarpal joint, and there is mild diffuse osteopenia of visualized osseous structures. The carpal rows are intact, and demonstrate normal alignment. Diffuse soft tissue swelling is noted about the wrist. IMPRESSION: No evidence of fracture or  dislocation. Electronically Signed   By: Garald Balding M.D.   On: 03/22/2015 21:17   Dg Hand Complete Right  03/22/2015  CLINICAL DATA:  Patient fell at home while walking. Swelling in both hands. EXAM: RIGHT HAND - COMPLETE 3+ VIEW COMPARISON:  None. FINDINGS: Degenerative changes throughout the joints of the right hand and wrist. No evidence of acute fracture or subluxation. No focal bone lesion or bone destruction. Bone cortex and trabecular architecture appear intact. No radiopaque soft tissue foreign bodies. IMPRESSION: Prominent diffuse degenerative changes throughout the right hand and wrist. No acute fractures identified. Electronically Signed   By: Lucienne Capers M.D.   On: 03/22/2015 21:16    CBC  Recent Labs Lab 04/13/15 1636 04/15/15 0340 04/17/15 0415 04/18/15 0735  WBC 5.4 4.1 4.9 5.0  HGB 8.1* 8.1* 8.6* 8.3*  HCT 27.8* 27.0* 29.1* 27.6*  PLT 101* 84* 67* 70*  MCV 94.9 94.7 95.7 96.2  MCH 27.6 28.4 28.3 28.9  MCHC 29.1* 30.0 29.6* 30.1  RDW 24.1* 23.9* 23.6* 23.5*    Chemistries   Recent Labs Lab 04/14/15 0320 04/15/15 0340 04/16/15 0501 04/17/15 0415 04/18/15 0735  NA 141 140 139 140 139  K 4.2 3.7 3.6 3.3* 3.2*  CL 105 103 100* 101 99*  CO2 26 26 28 30 31   GLUCOSE 116* 107* 124* 120* 120*  BUN 57* 57* 59* 60* 60*  CREATININE 2.63* 2.58* 2.55* 2.43* 2.33*  CALCIUM 8.9 9.0 9.0 9.4 9.2  AST  --   --  26 25 21   ALT  --   --  17 17 14*  ALKPHOS  --   --  64 67 59  BILITOT  --   --  2.3* 3.1* 1.9*   ------------------------------------------------------------------------------------------------------------------ estimated creatinine clearance is 40.1 mL/min (by C-G formula based on Cr of 2.33). ------------------------------------------------------------------------------------------------------------------ No results for input(s): HGBA1C in the last 72  hours. ------------------------------------------------------------------------------------------------------------------ No results for input(s): CHOL, HDL, LDLCALC, TRIG, CHOLHDL, LDLDIRECT in the last 72 hours. ------------------------------------------------------------------------------------------------------------------  Recent Labs  04/16/15 1610  TSH 1.034   ------------------------------------------------------------------------------------------------------------------ No results for input(s): VITAMINB12, FOLATE, FERRITIN, TIBC, IRON, RETICCTPCT in the last 72 hours.  Coagulation profile No results for input(s): INR, PROTIME in the last 168 hours.  No results for input(s): DDIMER in the last 72 hours.  Cardiac Enzymes No results for input(s): CKMB, TROPONINI, MYOGLOBIN in the last 168 hours.  Invalid input(s): CK ------------------------------------------------------------------------------------------------------------------ Invalid input(s): [place]  04/17/15 0731 04/17/15 1203 04/17/15 1625 04/17/15 2057 04/18/15 0732 04/18/15 Gurley M.D. [facility] 04/18/2015, 12:14 PM  Pager: [phone] Between 7am to 7pm - call Pager - [phone]  After 7pm go  to [email or web address] - password [email or web address]  Call night coverage person covering after 7pm

## 2015-04-18 NOTE — Progress Notes (Signed)
pt is on BiPAP at this time tolerating it well.

## 2015-04-18 NOTE — Progress Notes (Signed)
PT Cancellation Note  Patient Details Name: HAJI GALLUCCIO MRN: [id number] DOB: 15-Feb-1932   Cancelled Treatment:    Reason Eval/Treat Not Completed: Patient declined, no reason specified.  PT will continue to follow and will attempt to see pt again later today, time permitting.  Joslyn Hy PT, DPT [phone] Pager: [phone] 04/18/2015, 1:44 PM

## 2015-04-18 NOTE — Progress Notes (Addendum)
Daily Progress Note   Patient Name: [name]       Date: 04/18/2015 DOB: 05/20/1931  Age: 81 y.o. MRN#: [id number] Attending Physician: Mendel Corning, MD Primary Care Physician: Wende Neighbors, MD Admit Date: 04/13/2015  Reason for Consultation/Follow-up: Establishing goals of care  Subjective: I met with Mr. Sposito this afternoon. He is lying in bed and eating a popsicle. He did seem to be somewhat confused about everything surrounding his medical care, but was able to talk with me about his grandchildren, which is where he finds his joy.  This is my first encounter with patient, however it appears his mental status is improved when compared with notes from yesterday.  Length of Stay: 5 days  Current Medications: Scheduled Meds:  . antiseptic oral rinse  7 mL Mouth Rinse q12n4p  . calcium-vitamin D  1 tablet Oral BID  . chlorhexidine  15 mL Mouth Rinse BID  . furosemide  80 mg Intravenous BID  . insulin aspart  0-5 Units Subcutaneous QHS  . insulin aspart  0-9 Units Subcutaneous TID WC  . labetalol  200 mg Oral BID  . lactulose  20 g Oral TID  . latanoprost  1 drop Right Eye QHS  . loratadine  10 mg Oral Daily  . multivitamin with minerals  1 tablet Oral Daily  . sertraline  25 mg Oral QHS  . sodium chloride  3 mL Intravenous Q12H  . spironolactone  25 mg Oral Daily  . tamsulosin  0.4 mg Oral QHS  . vitamin B-12  1,000 mcg Oral Daily    Continuous Infusions:    PRN Meds: sodium chloride, acetaminophen, ondansetron (ZOFRAN) IV, sodium chloride  Physical Exam: Physical Exam        General: Alert, awake, in no acute distress.  HEENT: No bruits, no goiter. Heart: Irregular. No murmur appreciated. Lungs: Fair air movement, clear Abdomen: Soft, nontender, obese,  positive bowel sounds.  Ext: Bilateral edema Skin: Warm and dry Neuro: Grossly intact, nonfocal.        Vital Signs: BP 110/59 mmHg  Pulse 81  Temp(Src) 98.4 F (36.9 C) (Oral)  Resp 19  Ht '6\' 1"'$  (1.854 m)  Wt 165.336 kg (364 lb 8 oz)  BMI 48.10 kg/m2  SpO2 92% SpO2: SpO2: 92 % O2 Device: O2 Device:  Nasal Cannula O2 Flow Rate: O2 Flow Rate (L/min): 2 L/min  Intake/output summary:  Intake/Output Summary (Last 24 hours) at 04/18/15 1908 Last data filed at 04/18/15 1700  Gross per 24 hour  Intake   1083 ml  Output   4325 ml  Net  -3242 ml   LBM: Last BM Date: 04/17/15 Baseline Weight: Weight: (!) 166.47 kg (367 lb) Most recent weight: Weight: (!) 165.336 kg (364 lb 8 oz)       Palliative Assessment/Data: Flowsheet Rows        Most Recent Value   Intake Tab    Referral Department  Cardiology   Unit at Time of Referral  Cardiac/Telemetry Unit   Palliative Care Primary Diagnosis  Cardiac   Date Notified  04/16/15   Palliative Care Type  New Palliative care   Reason for referral  Clarify Goals of Care   Date of Admission  04/13/15   Date first seen by Palliative Care  04/17/15   # of days Palliative referral response time  0 Day(s)   # of days IP prior to Palliative referral  3   Clinical Assessment    Palliative Performance Scale Score  20%   Pain Max last 24 hours  4   Pain Min Last 24 hours  3   Dyspnea Max Last 24 Hours  5   Dyspnea Min Last 24 hours  4   Psychosocial & Spiritual Assessment    Palliative Care Outcomes    Patient/Family meeting held?  Yes   Who was at the meeting?  wife daughter grand daughter    Palliative Care Outcomes  Clarified goals of care, Counseled regarding hospice   Palliative Care follow-up planned  Yes, Facility      Additional Data Reviewed: CBC    Component Value Date/Time   WBC 5.0 04/18/2015 0735   WBC 4.1 02/26/2015 1318   RBC 2.87* 04/18/2015 0735   RBC 3.07* 02/26/2015 1318   RBC 3.11* 08/17/2014 1151   HGB 8.3*  04/18/2015 0735   HGB 8.3* 02/26/2015 1318   HCT 27.6* 04/18/2015 0735   HCT 26.9* 02/26/2015 1318   PLT 70* 04/18/2015 0735   PLT 100* 02/26/2015 1318   MCV 96.2 04/18/2015 0735   MCV 87.6 02/26/2015 1318   MCH 28.9 04/18/2015 0735   MCH 27.0* 02/26/2015 1318   MCHC 30.1 04/18/2015 0735   MCHC 30.9* 02/26/2015 1318   RDW 23.5* 04/18/2015 0735   RDW 17.1* 02/26/2015 1318   LYMPHSABS 1.0 03/23/2015 0644   LYMPHSABS 0.7* 02/26/2015 1318   MONOABS 0.6 03/23/2015 0644   MONOABS 0.7 02/26/2015 1318   EOSABS 0.2 03/23/2015 0644   EOSABS 0.3 02/26/2015 1318   BASOSABS 0.0 03/23/2015 0644   BASOSABS 0.0 02/26/2015 1318    CMP     Component Value Date/Time   NA 139 04/18/2015 0735   NA 141 02/26/2015 1318   K 3.2* 04/18/2015 0735   K 4.1 02/26/2015 1318   CL 99* 04/18/2015 0735   CO2 31 04/18/2015 0735   CO2 23 02/26/2015 1318   GLUCOSE 120* 04/18/2015 0735   GLUCOSE 126 02/26/2015 1318   BUN 60* 04/18/2015 0735   BUN 48.4* 02/26/2015 1318   CREATININE 2.33* 04/18/2015 0735   CREATININE 2.3* 02/26/2015 1318   CALCIUM 9.2 04/18/2015 0735   CALCIUM 9.5 02/26/2015 1318   PROT 5.1* 04/18/2015 0735   PROT 6.5 02/26/2015 1318   ALBUMIN 2.7* 04/18/2015 0735   ALBUMIN 3.7 02/26/2015 1318  AST 21 04/18/2015 0735   AST 16 02/26/2015 1318   ALT 14* 04/18/2015 0735   ALT 12 02/26/2015 1318   ALKPHOS 59 04/18/2015 0735   ALKPHOS 77 02/26/2015 1318   BILITOT 1.9* 04/18/2015 0735   BILITOT 1.15 02/26/2015 1318   GFRNONAA 25* 04/18/2015 0735   GFRAA 29* 04/18/2015 0735       Problem List:  Patient Active Problem List   Diagnosis Date Noted  . Encounter for palliative care   . IDDM (insulin dependent diabetes mellitus) ([place]) 04/13/2015  . Atrial fibrillation ([place]) 04/13/2015  . OSA treated with BiPAP 04/13/2015  . SOB (shortness of breath)   . AKI (acute kidney injury) ([place])   . Acute on chronic renal insufficiency ([place]) 03/23/2015  . Chronic anemia 03/23/2015  . Guaiac  positive stools 03/23/2015  . Thrombocytopenia ([place]) 03/23/2015  . Renal failure (ARF), acute on chronic ([facility]) 03/23/2015  . Pressure ulcer, stage 2, left posterior thigh at crease of butt 08/20/2014  . Sleep apnea- on C-pap 08/19/2014  . Acute on chronic diastolic CHF (congestive heart failure), NYHA class 1 ([place]) 08/17/2014  . Acute on chronic diastolic CHF (congestive heart failure) ([place]) 08/16/2014  . Blindness of left eye 04/15/2013  . CKD (chronic kidney disease) stage 3, GFR 30-59 ml/min 04/14/2013  . Chronic pain syndrome 08/18/2012  . Chronic diastolic heart failure ([place]) 07/15/2012  . Acute on chronic diastolic heart failure ([place]) 10/08/2010  . Morbid obesity-BMI 51 05/30/2009  . Essential hypertension, benign 05/30/2009  . CAD- mild non obstructive Oct 2006 02/03/2009  . SVT/ PSVT/ PAT 02/03/2009  . Mixed hyperlipidemia 12/26/2008     Palliative Care Assessment & Plan    1.Code Status:  DNR    Code Status Orders        Start     Ordered   04/17/15 7510  Do not attempt resuscitation (DNR)   Continuous    Question Answer Comment  In the event of cardiac or respiratory ARREST Do not call a "code blue"   In the event of cardiac or respiratory ARREST Do not perform Intubation, CPR, defibrillation or ACLS   In the event of cardiac or respiratory ARREST Use medication by any route, position, wound care, and other measures to relive pain and suffering. May use oxygen, suction and manual treatment of airway obstruction as needed for comfort.      04/17/15 1632       2. Goals of Care/Additional Recommendations: Code discussions: DNR DNI Goals of care: continue current scope of treatment: attempts at diuresis, monitor renal function. If no clinical improvement, family is agreeable to complete comfort care. At that point, patient may be transferred to [facility]. Family does not want hospice involved, does not want to send the patient to hospice home.  - Follow-up tomorrow.    3. Symptom Management:      1. Denies any active symptoms during our encounter  4. Palliative Prophylaxis:   Bowel Regimen and Delirium Protocol  5. Prognosis: Unable to determine  6. Discharge Planning:  To be determined   Care plan was discussed with patient  Thank you for allowing the Palliative Medicine Team to assist in the care of this patient.   Time In: 1505 Time Out: 1520 Total Time 15 Prolonged Time Billed no        Micheline Rough, MD  04/18/2015, 7:08 PM  Please contact Palliative Medicine Team phone at [phone] for questions and concerns.
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
## 2015-04-18 NOTE — Clinical Social Work Note (Signed)
CSW contacted patient's son Jearld Fenton to update on bed situation. At this time, patient has bed offers from [facility], [facility], and [facility]. Jearld Fenton will discuss the options with the patient's wife. Jearld Fenton states that PMT will be getting involved to help the family discuss goals of care. CSW informed Jearld Fenton that the patient can receive palliative services at SNF if the family decides to pursue this option. CSW will continue to follow.     Liz Beach MSW, [place], [place], JI:[id number]

## 2015-04-18 NOTE — Progress Notes (Signed)
At 02:15, rounded on patient to determine if he has had results from lactulose enema. No BM yet. Patient states he is comfortable left-side lying for the time being. SPO2 is 96% on O2 at 2L/m per nasal canula.  Continuing to monitor.

## 2015-04-18 NOTE — Progress Notes (Signed)
Patient continuing to experience apneic episodes with desaturation to 70% range. Couplet PVCs and runs on telemetry.  Acute confusion with lethargy has seemed to come and go overnight several times, but is not dependent on oxygenation status alone.  Placed patient back on CPAP with O2 bled-in at 2 L/min and raised HOB to 60 degrees. At several minutes, he is less confused and is much more alert; talking in complete sentences. He is asking about food and drink, placing phone calls to family and results of testing.  Provided oral care and lip moisturizer for comfort.  Had a very good discussion with patient concerning plan of care, though I doubt he will retain much of this.  Discussed patient status briefly with Forrest Moron, NP on-call for [facility]. At this time patient is not ready to advance diet safely, after all, due to waxing and waning mental status. NP will communicate to medical team this AM; deferring to rounding MD at this time.

## 2015-04-18 NOTE — Progress Notes (Signed)
At 01:40, arrived to bedside with charge RN, Johny Shock and NT, Virl Axe to assist with lactulose enema per orders. Patient is currently alert, oriented to person and place and conversant. He had removed his CPAP; SPO2 89% on room air. Replaced O2 at 2L/m via nasal canula with brisk return of SPO2 to 93 - 94%.  Explained reasoning for enema to patient. He is concerned about colonoscopy that he has had in the past, stating that he has had bowel problems ever since. He also states he is hungry and thirsty. Further explained that he is currently NPO related to his risk for aspiration, given his recent lethargic and confused status. Assured him that as soon as it is safe for him to eat and drink, that nursing will resume his diet. He grudgingly verbalized understanding.  Lactulose enema delivered slowly over 10 minutes per rectum. Patient tolerated well.  Patient is comfortably left-side lying at this time with no active complaints. Have instructed him remain on his side for 10 - 20 minutes and to expect multiple bowel movements.  Continuing to monitor.

## 2015-04-18 NOTE — Consult Note (Signed)
   [facility] CM Inpatient Consult   04/18/2015  CODA CHOTO [birth year] [id number]   Patient has been active with [facility]. Please see details in chart review tab then notes for [facility] correspondence with patient. Noted patient likely to go to SNF at discharge. Mr. Dowty states he wants [facility] RNCM to continue to follow him post discharge. He states he appreciates Delrae Rend communicating with his doctors and the like on his behalf. Will continue to follow. Inpatient RNCM aware [facility] is following.   Marthenia Rolling, MSN-Ed, RN,BSN [facility] Liaison [phone]

## 2015-04-19 DIAGNOSIS — Z515 Encounter for palliative care: Secondary | ICD-10-CM | POA: Insufficient documentation

## 2015-04-19 LAB — BASIC METABOLIC PANEL
ANION GAP: 10 (ref 5–15)
BUN: 56 mg/dL — AB (ref 6–20)
CALCIUM: 9.4 mg/dL (ref 8.9–10.3)
CO2: 32 mmol/L (ref 22–32)
CREATININE: 2.15 mg/dL — AB (ref 0.61–1.24)
Chloride: 98 mmol/L — ABNORMAL LOW (ref 101–111)
GFR calc Af Amer: 31 mL/min — ABNORMAL LOW (ref >60)
GFR, EST NON AFRICAN AMERICAN: 27 mL/min — AB (ref >60)
GLUCOSE: 113 mg/dL — AB (ref 65–99)
Potassium: 3.3 mmol/L — ABNORMAL LOW (ref 3.5–5.1)
Sodium: 140 mmol/L (ref 135–145)

## 2015-04-19 LAB — GLUCOSE, CAPILLARY
Glucose-Capillary: 103 mg/dL — ABNORMAL HIGH (ref 65–99)
Glucose-Capillary: 159 mg/dL — ABNORMAL HIGH (ref 65–99)
Glucose-Capillary: 118 mg/dL — ABNORMAL HIGH (ref 65–99)
Glucose-Capillary: 139 mg/dL — ABNORMAL HIGH (ref 65–99)
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
## 2015-04-19 NOTE — Progress Notes (Signed)
Physical Therapy Treatment Patient Details Name: [name] MRN: [id number] DOB: 1932-03-27 Today's Date: 04/19/2015    History of Present Illness This 81 y.o male admitted from cardiologist's office with worsening SOB, increased edema bil. LEs due to acute on chronic CHF.  PMH includes:  Stage III CKD, A-Fib, IDDM, HTN, OSA, lymphedema, chronic Lt LE edema, morbid obesity     PT Comments    Pt awake and motivated to participate with Pt today.  Pt able to take a few steps towards [facility] with 2 person A, but overall mobility limited by weakness and fatigue.  Continue to feel pt will need SNF at D/C.    Follow Up Recommendations  SNF     Equipment Recommendations  None recommended by PT    Recommendations for Other Services       Precautions / Restrictions Precautions Precautions: Fall Precaution Comments: O2 sats Restrictions Weight Bearing Restrictions: No    Mobility  Bed Mobility Overal bed mobility: Needs Assistance;+2 for physical assistance Bed Mobility: Supine to Sit;Sit to Supine     Supine to sit: Mod assist;+2 for physical assistance Sit to supine: Mod assist;+2 for physical assistance   General bed mobility comments: heavy use of bed rails  Transfers Overall transfer level: Needs assistance Equipment used: Rolling walker (2 wheeled) Transfers: Sit to/from Stand Sit to Stand: Mod assist;+2 physical assistance         General transfer comment: cues for UE use and A for power up to standing.  3rd person present, but not necessary.  Ambulation/Gait Ambulation/Gait assistance: Min assist;+2 physical assistance Ambulation Distance (Feet):  (3 steps to [facility]) Assistive device: Rolling walker (2 wheeled)       General Gait Details: pt needs increased time to weight shift and move each LE.  A with moving RW.     Stairs            Wheelchair Mobility    Modified Rankin (Stroke Patients Only)       Balance Overall balance assessment: Needs  assistance Sitting-balance support: Bilateral upper extremity supported;Feet supported Sitting balance-Leahy Scale: Fair     Standing balance support: Bilateral upper extremity supported;During functional activity Standing balance-Leahy Scale: Poor                      Cognition Arousal/Alertness: Awake/alert Behavior During Therapy: WFL for tasks assessed/performed Overall Cognitive Status: Impaired/Different from baseline Area of Impairment: Orientation;Attention;Memory;Following commands;Safety/judgement;Awareness;Problem solving Orientation Level: Disoriented to;Time;Situation Current Attention Level: Selective Memory: Decreased short-term memory Following Commands: Follows one step commands consistently Safety/Judgement: Decreased awareness of safety;Decreased awareness of deficits Awareness: Intellectual Problem Solving: Slow processing      Exercises      General Comments        Pertinent Vitals/Pain Pain Assessment: No/denies pain    Home Living                      Prior Function            PT Goals (current goals can now be found in the care plan section) Acute Rehab PT Goals Patient Stated Goal: none stated PT Goal Formulation: Patient unable to participate in goal setting Time For Goal Achievement: 05/06/15 Potential to Achieve Goals: Fair Progress towards PT goals: Progressing toward goals    Frequency  Min 2X/week    PT Plan Current plan remains appropriate    Co-evaluation  End of Session Equipment Utilized During Treatment: Gait belt;Oxygen Activity Tolerance: Patient limited by fatigue Patient left: in bed;with call bell/phone within reach;with bed alarm set     Time: [id number] PT Time Calculation (min) (ACUTE ONLY): 30 min  Charges:  $Gait Training: 8-22 mins $Therapeutic Activity: 8-22 mins                    G CodesCatarina Hartshorn, [place] 04/19/2015, 2:42 PM

## 2015-04-19 NOTE — Progress Notes (Signed)
Daily Progress Note   Patient Name: [name]       Date: 04/19/2015 DOB: [birth year]  Age: 81 y.o. MRN#: [id number] Attending Physician: Mendel Corning, MD Primary Care Physician: Wende Neighbors, MD Admit Date: 04/13/2015  Reason for Consultation/Follow-up: Establishing goals of care  Subjective: I met with [name] this afternoon. He is lying in bed and reports feeling well this afternoon. Length of Stay: 6 days  Current Medications: Scheduled Meds:  . antiseptic oral rinse  7 mL Mouth Rinse q12n4p  . calcium-vitamin D  1 tablet Oral BID  . chlorhexidine  15 mL Mouth Rinse BID  . furosemide  80 mg Intravenous BID  . insulin aspart  0-5 Units Subcutaneous QHS  . insulin aspart  0-9 Units Subcutaneous TID WC  . labetalol  200 mg Oral BID  . lactulose  20 g Oral TID  . latanoprost  1 drop Right Eye QHS  . loratadine  10 mg Oral Daily  . multivitamin with minerals  1 tablet Oral Daily  . sertraline  25 mg Oral QHS  . sodium chloride  3 mL Intravenous Q12H  . spironolactone  25 mg Oral Daily  . tamsulosin  0.4 mg Oral QHS  . vitamin B-12  1,000 mcg Oral Daily    Continuous Infusions:    PRN Meds: sodium chloride, acetaminophen, ondansetron (ZOFRAN) IV, sodium chloride  Physical Exam: Physical Exam        General: Alert, awake, in no acute distress.  HEENT: No bruits, no goiter. Heart: Irregular. No murmur appreciated. Lungs: Fair air movement, clear Abdomen: Soft, nontender, obese, positive bowel sounds.  Ext: Bilateral edema Skin: Warm and dry Neuro: Grossly intact, nonfocal.        Vital Signs: BP 106/48 mmHg  Pulse 67  Temp(Src) 98.5 F (36.9 C) (Oral)  Resp 18  Ht 6' 1" (1.854 m)  Wt 163.25 kg (359 lb 14.4 oz)  BMI 47.49 kg/m2  SpO2 95% SpO2: SpO2: 95  % O2 Device: O2 Device: Nasal Cannula O2 Flow Rate: O2 Flow Rate (L/min): 3 L/min  Intake/output summary:   Intake/Output Summary (Last 24 hours) at 04/19/15 1824 Last data filed at 04/19/15 1742  Gross per 24 hour  Intake    723 ml  Output   3025 ml  Net  -  2302 ml   LBM: Last BM Date: 04/18/15 Baseline Weight: Weight: (!) 166.47 kg (367 lb) Most recent weight: Weight: (!) 163.25 kg (359 lb 14.4 oz)       Palliative Assessment/Data: Flowsheet Rows        Most Recent Value   Intake Tab    Referral Department  Cardiology   Unit at Time of Referral  Cardiac/Telemetry Unit   Palliative Care Primary Diagnosis  Cardiac   Date Notified  04/16/15   Palliative Care Type  New Palliative care   Reason for referral  Clarify Goals of Care   Date of Admission  04/13/15   Date first seen by Palliative Care  04/17/15   # of days Palliative referral response time  0 Day(s)   # of days IP prior to Palliative referral  3   Clinical Assessment    Palliative Performance Scale Score  20%   Pain Max last 24 hours  4   Pain Min Last 24 hours  3   Dyspnea Max Last 24 Hours  5   Dyspnea Min Last 24 hours  4   Psychosocial & Spiritual Assessment    Palliative Care Outcomes    Patient/Family meeting held?  Yes   Who was at the meeting?  wife daughter grand daughter    Palliative Care Outcomes  Clarified goals of care, Counseled regarding hospice   Palliative Care follow-up planned  Yes, Facility      Additional Data Reviewed: CBC    Component Value Date/Time   WBC 5.0 04/18/2015 0735   WBC 4.1 02/26/2015 1318   RBC 2.87* 04/18/2015 0735   RBC 3.07* 02/26/2015 1318   RBC 3.11* 08/17/2014 1151   HGB 8.3* 04/18/2015 0735   HGB 8.3* 02/26/2015 1318   HCT 27.6* 04/18/2015 0735   HCT 26.9* 02/26/2015 1318   PLT 70* 04/18/2015 0735   PLT 100* 02/26/2015 1318   MCV 96.2 04/18/2015 0735   MCV 87.6 02/26/2015 1318   MCH 28.9 04/18/2015 0735   MCH 27.0* 02/26/2015 1318   MCHC 30.1  04/18/2015 0735   MCHC 30.9* 02/26/2015 1318   RDW 23.5* 04/18/2015 0735   RDW 17.1* 02/26/2015 1318   LYMPHSABS 1.0 03/23/2015 0644   LYMPHSABS 0.7* 02/26/2015 1318   MONOABS 0.6 03/23/2015 0644   MONOABS 0.7 02/26/2015 1318   EOSABS 0.2 03/23/2015 0644   EOSABS 0.3 02/26/2015 1318   BASOSABS 0.0 03/23/2015 0644   BASOSABS 0.0 02/26/2015 1318    CMP     Component Value Date/Time   NA 140 04/19/2015 0338   NA 141 02/26/2015 1318   K 3.3* 04/19/2015 0338   K 4.1 02/26/2015 1318   CL 98* 04/19/2015 0338   CO2 32 04/19/2015 0338   CO2 23 02/26/2015 1318   GLUCOSE 113* 04/19/2015 0338   GLUCOSE 126 02/26/2015 1318   BUN 56* 04/19/2015 0338   BUN 48.4* 02/26/2015 1318   CREATININE 2.15* 04/19/2015 0338   CREATININE 2.3* 02/26/2015 1318   CALCIUM 9.4 04/19/2015 0338   CALCIUM 9.5 02/26/2015 1318   PROT 5.1* 04/18/2015 0735   PROT 6.5 02/26/2015 1318   ALBUMIN 2.7* 04/18/2015 0735   ALBUMIN 3.7 02/26/2015 1318   AST 21 04/18/2015 0735   AST 16 02/26/2015 1318   ALT 14* 04/18/2015 0735   ALT 12 02/26/2015 1318   ALKPHOS 59 04/18/2015 0735   ALKPHOS 77 02/26/2015 1318   BILITOT 1.9* 04/18/2015 0735   BILITOT 1.15 02/26/2015 1318  GFRNONAA 27* 04/19/2015 0338   GFRAA 31* 04/19/2015 0338       Problem List:  Patient Active Problem List   Diagnosis Date Noted  . Altered mental state   . Encounter for palliative care   . IDDM (insulin dependent diabetes mellitus) ([place]) 04/13/2015  . Atrial fibrillation ([place]) 04/13/2015  . OSA treated with BiPAP 04/13/2015  . SOB (shortness of breath)   . AKI (acute kidney injury) ([place])   . Acute on chronic renal insufficiency ([place]) 03/23/2015  . Chronic anemia 03/23/2015  . Guaiac positive stools 03/23/2015  . Thrombocytopenia ([place]) 03/23/2015  . Renal failure (ARF), acute on chronic ([facility]) 03/23/2015  . Pressure ulcer, stage 2, left posterior thigh at crease of butt 08/20/2014  . Sleep apnea- on C-pap 08/19/2014  . Acute on  chronic diastolic CHF (congestive heart failure), NYHA class 1 ([place]) 08/17/2014  . Acute on chronic diastolic CHF (congestive heart failure) ([place]) 08/16/2014  . Blindness of left eye 04/15/2013  . CKD (chronic kidney disease) stage 3, GFR 30-59 ml/min 04/14/2013  . Chronic pain syndrome 08/18/2012  . Chronic diastolic heart failure ([place]) 07/15/2012  . Acute on chronic diastolic heart failure ([place]) 10/08/2010  . Morbid obesity-BMI 51 05/30/2009  . Essential hypertension, benign 05/30/2009  . CAD- mild non obstructive Oct 2006 02/03/2009  . SVT/ PSVT/ PAT 02/03/2009  . Mixed hyperlipidemia 12/26/2008     Palliative Care Assessment & Plan    1.Code Status:  DNR    Code Status Orders        Start     Ordered   04/17/15 3382  Do not attempt resuscitation (DNR)   Continuous    Question Answer Comment  In the event of cardiac or respiratory ARREST Do not call a "code blue"   In the event of cardiac or respiratory ARREST Do not perform Intubation, CPR, defibrillation or ACLS   In the event of cardiac or respiratory ARREST Use medication by any route, position, wound care, and other measures to relive pain and suffering. May use oxygen, suction and manual treatment of airway obstruction as needed for comfort.      04/17/15 1632       2. Goals of Care/Additional Recommendations: Code discussions: DNR DNI Goals of care: continue current scope of treatment: attempts at diuresis, monitor renal function. If no clinical improvement, family is agreeable to complete comfort care. At this point, it appears to me that the patient continues to improve clinically and has been diuresing well.  - Follow-up tomorrow.   3. Symptom Management:      1. Denies any active symptoms during our encounter  4. Palliative Prophylaxis:   Bowel Regimen and Delirium Protocol  5. Prognosis: Unable to determine, but he appears to be clinically improving  6. Discharge Planning:  [facility] for rehab with Palliative care service follow-up most likely   Care plan was discussed with patient  Thank you for allowing the Palliative Medicine Team to assist in the care of this patient.   Time In: 5053 Time Out: 1510 Total Time 15 Prolonged Time Billed no        Micheline Rough, MD  04/19/2015, 6:24 PM  Please contact Palliative Medicine Team phone at [phone] for questions and concerns.

## 2015-04-19 NOTE — Progress Notes (Signed)
[facility]                                                                              Patient Demographics  [name], is a 81 y.o. male, DOB - 1931/11/13, [id number]  Admit date - 04/13/2015   Admitting Physician Theressa Millard, MD  Outpatient Primary MD for the patient is Wende Neighbors, MD  LOS - 6   Chief Complaint  Patient presents with  . Leg Swelling  . Congestive Heart Failure       Brief HPI   81 y.o. male with a history of Diastolic CHF, Stage III CKD, Atrial Fibrillation, IDDM, HTN. OSA, Lymphedema, chronci leg edema who presents to the ED with complaints of worsening SOB, and edema of both legs for quite some time. -He was seen by his Cardiologist Dr. Domenic Polite today and advised to go to the ED for hospitalization due to acute on chronic CHF.   Assessment & Plan   Primary problem Acute on chronic diastolic CHF.  - 2-D echo shows EF of 60%, with grade 2 diastolic and elevated filling pressures. Severe pulmonary hypertensin.  -  Continue aggressive diuresis per cardiology recommendations - Continue beta blocker, patient is not a good candidate for ACE/ARB due to renal issues.  Active problems Atrial fibrillation.  - Not a good candidate for Anticoagulant Rx. Per cardiology, complex patient, would not appear to be a good candidate for long-term anticoagulation because of his dementia and multiple comorbidities   IDDM.  - Hold Levemir due to borderline glucose. Hemoglobin A1c 5.9 .  - Continue sliding scale insulin    OSA treated with BiPAP. -  BIPAP qhs and PRN   Morbid obesity-BMI 51.  - Patient counseled on diet and weight control   CKD stage IV. Lymphedema, chronic leg edema. Complicated by renal insufficiency, atrial fibrillation, anemia, -  continue Lasix 80 mg IV BID, currently tolerating, baseline renal function 2.5-2.6   Acute encephalopathy: Mental status worsening since 12/20 with hyperammonemia - Patient received  lactulose enema this morning, placed on oral lactulose as he is much more alert and awake. - CT head with no mass or acute infarct or bleed.  - Palliative medicine also following, if no significant improvement in next 24-48 hours in overall status, family contemplating comfort measures  Code Status: DO NOT RESUSCITATE  Family Communication: Discussed in detail with the patient, all imaging results, lab results explained to the patient    Disposition Plan: - Palliative medicine also following, if no significant improvement in next 24-48 hours in overall status, family contemplating comfort measures   Time Spent in minutes  25 minutes  Procedures  2-D echo  Consults   Cardiology Palliative medicine  DVT Prophylaxis   SCD's   Medications  Scheduled Meds: . antiseptic oral rinse  7 mL Mouth Rinse q12n4p  . calcium-vitamin D  1 tablet Oral BID  . chlorhexidine  15 mL Mouth Rinse BID  . furosemide  80 mg Intravenous BID  . insulin aspart  0-5 Units Subcutaneous QHS  . insulin aspart  0-9 Units Subcutaneous TID WC  . labetalol  200 mg Oral  BID  . lactulose  20 g Oral TID  . latanoprost  1 drop Right Eye QHS  . loratadine  10 mg Oral Daily  . multivitamin with minerals  1 tablet Oral Daily  . sertraline  25 mg Oral QHS  . sodium chloride  3 mL Intravenous Q12H  . spironolactone  25 mg Oral Daily  . tamsulosin  0.4 mg Oral QHS  . vitamin B-12  1,000 mcg Oral Daily   Continuous Infusions:  PRN Meds:.sodium chloride, acetaminophen, ondansetron (ZOFRAN) IV, sodium chloride   Antibiotics   Anti-infectives    None        Subjective:   [name] was seen and examined today. Alert and awake and more oriented, eating breakfast.  Patient denies dizziness, chest pain, shortness of breath, abdominal pain, N/V/D/C, new weakness, numbess, tingling. No acute events overnight.    Objective:   Blood pressure 106/48, pulse 67, temperature 98.5 F (36.9 C), temperature source  Oral, resp. rate 18, height 6\' 1"  (1.854 m), weight 163.25 kg (359 lb 14.4 oz), SpO2 95 %.  Wt Readings from Last 3 Encounters:  04/19/15 163.25 kg (359 lb 14.4 oz)  04/13/15 166.47 kg (367 lb)  03/25/15 179.1 kg (394 lb 13.5 oz)     Intake/Output Summary (Last 24 hours) at 04/19/15 1248 Last data filed at 04/19/15 1045  Gross per 24 hour  Intake    603 ml  Output   2825 ml  Net  -2222 ml    Exam  General: Alert and oriented x 2, NAD, has dementia, pleasant and cooperative  HEENT:  PERRLA, EOMI, Anicteric Sclera, mucous membranes moist.   Neck: Supple, no JVD, no masses  CVS: S1 S2 auscultated, no rubs, murmurs or gallops. Regular rate and rhythm.  Respiratory: Mild expiratory wheezing  Abdomen: Morbidly obese, Soft, nontender, nondistended, + bowel sounds  Ext: no cyanosis clubbing, marked peripheral edema with lymphedema  Neuro: no new deficits  Skin: Lymphedema with chronic venous stasis changes  Psych: Normal affect and demeanor, alert and oriented 2   Data Review   Micro Results Recent Results (from the past 240 hour(s))  MRSA PCR Screening     Status: None   Collection Time: 04/13/15 10:31 PM  Result Value Ref Range Status   MRSA by PCR NEGATIVE NEGATIVE Final    Comment:        The GeneXpert MRSA Assay (FDA approved for NASAL specimens only), is one component of a comprehensive MRSA colonization surveillance program. It is not intended to diagnose MRSA infection nor to guide or monitor treatment for MRSA infections.     Radiology Reports Dg Pelvis 1-2 Views  03/23/2015  CLINICAL DATA:  Fall.  Right hip pain. EXAM: PELVIS - 1-2 VIEW COMPARISON:  None. FINDINGS: There is no evidence of pelvic fracture or diastasis. No pelvic bone lesions are seen. Minimal hypertrophic changes present at the superior acetabular margins in both hip joints. IMPRESSION: No pelvic fracture. Minimal osteoarthritis in the weight-bearing portions of both hip joints.  Electronically Signed   By: Ilona Sorrel M.D.   On: 03/23/2015 08:17   Ct Head Wo Contrast  04/17/2015  CLINICAL DATA:  Altered mental status.  Encephalopathy. EXAM: CT HEAD WITHOUT CONTRAST TECHNIQUE: Contiguous axial images were obtained from the base of the skull through the vertex without intravenous contrast. COMPARISON:  Head CT March 22, 2015 and brain MRI April 17, 2015 FINDINGS: There is stable mild generalized atrophy with slightly greater atrophy in the left  frontal and left temporal lobe regions than elsewhere. There is no intracranial mass, hemorrhage, extra-axial fluid collection, or midline shift. Mild small vessel disease in the centra semiovale bilaterally is stable. There is no new gray-white compartment lesion. No acute infarct evident on this study. The bony calvarium appears intact. There is chronic appearing mastoid disease bilaterally. There is a chronic defect in the left lamina papyracea with evidence of old maxillary antral region fractures on the left. There is mucosal thickening in the left maxillary antrum. 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 Right orbit appears normal. IMPRESSION: Atrophy with mild small vessel disease, stable. No hemorrhage, mass, or acute appearing infarct. Evidence of old left facial and left globe trauma. Chronic mastoid disease bilaterally. Electronically Signed   By: Lowella Grip III M.D.   On: 04/17/2015 14:59   Ct Head Wo Contrast  03/22/2015  CLINICAL DATA:  Trip and fall injury, subsequent patient fell from stretcher onto the left side. Now with head and neck pain. Examination is limited as the patient had difficulty fitting into the gantry and was short of breath while laying flat. EXAM: CT HEAD WITHOUT CONTRAST CT CERVICAL SPINE WITHOUT CONTRAST TECHNIQUE: Multidetector CT imaging of the head and cervical spine was performed following the standard protocol without intravenous contrast. Multiplanar  CT image reconstructions of the cervical spine were also generated. COMPARISON:  CT head 12/24/2014 FINDINGS: CT HEAD FINDINGS Diffuse cerebral atrophy. Mild ventricular dilatation consistent with central atrophy. Low-attenuation changes in the deep white matter consistent with small vessel ischemia. Basal ganglia calcifications. No mass effect or midline shift. No abnormal extra-axial fluid collections. Gray-white matter junctions are distinct. Basal cisterns are not effaced. No evidence of acute intracranial hemorrhage. No depressed skull fractures. Opacification of left maxillary antrum and some of the bilateral ethmoid air cells. Opacification of some of the mastoid air cells bilaterally. Calcification and deformity of the left globe. Vascular calcifications. CT CERVICAL SPINE FINDINGS Cervical spine examination is significantly limited due to motion artifact. There is mild anterior subluxation of C3 on C4. This is likely degenerative but ligamentous injury is not excluded. Diffuse degenerative change throughout the cervical spine with narrowed cervical interspaces and endplate hypertrophic changes. Degenerative changes throughout the facet joints. Bone encroachment upon neural foramina bilaterally at multiple levels. No prevertebral soft tissue swelling. No vertebral compression deformities. C1-2 articulation appears intact. IMPRESSION: No acute intracranial abnormalities. Chronic atrophy and small vessel ischemic changes. Opacification of some of the paranasal sinuses. Examination of cervical spine is significant limited due to motion artifact. There is no gross displaced fracture identified. However, there is anterior subluxation of C3 on C4. This is likely degenerative but ligamentous injury is not excluded. Diffuse degenerative changes throughout cervical spine. Electronically Signed   By: Lucienne Capers M.D.   On: 03/22/2015 21:13   Ct Cervical Spine Wo Contrast  03/22/2015  CLINICAL DATA:  Trip and  fall injury, subsequent patient fell from stretcher onto the left side. Now with head and neck pain. Examination is limited as the patient had difficulty fitting into the gantry and was short of breath while laying flat. EXAM: CT HEAD WITHOUT CONTRAST CT CERVICAL SPINE WITHOUT CONTRAST TECHNIQUE: Multidetector CT imaging of the head and cervical spine was performed following the standard protocol without intravenous contrast. Multiplanar CT image reconstructions of the cervical spine were also generated. COMPARISON:  CT head 12/24/2014 FINDINGS: CT HEAD FINDINGS Diffuse cerebral atrophy. Mild ventricular dilatation consistent with central atrophy. Low-attenuation changes in  the deep white matter consistent with small vessel ischemia. Basal ganglia calcifications. No mass effect or midline shift. No abnormal extra-axial fluid collections. Gray-white matter junctions are distinct. Basal cisterns are not effaced. No evidence of acute intracranial hemorrhage. No depressed skull fractures. Opacification of left maxillary antrum and some of the bilateral ethmoid air cells. Opacification of some of the mastoid air cells bilaterally. Calcification and deformity of the left globe. Vascular calcifications. CT CERVICAL SPINE FINDINGS Cervical spine examination is significantly limited due to motion artifact. There is mild anterior subluxation of C3 on C4. This is likely degenerative but ligamentous injury is not excluded. Diffuse degenerative change throughout the cervical spine with narrowed cervical interspaces and endplate hypertrophic changes. Degenerative changes throughout the facet joints. Bone encroachment upon neural foramina bilaterally at multiple levels. No prevertebral soft tissue swelling. No vertebral compression deformities. C1-2 articulation appears intact. IMPRESSION: No acute intracranial abnormalities. Chronic atrophy and small vessel ischemic changes. Opacification of some of the paranasal sinuses.  Examination of cervical spine is significant limited due to motion artifact. There is no gross displaced fracture identified. However, there is anterior subluxation of C3 on C4. This is likely degenerative but ligamentous injury is not excluded. Diffuse degenerative changes throughout cervical spine. Electronically Signed   By: Lucienne Capers M.D.   On: 03/22/2015 21:13   Mr Brain Wo Contrast  04/17/2015  CLINICAL DATA:  Acute encephalopathy. History of CHF, atrial fibrillation, diabetes, morbid obesity, chronic kidney disease. EXAM: MRI HEAD WITHOUT CONTRAST TECHNIQUE: Multiplanar, multiecho pulse sequences of the brain and surrounding structures were obtained without intravenous contrast. COMPARISON:  CT head March 22, 2015 FINDINGS: Multiple sequences are moderately or severely motion degraded. No reduced diffusion to suggest acute ischemia. Extremely motion degraded SWAN sequence with possible susceptibility artifact in RIGHT parietal sulci. No corroborative abnormality on the remaining sequences. Ventricles and sulci are normal for patient's age. At least patchy supratentorial white matter FLAIR T2 hyperintensities without midline shift or mass effect. Symmetric mildly prominent bifrontal extra-axial spaces favor underlying parenchymal volume loss, less likely chronic subdural hematomas without mass effect. Major intracranial vascular flow voids seen at the skull base. LEFT phthisis bulbi. LEFT maxillary sinus mucosal thickening. Bilateral mastoid effusions. Limited assessment for bone marrow signal abnormality due to motion degraded T1 sequences. IMPRESSION: Motion degraded examination. Equivocal findings for susceptibility artifact in the sulci which could be artifact or represent subarachnoid hemorrhage, less favored. As patient had difficulty remaining still, recommend follow-up CT head or, repeat MRI of the brain with sedation. Involutional changes. Suspected moderate chronic small vessel ischemic  disease. Electronically Signed   By: Elon Alas M.D.   On: 04/17/2015 03:16   Mr Cervical Spine Wo Contrast  03/23/2015  CLINICAL DATA:  Status post trip and fall 03/22/2015. Increasing diffuse weakness. Question ligamentous injury at C3-4. Initial encounter. EXAM: MRI CERVICAL SPINE WITHOUT CONTRAST TECHNIQUE: Multiplanar, multisequence MR imaging of the cervical spine was performed. No intravenous contrast was administered. COMPARISON:  CT cervical spine 03/22/2015. FINDINGS: The study is degraded by patient motion. Vertebral body height and signal are maintained. Trace anterolisthesis C3 on C4 is due to facet arthropathy. No evidence of ligamentous injury is seen. The craniocervical junction is normal and cervical cord signal is normal. Imaged paraspinous structures are unremarkable. Mucosal thickening is noted in the right maxillary sinus. C2-3: Facet degenerative disease is seen. The central canal and foramina appear open. C3-4: Facet arthropathy is seen. The disc is uncovered without bulging. The central canal is open. There  is likely moderate bilateral foraminal narrowing although evaluation is degraded by motion. C4-5: Mild posterior bony ridging and uncovertebral disease. The central canal is open. There appears to be bilateral foraminal narrowing. C5-6: Small disc osteophyte complex, facet arthropathy and uncovertebral disease are seen. The ventral thecal sac is nearly effaced. The foramina are narrowed. C6-7: Small disc osteophyte complex effaces the ventral thecal sac. Uncovertebral disease appears worse on the left there is left worse than right foraminal narrowing. C7-T1: Minimal disc bulge without central canal or foraminal stenosis. IMPRESSION: Trace anterolisthesis C3 on C4 seen on comparison CT scan is due to facet degenerative disease. No evidence of posttraumatic change is present on this examination. Mild multilevel disc bulging as described above most notable at C5-6 and C6-7 where  the ventral thecal sac is effaced. There appears to be multilevel foraminal narrowing. Evaluation is limited by patient motion. Electronically Signed   By: Inge Rise M.D.   On: 03/23/2015 13:07   US Renal  03/24/2015  CLINICAL DATA:  Acute renal insufficiency EXAM: RENAL / URINARY TRACT ULTRASOUND COMPLETE COMPARISON:  None. FINDINGS: Right Kidney: Length: 14 cm in length. There is cortical thinning probable due to atrophy. No hydronephrosis. No renal calculus. Left Kidney: Not identified due to patient's large body habitus. Bladder: There is a Foley catheter within decompressed urinary bladder. IMPRESSION: No right hydronephrosis. Cortical thinning probable due to atrophy. There left kidney is not identified. Foley catheter within decompressed urinary bladder. Electronically Signed   By: Lahoma Crocker M.D.   On: 03/24/2015 18:34   Dg Chest Port 1 View  04/17/2015  CLINICAL DATA:  Shortness of breath today. EXAM: PORTABLE CHEST 1 VIEW COMPARISON:  Single view of the chest 04/13/2015, 03/23/2015 08/16/2014. FINDINGS: There is cardiomegaly and vascular congestion without frank edema. Left basilar atelectasis is noted. No pneumothorax. Aortic atherosclerosis is seen. IMPRESSION: Cardiomegaly and pulmonary vascular congestion. Electronically Signed   By: Inge Rise M.D.   On: 04/17/2015 08:02   Dg Chest Port 1 View  04/13/2015  CLINICAL DATA:  81 year old male with acute shortness of breath. EXAM: PORTABLE CHEST 1 VIEW COMPARISON:  03/23/2015 and prior chest radiographs FINDINGS: Cardiomegaly and mild pulmonary vascular congestion noted. Bibasilar opacities likely represent atelectasis. There is no evidence of pneumothorax. No acute bony abnormalities are identified. IMPRESSION: Cardiomegaly, mild pulmonary vascular congestion and probable bibasilar atelectasis. Electronically Signed   By: Margarette Canada M.D.   On: 04/13/2015 17:41   Dg Chest Port 1 View  03/23/2015  CLINICAL DATA:  Shortness of  breath, acute renal failure. EXAM: PORTABLE CHEST 1 VIEW COMPARISON:  December 24, 2014. FINDINGS: Stable cardiomegaly is noted. No pneumothorax or significant pleural effusion is noted. Mild diffuse interstitial densities are noted throughout both lungs, right greater than left, concerning for mild pulmonary edema. Stable mild central pulmonary vascular congestion is noted. Bony thorax is unremarkable. IMPRESSION: Stable cardiomegaly and central pulmonary vascular congestion is noted, with mild diffuse interstitial densities throughout both lungs concerning for pulmonary edema. Electronically Signed   By: Marijo Conception, M.D.   On: 03/23/2015 07:46   Dg Knee Complete 4 Views Right  03/22/2015  CLINICAL DATA:  Initial evaluation for acute trauma, fall. EXAM: RIGHT KNEE - COMPLETE 4+ VIEW COMPARISON:  None. FINDINGS: Diffuse osteopenia present, somewhat limiting evaluation for subtle nondisplaced fractures. No acute fracture or dislocation identified. No joint effusion. Severe tricompartmental degenerative osteoarthrosis. Prepatellar soft tissue swelling. IMPRESSION: 1. No acute fracture or dislocation. 2. Prepatellar soft tissue swelling. 3.  Severe tricompartmental degenerative osteoarthrosis. 4. Diffuse osteopenia. Electronically Signed   By: Jeannine Boga M.D.   On: 03/22/2015 21:15   Dg Hand Complete Left  03/22/2015  CLINICAL DATA:  Status post fall at home while walking. Left hand swelling. Initial encounter. EXAM: LEFT HAND - COMPLETE 3+ VIEW COMPARISON:  None. FINDINGS: There is no evidence of fracture or dislocation. Joint space narrowing is noted at the first carpometacarpal joint, and there is mild diffuse osteopenia of visualized osseous structures. The carpal rows are intact, and demonstrate normal alignment. Diffuse soft tissue swelling is noted about the wrist. IMPRESSION: No evidence of fracture or dislocation. Electronically Signed   By: Garald Balding M.D.   On: 03/22/2015 21:17   Dg  Hand Complete Right  03/22/2015  CLINICAL DATA:  Patient fell at home while walking. Swelling in both hands. EXAM: RIGHT HAND - COMPLETE 3+ VIEW COMPARISON:  None. FINDINGS: Degenerative changes throughout the joints of the right hand and wrist. No evidence of acute fracture or subluxation. No focal bone lesion or bone destruction. Bone cortex and trabecular architecture appear intact. No radiopaque soft tissue foreign bodies. IMPRESSION: Prominent diffuse degenerative changes throughout the right hand and wrist. No acute fractures identified. Electronically Signed   By: Lucienne Capers M.D.   On: 03/22/2015 21:16    CBC  Recent Labs Lab 04/13/15 1636 04/15/15 0340 04/17/15 0415 04/18/15 0735  WBC 5.4 4.1 4.9 5.0  HGB 8.1* 8.1* 8.6* 8.3*  HCT 27.8* 27.0* 29.1* 27.6*  PLT 101* 84* 67* 70*  MCV 94.9 94.7 95.7 96.2  MCH 27.6 28.4 28.3 28.9  MCHC 29.1* 30.0 29.6* 30.1  RDW 24.1* 23.9* 23.6* 23.5*    Chemistries   Recent Labs Lab 04/15/15 0340 04/16/15 0501 04/17/15 0415 04/18/15 0735 04/19/15 0338  NA 140 139 140 139 140  K 3.7 3.6 3.3* 3.2* 3.3*  CL 103 100* 101 99* 98*  CO2 26 28 30 31  32  GLUCOSE 107* 124* 120* 120* 113*  BUN 57* 59* 60* 60* 56*  CREATININE 2.58* 2.55* 2.43* 2.33* 2.15*  CALCIUM 9.0 9.0 9.4 9.2 9.4  AST  --  26 25 21   --   ALT  --  17 17 14*  --   ALKPHOS  --  64 67 59  --   BILITOT  --  2.3* 3.1* 1.9*  --    ------------------------------------------------------------------------------------------------------------------ estimated creatinine clearance is 43.2 mL/min (by C-G formula based on Cr of 2.15). ------------------------------------------------------------------------------------------------------------------ No results for input(s): HGBA1C in the last 72 hours. ------------------------------------------------------------------------------------------------------------------ No results for input(s): CHOL, HDL, LDLCALC, TRIG, CHOLHDL,  LDLDIRECT in the last 72 hours. ------------------------------------------------------------------------------------------------------------------  Recent Labs  04/16/15 1610  TSH 1.034   ------------------------------------------------------------------------------------------------------------------ No results for input(s): VITAMINB12, FOLATE, FERRITIN, TIBC, IRON, RETICCTPCT in the last 72 hours.  Coagulation profile No results for input(s): INR, PROTIME in the last 168 hours.  No results for input(s): DDIMER in the last 72 hours.  Cardiac Enzymes No results for input(s): CKMB, TROPONINI, MYOGLOBIN in the last 168 hours.  Invalid input(s): CK ------------------------------------------------------------------------------------------------------------------ Invalid input(s): POCBNP   Recent Labs  04/18/15 0732 04/18/15 1145 04/18/15 1616 04/18/15 2213 04/19/15 0745 04/19/15 [place] M.D. [facility] 04/19/2015, 12:48 PM  Pager: [phone] Between 7am to 7pm - call Pager - [phone]  After 7pm go to [email or web address] - password [email or web address]  Call night coverage person covering after 7pm

## 2015-04-19 NOTE — Care Management Note (Signed)
Case Management Note Initial Note started via [name] Patient Details  Name: [name] MRN: [id number] Date of Birth: 09-30-1931  Subjective/Objective:  CHF  Action/Plan: CM spoke with patient at the bedside. Patient lives with his wife at home (nurse reports his wife is 79 years old). Reports his daughter works as a Occupational psychologist at [employer]. She arranges his medications in pill boxes and he reports taking his medications daily with the exception of missing an evening dose last week. He does not weigh himself (unable to do so). His medications are delivered to his home. Reports he overdosed on "one little pill" recently which resulted in an admission to [facility]. Reports having a colonoscopy while inpatient there. PCP is Dr. Wende Neighbors in [place]. CM will continue to follow. Awaiting PT evaluation.   Expected Discharge Date:                  Expected Discharge Plan:  Skilled Nursing Facility  In-House Referral:  Clinical Social Work  Discharge planning Services  CM Consult  Post Acute Care Choice:  NA Choice offered to:  NA  DME Arranged:  N/A DME Agency:  NA  HH Arranged:  NA HH Agency:  NA  Status of Service:  Completed, signed off  Medicare Important Message Given:  Yes Date Medicare IM Given:    Medicare IM give by:    Date Additional Medicare IM Given:    Additional Medicare Important Message give by:     If discussed at [place] of Stay Meetings, dates discussed:    Additional Comments: 1133 04-19-15 [name], RN BSN [phone] PT recommendations for SNF once stable. Palliative Care following pt as well. Pt with encephalopathy and lactulose administered. CSW to assist with disposition needs.   [name], RN 04/19/2015, 11:27 AM

## 2015-04-20 LAB — CBC
HEMATOCRIT: 28.8 % — AB (ref 39.0–52.0)
Hemoglobin: 8.4 g/dL — ABNORMAL LOW (ref 13.0–17.0)
MCH: 28.5 pg (ref 26.0–34.0)
MCHC: 29.2 g/dL — AB (ref 30.0–36.0)
MCV: 97.6 fL (ref 78.0–100.0)
Platelets: 83 10*3/uL — ABNORMAL LOW (ref 150–400)
RBC: 2.95 MIL/uL — ABNORMAL LOW (ref 4.22–5.81)
RDW: 23.8 % — AB (ref 11.5–15.5)
WBC: 5.6 10*3/uL (ref 4.0–10.5)

## 2015-04-20 LAB — GLUCOSE, CAPILLARY
GLUCOSE-CAPILLARY: 181 mg/dL — AB (ref 65–99)
GLUCOSE-CAPILLARY: 136 mg/dL — AB (ref 65–99)
Glucose-Capillary: 142 mg/dL — ABNORMAL HIGH (ref 65–99)
Glucose-Capillary: 157 mg/dL — ABNORMAL HIGH (ref 65–99)

## 2015-04-20 LAB — URINALYSIS, ROUTINE W REFLEX MICROSCOPIC
Bilirubin Urine: NEGATIVE
Glucose, UA: NEGATIVE mg/dL
Ketones, ur: NEGATIVE mg/dL
NITRITE: NEGATIVE
PROTEIN: NEGATIVE mg/dL
Specific Gravity, Urine: 1.015 (ref 1.005–1.030)
pH: 5 (ref 5.0–8.0)

## 2015-04-20 LAB — URINE MICROSCOPIC-ADD ON

## 2015-04-20 LAB — BASIC METABOLIC PANEL
Anion gap: 8 (ref 5–15)
BUN: 55 mg/dL — AB (ref 6–20)
CHLORIDE: 97 mmol/L — AB (ref 101–111)
CO2: 34 mmol/L — AB (ref 22–32)
CREATININE: 1.96 mg/dL — AB (ref 0.61–1.24)
Calcium: 9.3 mg/dL (ref 8.9–10.3)
GFR calc Af Amer: 35 mL/min — ABNORMAL LOW (ref >60)
GFR calc non Af Amer: 30 mL/min — ABNORMAL LOW (ref >60)
Glucose, Bld: 153 mg/dL — ABNORMAL HIGH (ref 65–99)
POTASSIUM: 3.4 mmol/L — AB (ref 3.5–5.1)
SODIUM: 139 mmol/L (ref 135–145)

## 2015-04-20 LAB — AMMONIA: AMMONIA: 64 umol/L — AB (ref 9–35)

## 2015-04-20 MED ORDER — LACTULOSE 10 GM/15ML PO SOLN
20.0000 g | Freq: Two times a day (BID) | ORAL | Status: DC
  Administered 2015-04-20 – 2015-04-23 (×7): 20 g via ORAL
  Filled 2015-04-20 (×8): qty 30

## 2015-04-20 MED ORDER — MUSCLE RUB 10-15 % EX CREA
TOPICAL_CREAM | CUTANEOUS | Status: DC | PRN
  Administered 2015-04-21 – 2015-04-23 (×3): via TOPICAL
  Filled 2015-04-20: qty 85

## 2015-04-20 MED ORDER — POTASSIUM CHLORIDE CRYS ER 20 MEQ PO TBCR
20.0000 meq | EXTENDED_RELEASE_TABLET | Freq: Every day | ORAL | Status: DC
  Administered 2015-04-20 – 2015-04-24 (×5): 20 meq via ORAL
  Filled 2015-04-20 (×5): qty 1

## 2015-04-20 NOTE — Care Management Important Message (Signed)
Important Message  Patient Details  Name: [name] MRN: [id number] Date of Birth: 1932/01/02   Medicare Important Message Given:  Yes    [name] May 04/20/2015, 3:14 PM

## 2015-04-20 NOTE — Progress Notes (Signed)
Patient Name: [name] Date of Encounter: 04/20/2015     Principal Problem:   Acute on chronic diastolic CHF (congestive heart failure) ([place]) Active Problems:   Morbid obesity-BMI 35   Essential hypertension, benign   CAD- mild non obstructive Oct 2006   CKD (chronic kidney disease) stage 3, GFR 30-59 ml/min   Chronic anemia   Thrombocytopenia (HCC)   IDDM (insulin dependent diabetes mellitus) ([place])   Atrial fibrillation (HCC)   OSA treated with BiPAP   Encounter for palliative care   Altered mental state   Palliative care encounter    SUBJECTIVE  The patient is more alert.  He is looking forward to getting back to [facility] and being followed by Dr. Domenic Polite.  He denies any chest pain or shortness of breath.  He continues to diurese.  His renal function is improving.  He is maintaining normal sinus rhythm with frequent PVCs  CURRENT MEDS . antiseptic oral rinse  7 mL Mouth Rinse q12n4p  . calcium-vitamin D  1 tablet Oral BID  . chlorhexidine  15 mL Mouth Rinse BID  . furosemide  80 mg Intravenous BID  . insulin aspart  0-5 Units Subcutaneous QHS  . insulin aspart  0-9 Units Subcutaneous TID WC  . labetalol  200 mg Oral BID  . lactulose  20 g Oral TID  . latanoprost  1 drop Right Eye QHS  . loratadine  10 mg Oral Daily  . multivitamin with minerals  1 tablet Oral Daily  . sertraline  25 mg Oral QHS  . sodium chloride  3 mL Intravenous Q12H  . spironolactone  25 mg Oral Daily  . tamsulosin  0.4 mg Oral QHS  . vitamin B-12  1,000 mcg Oral Daily    OBJECTIVE  Filed Vitals:   04/20/15 0023 04/20/15 0045 04/20/15 0408 04/20/15 0732  BP: 115/84  114/53 124/45  Pulse: 60  64 71  Temp: 97.8 F (36.6 C)  98 F (36.7 C) 98.7 F (37.1 C)  TempSrc: Oral  Oral Oral  Resp: 18  21 17   Height:      Weight:   355 lb 8 oz (161.254 kg)   SpO2: 100% 98% 97% 96%    Intake/Output Summary (Last 24 hours) at 04/20/15 0934 Last data filed at 04/20/15 0747  Gross per 24  hour  Intake   1083 ml  Output   2625 ml  Net  -1542 ml   Filed Weights   04/18/15 0325 04/19/15 0500 04/20/15 0408  Weight: 364 lb 8 oz (165.336 kg) 359 lb 14.4 oz (163.25 kg) 355 lb 8 oz (161.254 kg)    PHYSICAL EXAM  General: Pleasant, NAD. Neuro: Alert and oriented X 3. Moves all extremities spontaneously. Psych: Normal affect. HEENT:  Normal.  The patient is blind in his left eye  Neck: Supple without bruits or JVD. Lungs:  Resp regular and unlabored, CTA. Heart: RRR no s3, s4, could 2/6 systolic ejection murmur at base Abdomen: Soft, non-tender, non-distended, BS + x 4.  Extremities: Improving bilateral edema.  Lymphedema worse on left.  Accessory Clinical Findings  CBC  Recent Labs  04/18/15 0735 04/20/15 0519  WBC 5.0 5.6  HGB 8.3* 8.4*  HCT 27.6* 28.8*  MCV 96.2 97.6  PLT 70* 83*   Basic Metabolic Panel  Recent Labs  04/19/15 0338 04/20/15 0519  NA 140 139  K 3.3* 3.4*  CL 98* 97*  CO2 32 34*  GLUCOSE 113* 153*  BUN 56*  55*  CREATININE 2.15* 1.96*  CALCIUM 9.4 9.3   Liver Function Tests  Recent Labs  04/18/15 0735  AST 21  ALT 14*  ALKPHOS 59  BILITOT 1.9*  PROT 5.1*  ALBUMIN 2.7*   No results for input(s): LIPASE, AMYLASE in the last 72 hours. Cardiac Enzymes No results for input(s): CKTOTAL, CKMB, CKMBINDEX, TROPONINI in the last 72 hours. BNP Invalid input(s): POCBNP D-Dimer No results for input(s): DDIMER in the last 72 hours. Hemoglobin A1C No results for input(s): HGBA1C in the last 72 hours. Fasting Lipid Panel No results for input(s): CHOL, HDL, LDLCALC, TRIG, CHOLHDL, LDLDIRECT in the last 72 hours. Thyroid Function Tests No results for input(s): TSH, T4TOTAL, T3FREE, THYROIDAB in the last 72 hours.  Invalid input(s): FREET3  TELE  Normal sinus rhythm with frequent PVCs  ECG    Radiology/Studies  Dg Pelvis 1-2 Views  03/23/2015  CLINICAL DATA:  Fall.  Right hip pain. EXAM: PELVIS - 1-2 VIEW COMPARISON:  None.  FINDINGS: There is no evidence of pelvic fracture or diastasis. No pelvic bone lesions are seen. Minimal hypertrophic changes present at the superior acetabular margins in both hip joints. IMPRESSION: No pelvic fracture. Minimal osteoarthritis in the weight-bearing portions of both hip joints. Electronically Signed   By: Ilona Sorrel M.D.   On: 03/23/2015 08:17   Ct Head Wo Contrast  04/17/2015  CLINICAL DATA:  Altered mental status.  Encephalopathy. EXAM: CT HEAD WITHOUT CONTRAST TECHNIQUE: Contiguous axial images were obtained from the base of the skull through the vertex without intravenous contrast. COMPARISON:  Head CT March 22, 2015 and brain MRI April 17, 2015 FINDINGS: There is stable mild generalized atrophy with slightly greater atrophy in the left frontal and left temporal lobe regions than elsewhere. There is no intracranial mass, hemorrhage, extra-axial fluid collection, or midline shift. Mild small vessel disease in the centra semiovale bilaterally is stable. There is no new gray-white compartment lesion. No acute infarct evident on this study. The bony calvarium appears intact. There is chronic appearing mastoid disease bilaterally. There is a chronic defect in the left lamina papyracea with evidence of old maxillary antral region fractures on the left. There is mucosal thickening in the left maxillary antrum. 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 Right orbit appears normal. IMPRESSION: Atrophy with mild small vessel disease, stable. No hemorrhage, mass, or acute appearing infarct. Evidence of old left facial and left globe trauma. Chronic mastoid disease bilaterally. Electronically Signed   By: Lowella Grip III M.D.   On: 04/17/2015 14:59   Ct Head Wo Contrast  03/22/2015  CLINICAL DATA:  Trip and fall injury, subsequent patient fell from stretcher onto the left side. Now with head and neck pain. Examination is limited as the patient had  difficulty fitting into the gantry and was short of breath while laying flat. EXAM: CT HEAD WITHOUT CONTRAST CT CERVICAL SPINE WITHOUT CONTRAST TECHNIQUE: Multidetector CT imaging of the head and cervical spine was performed following the standard protocol without intravenous contrast. Multiplanar CT image reconstructions of the cervical spine were also generated. COMPARISON:  CT head 12/24/2014 FINDINGS: CT HEAD FINDINGS Diffuse cerebral atrophy. Mild ventricular dilatation consistent with central atrophy. Low-attenuation changes in the deep white matter consistent with small vessel ischemia. Basal ganglia calcifications. No mass effect or midline shift. No abnormal extra-axial fluid collections. Gray-white matter junctions are distinct. Basal cisterns are not effaced. No evidence of acute intracranial hemorrhage. No depressed skull fractures. Opacification of left  maxillary antrum and some of the bilateral ethmoid air cells. Opacification of some of the mastoid air cells bilaterally. Calcification and deformity of the left globe. Vascular calcifications. CT CERVICAL SPINE FINDINGS Cervical spine examination is significantly limited due to motion artifact. There is mild anterior subluxation of C3 on C4. This is likely degenerative but ligamentous injury is not excluded. Diffuse degenerative change throughout the cervical spine with narrowed cervical interspaces and endplate hypertrophic changes. Degenerative changes throughout the facet joints. Bone encroachment upon neural foramina bilaterally at multiple levels. No prevertebral soft tissue swelling. No vertebral compression deformities. C1-2 articulation appears intact. IMPRESSION: No acute intracranial abnormalities. Chronic atrophy and small vessel ischemic changes. Opacification of some of the paranasal sinuses. Examination of cervical spine is significant limited due to motion artifact. There is no gross displaced fracture identified. However, there is  anterior subluxation of C3 on C4. This is likely degenerative but ligamentous injury is not excluded. Diffuse degenerative changes throughout cervical spine. Electronically Signed   By: Lucienne Capers M.D.   On: 03/22/2015 21:13   Ct Cervical Spine Wo Contrast  03/22/2015  CLINICAL DATA:  Trip and fall injury, subsequent patient fell from stretcher onto the left side. Now with head and neck pain. Examination is limited as the patient had difficulty fitting into the gantry and was short of breath while laying flat. EXAM: CT HEAD WITHOUT CONTRAST CT CERVICAL SPINE WITHOUT CONTRAST TECHNIQUE: Multidetector CT imaging of the head and cervical spine was performed following the standard protocol without intravenous contrast. Multiplanar CT image reconstructions of the cervical spine were also generated. COMPARISON:  CT head 12/24/2014 FINDINGS: CT HEAD FINDINGS Diffuse cerebral atrophy. Mild ventricular dilatation consistent with central atrophy. Low-attenuation changes in the deep white matter consistent with small vessel ischemia. Basal ganglia calcifications. No mass effect or midline shift. No abnormal extra-axial fluid collections. Gray-white matter junctions are distinct. Basal cisterns are not effaced. No evidence of acute intracranial hemorrhage. No depressed skull fractures. Opacification of left maxillary antrum and some of the bilateral ethmoid air cells. Opacification of some of the mastoid air cells bilaterally. Calcification and deformity of the left globe. Vascular calcifications. CT CERVICAL SPINE FINDINGS Cervical spine examination is significantly limited due to motion artifact. There is mild anterior subluxation of C3 on C4. This is likely degenerative but ligamentous injury is not excluded. Diffuse degenerative change throughout the cervical spine with narrowed cervical interspaces and endplate hypertrophic changes. Degenerative changes throughout the facet joints. Bone encroachment upon neural  foramina bilaterally at multiple levels. No prevertebral soft tissue swelling. No vertebral compression deformities. C1-2 articulation appears intact. IMPRESSION: No acute intracranial abnormalities. Chronic atrophy and small vessel ischemic changes. Opacification of some of the paranasal sinuses. Examination of cervical spine is significant limited due to motion artifact. There is no gross displaced fracture identified. However, there is anterior subluxation of C3 on C4. This is likely degenerative but ligamentous injury is not excluded. Diffuse degenerative changes throughout cervical spine. Electronically Signed   By: Lucienne Capers M.D.   On: 03/22/2015 21:13   Mr Brain Wo Contrast  04/17/2015  CLINICAL DATA:  Acute encephalopathy. History of CHF, atrial fibrillation, diabetes, morbid obesity, chronic kidney disease. EXAM: MRI HEAD WITHOUT CONTRAST TECHNIQUE: Multiplanar, multiecho pulse sequences of the brain and surrounding structures were obtained without intravenous contrast. COMPARISON:  CT head March 22, 2015 FINDINGS: Multiple sequences are moderately or severely motion degraded. No reduced diffusion to suggest acute ischemia. Extremely motion degraded SWAN sequence with possible susceptibility artifact  in RIGHT parietal sulci. No corroborative abnormality on the remaining sequences. Ventricles and sulci are normal for patient's age. At least patchy supratentorial white matter FLAIR T2 hyperintensities without midline shift or mass effect. Symmetric mildly prominent bifrontal extra-axial spaces favor underlying parenchymal volume loss, less likely chronic subdural hematomas without mass effect. Major intracranial vascular flow voids seen at the skull base. LEFT phthisis bulbi. LEFT maxillary sinus mucosal thickening. Bilateral mastoid effusions. Limited assessment for bone marrow signal abnormality due to motion degraded T1 sequences. IMPRESSION: Motion degraded examination. Equivocal findings  for susceptibility artifact in the sulci which could be artifact or represent subarachnoid hemorrhage, less favored. As patient had difficulty remaining still, recommend follow-up CT head or, repeat MRI of the brain with sedation. Involutional changes. Suspected moderate chronic small vessel ischemic disease. Electronically Signed   By: Elon Alas M.D.   On: 04/17/2015 03:16   Mr Cervical Spine Wo Contrast  03/23/2015  CLINICAL DATA:  Status post trip and fall 03/22/2015. Increasing diffuse weakness. Question ligamentous injury at C3-4. Initial encounter. EXAM: MRI CERVICAL SPINE WITHOUT CONTRAST TECHNIQUE: Multiplanar, multisequence MR imaging of the cervical spine was performed. No intravenous contrast was administered. COMPARISON:  CT cervical spine 03/22/2015. FINDINGS: The study is degraded by patient motion. Vertebral body height and signal are maintained. Trace anterolisthesis C3 on C4 is due to facet arthropathy. No evidence of ligamentous injury is seen. The craniocervical junction is normal and cervical cord signal is normal. Imaged paraspinous structures are unremarkable. Mucosal thickening is noted in the right maxillary sinus. C2-3: Facet degenerative disease is seen. The central canal and foramina appear open. C3-4: Facet arthropathy is seen. The disc is uncovered without bulging. The central canal is open. There is likely moderate bilateral foraminal narrowing although evaluation is degraded by motion. C4-5: Mild posterior bony ridging and uncovertebral disease. The central canal is open. There appears to be bilateral foraminal narrowing. C5-6: Small disc osteophyte complex, facet arthropathy and uncovertebral disease are seen. The ventral thecal sac is nearly effaced. The foramina are narrowed. C6-7: Small disc osteophyte complex effaces the ventral thecal sac. Uncovertebral disease appears worse on the left there is left worse than right foraminal narrowing. C7-T1: Minimal disc bulge  without central canal or foraminal stenosis. IMPRESSION: Trace anterolisthesis C3 on C4 seen on comparison CT scan is due to facet degenerative disease. No evidence of posttraumatic change is present on this examination. Mild multilevel disc bulging as described above most notable at C5-6 and C6-7 where the ventral thecal sac is effaced. There appears to be multilevel foraminal narrowing. Evaluation is limited by patient motion. Electronically Signed   By: Inge Rise M.D.   On: 03/23/2015 13:07   US Renal  03/24/2015  CLINICAL DATA:  Acute renal insufficiency EXAM: RENAL / URINARY TRACT ULTRASOUND COMPLETE COMPARISON:  None. FINDINGS: Right Kidney: Length: 14 cm in length. There is cortical thinning probable due to atrophy. No hydronephrosis. No renal calculus. Left Kidney: Not identified due to patient's large body habitus. Bladder: There is a Foley catheter within decompressed urinary bladder. IMPRESSION: No right hydronephrosis. Cortical thinning probable due to atrophy. There left kidney is not identified. Foley catheter within decompressed urinary bladder. Electronically Signed   By: Lahoma Crocker M.D.   On: 03/24/2015 18:34   Dg Chest Port 1 View  04/17/2015  CLINICAL DATA:  Shortness of breath today. EXAM: PORTABLE CHEST 1 VIEW COMPARISON:  Single view of the chest 04/13/2015, 03/23/2015 08/16/2014. FINDINGS: There is cardiomegaly and vascular congestion without  frank edema. Left basilar atelectasis is noted. No pneumothorax. Aortic atherosclerosis is seen. IMPRESSION: Cardiomegaly and pulmonary vascular congestion. Electronically Signed   By: Inge Rise M.D.   On: 04/17/2015 08:02   Dg Chest Port 1 View  04/13/2015  CLINICAL DATA:  81 year old male with acute shortness of breath. EXAM: PORTABLE CHEST 1 VIEW COMPARISON:  03/23/2015 and prior chest radiographs FINDINGS: Cardiomegaly and mild pulmonary vascular congestion noted. Bibasilar opacities likely represent atelectasis. There is no  evidence of pneumothorax. No acute bony abnormalities are identified. IMPRESSION: Cardiomegaly, mild pulmonary vascular congestion and probable bibasilar atelectasis. Electronically Signed   By: Margarette Canada M.D.   On: 04/13/2015 17:41   Dg Chest Port 1 View  03/23/2015  CLINICAL DATA:  Shortness of breath, acute renal failure. EXAM: PORTABLE CHEST 1 VIEW COMPARISON:  December 24, 2014. FINDINGS: Stable cardiomegaly is noted. No pneumothorax or significant pleural effusion is noted. Mild diffuse interstitial densities are noted throughout both lungs, right greater than left, concerning for mild pulmonary edema. Stable mild central pulmonary vascular congestion is noted. Bony thorax is unremarkable. IMPRESSION: Stable cardiomegaly and central pulmonary vascular congestion is noted, with mild diffuse interstitial densities throughout both lungs concerning for pulmonary edema. Electronically Signed   By: Marijo Conception, M.D.   On: 03/23/2015 07:46   Dg Knee Complete 4 Views Right  03/22/2015  CLINICAL DATA:  Initial evaluation for acute trauma, fall. EXAM: RIGHT KNEE - COMPLETE 4+ VIEW COMPARISON:  None. FINDINGS: Diffuse osteopenia present, somewhat limiting evaluation for subtle nondisplaced fractures. No acute fracture or dislocation identified. No joint effusion. Severe tricompartmental degenerative osteoarthrosis. Prepatellar soft tissue swelling. IMPRESSION: 1. No acute fracture or dislocation. 2. Prepatellar soft tissue swelling. 3. Severe tricompartmental degenerative osteoarthrosis. 4. Diffuse osteopenia. Electronically Signed   By: Jeannine Boga M.D.   On: 03/22/2015 21:15   Dg Hand Complete Left  03/22/2015  CLINICAL DATA:  Status post fall at home while walking. Left hand swelling. Initial encounter. EXAM: LEFT HAND - COMPLETE 3+ VIEW COMPARISON:  None. FINDINGS: There is no evidence of fracture or dislocation. Joint space narrowing is noted at the first carpometacarpal joint, and there is  mild diffuse osteopenia of visualized osseous structures. The carpal rows are intact, and demonstrate normal alignment. Diffuse soft tissue swelling is noted about the wrist. IMPRESSION: No evidence of fracture or dislocation. Electronically Signed   By: Garald Balding M.D.   On: 03/22/2015 21:17   Dg Hand Complete Right  03/22/2015  CLINICAL DATA:  Patient fell at home while walking. Swelling in both hands. EXAM: RIGHT HAND - COMPLETE 3+ VIEW COMPARISON:  None. FINDINGS: Degenerative changes throughout the joints of the right hand and wrist. No evidence of acute fracture or subluxation. No focal bone lesion or bone destruction. Bone cortex and trabecular architecture appear intact. No radiopaque soft tissue foreign bodies. IMPRESSION: Prominent diffuse degenerative changes throughout the right hand and wrist. No acute fractures identified. Electronically Signed   By: Lucienne Capers M.D.   On: 03/22/2015 21:16    ASSESSMENT AND PLAN 1. Evidence of marked fluid overload and lymphedema with history of diastolic heart failure. This is complicated by renal insufficiency, anemia, and atrial fibrillation. Diuresing well. Renal function stable.  2. Acute on chronic diastolic heart failure, last LVEF 45-50% in April. Now 60%, with grade 2 diatsolic dysfunction and elevated filling pressures. Severe pulmonary hypertensin. Continue aggressive diuresis.   3. Acute on chronic renal failure, last creatinine was 2.8 in November. He follows  with Dr. Justin Mend.  4. Atrial fibrillation documented by ECG in November, no prior history of same. He is not aware of any palpitations and I am not certain about the duration. Very complex patient. At this point he would not appear to be a good candidate for long-term anticoagulation because of his dementia and multiple comorbidities. The patient was now maintaining normal sinus rhythm  5. History of nonobstructive CAD based on prior assessment, no obvious angina.  6.  Obstructive sleep apnea, on CPAP but not clear about consistency.  Plan: Continue diuresis.He can probably be transitioned to oral furosemide this weekend   Signed, Warren Danes MD

## 2015-04-20 NOTE — Progress Notes (Signed)
[facility]                                                                              Patient Demographics  [name], is a 81 y.o. male, DOB - 17-Jan-1932, [id number]  Admit date - 04/13/2015   Admitting Physician Theressa Millard, MD  Outpatient Primary MD for the patient is Wende Neighbors, MD  LOS - 7   Chief Complaint  Patient presents with  . Leg Swelling  . Congestive Heart Failure       Brief HPI   81 y.o. male with a history of Diastolic CHF, Stage III CKD, Atrial Fibrillation, IDDM, HTN. OSA, Lymphedema, chronci leg edema who presents to the ED with complaints of worsening SOB, and edema of both legs for quite some time. -He was seen by his Cardiologist Dr. Domenic Polite today and advised to go to the ED for hospitalization due to acute on chronic CHF.   Assessment & Plan   Primary problem Acute on chronic diastolic CHF.  - 2-D echo shows EF of 60%, with grade 2 diastolic and elevated filling pressures. Severe pulmonary hypertensin.  -  Continue aggressive diuresis per cardiology recommendations, hopefully transition to oral Lasix this weekend - Continue beta blocker, patient is not a good candidate for ACE/ARB due to renal issues. - Negative balance of 10 L, weight 389-> 355 lbs   Active problems Atrial fibrillation.  - Not a good candidate for Anticoagulant Rx. Per cardiology, complex patient, would not appear to be a good candidate for long-term anticoagulation because of his dementia and multiple comorbidities   IDDM.  - Hold Levemir due to borderline glucose. Hemoglobin A1c 5.9 .  - Continue sliding scale insulin    OSA treated with BiPAP. -  BIPAP qhs and PRN   Morbid obesity-BMI 51.  - Patient counseled on diet and weight control   CKD stage IV. Lymphedema, chronic leg edema. Complicated by renal insufficiency, atrial fibrillation, anemia, -  continue Lasix 80 mg IV BID, currently tolerating, baseline renal function  2.5-2.6   Acute encephalopathy: Mental status worsening since 12/20 with hyperammonemia - Mental status much improved after starting lactulose - CT head with no mass or acute infarct or bleed.  - Palliative medicine also following   Code Status: DO NOT RESUSCITATE  Family Communication: Discussed in detail with the patient, all imaging results, lab results explained to the patient    Disposition Plan:   Time Spent in minutes  15 minutes  Procedures  2-D echo  Consults   Cardiology Palliative medicine  DVT Prophylaxis   SCD's   Medications  Scheduled Meds: . antiseptic oral rinse  7 mL Mouth Rinse q12n4p  . calcium-vitamin D  1 tablet Oral BID  . chlorhexidine  15 mL Mouth Rinse BID  . furosemide  80 mg Intravenous BID  . insulin aspart  0-5 Units Subcutaneous QHS  . insulin aspart  0-9 Units Subcutaneous TID WC  . labetalol  200 mg Oral BID  . lactulose  20 g Oral TID  . latanoprost  1 drop Right Eye QHS  . loratadine  10 mg Oral Daily  .  multivitamin with minerals  1 tablet Oral Daily  . sertraline  25 mg Oral QHS  . sodium chloride  3 mL Intravenous Q12H  . spironolactone  25 mg Oral Daily  . tamsulosin  0.4 mg Oral QHS  . vitamin B-12  1,000 mcg Oral Daily   Continuous Infusions:  PRN Meds:.sodium chloride, acetaminophen, ondansetron (ZOFRAN) IV, sodium chloride   Antibiotics   Anti-infectives    None        Subjective:   [name] was seen and examined today. Alert and awake and more oriented now. Patient denies dizziness, chest pain, abdominal pain, N/V/D/C, new weakness, numbess, tingling. No acute events overnight. No shortness of breath.    Objective:   Blood pressure 124/45, pulse 71, temperature 99.7 F (37.6 C), temperature source Oral, resp. rate 17, height 6\' 1"  (1.854 m), weight 161.254 kg (355 lb 8 oz), SpO2 96 %.  Wt Readings from Last 3 Encounters:  04/20/15 161.254 kg (355 lb 8 oz)  04/13/15 166.47 kg (367 lb)  03/25/15  179.1 kg (394 lb 13.5 oz)     Intake/Output Summary (Last 24 hours) at 04/20/15 1314 Last data filed at 04/20/15 0900  Gross per 24 hour  Intake   1200 ml  Output   1925 ml  Net   -725 ml    Exam  General: Alert and oriented x 3, NAD, has dementia, pleasant and cooperative, mental status has significantly improved  HEENT:  PERRLA, EOMI  Neck: Supple, no JVD, no masses  CVS: S1 S2 clear, RRR, 2/6SEM  Respiratory: Clear to auscultation bilaterally  Abdomen: Morbidly obese, Soft, nontender, nondistended, + bowel sounds  Ext: no cyanosis clubbing, marked peripheral edema with lymphedema  Neuro: no new deficits  Skin: Lymphedema with chronic venous stasis changes  Psych: Normal affect and demeanor, alert and oriented x3   Data Review   Micro Results Recent Results (from the past 240 hour(s))  MRSA PCR Screening     Status: None   Collection Time: 04/13/15 10:31 PM  Result Value Ref Range Status   MRSA by PCR NEGATIVE NEGATIVE Final    Comment:        The GeneXpert MRSA Assay (FDA approved for NASAL specimens only), is one component of a comprehensive MRSA colonization surveillance program. It is not intended to diagnose MRSA infection nor to guide or monitor treatment for MRSA infections.     Radiology Reports Dg Pelvis 1-2 Views  03/23/2015  CLINICAL DATA:  Fall.  Right hip pain. EXAM: PELVIS - 1-2 VIEW COMPARISON:  None. FINDINGS: There is no evidence of pelvic fracture or diastasis. No pelvic bone lesions are seen. Minimal hypertrophic changes present at the superior acetabular margins in both hip joints. IMPRESSION: No pelvic fracture. Minimal osteoarthritis in the weight-bearing portions of both hip joints. Electronically Signed   By: Ilona Sorrel M.D.   On: 03/23/2015 08:17   Ct Head Wo Contrast  04/17/2015  CLINICAL DATA:  Altered mental status.  Encephalopathy. EXAM: CT HEAD WITHOUT CONTRAST TECHNIQUE: Contiguous axial images were obtained from the  base of the skull through the vertex without intravenous contrast. COMPARISON:  Head CT March 22, 2015 and brain MRI April 17, 2015 FINDINGS: There is stable mild generalized atrophy with slightly greater atrophy in the left frontal and left temporal lobe regions than elsewhere. There is no intracranial mass, hemorrhage, extra-axial fluid collection, or midline shift. Mild small vessel disease in the centra semiovale bilaterally is stable. There is no new gray-white  compartment lesion. No acute infarct evident on this study. The bony calvarium appears intact. There is chronic appearing mastoid disease bilaterally. There is a chronic defect in the left lamina papyracea with evidence of old maxillary antral region fractures on the left. There is mucosal thickening in the left maxillary antrum. 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 Right orbit appears normal. IMPRESSION: Atrophy with mild small vessel disease, stable. No hemorrhage, mass, or acute appearing infarct. Evidence of old left facial and left globe trauma. Chronic mastoid disease bilaterally. Electronically Signed   By: Lowella Grip III M.D.   On: 04/17/2015 14:59   Ct Head Wo Contrast  03/22/2015  CLINICAL DATA:  Trip and fall injury, subsequent patient fell from stretcher onto the left side. Now with head and neck pain. Examination is limited as the patient had difficulty fitting into the gantry and was short of breath while laying flat. EXAM: CT HEAD WITHOUT CONTRAST CT CERVICAL SPINE WITHOUT CONTRAST TECHNIQUE: Multidetector CT imaging of the head and cervical spine was performed following the standard protocol without intravenous contrast. Multiplanar CT image reconstructions of the cervical spine were also generated. COMPARISON:  CT head 12/24/2014 FINDINGS: CT HEAD FINDINGS Diffuse cerebral atrophy. Mild ventricular dilatation consistent with central atrophy. Low-attenuation changes in the deep  white matter consistent with small vessel ischemia. Basal ganglia calcifications. No mass effect or midline shift. No abnormal extra-axial fluid collections. Gray-white matter junctions are distinct. Basal cisterns are not effaced. No evidence of acute intracranial hemorrhage. No depressed skull fractures. Opacification of left maxillary antrum and some of the bilateral ethmoid air cells. Opacification of some of the mastoid air cells bilaterally. Calcification and deformity of the left globe. Vascular calcifications. CT CERVICAL SPINE FINDINGS Cervical spine examination is significantly limited due to motion artifact. There is mild anterior subluxation of C3 on C4. This is likely degenerative but ligamentous injury is not excluded. Diffuse degenerative change throughout the cervical spine with narrowed cervical interspaces and endplate hypertrophic changes. Degenerative changes throughout the facet joints. Bone encroachment upon neural foramina bilaterally at multiple levels. No prevertebral soft tissue swelling. No vertebral compression deformities. C1-2 articulation appears intact. IMPRESSION: No acute intracranial abnormalities. Chronic atrophy and small vessel ischemic changes. Opacification of some of the paranasal sinuses. Examination of cervical spine is significant limited due to motion artifact. There is no gross displaced fracture identified. However, there is anterior subluxation of C3 on C4. This is likely degenerative but ligamentous injury is not excluded. Diffuse degenerative changes throughout cervical spine. Electronically Signed   By: Lucienne Capers M.D.   On: 03/22/2015 21:13   Ct Cervical Spine Wo Contrast  03/22/2015  CLINICAL DATA:  Trip and fall injury, subsequent patient fell from stretcher onto the left side. Now with head and neck pain. Examination is limited as the patient had difficulty fitting into the gantry and was short of breath while laying flat. EXAM: CT HEAD WITHOUT  CONTRAST CT CERVICAL SPINE WITHOUT CONTRAST TECHNIQUE: Multidetector CT imaging of the head and cervical spine was performed following the standard protocol without intravenous contrast. Multiplanar CT image reconstructions of the cervical spine were also generated. COMPARISON:  CT head 12/24/2014 FINDINGS: CT HEAD FINDINGS Diffuse cerebral atrophy. Mild ventricular dilatation consistent with central atrophy. Low-attenuation changes in the deep white matter consistent with small vessel ischemia. Basal ganglia calcifications. No mass effect or midline shift. No abnormal extra-axial fluid collections. Gray-white matter junctions are distinct. Basal cisterns are not effaced. No evidence of  acute intracranial hemorrhage. No depressed skull fractures. Opacification of left maxillary antrum and some of the bilateral ethmoid air cells. Opacification of some of the mastoid air cells bilaterally. Calcification and deformity of the left globe. Vascular calcifications. CT CERVICAL SPINE FINDINGS Cervical spine examination is significantly limited due to motion artifact. There is mild anterior subluxation of C3 on C4. This is likely degenerative but ligamentous injury is not excluded. Diffuse degenerative change throughout the cervical spine with narrowed cervical interspaces and endplate hypertrophic changes. Degenerative changes throughout the facet joints. Bone encroachment upon neural foramina bilaterally at multiple levels. No prevertebral soft tissue swelling. No vertebral compression deformities. C1-2 articulation appears intact. IMPRESSION: No acute intracranial abnormalities. Chronic atrophy and small vessel ischemic changes. Opacification of some of the paranasal sinuses. Examination of cervical spine is significant limited due to motion artifact. There is no gross displaced fracture identified. However, there is anterior subluxation of C3 on C4. This is likely degenerative but ligamentous injury is not excluded.  Diffuse degenerative changes throughout cervical spine. Electronically Signed   By: Lucienne Capers M.D.   On: 03/22/2015 21:13   Mr Brain Wo Contrast  04/17/2015  CLINICAL DATA:  Acute encephalopathy. History of CHF, atrial fibrillation, diabetes, morbid obesity, chronic kidney disease. EXAM: MRI HEAD WITHOUT CONTRAST TECHNIQUE: Multiplanar, multiecho pulse sequences of the brain and surrounding structures were obtained without intravenous contrast. COMPARISON:  CT head March 22, 2015 FINDINGS: Multiple sequences are moderately or severely motion degraded. No reduced diffusion to suggest acute ischemia. Extremely motion degraded SWAN sequence with possible susceptibility artifact in RIGHT parietal sulci. No corroborative abnormality on the remaining sequences. Ventricles and sulci are normal for patient's age. At least patchy supratentorial white matter FLAIR T2 hyperintensities without midline shift or mass effect. Symmetric mildly prominent bifrontal extra-axial spaces favor underlying parenchymal volume loss, less likely chronic subdural hematomas without mass effect. Major intracranial vascular flow voids seen at the skull base. LEFT phthisis bulbi. LEFT maxillary sinus mucosal thickening. Bilateral mastoid effusions. Limited assessment for bone marrow signal abnormality due to motion degraded T1 sequences. IMPRESSION: Motion degraded examination. Equivocal findings for susceptibility artifact in the sulci which could be artifact or represent subarachnoid hemorrhage, less favored. As patient had difficulty remaining still, recommend follow-up CT head or, repeat MRI of the brain with sedation. Involutional changes. Suspected moderate chronic small vessel ischemic disease. Electronically Signed   By: Elon Alas M.D.   On: 04/17/2015 03:16   Mr Cervical Spine Wo Contrast  03/23/2015  CLINICAL DATA:  Status post trip and fall 03/22/2015. Increasing diffuse weakness. Question ligamentous injury at  C3-4. Initial encounter. EXAM: MRI CERVICAL SPINE WITHOUT CONTRAST TECHNIQUE: Multiplanar, multisequence MR imaging of the cervical spine was performed. No intravenous contrast was administered. COMPARISON:  CT cervical spine 03/22/2015. FINDINGS: The study is degraded by patient motion. Vertebral body height and signal are maintained. Trace anterolisthesis C3 on C4 is due to facet arthropathy. No evidence of ligamentous injury is seen. The craniocervical junction is normal and cervical cord signal is normal. Imaged paraspinous structures are unremarkable. Mucosal thickening is noted in the right maxillary sinus. C2-3: Facet degenerative disease is seen. The central canal and foramina appear open. C3-4: Facet arthropathy is seen. The disc is uncovered without bulging. The central canal is open. There is likely moderate bilateral foraminal narrowing although evaluation is degraded by motion. C4-5: Mild posterior bony ridging and uncovertebral disease. The central canal is open. There appears to be bilateral foraminal narrowing. C5-6: Small disc osteophyte  complex, facet arthropathy and uncovertebral disease are seen. The ventral thecal sac is nearly effaced. The foramina are narrowed. C6-7: Small disc osteophyte complex effaces the ventral thecal sac. Uncovertebral disease appears worse on the left there is left worse than right foraminal narrowing. C7-T1: Minimal disc bulge without central canal or foraminal stenosis. IMPRESSION: Trace anterolisthesis C3 on C4 seen on comparison CT scan is due to facet degenerative disease. No evidence of posttraumatic change is present on this examination. Mild multilevel disc bulging as described above most notable at C5-6 and C6-7 where the ventral thecal sac is effaced. There appears to be multilevel foraminal narrowing. Evaluation is limited by patient motion. Electronically Signed   By: Inge Rise M.D.   On: 03/23/2015 13:07   US Renal  03/24/2015  CLINICAL DATA:   Acute renal insufficiency EXAM: RENAL / URINARY TRACT ULTRASOUND COMPLETE COMPARISON:  None. FINDINGS: Right Kidney: Length: 14 cm in length. There is cortical thinning probable due to atrophy. No hydronephrosis. No renal calculus. Left Kidney: Not identified due to patient's large body habitus. Bladder: There is a Foley catheter within decompressed urinary bladder. IMPRESSION: No right hydronephrosis. Cortical thinning probable due to atrophy. There left kidney is not identified. Foley catheter within decompressed urinary bladder. Electronically Signed   By: Lahoma Crocker M.D.   On: 03/24/2015 18:34   Dg Chest Port 1 View  04/17/2015  CLINICAL DATA:  Shortness of breath today. EXAM: PORTABLE CHEST 1 VIEW COMPARISON:  Single view of the chest 04/13/2015, 03/23/2015 08/16/2014. FINDINGS: There is cardiomegaly and vascular congestion without frank edema. Left basilar atelectasis is noted. No pneumothorax. Aortic atherosclerosis is seen. IMPRESSION: Cardiomegaly and pulmonary vascular congestion. Electronically Signed   By: Inge Rise M.D.   On: 04/17/2015 08:02   Dg Chest Port 1 View  04/13/2015  CLINICAL DATA:  81 year old male with acute shortness of breath. EXAM: PORTABLE CHEST 1 VIEW COMPARISON:  03/23/2015 and prior chest radiographs FINDINGS: Cardiomegaly and mild pulmonary vascular congestion noted. Bibasilar opacities likely represent atelectasis. There is no evidence of pneumothorax. No acute bony abnormalities are identified. IMPRESSION: Cardiomegaly, mild pulmonary vascular congestion and probable bibasilar atelectasis. Electronically Signed   By: Margarette Canada M.D.   On: 04/13/2015 17:41   Dg Chest Port 1 View  03/23/2015  CLINICAL DATA:  Shortness of breath, acute renal failure. EXAM: PORTABLE CHEST 1 VIEW COMPARISON:  December 24, 2014. FINDINGS: Stable cardiomegaly is noted. No pneumothorax or significant pleural effusion is noted. Mild diffuse interstitial densities are noted throughout both  lungs, right greater than left, concerning for mild pulmonary edema. Stable mild central pulmonary vascular congestion is noted. Bony thorax is unremarkable. IMPRESSION: Stable cardiomegaly and central pulmonary vascular congestion is noted, with mild diffuse interstitial densities throughout both lungs concerning for pulmonary edema. Electronically Signed   By: Marijo Conception, M.D.   On: 03/23/2015 07:46   Dg Knee Complete 4 Views Right  03/22/2015  CLINICAL DATA:  Initial evaluation for acute trauma, fall. EXAM: RIGHT KNEE - COMPLETE 4+ VIEW COMPARISON:  None. FINDINGS: Diffuse osteopenia present, somewhat limiting evaluation for subtle nondisplaced fractures. No acute fracture or dislocation identified. No joint effusion. Severe tricompartmental degenerative osteoarthrosis. Prepatellar soft tissue swelling. IMPRESSION: 1. No acute fracture or dislocation. 2. Prepatellar soft tissue swelling. 3. Severe tricompartmental degenerative osteoarthrosis. 4. Diffuse osteopenia. Electronically Signed   By: Jeannine Boga M.D.   On: 03/22/2015 21:15   Dg Hand Complete Left  03/22/2015  CLINICAL DATA:  Status post fall  at home while walking. Left hand swelling. Initial encounter. EXAM: LEFT HAND - COMPLETE 3+ VIEW COMPARISON:  None. FINDINGS: There is no evidence of fracture or dislocation. Joint space narrowing is noted at the first carpometacarpal joint, and there is mild diffuse osteopenia of visualized osseous structures. The carpal rows are intact, and demonstrate normal alignment. Diffuse soft tissue swelling is noted about the wrist. IMPRESSION: No evidence of fracture or dislocation. Electronically Signed   By: Garald Balding M.D.   On: 03/22/2015 21:17   Dg Hand Complete Right  03/22/2015  CLINICAL DATA:  Patient fell at home while walking. Swelling in both hands. EXAM: RIGHT HAND - COMPLETE 3+ VIEW COMPARISON:  None. FINDINGS: Degenerative changes throughout the joints of the right hand and  wrist. No evidence of acute fracture or subluxation. No focal bone lesion or bone destruction. Bone cortex and trabecular architecture appear intact. No radiopaque soft tissue foreign bodies. IMPRESSION: Prominent diffuse degenerative changes throughout the right hand and wrist. No acute fractures identified. Electronically Signed   By: Lucienne Capers M.D.   On: 03/22/2015 21:16    CBC  Recent Labs Lab 04/13/15 1636 04/15/15 0340 04/17/15 0415 04/18/15 0735 04/20/15 0519  WBC 5.4 4.1 4.9 5.0 5.6  HGB 8.1* 8.1* 8.6* 8.3* 8.4*  HCT 27.8* 27.0* 29.1* 27.6* 28.8*  PLT 101* 84* 67* 70* 83*  MCV 94.9 94.7 95.7 96.2 97.6  MCH 27.6 28.4 28.3 28.9 28.5  MCHC 29.1* 30.0 29.6* 30.1 29.2*  RDW 24.1* 23.9* 23.6* 23.5* 23.8*    Chemistries   Recent Labs Lab 04/16/15 0501 04/17/15 0415 04/18/15 0735 04/19/15 0338 04/20/15 0519  NA 139 140 139 140 139  K 3.6 3.3* 3.2* 3.3* 3.4*  CL 100* 101 99* 98* 97*  CO2 28 30 31  32 34*  GLUCOSE 124* 120* 120* 113* 153*  BUN 59* 60* 60* 56* 55*  CREATININE 2.55* 2.43* 2.33* 2.15* 1.96*  CALCIUM 9.0 9.4 9.2 9.4 9.3  AST 26 25 21   --   --   ALT 17 17 14*  --   --   ALKPHOS 64 67 59  --   --   BILITOT 2.3* 3.1* 1.9*  --   --    ------------------------------------------------------------------------------------------------------------------ estimated creatinine clearance is 47 mL/min (by C-G formula based on Cr of 1.96). ------------------------------------------------------------------------------------------------------------------ No results for input(s): HGBA1C in the last 72 hours. ------------------------------------------------------------------------------------------------------------------ No results for input(s): CHOL, HDL, LDLCALC, TRIG, CHOLHDL, LDLDIRECT in the last 72 hours. ------------------------------------------------------------------------------------------------------------------ No results for input(s): TSH, T4TOTAL,  T3FREE, THYROIDAB in the last 72 hours.  Invalid input(s): FREET3 ------------------------------------------------------------------------------------------------------------------ No results for input(s): VITAMINB12, FOLATE, FERRITIN, TIBC, IRON, RETICCTPCT in the last 72 hours.  Coagulation profile No results for input(s): INR, PROTIME in the last 168 hours.  No results for input(s): DDIMER in the last 72 hours.  Cardiac Enzymes No results for input(s): CKMB, TROPONINI, MYOGLOBIN in the last 168 hours.  Invalid input(s): CK ------------------------------------------------------------------------------------------------------------------ Invalid input(s): POCBNP   Recent Labs  04/19/15 0745 04/19/15 1122 04/19/15 1640 04/19/15 2100 04/20/15 0730 04/20/15 1121  GLUCAP 103* 159* 118* 139* 142* 181*     RAI,RIPUDEEP M.D. [facility] 04/20/2015, 1:14 PM  Pager: [phone] Between 7am to 7pm - call Pager - [phone]  After 7pm go to [email or web address] - password [email or web address]  Call night coverage person covering after 7pm

## 2015-04-21 DIAGNOSIS — E662 Morbid (severe) obesity with alveolar hypoventilation: Secondary | ICD-10-CM

## 2015-04-21 LAB — CBC
HEMATOCRIT: 27.5 % — AB (ref 39.0–52.0)
Hemoglobin: 8.2 g/dL — ABNORMAL LOW (ref 13.0–17.0)
MCH: 29 pg (ref 26.0–34.0)
MCHC: 29.8 g/dL — AB (ref 30.0–36.0)
MCV: 97.2 fL (ref 78.0–100.0)
Platelets: 73 10*3/uL — ABNORMAL LOW (ref 150–400)
RBC: 2.83 MIL/uL — ABNORMAL LOW (ref 4.22–5.81)
RDW: 24 % — AB (ref 11.5–15.5)
WBC: 5.5 10*3/uL (ref 4.0–10.5)

## 2015-04-21 LAB — BASIC METABOLIC PANEL
Anion gap: 9 (ref 5–15)
BUN: 53 mg/dL — AB (ref 6–20)
CHLORIDE: 96 mmol/L — AB (ref 101–111)
CO2: 34 mmol/L — ABNORMAL HIGH (ref 22–32)
Calcium: 9.4 mg/dL (ref 8.9–10.3)
Creatinine, Ser: 1.98 mg/dL — ABNORMAL HIGH (ref 0.61–1.24)
GFR calc Af Amer: 35 mL/min — ABNORMAL LOW (ref >60)
GFR calc non Af Amer: 30 mL/min — ABNORMAL LOW (ref >60)
GLUCOSE: 123 mg/dL — AB (ref 65–99)
POTASSIUM: 3.7 mmol/L (ref 3.5–5.1)
Sodium: 139 mmol/L (ref 135–145)

## 2015-04-21 LAB — GLUCOSE, CAPILLARY
Glucose-Capillary: 116 mg/dL — ABNORMAL HIGH (ref 65–99)
Glucose-Capillary: 164 mg/dL — ABNORMAL HIGH (ref 65–99)
Glucose-Capillary: 167 mg/dL — ABNORMAL HIGH (ref 65–99)
Glucose-Capillary: 170 mg/dL — ABNORMAL HIGH (ref 65–99)

## 2015-04-21 LAB — AMMONIA: Ammonia: 47 umol/L — ABNORMAL HIGH (ref 9–35)

## 2015-04-21 NOTE — Plan of Care (Signed)
Problem: Education: Goal: Ability to demonstrate managment of disease process will improve Outcome: Progressing Pt understands for short term, Would benefit from family support when explaining procedures, tests, and diagnosis

## 2015-04-21 NOTE — Progress Notes (Signed)
Patient Name: [name] Date of Encounter: 04/21/2015  Principal Problem:   Acute on chronic diastolic CHF (congestive heart failure) ([place]) Active Problems:   Morbid obesity-BMI 47   Essential hypertension, benign   CAD- mild non obstructive Oct 2006   CKD (chronic kidney disease) stage 3, GFR 30-59 ml/min   Chronic anemia   Thrombocytopenia (HCC)   IDDM (insulin dependent diabetes mellitus) ([place])   Atrial fibrillation (HCC)   OSA treated with BiPAP   Encounter for palliative care   Altered mental state   Palliative care encounter   Length of Stay: 8  SUBJECTIVE  Weight unchanged, but substantial diuresis continues: -12L since admission. Denies dyspnea or angina, very wobbly weak leg muscles   CURRENT MEDS . antiseptic oral rinse  7 mL Mouth Rinse q12n4p  . calcium-vitamin D  1 tablet Oral BID  . chlorhexidine  15 mL Mouth Rinse BID  . furosemide  80 mg Intravenous BID  . insulin aspart  0-5 Units Subcutaneous QHS  . insulin aspart  0-9 Units Subcutaneous TID WC  . labetalol  200 mg Oral BID  . lactulose  20 g Oral BID  . latanoprost  1 drop Right Eye QHS  . loratadine  10 mg Oral Daily  . multivitamin with minerals  1 tablet Oral Daily  . potassium chloride  20 mEq Oral Daily  . sertraline  25 mg Oral QHS  . sodium chloride  3 mL Intravenous Q12H  . spironolactone  25 mg Oral Daily  . tamsulosin  0.4 mg Oral QHS  . vitamin B-12  1,000 mcg Oral Daily    OBJECTIVE   Intake/Output Summary (Last 24 hours) at 04/21/15 1154 Last data filed at 04/21/15 0945  Gross per 24 hour  Intake    840 ml  Output   2461 ml  Net  -1621 ml   Filed Weights   04/19/15 0500 04/20/15 0408 04/21/15 0500  Weight: 359 lb 14.4 oz (163.25 kg) 355 lb 8 oz (161.254 kg) 355 lb 6.1 oz (161.2 kg)    PHYSICAL EXAM Filed Vitals:   04/20/15 2348 04/21/15 0100 04/21/15 0442 04/21/15 0500  BP: 101/48  118/55   Pulse: 9 63 77   Temp:   98.3 F (36.8 C)   TempSrc:   Oral   Resp:  21 14 23    Height:      Weight:    355 lb 6.1 oz (161.2 kg)  SpO2:  94% 90%    General: Alert, oriented x3, no distress; witnessed hypoventilation/brief apnea before waking him up. Morbid obesity limits examination Head: no evidence of trauma, PERRL, EOMI, no exophtalmos or lid lag, no myxedema, no xanthelasma; normal ears, nose and oropharynx Neck: unable to assess jugular venous pulsations; brisk carotid pulses without delay and no carotid bruits Chest: clear to auscultation, no signs of consolidation by percussion or palpation, normal fremitus, symmetrical and full respiratory excursions Cardiovascular:unable to locate the apical impulse, regular rhythm, normal first and second heart sounds, no rubs or gallops, no murmur Abdomen: morbid obesity/distended, normal bowel sounds Extremities: severe chronic brawny edema of both legs, but soft and showing a few wrinkles; 2+ radial, ulnar and brachial pulses bilaterally;unable to locate lower extremity pulses Neurological: grossly nonfocal  LABS  CBC  Recent Labs  04/20/15 0519 04/21/15 0229  WBC 5.6 5.5  HGB 8.4* 8.2*  HCT 28.8* 27.5*  MCV 97.6 97.2  PLT 83* 73*   Basic Metabolic Panel  Recent Labs  04/20/15 0519  04/21/15 0229  NA 139 139  K 3.4* 3.7  CL 97* 96*  CO2 34* 34*  GLUCOSE 153* 123*  BUN 55* 53*  CREATININE 1.96* 1.98*  CALCIUM 9.3 9.4    Radiology Studies Imaging results have been reviewed and No results found.  TELE SR with occ PACs  ECG NSR, nonspecific IVCD  ASSESSMENT AND PLAN  1. Acute on chronic diastolic heart failure, predominantly right heart failure. LVEF 45-50% in April. Now 60%, with grade 2 diatsolic dysfunction and elevated filling pressures. Severe pulmonary hypertension. Evidence of marked fluid overload and lymphedema. This is complicated by renal insufficiency, anemia, andparoxysmal atrial fibrillation. Diuresing well. Renal function has improved after diuresis  2. Severe acute on  chronic cor pulmonale/pulmonary HTN , primarily due to OSA and obesity-hypoventilation sd. ([place] sd.). Should wear CPAP during the day if he naps, as well as at night.  3. Acute on chronic renal failure, last creatinine was 2.8 in November, now improved to roughly 2.0. He follows with Dr. Justin Mend.  4. History of paroxysmal atrial fibrillation documented by ECG in November, uncertain duration. Very complex patient.Agree with Dr. Mare Ferrari: "At this point he would not appear to be a good candidate for long-term anticoagulation because of his dementia and multiple comorbidities:" The patient was now maintaining normal sinus rhythm  5. History of nonobstructive CAD based on prior assessment, no obvious angina.  6. Deconditioning/debility - will need SNF    Sanda Klein, MD, [facility] [phone] office [phone] pager 04/21/2015 11:54 AM

## 2015-04-21 NOTE — Plan of Care (Signed)
Problem: ICU Phase Progression Outcomes Goal: O2 sats trending toward baseline Outcome: Progressing Pt was on RA majority of day, Switched to [place] this afternoon. Pt desats when he is "Napping"

## 2015-04-21 NOTE — Progress Notes (Signed)
Pt tolerating 02 sats around 95% on 2L. Pt turned to side. Muscle rub applied. Pt states tylenol did help back pain. Will cont to monitor

## 2015-04-21 NOTE — Progress Notes (Signed)
[facility] Progress Note   CORDELL [name]  [id number]  DOB: Jun 06, 1931  DOA: 04/13/2015 PCP: Wende Neighbors, MD  Brief narrative: [name] is a 81 y.o. male a history of Diastolic CHF, Stage III CKD, Atrial Fibrillation, IDDM, HTN. OSA, Lymphedema, chronci leg edema who presents to the ED with complaints of worsening SOB, and edema of both legs for quite some time. -He was seen by his Cardiologist Dr. Domenic Polite and advised to go to the ED for hospitalization due to acute on chronic CHF.    Subjective: Laying flat in bed about to be clean by staff-is not short of breath that does not complain of cough  Assessment/Plan: Acute on chronic diastolic CHF.  - 2-D echo shows EF of 60%, with grade 2 diastolic and elevated filling pressures. Severe pulmonary hypertensin.  - Improving with diuretics-12 L negative balance -On metolazone and Aldactone at home-switch IV furosemide to oral today - Continue beta blocker, patient is not a good candidate for ACE/ARB due to renal issues.  Active problems Atrial fibrillation.  - Not a good candidate for Anticoagulant Rx. Per cardiology, complex patient, would not appear to be a good candidate for long-term anticoagulation because of his dementia and multiple comorbidities  IDDM.  - Hold Levemir due to borderline glucose. Hemoglobin A1c 5.9 .  - Continue sliding scale insulin   OSA treated with BiPAP. - BIPAP qhs and PRN  Morbid obesity-BMI 51.  - Patient counseled on diet and weight control   CKD stage III- IV -Stable  Anemia and thrombocytopenia -Chronic-iron saturation level low- ferritin level low normal-start iron replacement- - folate and B-12 elevated  Acute encephalopathy - Mental status worsening since 12/20 with a new level of 80 which has improved with lactulose -Check CT of the liver for cirrhosis  Severe lymphedema -Left leg larger than right leg-chronic     Code Status:     Code  Status Orders        Start     Ordered   04/17/15 1633  Do not attempt resuscitation (DNR)   Continuous    Question Answer Comment  In the event of cardiac or respiratory ARREST Do not call a "code blue"   In the event of cardiac or respiratory ARREST Do not perform Intubation, CPR, defibrillation or ACLS   In the event of cardiac or respiratory ARREST Use medication by any route, position, wound care, and other measures to relive pain and suffering. May use oxygen, suction and manual treatment of airway obstruction as needed for comfort.      04/17/15 1632     Family Communication:  Disposition Plan: SNF DVT prophylaxis: SCDs Consultants: Procedures: 2D echo  Antibiotics: Anti-infectives    None      Objective: Filed Weights   04/19/15 0500 04/20/15 0408 04/21/15 0500  Weight: 163.25 kg (359 lb 14.4 oz) 161.254 kg (355 lb 8 oz) 161.2 kg (355 lb 6.1 oz)    Intake/Output Summary (Last 24 hours) at 04/21/15 1556 Last data filed at 04/21/15 1416  Gross per 24 hour  Intake    720 ml  Output   2011 ml  Net  -1291 ml     Vitals Filed Vitals:   04/21/15 0100 04/21/15 0442 04/21/15 0500 04/21/15 1416  BP:  118/55  124/49  Pulse: 63 77  63  Temp:  98.3 F (36.8 C)  98.1 F (36.7 C)  TempSrc:  Oral    Resp: 14 23  21   Height:  Weight:   161.2 kg (355 lb 6.1 oz)   SpO2: 94% 90%  97%    Exam:  General:  Pt is alert, not in acute distress morbidly obese  HEENT: No icterus, No thrush, oral mucosa moist  Cardiovascular: regular rate and rhythm, S1/S2 No murmur  Respiratory: clear to auscultation bilaterally   Abdomen: Soft, +Bowel sounds, non tender, non distended, no guarding  MSK: cyanosis or clubbing bilateral venous stasis changes/lymphedema -left leg larger than right leg  Data Reviewed: Basic Metabolic Panel:  Recent Labs Lab 04/17/15 0415 04/18/15 0735 04/19/15 0338 04/20/15 0519 04/21/15 0229  NA 140 139 140 139 139  K 3.3* 3.2* 3.3* 3.4*  3.7  CL 101 99* 98* 97* 96*  CO2 30 31 32 34* 34*  GLUCOSE 120* 120* 113* 153* 123*  BUN 60* 60* 56* 55* 53*  CREATININE 2.43* 2.33* 2.15* 1.96* 1.98*  CALCIUM 9.4 9.2 9.4 9.3 9.4   Liver Function Tests:  Recent Labs Lab 04/16/15 0501 04/17/15 0415 04/18/15 0735  AST 26 25 21   ALT 17 17 14*  ALKPHOS 64 67 59  BILITOT 2.3* 3.1* 1.9*  PROT 5.5* 5.3* 5.1*  ALBUMIN 2.9* 2.9* 2.7*   No results for input(s): LIPASE, AMYLASE in the last 168 hours.  Recent Labs Lab 04/16/15 1624 04/17/15 1645 04/18/15 0735 04/20/15 0519 04/21/15 0229  AMMONIA 80* 98* 74* 64* 47*   CBC:  Recent Labs Lab 04/15/15 0340 04/17/15 0415 04/18/15 0735 04/20/15 0519 04/21/15 0229  WBC 4.1 4.9 5.0 5.6 5.5  HGB 8.1* 8.6* 8.3* 8.4* 8.2*  HCT 27.0* 29.1* 27.6* 28.8* 27.5*  MCV 94.7 95.7 96.2 97.6 97.2  PLT 84* 67* 70* 83* 73*   Cardiac Enzymes: No results for input(s): CKTOTAL, CKMB, CKMBINDEX, TROPONINI in the last 168 hours. BNP (last 3 results)  Recent Labs  08/16/14 1945 04/13/15 1646  BNP 869.7* 798.0*    ProBNP (last 3 results) No results for input(s): PROBNP in the last 8760 hours.  CBG:  Recent Labs Lab 04/20/15 1121 04/20/15 1644 04/20/15 2058 04/21/15 0741 04/21/15 1137  GLUCAP 181* 136* 157* 116* 164*    Recent Results (from the past 240 hour(s))  MRSA PCR Screening     Status: None   Collection Time: 04/13/15 10:31 PM  Result Value Ref Range Status   MRSA by PCR NEGATIVE NEGATIVE Final    Comment:        The GeneXpert MRSA Assay (FDA approved for NASAL specimens only), is one component of a comprehensive MRSA colonization surveillance program. It is not intended to diagnose MRSA infection nor to guide or monitor treatment for MRSA infections.   Culture, blood (Routine X 2) w Reflex to ID Panel     Status: None (Preliminary result)   Collection Time: 04/20/15  3:23 PM  Result Value Ref Range Status   Specimen Description BLOOD BLOOD RIGHT HAND  Final    Special Requests BOTTLES DRAWN AEROBIC ONLY 5CC  Final   Culture NO GROWTH < 24 HOURS  Final   Report Status PENDING  Incomplete  Culture, blood (Routine X 2) w Reflex to ID Panel     Status: None (Preliminary result)   Collection Time: 04/20/15  3:30 PM  Result Value Ref Range Status   Specimen Description BLOOD BLOOD RIGHT ARM  Final   Special Requests BOTTLES DRAWN AEROBIC AND ANAEROBIC 6CC  Final   Culture NO GROWTH < 24 HOURS  Final   Report Status PENDING  Incomplete  Studies: No results found.  Scheduled Meds:  Scheduled Meds: . antiseptic oral rinse  7 mL Mouth Rinse q12n4p  . calcium-vitamin D  1 tablet Oral BID  . chlorhexidine  15 mL Mouth Rinse BID  . furosemide  80 mg Intravenous BID  . insulin aspart  0-5 Units Subcutaneous QHS  . insulin aspart  0-9 Units Subcutaneous TID WC  . labetalol  200 mg Oral BID  . lactulose  20 g Oral BID  . latanoprost  1 drop Right Eye QHS  . loratadine  10 mg Oral Daily  . multivitamin with minerals  1 tablet Oral Daily  . potassium chloride  20 mEq Oral Daily  . sertraline  25 mg Oral QHS  . sodium chloride  3 mL Intravenous Q12H  . spironolactone  25 mg Oral Daily  . tamsulosin  0.4 mg Oral QHS  . vitamin B-12  1,000 mcg Oral Daily   Continuous Infusions:   Time spent on care of this patient: 35 min   [place], MD 04/21/2015, 3:56 PM  LOS: 8 days   [facility] Office  [phone] Pager - Text Page per [email or web address] If 7PM-7AM, please contact night-coverage [email or web address]

## 2015-04-22 ENCOUNTER — Inpatient Hospital Stay (HOSPITAL_COMMUNITY): Payer: Medicare Other

## 2015-04-22 DIAGNOSIS — I480 Paroxysmal atrial fibrillation: Secondary | ICD-10-CM

## 2015-04-22 DIAGNOSIS — K7460 Unspecified cirrhosis of liver: Secondary | ICD-10-CM | POA: Insufficient documentation

## 2015-04-22 LAB — GLUCOSE, CAPILLARY
GLUCOSE-CAPILLARY: 165 mg/dL — AB (ref 65–99)
Glucose-Capillary: 128 mg/dL — ABNORMAL HIGH (ref 65–99)
Glucose-Capillary: 131 mg/dL — ABNORMAL HIGH (ref 65–99)
Glucose-Capillary: 157 mg/dL — ABNORMAL HIGH (ref 65–99)

## 2015-04-22 LAB — URINE CULTURE: Culture: >=100000

## 2015-04-22 MED ORDER — FERROUS SULFATE 325 (65 FE) MG PO TABS
325.0000 mg | ORAL_TABLET | Freq: Two times a day (BID) | ORAL | Status: DC
  Administered 2015-04-22 – 2015-04-24 (×4): 325 mg via ORAL
  Filled 2015-04-22 (×4): qty 1

## 2015-04-22 MED ORDER — RIFAXIMIN 550 MG PO TABS
550.0000 mg | ORAL_TABLET | Freq: Two times a day (BID) | ORAL | Status: DC
  Administered 2015-04-22 – 2015-04-24 (×5): 550 mg via ORAL
  Filled 2015-04-22 (×8): qty 1

## 2015-04-22 NOTE — Progress Notes (Addendum)
[facility] Progress Note   [name]  [id number]  DOB: 25-Feb-1932  DOA: 04/13/2015 PCP: Wende Neighbors, MD  Brief narrative: [name] is a 81 y.o. male a history of morbid obesity, Diastolic CHF, Stage III CKD, Atrial Fibrillation, IDDM, HTN. OSA, Lymphedema,  who presents to the ED with complaints of worsening SOB. He was seen by his Cardiologist, Dr. Domenic Polite, and advised to go to the ED for hospitalization due to acute on chronic CHF.  Subjective: No complaints. Discussed his new diagnosis of cirrhosis in detail.    Assessment/Plan: Acute on chronic diastolic CHF.  - 2-D echo shows EF of 60%, with grade 2 diastolic and elevated filling pressures. Severe pulmonary hypertensin.  - Improving with diuretics-13 L negative balance -On metolazone and Aldactone at home already-  - Lasix held on 12/24 to prevent over diuresis- still on Aldactone - Continue beta blocker- patient is not a good candidate for ACE/ARB due CKD 3  Active problems Atrial fibrillation- old diagnosis  - Not a good candidate for Anticoagulant Rx. Per cardiology, complex patient, would not appear to be a good candidate for long-term anticoagulation because of his dementia and multiple comorbidities (high fall risk)  IDDM.  - Hold Levemir due to borderline glucose. Hemoglobin A1c 5.9 .  - Continue sliding scale insulin   OSA treated with BiPAP. - BIPAP qhs and PRN  Morbid obesity-BMI 51.  - Patient counseled on diet and weight control   CKD stage III- IV -Stable  Anemia- Iron deficiency  -Chronic-iron saturation level low- ferritin level low normal-started iron replacement- - folate and B-12 elevated  Hepatic encephalopathy - Mental status worsening since 12/20 with ammonia level of 80 which has improved with lactulose -Checked CT of the liver for cirrhosis- see below  Cirrhosis - new diagnosis this admission - f/u hepatitis serology- NASH is also a possibility -  cont lactulose and add Xifaxin for hepatic encephalopathy - has portal HTN and splenomegaly-    Thrombocytopenia - likely due to cirrhosis  Severe lymphedema -Left leg larger than right leg-chronic     Code Status:     Code Status Orders        Start     Ordered   04/17/15 1633  Do not attempt resuscitation (DNR)   Continuous    Question Answer Comment  In the event of cardiac or respiratory ARREST Do not call a "code blue"   In the event of cardiac or respiratory ARREST Do not perform Intubation, CPR, defibrillation or ACLS   In the event of cardiac or respiratory ARREST Use medication by any route, position, wound care, and other measures to relive pain and suffering. May use oxygen, suction and manual treatment of airway obstruction as needed for comfort.      04/17/15 1632     Family Communication: called wife- left message Disposition Plan: SNF DVT prophylaxis: SCDs Consultants: Cardiolgy, Palliative care Procedures: 2D echo  Antibiotics: Anti-infectives    Start     Dose/Rate Route Frequency Ordered Stop   04/22/15 1030  rifaximin (XIFAXAN) tablet 550 mg     550 mg Oral 2 times daily 04/22/15 0942        Objective: Filed Weights   04/20/15 0408 04/21/15 0500 04/22/15 0407  Weight: 161.254 kg (355 lb 8 oz) 161.2 kg (355 lb 6.1 oz) 163.3 kg (360 lb 0.2 oz)    Intake/Output Summary (Last 24 hours) at 04/22/15 1602 Last data filed at 04/22/15 1239  Gross per 24  hour  Intake   1200 ml  Output   1650 ml  Net   -450 ml     Vitals Filed Vitals:   04/21/15 2125 04/22/15 0407 04/22/15 1503 04/22/15 1504  BP:  120/60  125/45  Pulse: 69 70  70  Temp:  99.5 F (37.5 C) 98.9 F (37.2 C)   TempSrc:  Oral Oral   Resp: 20 20  22   Height:  6' (1.829 m)    Weight:  163.3 kg (360 lb 0.2 oz)    SpO2: 94% 98%  95%    Exam:  General:  Pt is alert, not in acute distress morbidly obese  HEENT: No icterus, No thrush, oral mucosa moist  Cardiovascular: regular  rate and rhythm, S1/S2 No murmur  Respiratory: clear to auscultation bilaterally   Abdomen: Soft, +Bowel sounds, non tender, non distended, no guarding  MSK: cyanosis or clubbing bilateral venous stasis changes/lymphedema -left leg larger than right leg  Data Reviewed: Basic Metabolic Panel:  Recent Labs Lab 04/17/15 0415 04/18/15 0735 04/19/15 0338 04/20/15 0519 04/21/15 0229  NA 140 139 140 139 139  K 3.3* 3.2* 3.3* 3.4* 3.7  CL 101 99* 98* 97* 96*  CO2 30 31 32 34* 34*  GLUCOSE 120* 120* 113* 153* 123*  BUN 60* 60* 56* 55* 53*  CREATININE 2.43* 2.33* 2.15* 1.96* 1.98*  CALCIUM 9.4 9.2 9.4 9.3 9.4   Liver Function Tests:  Recent Labs Lab 04/16/15 0501 04/17/15 0415 04/18/15 0735  AST 26 25 21   ALT 17 17 14*  ALKPHOS 64 67 59  BILITOT 2.3* 3.1* 1.9*  PROT 5.5* 5.3* 5.1*  ALBUMIN 2.9* 2.9* 2.7*   No results for input(s): LIPASE, AMYLASE in the last 168 hours.  Recent Labs Lab 04/16/15 1624 04/17/15 1645 04/18/15 0735 04/20/15 0519 04/21/15 0229  AMMONIA 80* 98* 74* 64* 47*   CBC:  Recent Labs Lab 04/17/15 0415 04/18/15 0735 04/20/15 0519 04/21/15 0229  WBC 4.9 5.0 5.6 5.5  HGB 8.6* 8.3* 8.4* 8.2*  HCT 29.1* 27.6* 28.8* 27.5*  MCV 95.7 96.2 97.6 97.2  PLT 67* 70* 83* 73*   Cardiac Enzymes: No results for input(s): CKTOTAL, CKMB, CKMBINDEX, TROPONINI in the last 168 hours. BNP (last 3 results)  Recent Labs  08/16/14 1945 04/13/15 1646  BNP 869.7* 798.0*    ProBNP (last 3 results) No results for input(s): PROBNP in the last 8760 hours.  CBG:  Recent Labs Lab 04/21/15 1743 04/21/15 2042 04/22/15 0617 04/22/15 0726 04/22/15 1142  GLUCAP 167* 170* 128* 131* 165*    Recent Results (from the past 240 hour(s))  MRSA PCR Screening     Status: None   Collection Time: 04/13/15 10:31 PM  Result Value Ref Range Status   MRSA by PCR NEGATIVE NEGATIVE Final    Comment:        The GeneXpert MRSA Assay (FDA approved for NASAL  specimens only), is one component of a comprehensive MRSA colonization surveillance program. It is not intended to diagnose MRSA infection nor to guide or monitor treatment for MRSA infections.   Culture, blood (Routine X 2) w Reflex to ID Panel     Status: None (Preliminary result)   Collection Time: 04/20/15  3:23 PM  Result Value Ref Range Status   Specimen Description BLOOD BLOOD RIGHT HAND  Final   Special Requests BOTTLES DRAWN AEROBIC ONLY 5CC  Final   Culture NO GROWTH < 24 HOURS  Final   Report Status PENDING  Incomplete  Culture, blood (Routine X 2) w Reflex to ID Panel     Status: None (Preliminary result)   Collection Time: 04/20/15  3:30 PM  Result Value Ref Range Status   Specimen Description BLOOD BLOOD RIGHT ARM  Final   Special Requests BOTTLES DRAWN AEROBIC AND ANAEROBIC 6CC  Final   Culture NO GROWTH < 24 HOURS  Final   Report Status PENDING  Incomplete  Urine culture     Status: None   Collection Time: 04/20/15  6:58 PM  Result Value Ref Range Status   Specimen Description URINE, CATHETERIZED  Final   Special Requests NONE  Final   Culture >=100,000 COLONIES/mL ESCHERICHIA COLI  Final   Report Status 05/18/2015 FINAL  Final   Organism ID, Bacteria ESCHERICHIA COLI  Final      Susceptibility   Escherichia coli - MIC*    AMPICILLIN <=2 SENSITIVE Sensitive     CEFAZOLIN <=4 SENSITIVE Sensitive     CEFTRIAXONE <=1 SENSITIVE Sensitive     CIPROFLOXACIN <=0.25 SENSITIVE Sensitive     GENTAMICIN <=1 SENSITIVE Sensitive     IMIPENEM <=0.25 SENSITIVE Sensitive     NITROFURANTOIN <=16 SENSITIVE Sensitive     TRIMETH/SULFA <=20 SENSITIVE Sensitive     AMPICILLIN/SULBACTAM <=2 SENSITIVE Sensitive     PIP/TAZO <=4 SENSITIVE Sensitive     * >=100,000 COLONIES/mL ESCHERICHIA COLI     Studies: Ct Abdomen Wo Contrast  May 18, 2015  CLINICAL DATA:  81 year old male with cirrhosis. EXAM: CT ABDOMEN WITHOUT CONTRAST TECHNIQUE: Multidetector CT imaging of the abdomen was  performed following the standard protocol without IV contrast. COMPARISON:  Renal ultrasound dated 03/24/2015 FINDINGS: Evaluation of this exam is limited in the absence of intravenous contrast. Partially visualized small bilateral pleural effusions with associated subsegmental atelectatic changes of the adjacent lung bases. Superimposed pneumonia is not excluded. Clinical correlation is recommended. Calcified mitral annulus. There is hypoattenuation of the cardiac blood pool compatible with a degree of anemia. Clinical correlation is recommended. No intra-abdominal free air or free fluid. Cirrhosis. The gallbladder is contracted and filled with stones. No pericholecystic fluid identified. Ultrasound may provide better evaluation of the gallbladder. The pancreas appears unremarkable. Splenomegaly measuring up to 22 cm in longest axis. The adrenal glands appear unremarkable. There are moderate bilateral renal atrophy. There is no hydronephrosis or nephrolithiasis on either side. There multiple left renal exophytic hypodense lesions measuring up to 5 cm in the upper pole and 4 cm in the lower pole. These lesions are not characterized on this noncontrast study. Ultrasound is recommended for further characterization. There is colonic diverticulosis. There is no dilatation or inflammatory changes of the visualized bowel. Normal appendix. Atherosclerotic calcification of the abdominal aorta. There is a 3.7 cm infrarenal abdominal aortic aneurysm no portal venous gas identified. Nodular densities anterior to the aorta may represent prominent vasculature or top-normal lymph nodes. There is diastases of anterior abdominal wall musculature in the midline with a broad-based ventral hernia containing loops of small bowel. There is stranding of the subcutaneous soft tissues of the left lateral pelvic wall. No drainable fluid collection/abscess identified. Degenerative changes of the spine. Lower lumbar disc desiccation with  vacuum phenomena. No acute fracture. IMPRESSION: Cirrhosis with evidence of portal hypertension and splenomegaly. Cholelithiasis. No hydronephrosis or nephrolithiasis. Multiple left renal hypodense lesions. Ultrasound is recommended for further evaluation. Colonic diverticulosis with no evidence of bowel obstruction or inflammation. Electronically Signed   By: Anner Crete M.D.   On: 05/18/2015 02:29    Scheduled  Meds:  Scheduled Meds: . antiseptic oral rinse  7 mL Mouth Rinse q12n4p  . calcium-vitamin D  1 tablet Oral BID  . chlorhexidine  15 mL Mouth Rinse BID  . insulin aspart  0-5 Units Subcutaneous QHS  . insulin aspart  0-9 Units Subcutaneous TID WC  . labetalol  200 mg Oral BID  . lactulose  20 g Oral BID  . latanoprost  1 drop Right Eye QHS  . loratadine  10 mg Oral Daily  . multivitamin with minerals  1 tablet Oral Daily  . potassium chloride  20 mEq Oral Daily  . rifaximin  550 mg Oral BID  . sertraline  25 mg Oral QHS  . sodium chloride  3 mL Intravenous Q12H  . spironolactone  25 mg Oral Daily  . tamsulosin  0.4 mg Oral QHS  . vitamin B-12  1,000 mcg Oral Daily   Continuous Infusions:   Time spent on care of this patient: 35 min   [place], MD 04/22/2015, 4:02 PM  LOS: 9 days   [facility] Office  [phone] Pager - Text Page per [email or web address] If 7PM-7AM, please contact night-coverage [email or web address]

## 2015-04-22 NOTE — Progress Notes (Signed)
Daily Progress Note   Patient Name: [name]       Date: 04/22/2015 DOB: [birth year]  Age: 81 y.o. MRN#: [id number] Attending Physician: Debbe Odea, MD Primary Care Physician: Wende Neighbors, MD Admit Date: 04/13/2015  Reason for Consultation/Follow-up: Establishing goals of care  Subjective:  Awake alert laying flat resting in bed does not appear to be dyspneic. No family at bedside. Length of Stay: 9 days  Current Medications: Scheduled Meds:  . antiseptic oral rinse  7 mL Mouth Rinse q12n4p  . calcium-vitamin D  1 tablet Oral BID  . chlorhexidine  15 mL Mouth Rinse BID  . insulin aspart  0-5 Units Subcutaneous QHS  . insulin aspart  0-9 Units Subcutaneous TID WC  . labetalol  200 mg Oral BID  . lactulose  20 g Oral BID  . latanoprost  1 drop Right Eye QHS  . loratadine  10 mg Oral Daily  . multivitamin with minerals  1 tablet Oral Daily  . potassium chloride  20 mEq Oral Daily  . rifaximin  550 mg Oral BID  . sertraline  25 mg Oral QHS  . sodium chloride  3 mL Intravenous Q12H  . spironolactone  25 mg Oral Daily  . tamsulosin  0.4 mg Oral QHS  . vitamin B-12  1,000 mcg Oral Daily    Continuous Infusions:    PRN Meds: sodium chloride, acetaminophen, MUSCLE RUB, ondansetron (ZOFRAN) IV, sodium chloride  Physical Exam: Physical Exam        General: Alert, awake, in no acute distress.  HEENT: No bruits, no goiter. Heart: Irregular. No murmur appreciated. Lungs: Fair air movement, clear Abdomen: Soft, nontender, obese, positive bowel sounds.  Ext: Bilateral edema Skin: Warm and dry Neuro: Grossly intact, nonfocal.        Vital Signs: BP 125/45 mmHg  Pulse 70  Temp(Src) 98.9 F (37.2 C) (Oral)  Resp 22  Ht 6' (1.829 m)  Wt 163.3 kg (360 lb 0.2 oz)  BMI  48.82 kg/m2  SpO2 95% SpO2: SpO2: 95 % O2 Device: O2 Device: Nasal Cannula O2 Flow Rate: O2 Flow Rate (L/min): 3 L/min  Intake/output summary:   Intake/Output Summary (Last 24 hours) at 04/22/15 1550 Last data filed at 04/22/15 1239  Gross per 24 hour  Intake   1200 ml  Output   1650 ml  Net   -450 ml   LBM: Last BM Date: 04/21/15 Baseline Weight: Weight: (!) 166.47 kg (367 lb) Most recent weight: Weight: (!) 163.3 kg (360 lb 0.2 oz) (pt unable to stand at this time)       Palliative Assessment/Data: Flowsheet Rows        Most Recent Value   Intake Tab    Referral Department  Cardiology   Unit at Time of Referral  Cardiac/Telemetry Unit   Palliative Care Primary Diagnosis  Cardiac   Date Notified  04/16/15   Palliative Care Type  New Palliative care   Reason for referral  Clarify Goals of Care   Date of Admission  04/13/15   Date first seen by Palliative Care  04/17/15   # of days Palliative referral response time  0 Day(s)   # of days IP prior to Palliative referral  3   Clinical Assessment    Palliative Performance Scale Score  20%   Pain Max last 24 hours  4   Pain Min Last 24 hours  3   Dyspnea Max Last 24 Hours  5   Dyspnea Min Last 24 hours  4   Psychosocial & Spiritual Assessment    Palliative Care Outcomes    Patient/Family meeting held?  Yes   Who was at the meeting?  wife daughter grand daughter    Palliative Care Outcomes  Clarified goals of care, Counseled regarding hospice   Palliative Care follow-up planned  Yes, Facility      Additional Data Reviewed: CBC    Component Value Date/Time   WBC 5.5 04/21/2015 0229   WBC 4.1 02/26/2015 1318   RBC 2.83* 04/21/2015 0229   RBC 3.07* 02/26/2015 1318   RBC 3.11* 08/17/2014 1151   HGB 8.2* 04/21/2015 0229   HGB 8.3* 02/26/2015 1318   HCT 27.5* 04/21/2015 0229   HCT 26.9* 02/26/2015 1318   PLT 73* 04/21/2015 0229   PLT 100* 02/26/2015 1318   MCV 97.2 04/21/2015 0229   MCV 87.6 02/26/2015 1318   MCH  29.0 04/21/2015 0229   MCH 27.0* 02/26/2015 1318   MCHC 29.8* 04/21/2015 0229   MCHC 30.9* 02/26/2015 1318   RDW 24.0* 04/21/2015 0229   RDW 17.1* 02/26/2015 1318   LYMPHSABS 1.0 03/23/2015 0644   LYMPHSABS 0.7* 02/26/2015 1318   MONOABS 0.6 03/23/2015 0644   MONOABS 0.7 02/26/2015 1318   EOSABS 0.2 03/23/2015 0644   EOSABS 0.3 02/26/2015 1318   BASOSABS 0.0 03/23/2015 0644   BASOSABS 0.0 02/26/2015 1318    CMP     Component Value Date/Time   NA 139 04/21/2015 0229   NA 141 02/26/2015 1318   K 3.7 04/21/2015 0229   K 4.1 02/26/2015 1318   CL 96* 04/21/2015 0229   CO2 34* 04/21/2015 0229   CO2 23 02/26/2015 1318   GLUCOSE 123* 04/21/2015 0229   GLUCOSE 126 02/26/2015 1318   BUN 53* 04/21/2015 0229   BUN 48.4* 02/26/2015 1318   CREATININE 1.98* 04/21/2015 0229   CREATININE 2.3* 02/26/2015 1318   CALCIUM 9.4 04/21/2015 0229   CALCIUM 9.5 02/26/2015 1318   PROT 5.1* 04/18/2015 0735   PROT 6.5 02/26/2015 1318   ALBUMIN 2.7* 04/18/2015 0735   ALBUMIN 3.7 02/26/2015 1318   AST 21 04/18/2015 0735   AST 16 02/26/2015 1318   ALT 14* 04/18/2015 0735   ALT 12 02/26/2015 1318   ALKPHOS 59 04/18/2015 0735   ALKPHOS 77  02/26/2015 1318   BILITOT 1.9* 04/18/2015 0735   BILITOT 1.15 02/26/2015 1318   GFRNONAA 30* 04/21/2015 0229   GFRAA 35* 04/21/2015 0229       Problem List:  Patient Active Problem List   Diagnosis Date Noted  . Palliative care encounter   . Altered mental state   . Encounter for palliative care   . IDDM (insulin dependent diabetes mellitus) ([place]) 04/13/2015  . Atrial fibrillation ([place]) 04/13/2015  . OSA treated with BiPAP 04/13/2015  . SOB (shortness of breath)   . AKI (acute kidney injury) ([place])   . Acute on chronic renal insufficiency ([place]) 03/23/2015  . Chronic anemia 03/23/2015  . Guaiac positive stools 03/23/2015  . Thrombocytopenia ([place]) 03/23/2015  . Renal failure (ARF), acute on chronic (HCC) 03/23/2015  . Pressure ulcer, stage 2, left  posterior thigh at crease of butt 08/20/2014  . Sleep apnea- on C-pap 08/19/2014  . Acute on chronic diastolic CHF (congestive heart failure), NYHA class 1 ([place]) 08/17/2014  . Acute on chronic diastolic CHF (congestive heart failure) ([place]) 08/16/2014  . Blindness of left eye 04/15/2013  . CKD (chronic kidney disease) stage 3, GFR 30-59 ml/min 04/14/2013  . Chronic pain syndrome 08/18/2012  . Chronic diastolic heart failure ([place]) 07/15/2012  . Acute on chronic diastolic heart failure ([place]) 10/08/2010  . Morbid obesity-BMI 51 05/30/2009  . Essential hypertension, benign 05/30/2009  . CAD- mild non obstructive Oct 2006 02/03/2009  . SVT/ PSVT/ PAT 02/03/2009  . Mixed hyperlipidemia 12/26/2008     Palliative Care Assessment & Plan    1.Code Status:  DNR    Code Status Orders        Start     Ordered   04/17/15 [id number]  Do not attempt resuscitation (DNR)   Continuous    Question Answer Comment  In the event of cardiac or respiratory ARREST Do not call a "code blue"   In the event of cardiac or respiratory ARREST Do not perform Intubation, CPR, defibrillation or ACLS   In the event of cardiac or respiratory ARREST Use medication by any route, position, wound care, and other measures to relive pain and suffering. May use oxygen, suction and manual treatment of airway obstruction as needed for comfort.      04/17/15 1632       2. Goals of Care/Additional Recommendations: Code discussions: DNR DNI Goals of care: continue current scope of treatment: attempts at diuresis, monitor renal function. If no clinical improvement, family is agreeable to complete comfort care. At this point, it appears to me that the patient continues to improve clinically and has been diuresing well.  - Follow-up tomorrow.   3. Symptom Management:      1. Denies any active symptoms during our encounter  4. Palliative Prophylaxis:   Bowel Regimen and Delirium Protocol  5. Prognosis: Unable to  determine, but he appears to be clinically improving  6. Discharge Planning:  [facility] for rehab with Palliative care service follow-up most likely   Care plan was discussed with patient  Thank you for allowing the Palliative Medicine Team to assist in the care of this patient.   Time In: [id number] Time Out: 1510 Total Time 15 Prolonged Time Billed no        Loistine Chance, MD  04/22/2015, 3:50 PM  Please contact Palliative Medicine Team phone at [phone] for questions and concerns.

## 2015-04-22 NOTE — Progress Notes (Signed)
Patient Name: [name] Date of Encounter: 04/22/2015  Principal Problem:   Acute on chronic diastolic CHF (congestive heart failure) ([place]) Active Problems:   Morbid obesity-BMI 22   Essential hypertension, benign   CAD- mild non obstructive Oct 2006   CKD (chronic kidney disease) stage 3, GFR 30-59 ml/min   Chronic anemia   Thrombocytopenia (HCC)   IDDM (insulin dependent diabetes mellitus) ([place])   Atrial fibrillation (HCC)   OSA treated with BiPAP   Encounter for palliative care   Altered mental state   Palliative care encounter   Length of Stay: 9  SUBJECTIVE  Looks comfortable lying fully flat in bed.  CURRENT MEDS . antiseptic oral rinse  7 mL Mouth Rinse q12n4p  . calcium-vitamin D  1 tablet Oral BID  . chlorhexidine  15 mL Mouth Rinse BID  . insulin aspart  0-5 Units Subcutaneous QHS  . insulin aspart  0-9 Units Subcutaneous TID WC  . labetalol  200 mg Oral BID  . lactulose  20 g Oral BID  . latanoprost  1 drop Right Eye QHS  . loratadine  10 mg Oral Daily  . multivitamin with minerals  1 tablet Oral Daily  . potassium chloride  20 mEq Oral Daily  . sertraline  25 mg Oral QHS  . sodium chloride  3 mL Intravenous Q12H  . spironolactone  25 mg Oral Daily  . tamsulosin  0.4 mg Oral QHS  . vitamin B-12  1,000 mcg Oral Daily    OBJECTIVE   Intake/Output Summary (Last 24 hours) at 04/22/15 0932 Last data filed at 04/22/15 [id number]  Gross per 24 hour  Intake   1560 ml  Output   2050 ml  Net   -490 ml   Filed Weights   04/20/15 0408 04/21/15 0500 04/22/15 0407  Weight: 355 lb 8 oz (161.254 kg) 355 lb 6.1 oz (161.2 kg) 360 lb 0.2 oz (163.3 kg)    PHYSICAL EXAM Filed Vitals:   04/21/15 1944 04/21/15 2010 04/21/15 2125 04/22/15 0407  BP: 122/47 113/51  120/60  Pulse: 66 64 69 70  Temp: 98.3 F (36.8 C) 97.8 F (36.6 C)  99.5 F (37.5 C)  TempSrc: Oral Axillary  Oral  Resp: 20  20 20   Height:    6' (1.829 m)  Weight:    360 lb 0.2 oz (163.3 kg)   SpO2: 97% 96% 94% 98%   General: Alert, oriented x3, no distress; witnessed hypoventilation/brief apnea before waking him up. Morbid obesity limits examination Head: no evidence of trauma, PERRL, EOMI, no exophtalmos or lid lag, no myxedema, no xanthelasma; normal ears, nose and oropharynx Neck: unable to assess jugular venous pulsations; brisk carotid pulses without delay and no carotid bruits Chest: clear to auscultation, no signs of consolidation by percussion or palpation, normal fremitus, symmetrical and full respiratory excursions Cardiovascular:unable to locate the apical impulse, regular rhythm, normal first and second heart sounds, no rubs or gallops, no murmur Abdomen: morbid obesity/distended, normal bowel sounds Extremities: severe chronic brawny edema of both legs, but soft and showing a few wrinkles; 2+ radial, ulnar and brachial pulses bilaterally;unable to locate lower extremity pulses Neurological: grossly nonfocal  LABS  CBC  Recent Labs  04/20/15 0519 04/21/15 0229  WBC 5.6 5.5  HGB 8.4* 8.2*  HCT 28.8* 27.5*  MCV 97.6 97.2  PLT 83* 73*   Basic Metabolic Panel  Recent Labs  04/20/15 0519 04/21/15 0229  NA 139 139  K 3.4* 3.7  CL  97* 96*  CO2 34* 34*  GLUCOSE 153* 123*  BUN 55* 53*  CREATININE 1.96* 1.98*  CALCIUM 9.3 9.4   Radiology Studies Imaging results have been reviewed and Ct Abdomen Wo Contrast  04/22/2015  CLINICAL DATA:  81 year old male with cirrhosis. EXAM: CT ABDOMEN WITHOUT CONTRAST TECHNIQUE: Multidetector CT imaging of the abdomen was performed following the standard protocol without IV contrast. COMPARISON:  Renal ultrasound dated 03/24/2015 FINDINGS: Evaluation of this exam is limited in the absence of intravenous contrast. Partially visualized small bilateral pleural effusions with associated subsegmental atelectatic changes of the adjacent lung bases. Superimposed pneumonia is not excluded. Clinical correlation is recommended.  Calcified mitral annulus. There is hypoattenuation of the cardiac blood pool compatible with a degree of anemia. Clinical correlation is recommended. No intra-abdominal free air or free fluid. Cirrhosis. The gallbladder is contracted and filled with stones. No pericholecystic fluid identified. Ultrasound may provide better evaluation of the gallbladder. The pancreas appears unremarkable. Splenomegaly measuring up to 22 cm in longest axis. The adrenal glands appear unremarkable. There are moderate bilateral renal atrophy. There is no hydronephrosis or nephrolithiasis on either side. There multiple left renal exophytic hypodense lesions measuring up to 5 cm in the upper pole and 4 cm in the lower pole. These lesions are not characterized on this noncontrast study. Ultrasound is recommended for further characterization. There is colonic diverticulosis. There is no dilatation or inflammatory changes of the visualized bowel. Normal appendix. Atherosclerotic calcification of the abdominal aorta. There is a 3.7 cm infrarenal abdominal aortic aneurysm no portal venous gas identified. Nodular densities anterior to the aorta may represent prominent vasculature or top-normal lymph nodes. There is diastases of anterior abdominal wall musculature in the midline with a broad-based ventral hernia containing loops of small bowel. There is stranding of the subcutaneous soft tissues of the left lateral pelvic wall. No drainable fluid collection/abscess identified. Degenerative changes of the spine. Lower lumbar disc desiccation with vacuum phenomena. No acute fracture. IMPRESSION: Cirrhosis with evidence of portal hypertension and splenomegaly. Cholelithiasis. No hydronephrosis or nephrolithiasis. Multiple left renal hypodense lesions. Ultrasound is recommended for further evaluation. Colonic diverticulosis with no evidence of bowel obstruction or inflammation. Electronically Signed   By: Anner Crete M.D.   On: 04/22/2015 02:29     TELE NSR  ECG NSR, IVCD  ASSESSMENT AND PLAN  1. Acute on chronic diastolic heart failure, predominantly right heart failure. LVEF 45-50% in April. Now 60%, with grade 2 diatsolic dysfunction and elevated filling pressures. Severe pulmonary hypertension. Evidence of marked fluid overload and lymphedema. This is complicated by renal insufficiency, anemia, andparoxysmal atrial fibrillation. Diuresis has slowed down. Renal function has improved after diuresis Cirrhosis/portal HTN is probably contributing to the edema. Watch for signs of hepatic encephalopathy with diuresis and carefully treat electrolyte abnormalities.  2. Severe acute on chronic cor pulmonale/pulmonary HTN , primarily due to OSA and obesity-hypoventilation sd. ([place] sd.). Should wear CPAP during the day if he naps, as well as at night.  3. Acute on chronic renal failure, last creatinine was 2.8 in November, now improved to roughly 2.0. He follows with Dr. Justin Mend.  4. History of paroxysmal atrial fibrillation documented by ECG in November, uncertain duration. Very complex patient.Agree with Dr. Mare Ferrari: "At this point he would not appear to be a good candidate for long-term anticoagulation because of his dementia and multiple comorbidities:" The patient was now maintaining normal sinus rhythm  5. History of nonobstructive CAD based on prior assessment, no obvious angina.  6.  Deconditioning/debility - will need SNF  7. Cirrhosis with evidence of portal hypertension and splenomegaly.Watch for signs of hepatic encephalopathy with diuresis and carefully treat electrolyte abnormalities. Ammonia levels are trending down though, maybe a sign of improved hepatic parenchymal function. Etiology?- obesity related NASH, chronic right heart failure, chronic viral?    Sanda Klein, MD, [facility] [phone] office [phone] pager 04/22/2015 9:32 AM

## 2015-04-23 LAB — GLUCOSE, CAPILLARY
GLUCOSE-CAPILLARY: 139 mg/dL — AB (ref 65–99)
GLUCOSE-CAPILLARY: 174 mg/dL — AB (ref 65–99)
GLUCOSE-CAPILLARY: 175 mg/dL — AB (ref 65–99)
Glucose-Capillary: 120 mg/dL — ABNORMAL HIGH (ref 65–99)
Glucose-Capillary: 174 mg/dL — ABNORMAL HIGH (ref 65–99)

## 2015-04-23 MED ORDER — TORSEMIDE 20 MG PO TABS
100.0000 mg | ORAL_TABLET | Freq: Every day | ORAL | Status: DC
  Administered 2015-04-23 – 2015-04-24 (×2): 100 mg via ORAL
  Filled 2015-04-23 (×2): qty 5

## 2015-04-23 MED ORDER — FERROUS SULFATE 325 (65 FE) MG PO TABS: 325.0000 mg | ORAL_TABLET | Freq: Two times a day (BID) | ORAL | Status: AC

## 2015-04-23 MED ORDER — RIFAXIMIN 550 MG PO TABS: 550.0000 mg | ORAL_TABLET | Freq: Two times a day (BID) | ORAL | Status: AC

## 2015-04-23 MED ORDER — INSULIN ASPART 100 UNIT/ML ~~LOC~~ SOLN: 0.0000 [IU] | Freq: Three times a day (TID) | SUBCUTANEOUS | Status: AC

## 2015-04-23 MED ORDER — LACTULOSE 10 GM/15ML PO SOLN: 20.0000 g | Freq: Two times a day (BID) | ORAL | Status: AC

## 2015-04-23 NOTE — Discharge Summary (Addendum)
Physician Discharge Summary  [name] [id number] DOB: 1931-07-10 DOA: 04/13/2015  PCP: Wende Neighbors, MD  Admit date: 04/13/2015 Discharge date: 04/24/2015 The patient was not discharged on 12/16 as planned and is being discharged on 12/27.   Time spent: 50 minutes  CODE Status: DNR  Recommendations for Outpatient Follow-up:  1. Daily weights-adjust Demadex and Aldactone as needed  2. Weekly metabolic panel until potassium and BUN/creatinine are noted to be stable 3. Adjust Lactulose for him to have 2-3 BMs daily  4. Consider anticoagulation for A-fib if no longer a fall risk  Discharge Condition: stable    Discharge Diagnoses:  Principal Problem:   Acute on chronic diastolic CHF (congestive heart failure) (HCC) Active Problems:   Morbid obesity-BMI 25   Essential hypertension, benign   CAD- mild non obstructive Oct 2006   CKD (chronic kidney disease) stage 3, GFR 30-59 ml/min   Chronic anemia   Thrombocytopenia (HCC)   IDDM (insulin dependent diabetes mellitus) ([place])   Atrial fibrillation (HCC)   OSA treated with BiPAP   Encounter for palliative care   Altered mental state- hepatic encephlopathy   Cirrhosis - new diagnosis  History of present illness:  [name] is a 81 y.o. male a history of morbid obesity, Diastolic CHF, Stage III CKD, paroxysmal Atrial Fibrillation, IDDM, HTN, OSA, Lymphedema, who presents to the ED with complaints of worsening SOB. He was seen by his Cardiologist, Dr. Domenic Polite, and advised to go to the ED for hospitalization due to acute on chronic CHF.  Hospital Course:  Acute on chronic diastolic CHF.  - 2-D echo shows EF of 60%, with grade 2 diastolic and elevated filling pressures. Severe pulmonary hypertensin.  - Improving with diuretics-13 L negative balance -On metolazone and Aldactone at home already-  - Lasix held on 12/24 to prevent over diuresis- still on Aldactone- weight rising therefore resumed demedex (takes this  at home) on 12/26  - Continue beta blocker- patient is not a good candidate for ACE/ARB due CKD 3  Active problems Atrial fibrillation- old diagnosis  - Not a good candidate for Anticoagulant per cardiology, complex patient, would not appear to be a good candidate for long-term anticoagulation because of his dementia and multiple comorbidities (high fall risk)  IDDM.  - With dietary restriction in the hospital, his sugars are well controlled and therefore his home dose of Levemir which is 15 units daily has not been given to him - Hemoglobin A1c 5.9 .  - Continue sliding scale insulin with meals - sugars ranging from 120-160 off of Levemir  CKD stage III- IV -Stable  Anemia- Iron deficiency  -Chronic-iron saturation level low- ferritin level low normal-started iron replacement- - folate and B-12 elevated  Hepatic encephalopathy - Mental status worsening since 12/20 with ammonia level of 80 which has improved with lactulose -Checked CT of the liver for cirrhosis- see below  Cirrhosis - new diagnosis this admission - f/u hepatitis serology- NASH is also a possibility - cont lactulose - added Xifaxin for hepatic encephalopathy - has portal HTN and splenomegaly-   Thrombocytopenia - likely due to cirrhosis  Severe lymphedema -Left leg larger than right leg-chronic  Morbid obesity-BMI 51.  - Patient counseled on diet and weight control   OSA treated with BiPAP. - BIPAP qhs and PRN  Procedures:  2 D ECHO  Consultations:  Cardiology, palliative care  Discharge Exam: Filed Weights   04/21/15 0500 04/22/15 0407 04/23/15 0600  Weight: 161.2 kg (355 lb 6.1 oz)  163.3 kg (360 lb 0.2 oz) 167.2 kg (368 lb 9.8 oz)   Filed Vitals:   04/23/15 0600 04/23/15 0824  BP: 117/51 134/47  Pulse: 63 69  Temp: 98 F (36.7 C) 98.3 F (36.8 C)  Resp: 18 19    General: AAO x 3, no distress Cardiovascular: RRR, no murmurs  Respiratory: clear to auscultation  bilaterally GI: soft, non-tender, non-distended, bowel sound positive  Discharge Instructions You were cared for by a hospitalist during your hospital stay. If you have any questions about your discharge medications or the care you received while you were in the hospital after you are discharged, you can call the unit and asked to speak with the hospitalist on call if the hospitalist that took care of you is not available. Once you are discharged, your primary care physician will handle any further medical issues. Please note that NO REFILLS for any discharge medications will be authorized once you are discharged, as it is imperative that you return to your primary care physician (or establish a relationship with a primary care physician if you do not have one) for your aftercare needs so that they can reassess your need for medications and monitor your lab values.      Discharge Instructions    Discharge instructions    Complete by:  As directed   Low-sodium, heart healthy, carb modified diet     Increase activity slowly    Complete by:  As directed             Medication List    STOP taking these medications        benzoyl peroxide 10 % gel     HYDROcodone-acetaminophen 5-325 MG tablet  Commonly known as:  NORCO/VICODIN     insulin detemir 100 UNIT/ML injection  Commonly known as:  LEVEMIR      TAKE these medications        calcium-vitamin D 500-200 MG-UNIT tablet  Commonly known as:  OSCAL WITH D  Take 1 tablet by mouth 2 (two) times daily.     cetirizine 10 MG tablet  Commonly known as:  ZYRTEC  Take 10 mg by mouth every evening.     cyanocobalamin 1000 MCG tablet  Take 1 tablet (1,000 mcg total) by mouth daily.     D-3-5 5000 UNITS capsule  Generic drug:  Cholecalciferol  Take 5,000 Units by mouth daily.     ferrous sulfate 325 (65 FE) MG tablet  Take 1 tablet (325 mg total) by mouth 2 (two) times daily with a meal.     fluticasone 50 MCG/ACT nasal spray   Commonly known as:  FLONASE  Place 2 sprays into the nose daily.     insulin aspart 100 UNIT/ML injection  Commonly known as:  novoLOG  Inject 0-9 Units into the skin 3 (three) times daily with meals. CBG 70 - 120:0 units CBG 121 - 150:1 unit CBG 151 - 200:2 units CBG 201 - 250:3 units CBG 251 - 300:5 units CBG 301 - 350:7 units CBG 351 - 4009 units     labetalol 200 MG tablet  Commonly known as:  NORMODYNE  Take 1 tablet (200 mg total) by mouth 2 (two) times daily.     lactulose 10 GM/15ML solution  Commonly known as:  CHRONULAC  Take 30 mLs (20 g total) by mouth 2 (two) times daily.     latanoprost 0.005 % ophthalmic solution  Commonly known as:  XALATAN  Place 1 drop into  the right eye at bedtime.     lidocaine 5 %  Commonly known as:  LIDODERM  Place 1 patch onto the skin daily as needed (for pain). Remove & Discard patch within 12 hours or as directed by MD.     metolazone 2.5 MG tablet  Commonly known as:  ZAROXOLYN  TAKE 1 TABLET BY MOUTH TWICE PER WEEK SUNDAYS AND THURSDAYS.     multivitamin tablet  Take 1 tablet by mouth daily.     potassium chloride SA 20 MEQ tablet  Commonly known as:  K-DUR,KLOR-CON  Take 2 tablets (40 mEq total) by mouth 2 (two) times daily.     rifaximin 550 MG Tabs tablet  Commonly known as:  XIFAXAN  Take 1 tablet (550 mg total) by mouth 2 (two) times daily.     sertraline 25 MG tablet  Commonly known as:  ZOLOFT  Take 50 mg by mouth at bedtime.     sodium chloride 0.65 % Soln nasal spray  Commonly known as:  OCEAN  Place 1 spray into both nostrils as needed for congestion.     spironolactone 25 MG tablet  Commonly known as:  ALDACTONE  Take 1 tablet (25 mg total) by mouth daily.     tamsulosin 0.4 MG Caps capsule  Commonly known as:  FLOMAX  Take 0.4 mg by mouth at bedtime.     torsemide 100 MG tablet  Commonly known as:  DEMADEX  Take 100 mg by mouth daily.       Allergies  Allergen Reactions  . Lyrica  [Pregabalin] Swelling    Causes him to have fluid buildup   . Sulfa Antibiotics Other (See Comments)    Night sweats  . Sulfonamide Derivatives Other (See Comments)    Night sweats  . Ambien [Zolpidem Tartrate] Other (See Comments)    Makes him cuss like a sailor  . Penicillins Hives and Rash  . Piroxicam Hives  . Sulfamethoxazole Rash  . Zolpidem Other (See Comments)    Makes him cuss like a sailor      The results of significant diagnostics from this hospitalization (including imaging, microbiology, ancillary and laboratory) are listed below for reference.    Significant Diagnostic Studies: Ct Abdomen Wo Contrast  05/11/15  CLINICAL DATA:  81 year old male with cirrhosis. EXAM: CT ABDOMEN WITHOUT CONTRAST TECHNIQUE: Multidetector CT imaging of the abdomen was performed following the standard protocol without IV contrast. COMPARISON:  Renal ultrasound dated 03/24/2015 FINDINGS: Evaluation of this exam is limited in the absence of intravenous contrast. Partially visualized small bilateral pleural effusions with associated subsegmental atelectatic changes of the adjacent lung bases. Superimposed pneumonia is not excluded. Clinical correlation is recommended. Calcified mitral annulus. There is hypoattenuation of the cardiac blood pool compatible with a degree of anemia. Clinical correlation is recommended. No intra-abdominal free air or free fluid. Cirrhosis. The gallbladder is contracted and filled with stones. No pericholecystic fluid identified. Ultrasound may provide better evaluation of the gallbladder. The pancreas appears unremarkable. Splenomegaly measuring up to 22 cm in longest axis. The adrenal glands appear unremarkable. There are moderate bilateral renal atrophy. There is no hydronephrosis or nephrolithiasis on either side. There multiple left renal exophytic hypodense lesions measuring up to 5 cm in the upper pole and 4 cm in the lower pole. These lesions are not characterized on  this noncontrast study. Ultrasound is recommended for further characterization. There is colonic diverticulosis. There is no dilatation or inflammatory changes of the visualized bowel. Normal appendix. Atherosclerotic  calcification of the abdominal aorta. There is a 3.7 cm infrarenal abdominal aortic aneurysm no portal venous gas identified. Nodular densities anterior to the aorta may represent prominent vasculature or top-normal lymph nodes. There is diastases of anterior abdominal wall musculature in the midline with a broad-based ventral hernia containing loops of small bowel. There is stranding of the subcutaneous soft tissues of the left lateral pelvic wall. No drainable fluid collection/abscess identified. Degenerative changes of the spine. Lower lumbar disc desiccation with vacuum phenomena. No acute fracture. IMPRESSION: Cirrhosis with evidence of portal hypertension and splenomegaly. Cholelithiasis. No hydronephrosis or nephrolithiasis. Multiple left renal hypodense lesions. Ultrasound is recommended for further evaluation. Colonic diverticulosis with no evidence of bowel obstruction or inflammation. Electronically Signed   By: Anner Crete M.D.   On: 04/22/2015 02:29   Ct Head Wo Contrast  04/17/2015  CLINICAL DATA:  Altered mental status.  Encephalopathy. EXAM: CT HEAD WITHOUT CONTRAST TECHNIQUE: Contiguous axial images were obtained from the base of the skull through the vertex without intravenous contrast. COMPARISON:  Head CT March 22, 2015 and brain MRI April 17, 2015 FINDINGS: There is stable mild generalized atrophy with slightly greater atrophy in the left frontal and left temporal lobe regions than elsewhere. There is no intracranial mass, hemorrhage, extra-axial fluid collection, or midline shift. Mild small vessel disease in the centra semiovale bilaterally is stable. There is no new gray-white compartment lesion. No acute infarct evident on this study. The bony calvarium appears  intact. There is chronic appearing mastoid disease bilaterally. There is a chronic defect in the left lamina papyracea with evidence of old maxillary antral region fractures on the left. There is mucosal thickening in the left maxillary antrum. 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 Right orbit appears normal. IMPRESSION: Atrophy with mild small vessel disease, stable. No hemorrhage, mass, or acute appearing infarct. Evidence of old left facial and left globe trauma. Chronic mastoid disease bilaterally. Electronically Signed   By: Lowella Grip III M.D.   On: 04/17/2015 14:59   Mr Brain Wo Contrast  04/17/2015  CLINICAL DATA:  Acute encephalopathy. History of CHF, atrial fibrillation, diabetes, morbid obesity, chronic kidney disease. EXAM: MRI HEAD WITHOUT CONTRAST TECHNIQUE: Multiplanar, multiecho pulse sequences of the brain and surrounding structures were obtained without intravenous contrast. COMPARISON:  CT head March 22, 2015 FINDINGS: Multiple sequences are moderately or severely motion degraded. No reduced diffusion to suggest acute ischemia. Extremely motion degraded SWAN sequence with possible susceptibility artifact in RIGHT parietal sulci. No corroborative abnormality on the remaining sequences. Ventricles and sulci are normal for patient's age. At least patchy supratentorial white matter FLAIR T2 hyperintensities without midline shift or mass effect. Symmetric mildly prominent bifrontal extra-axial spaces favor underlying parenchymal volume loss, less likely chronic subdural hematomas without mass effect. Major intracranial vascular flow voids seen at the skull base. LEFT phthisis bulbi. LEFT maxillary sinus mucosal thickening. Bilateral mastoid effusions. Limited assessment for bone marrow signal abnormality due to motion degraded T1 sequences. IMPRESSION: Motion degraded examination. Equivocal findings for susceptibility artifact in the sulci which  could be artifact or represent subarachnoid hemorrhage, less favored. As patient had difficulty remaining still, recommend follow-up CT head or, repeat MRI of the brain with sedation. Involutional changes. Suspected moderate chronic small vessel ischemic disease. Electronically Signed   By: Elon Alas M.D.   On: 04/17/2015 03:16   US Renal  03/24/2015  CLINICAL DATA:  Acute renal insufficiency EXAM: RENAL / URINARY TRACT ULTRASOUND COMPLETE COMPARISON:  None. FINDINGS: Right Kidney: Length: 14 cm in length. There is cortical thinning probable due to atrophy. No hydronephrosis. No renal calculus. Left Kidney: Not identified due to patient's large body habitus. Bladder: There is a Foley catheter within decompressed urinary bladder. IMPRESSION: No right hydronephrosis. Cortical thinning probable due to atrophy. There left kidney is not identified. Foley catheter within decompressed urinary bladder. Electronically Signed   By: Lahoma Crocker M.D.   On: 03/24/2015 18:34   Dg Chest Port 1 View  04/17/2015  CLINICAL DATA:  Shortness of breath today. EXAM: PORTABLE CHEST 1 VIEW COMPARISON:  Single view of the chest 04/13/2015, 03/23/2015 08/16/2014. FINDINGS: There is cardiomegaly and vascular congestion without frank edema. Left basilar atelectasis is noted. No pneumothorax. Aortic atherosclerosis is seen. IMPRESSION: Cardiomegaly and pulmonary vascular congestion. Electronically Signed   By: Inge Rise M.D.   On: 04/17/2015 08:02   Dg Chest Port 1 View  04/13/2015  CLINICAL DATA:  81 year old male with acute shortness of breath. EXAM: PORTABLE CHEST 1 VIEW COMPARISON:  03/23/2015 and prior chest radiographs FINDINGS: Cardiomegaly and mild pulmonary vascular congestion noted. Bibasilar opacities likely represent atelectasis. There is no evidence of pneumothorax. No acute bony abnormalities are identified. IMPRESSION: Cardiomegaly, mild pulmonary vascular congestion and probable bibasilar atelectasis.  Electronically Signed   By: Margarette Canada M.D.   On: 04/13/2015 17:41    Microbiology: Recent Results (from the past 240 hour(s))  MRSA PCR Screening     Status: None   Collection Time: 04/13/15 10:31 PM  Result Value Ref Range Status   MRSA by PCR NEGATIVE NEGATIVE Final    Comment:        The GeneXpert MRSA Assay (FDA approved for NASAL specimens only), is one component of a comprehensive MRSA colonization surveillance program. It is not intended to diagnose MRSA infection nor to guide or monitor treatment for MRSA infections.   Culture, blood (Routine X 2) w Reflex to ID Panel     Status: None (Preliminary result)   Collection Time: 04/20/15  3:23 PM  Result Value Ref Range Status   Specimen Description BLOOD BLOOD RIGHT HAND  Final   Special Requests BOTTLES DRAWN AEROBIC ONLY 5CC  Final   Culture NO GROWTH 3 DAYS  Final   Report Status PENDING  Incomplete  Culture, blood (Routine X 2) w Reflex to ID Panel     Status: None (Preliminary result)   Collection Time: 04/20/15  3:30 PM  Result Value Ref Range Status   Specimen Description BLOOD BLOOD RIGHT ARM  Final   Special Requests BOTTLES DRAWN AEROBIC AND ANAEROBIC 6CC  Final   Culture NO GROWTH 3 DAYS  Final   Report Status PENDING  Incomplete  Urine culture     Status: None   Collection Time: 04/20/15  6:58 PM  Result Value Ref Range Status   Specimen Description URINE, CATHETERIZED  Final   Special Requests NONE  Final   Culture >=100,000 COLONIES/mL ESCHERICHIA COLI  Final   Report Status 04/22/2015 FINAL  Final   Organism ID, Bacteria ESCHERICHIA COLI  Final      Susceptibility   Escherichia coli - MIC*    AMPICILLIN <=2 SENSITIVE Sensitive     CEFAZOLIN <=4 SENSITIVE Sensitive     CEFTRIAXONE <=1 SENSITIVE Sensitive     CIPROFLOXACIN <=0.25 SENSITIVE Sensitive     GENTAMICIN <=1 SENSITIVE Sensitive     IMIPENEM <=0.25 SENSITIVE Sensitive     NITROFURANTOIN <=16 SENSITIVE Sensitive  TRIMETH/SULFA <=20  SENSITIVE Sensitive     AMPICILLIN/SULBACTAM <=2 SENSITIVE Sensitive     PIP/TAZO <=4 SENSITIVE Sensitive     * >=100,000 COLONIES/mL ESCHERICHIA COLI     Labs: Basic Metabolic Panel:  Recent Labs Lab 04/17/15 0415 04/18/15 0735 04/19/15 0338 04/20/15 0519 04/21/15 0229  NA 140 139 140 139 139  K 3.3* 3.2* 3.3* 3.4* 3.7  CL 101 99* 98* 97* 96*  CO2 30 31 32 34* 34*  GLUCOSE 120* 120* 113* 153* 123*  BUN 60* 60* 56* 55* 53*  CREATININE 2.43* 2.33* 2.15* 1.96* 1.98*  CALCIUM 9.4 9.2 9.4 9.3 9.4   Liver Function Tests:  Recent Labs Lab 04/17/15 0415 04/18/15 0735  AST 25 21  ALT 17 14*  ALKPHOS 67 59  BILITOT 3.1* 1.9*  PROT 5.3* 5.1*  ALBUMIN 2.9* 2.7*   No results for input(s): LIPASE, AMYLASE in the last 168 hours.  Recent Labs Lab 04/16/15 1624 04/17/15 1645 04/18/15 0735 04/20/15 0519 04/21/15 0229  AMMONIA 80* 98* 74* 64* 47*   CBC:  Recent Labs Lab 04/17/15 0415 04/18/15 0735 04/20/15 0519 04/21/15 0229  WBC 4.9 5.0 5.6 5.5  HGB 8.6* 8.3* 8.4* 8.2*  HCT 29.1* 27.6* 28.8* 27.5*  MCV 95.7 96.2 97.6 97.2  PLT 67* 70* 83* 73*   Cardiac Enzymes: No results for input(s): CKTOTAL, CKMB, CKMBINDEX, TROPONINI in the last 168 hours. BNP: BNP (last 3 results)  Recent Labs  08/16/14 1945 04/13/15 1646  BNP 869.7* 798.0*    ProBNP (last 3 results) No results for input(s): PROBNP in the last 8760 hours.  CBG:  Recent Labs Lab 04/22/15 1142 04/22/15 1630 04/22/15 2158 04/23/15 0729 04/23/15 1210  GLUCAP 165* 157* 139* 120* 174*       SignedDebbe Odea, MD [facility] 04/23/2015, 12:35 PM

## 2015-04-23 NOTE — Clinical Social Work Note (Signed)
Patient and family have agreed to have patient go to [facility] in [place] [facility].  CSW contacted [facility] in [place] who said they can take patient tomorrow once he is medically ready for discharge and orders have been received.  Jones Broom. [place], MSW, [place] 04/23/2015 5:53 PM

## 2015-04-23 NOTE — Progress Notes (Signed)
[facility] Progress Note   [name]  [id number]  DOB: [birth year]  DOA: 04/13/2015 PCP: Wende Neighbors, MD  Brief narrative: [name] is a 81 y.o. [name] a history of morbid obesity, Diastolic CHF, Stage III CKD, Atrial Fibrillation, IDDM, HTN. OSA, Lymphedema,  who presents to the ED with complaints of worsening SOB. He was seen by his Cardiologist, Dr. Domenic Polite, and advised to go to the ED for hospitalization due to acute on chronic CHF.  Subjective: Having back pain and asking to be repositioned in bed. No new complaints. Asking a number of questions about his cirrhosis which I have answered.   Assessment/Plan: Acute on chronic diastolic CHF.  - 2-D echo shows EF of 60%, with grade 2 diastolic and elevated filling pressures. Severe pulmonary hypertensin.  - Improving with diuretics-13 L negative balance -On metolazone and Aldactone at home already-  - Lasix held on 12/24 to prevent over diuresis- still on Aldactone- weight rising therefore will resume demedex which he takes at home today.  - Continue beta blocker- patient is not a good candidate for ACE/ARB due CKD 3  Active problems Atrial fibrillation- old diagnosis  - Not a good candidate for Anticoagulant Rx. Per cardiology, complex patient, would not appear to be a good candidate for long-term anticoagulation because of his dementia and multiple comorbidities (high fall risk)  IDDM.  - Holding Levemir due to borderline glucose. Hemoglobin A1c 5.9 .  - Continue sliding scale insulin - sugars ranging from 120-160 off of Levemir  OSA treated with BiPAP. - BIPAP qhs and PRN  Morbid obesity-BMI 51.  - Patient counseled on diet and weight control   CKD stage III- IV -Stable  Anemia- Iron deficiency  -Chronic-iron saturation level low- ferritin level low normal-started iron replacement- - folate and B-12 elevated  Hepatic encephalopathy - Mental status worsening since 12/20 with ammonia  level of 80 which has improved with lactulose -Checked CT of the liver for cirrhosis- see below  Cirrhosis - new diagnosis this admission - f/u hepatitis serology- NASH is also a possibility - cont lactulose - added Xifaxin for hepatic encephalopathy - has portal HTN and splenomegaly-    Thrombocytopenia - likely due to cirrhosis  Severe lymphedema -Left leg larger than right leg-chronic     Code Status:     Code Status Orders        Start     Ordered   04/17/15 1633  Do not attempt resuscitation (DNR)   Continuous    Question Answer Comment  In the event of cardiac or respiratory ARREST Do not call a "code blue"   In the event of cardiac or respiratory ARREST Do not perform Intubation, CPR, defibrillation or ACLS   In the event of cardiac or respiratory ARREST Use medication by any route, position, wound care, and other measures to relive pain and suffering. May use oxygen, suction and manual treatment of airway obstruction as needed for comfort.      04/17/15 1632     Family Communication: called wife- left message Disposition Plan: SNF DVT prophylaxis: SCDs Consultants: Cardiolgy, Palliative care Procedures: 2D echo  Antibiotics: Anti-infectives    Start     Dose/Rate Route Frequency Ordered Stop   04/22/15 1030  rifaximin (XIFAXAN) tablet 550 mg     550 mg Oral 2 times daily 04/22/15 0942        Objective: Filed Weights   04/21/15 0500 04/22/15 0407 04/23/15 0600  Weight: 161.2 kg (355 lb 6.1  oz) 163.3 kg (360 lb 0.2 oz) 167.2 kg (368 lb 9.8 oz)    Intake/Output Summary (Last 24 hours) at 04/23/15 1225 Last data filed at 04/22/15 1239  Gross per 24 hour  Intake      0 ml  Output    700 ml  Net   -700 ml     Vitals Filed Vitals:   04/22/15 2130 04/23/15 0600 04/23/15 0824 04/23/15 1141  BP: 119/44 117/51 134/47   Pulse: 61 63 69   Temp: 97.3 F (36.3 C) 98 F (36.7 C) 98.3 F (36.8 C)   TempSrc: Oral Oral Oral   Resp: 18 18 19    Height:       Weight:  167.2 kg (368 lb 9.8 oz)    SpO2: 98% 94% 94% 68%    Exam:  General:  Pt is alert, not in acute distress morbidly obese  HEENT: No icterus, No thrush, oral mucosa moist  Cardiovascular: regular rate and rhythm, S1/S2 No murmur  Respiratory: clear to auscultation bilaterally   Abdomen: Soft, +Bowel sounds, non tender, non distended, no guarding  MSK: cyanosis or clubbing bilateral venous stasis changes/lymphedema -left leg larger than right leg  Data Reviewed: Basic Metabolic Panel:  Recent Labs Lab 04/17/15 0415 04/18/15 0735 12/[name]/16 0338 04/20/15 0519 04/21/15 0229  NA 140 139 140 139 139  K 3.3* 3.2* 3.3* 3.4* 3.7  CL 101 99* 98* 97* 96*  CO2 30 31 32 34* 34*  GLUCOSE 120* 120* 113* 153* 123*  BUN 60* 60* 56* 55* 53*  CREATININE 2.43* 2.33* 2.15* 1.96* 1.98*  CALCIUM 9.4 9.2 9.4 9.3 9.4   Liver Function Tests:  Recent Labs Lab 04/17/15 0415 04/18/15 0735  AST 25 21  ALT 17 14*  ALKPHOS 67 59  BILITOT 3.1* 1.9*  PROT 5.3* 5.1*  ALBUMIN 2.9* 2.7*   No results for input(s): LIPASE, AMYLASE in the last 168 hours.  Recent Labs Lab 04/16/15 1624 04/17/15 1645 04/18/15 0735 04/20/15 0519 04/21/15 0229  AMMONIA 80* 98* 74* 64* 47*   CBC:  Recent Labs Lab 04/17/15 0415 04/18/15 0735 04/20/15 0519 04/21/15 0229  WBC 4.9 5.0 5.6 5.5  HGB 8.6* 8.3* 8.4* 8.2*  HCT 29.1* 27.6* 28.8* 27.5*  MCV 95.7 96.2 97.6 97.2  PLT 67* 70* 83* 73*   Cardiac Enzymes: No results for input(s): CKTOTAL, CKMB, CKMBINDEX, TROPONINI in the last 168 hours. BNP (last 3 results)  Recent Labs  08/16/14 1945 04/13/15 1646  BNP 869.7* 798.0*    ProBNP (last 3 results) No results for input(s): PROBNP in the last 8760 hours.  CBG:  Recent Labs Lab 04/22/15 1142 04/22/15 1630 04/22/15 2158 04/23/15 0729 04/23/15 1210  GLUCAP 165* 157* 139* 120* 174*    Recent Results (from the past 240 hour(s))  MRSA PCR Screening     Status: None   Collection  Time: 04/13/15 10:31 PM  Result Value Ref Range Status   MRSA by PCR NEGATIVE NEGATIVE Final    Comment:        The GeneXpert MRSA Assay (FDA approved for NASAL specimens only), is one component of a comprehensive MRSA colonization surveillance program. It is not intended to diagnose MRSA infection nor to guide or monitor treatment for MRSA infections.   Culture, blood (Routine X 2) w Reflex to ID Panel     Status: None (Preliminary result)   Collection Time: 04/20/15  3:23 PM  Result Value Ref Range Status   Specimen Description BLOOD BLOOD RIGHT  HAND  Final   Special Requests BOTTLES DRAWN AEROBIC ONLY 5CC  Final   Culture NO GROWTH 3 DAYS  Final   Report Status PENDING  Incomplete  Culture, blood (Routine X 2) w Reflex to ID Panel     Status: None (Preliminary result)   Collection Time: 04/20/15  3:30 PM  Result Value Ref Range Status   Specimen Description BLOOD BLOOD RIGHT ARM  Final   Special Requests BOTTLES DRAWN AEROBIC AND ANAEROBIC 6CC  Final   Culture NO GROWTH 3 DAYS  Final   Report Status PENDING  Incomplete  Urine culture     Status: None   Collection Time: 04/20/15  6:58 PM  Result Value Ref Range Status   Specimen Description URINE, CATHETERIZED  Final   Special Requests NONE  Final   Culture >=100,000 COLONIES/mL ESCHERICHIA COLI  Final   Report Status 04/22/2015 FINAL  Final   Organism ID, Bacteria ESCHERICHIA COLI  Final      Susceptibility   Escherichia coli - MIC*    AMPICILLIN <=2 SENSITIVE Sensitive     CEFAZOLIN <=4 SENSITIVE Sensitive     CEFTRIAXONE <=1 SENSITIVE Sensitive     CIPROFLOXACIN <=0.25 SENSITIVE Sensitive     GENTAMICIN <=1 SENSITIVE Sensitive     IMIPENEM <=0.25 SENSITIVE Sensitive     NITROFURANTOIN <=16 SENSITIVE Sensitive     TRIMETH/SULFA <=20 SENSITIVE Sensitive     AMPICILLIN/SULBACTAM <=2 SENSITIVE Sensitive     PIP/TAZO <=4 SENSITIVE Sensitive     * >=100,000 COLONIES/mL ESCHERICHIA COLI     Studies: Ct Abdomen Wo  Contrast  04/22/2015  CLINICAL DATA:  81 year old [name] with cirrhosis. EXAM: CT ABDOMEN WITHOUT CONTRAST TECHNIQUE: Multidetector CT imaging of the abdomen was performed following the standard protocol without IV contrast. COMPARISON:  Renal ultrasound dated 03/24/2015 FINDINGS: Evaluation of this exam is limited in the absence of intravenous contrast. Partially visualized small bilateral pleural effusions with associated subsegmental atelectatic changes of the adjacent lung bases. Superimposed pneumonia is not excluded. Clinical correlation is recommended. Calcified mitral annulus. There is hypoattenuation of the cardiac blood pool compatible with a degree of anemia. Clinical correlation is recommended. No intra-abdominal free air or free fluid. Cirrhosis. The gallbladder is contracted and filled with stones. No pericholecystic fluid identified. Ultrasound may provide better evaluation of the gallbladder. The pancreas appears unremarkable. Splenomegaly measuring up to [name] cm in longest axis. The adrenal glands appear unremarkable. There are moderate bilateral renal atrophy. There is no hydronephrosis or nephrolithiasis on either side. There multiple left renal exophytic hypodense lesions measuring up to 5 cm in the upper pole and 4 cm in the lower pole. These lesions are not characterized on this noncontrast study. Ultrasound is recommended for further characterization. There is colonic diverticulosis. There is no dilatation or inflammatory changes of the visualized bowel. Normal appendix. Atherosclerotic calcification of the abdominal aorta. There is a 3.7 cm infrarenal abdominal aortic aneurysm no portal venous gas identified. Nodular densities anterior to the aorta may represent prominent vasculature or top-normal lymph nodes. There is diastases of anterior abdominal wall musculature in the midline with a broad-based ventral hernia containing loops of small bowel. There is stranding of the subcutaneous soft  tissues of the left lateral pelvic wall. No drainable fluid collection/abscess identified. Degenerative changes of the spine. Lower lumbar disc desiccation with vacuum phenomena. No acute fracture. IMPRESSION: Cirrhosis with evidence of portal hypertension and splenomegaly. Cholelithiasis. No hydronephrosis or nephrolithiasis. Multiple left renal hypodense lesions. Ultrasound is recommended for  further evaluation. Colonic diverticulosis with no evidence of bowel obstruction or inflammation. Electronically Signed   By: Anner Crete M.D.   On: 04/22/2015 02:29    Scheduled Meds:  Scheduled Meds: . antiseptic oral rinse  7 mL Mouth Rinse q12n4p  . calcium-vitamin D  1 tablet Oral BID  . chlorhexidine  15 mL Mouth Rinse BID  . ferrous sulfate  325 mg Oral BID WC  . insulin aspart  0-5 Units Subcutaneous QHS  . insulin aspart  0-9 Units Subcutaneous TID WC  . labetalol  200 mg Oral BID  . lactulose  20 g Oral BID  . latanoprost  1 drop Right Eye QHS  . loratadine  10 mg Oral Daily  . multivitamin with minerals  1 tablet Oral Daily  . potassium chloride  20 mEq Oral Daily  . rifaximin  550 mg Oral BID  . sertraline  25 mg Oral QHS  . sodium chloride  3 mL Intravenous Q12H  . spironolactone  25 mg Oral Daily  . tamsulosin  0.4 mg Oral QHS  . vitamin B-12  1,000 mcg Oral Daily   Continuous Infusions:   Time spent on care of this patient: 35 min   Cuartelez, MD 04/23/2015, 12:25 PM  LOS: 10 days   [facility] Office  [phone] Pager - Text Page per [email or web address] If 7PM-7AM, please contact night-coverage [email or web address]

## 2015-04-23 NOTE — Clinical Social Work Note (Signed)
CSW contacted patient's wife and gave bed offers.  Patient's wife stated she wants her son [name] to make the decision on where patient can go.  Awaiting response from patient's son and wife.  Jones Broom. [place], MSW, [place] 04/23/2015 1:26 PM

## 2015-04-23 NOTE — Progress Notes (Signed)
PT Cancellation Note  Patient Details Name: DARTANYAN MANOLIS MRN: [id number] DOB: 03-24-1932   Cancelled Treatment:    Reason Eval/Treat Not Completed: Other (comment) (Pt was asleep and too lethargic to awaken.  Wearing cpap) and cannot awaken   Ramond Dial 04/23/2015, 2:45 PM   Mee Hives, PT MS Acute Rehab Dept. Number: [facility] [id number] and [place] [phone]

## 2015-04-24 DIAGNOSIS — D696 Thrombocytopenia, unspecified: Secondary | ICD-10-CM | POA: Diagnosis not present

## 2015-04-24 DIAGNOSIS — I482 Chronic atrial fibrillation: Secondary | ICD-10-CM | POA: Diagnosis not present

## 2015-04-24 DIAGNOSIS — R279 Unspecified lack of coordination: Secondary | ICD-10-CM | POA: Diagnosis not present

## 2015-04-24 DIAGNOSIS — I5022 Chronic systolic (congestive) heart failure: Secondary | ICD-10-CM | POA: Diagnosis not present

## 2015-04-24 DIAGNOSIS — N390 Urinary tract infection, site not specified: Secondary | ICD-10-CM | POA: Diagnosis not present

## 2015-04-24 DIAGNOSIS — R41841 Cognitive communication deficit: Secondary | ICD-10-CM | POA: Diagnosis not present

## 2015-04-24 DIAGNOSIS — R531 Weakness: Secondary | ICD-10-CM | POA: Diagnosis not present

## 2015-04-24 DIAGNOSIS — E669 Obesity, unspecified: Secondary | ICD-10-CM | POA: Diagnosis not present

## 2015-04-24 DIAGNOSIS — I509 Heart failure, unspecified: Secondary | ICD-10-CM | POA: Diagnosis not present

## 2015-04-24 DIAGNOSIS — N39498 Other specified urinary incontinence: Secondary | ICD-10-CM | POA: Diagnosis not present

## 2015-04-24 DIAGNOSIS — D649 Anemia, unspecified: Secondary | ICD-10-CM | POA: Diagnosis not present

## 2015-04-24 DIAGNOSIS — E1321 Other specified diabetes mellitus with diabetic nephropathy: Secondary | ICD-10-CM | POA: Diagnosis not present

## 2015-04-24 DIAGNOSIS — R4182 Altered mental status, unspecified: Secondary | ICD-10-CM | POA: Diagnosis not present

## 2015-04-24 DIAGNOSIS — I10 Essential (primary) hypertension: Secondary | ICD-10-CM | POA: Diagnosis not present

## 2015-04-24 DIAGNOSIS — J811 Chronic pulmonary edema: Secondary | ICD-10-CM | POA: Diagnosis not present

## 2015-04-24 DIAGNOSIS — F330 Major depressive disorder, recurrent, mild: Secondary | ICD-10-CM | POA: Diagnosis not present

## 2015-04-24 DIAGNOSIS — E668 Other obesity: Secondary | ICD-10-CM | POA: Diagnosis not present

## 2015-04-24 DIAGNOSIS — K7469 Other cirrhosis of liver: Secondary | ICD-10-CM | POA: Diagnosis not present

## 2015-04-24 DIAGNOSIS — G8929 Other chronic pain: Secondary | ICD-10-CM | POA: Diagnosis not present

## 2015-04-24 DIAGNOSIS — R278 Other lack of coordination: Secondary | ICD-10-CM | POA: Diagnosis not present

## 2015-04-24 DIAGNOSIS — J810 Acute pulmonary edema: Secondary | ICD-10-CM | POA: Diagnosis not present

## 2015-04-24 DIAGNOSIS — R1312 Dysphagia, oropharyngeal phase: Secondary | ICD-10-CM | POA: Diagnosis not present

## 2015-04-24 DIAGNOSIS — I5033 Acute on chronic diastolic (congestive) heart failure: Secondary | ICD-10-CM | POA: Diagnosis not present

## 2015-04-24 DIAGNOSIS — J3089 Other allergic rhinitis: Secondary | ICD-10-CM | POA: Diagnosis not present

## 2015-04-24 DIAGNOSIS — J301 Allergic rhinitis due to pollen: Secondary | ICD-10-CM | POA: Diagnosis not present

## 2015-04-24 DIAGNOSIS — D631 Anemia in chronic kidney disease: Secondary | ICD-10-CM | POA: Diagnosis not present

## 2015-04-24 DIAGNOSIS — J302 Other seasonal allergic rhinitis: Secondary | ICD-10-CM | POA: Diagnosis not present

## 2015-04-24 DIAGNOSIS — M6281 Muscle weakness (generalized): Secondary | ICD-10-CM | POA: Diagnosis not present

## 2015-04-24 LAB — HEPATITIS PANEL, ACUTE
HCV Ab: <0.1 s/co ratio (ref 0.0–0.9)
HEP B C IGM: NEGATIVE
HEP B S AG: NEGATIVE
Hep A IgM: NEGATIVE

## 2015-04-24 LAB — GLUCOSE, CAPILLARY
GLUCOSE-CAPILLARY: 112 mg/dL — AB (ref 65–99)
Glucose-Capillary: 117 mg/dL — ABNORMAL HIGH (ref 65–99)

## 2015-04-24 MED ORDER — FLUTICASONE PROPIONATE 50 MCG/ACT NA SUSP
2.0000 | Freq: Every day | NASAL | Status: DC
  Administered 2015-04-24: 2 via NASAL
  Filled 2015-04-24: qty 16

## 2015-04-24 NOTE — Care Management Important Message (Signed)
Important Message  Patient Details  Name: [name] MRN: [id number] Date of Birth: [birth year]   Medicare Important Message Given:  Yes    [name], [name] 04/24/2015, 1:00 PM

## 2015-04-24 NOTE — Progress Notes (Signed)
Pt transported to [facility] in [place]. Family at pt bedside. RN called Report to receiving RN. IV removed Saksham Akkerman V, RN

## 2015-04-24 NOTE — Progress Notes (Signed)
Patient Name: [name] Date of Encounter: 04/24/2015     Principal Problem:   Acute on chronic diastolic CHF (congestive heart failure) ([place]) Active Problems:   Morbid obesity-BMI 51   Essential hypertension, benign   CAD- mild non obstructive Oct 2006   CKD (chronic kidney disease) stage 3, GFR 30-59 ml/min   Chronic anemia   Thrombocytopenia (H[facility])   IDDM (insulin dependent diabetes mellitus) ([place])   Atrial fibrillation (H[facility])   OSA treated with BiPAP   Encounter for palliative care   Altered mental state   Palliative care encounter   Cirrhosis ([place])    SUBJECTIVE  The patient denies any chest pain or shortness of breath.  Rhythm remains stable normal sinus rhythm.  Anticipate transfer to [facility] skilled nursing facility today.  CURRENT MEDS . antiseptic oral rinse  7 mL Mouth Rinse q12n4p  . calcium-vitamin D  1 tablet Oral BID  . chlorhexidine  15 mL Mouth Rinse BID  . ferrous sulfate  325 mg Oral BID WC  . fluticasone  2 spray Each Nare Daily  . insulin aspart  0-5 Units Subcutaneous QHS  . insulin aspart  0-9 Units Subcutaneous TID WC  . labetalol  200 mg Oral BID  . lactulose  20 g Oral BID  . latanoprost  1 drop Right Eye QHS  . loratadine  10 mg Oral Daily  . multivitamin with minerals  1 tablet Oral Daily  . potassium chloride  20 mEq Oral Daily  . rifaximin  550 mg Oral BID  . sertraline  25 mg Oral QHS  . sodium chloride  3 mL Intravenous Q12H  . spironolactone  25 mg Oral Daily  . tamsulosin  0.4 mg Oral QHS  . torsemide  100 mg Oral Daily  . vitamin B-12  1,000 mcg Oral Daily    OBJECTIVE  Filed Vitals:   04/23/15 1416 04/23/15 2158 04/24/15 0448 04/24/15 0727  BP: 144/50 119/49 133/50 117/49  Pulse: 66 64 69 70  Temp: 98.4 F (36.9 C) 98.5 F (36.9 C)  98.3 F (36.8 C)  TempSrc: Oral Oral  Oral  Resp: 18 20 20 18   Height:      Weight:   362 lb 10.5 oz (164.5 kg)   SpO2: 93% 93% 92% 95%    Intake/Output Summary (Last 24  hours) at 04/24/15 0930 Last data filed at 04/24/15 0829  Gross per 24 hour  Intake   1563 ml  Output   1453 ml  Net    110 ml   Filed Weights   04/22/15 0407 04/23/15 0600 04/24/15 0448  Weight: 360 lb 0.2 oz (163.3 kg) 368 lb 9.8 oz (167.2 kg) 362 lb 10.5 oz (164.5 kg)    PHYSICAL EXAM  General: Pleasant, NAD. Neuro: Alert and oriented X 3. Moves all extremities spontaneously. Psych: Normal affect. HEENT: Normal. The patient is blind in his left eye Neck: Supple without bruits or JVD. Lungs: Resp regular and unlabored, CTA. Heart: RRR no s3, s4,  2/6 systolic ejection murmur at base Abdomen: Soft, non-tender, non-distended, BS + x 4.  Extremities: Improving bilateral edema. Lymphedema worse on left.  Accessory Clinical Findings  CBC No results for input(s): WBC, NEUTROABS, HGB, HCT, MCV, PLT in the last 72 hours. Basic Metabolic Panel No results for input(s): NA, K, CL, CO2, GLUCOSE, BUN, CREATININE, CALCIUM, MG, PHOS in the last 72 hours. Liver Function Tests No results for input(s): AST, ALT, ALKPHOS, BILITOT, PROT, ALBUMIN in the  last 72 hours. No results for input(s): LIPASE, AMYLASE in the last 72 hours. Cardiac Enzymes No results for input(s): CKTOTAL, CKMB, CKMBINDEX, TROPONINI in the last 72 hours. BNP Invalid input(s): POCBNP D-Dimer No results for input(s): DDIMER in the last 72 hours. Hemoglobin A1C No results for input(s): HGBA1C in the last 72 hours. Fasting Lipid Panel No results for input(s): CHOL, HDL, LDLCALC, TRIG, CHOLHDL, LDLDIRECT in the last 72 hours. Thyroid Function Tests No results for input(s): TSH, T4TOTAL, T3FREE, THYROIDAB in the last 72 hours.  Invalid input(s): FREET3  TELE  Normal sinus rhythm  ECG    Radiology/Studies  Ct Abdomen Wo Contrast  2015/04/29  CLINICAL DATA:  81 year old male with cirrhosis. EXAM: CT ABDOMEN WITHOUT CONTRAST TECHNIQUE: Multidetector CT imaging of the abdomen was performed following the  standard protocol without IV contrast. COMPARISON:  Renal ultrasound dated 03/24/2015 FINDINGS: Evaluation of this exam is limited in the absence of intravenous contrast. Partially visualized small bilateral pleural effusions with associated subsegmental atelectatic changes of the adjacent lung bases. Superimposed pneumonia is not excluded. Clinical correlation is recommended. Calcified mitral annulus. There is hypoattenuation of the cardiac blood pool compatible with a degree of anemia. Clinical correlation is recommended. No intra-abdominal free air or free fluid. Cirrhosis. The gallbladder is contracted and filled with stones. No pericholecystic fluid identified. Ultrasound may provide better evaluation of the gallbladder. The pancreas appears unremarkable. Splenomegaly measuring up to 22 cm in longest axis. The adrenal glands appear unremarkable. There are moderate bilateral renal atrophy. There is no hydronephrosis or nephrolithiasis on either side. There multiple left renal exophytic hypodense lesions measuring up to 5 cm in the upper pole and 4 cm in the lower pole. These lesions are not characterized on this noncontrast study. Ultrasound is recommended for further characterization. There is colonic diverticulosis. There is no dilatation or inflammatory changes of the visualized bowel. Normal appendix. Atherosclerotic calcification of the abdominal aorta. There is a 3.7 cm infrarenal abdominal aortic aneurysm no portal venous gas identified. Nodular densities anterior to the aorta may represent prominent vasculature or top-normal lymph nodes. There is diastases of anterior abdominal wall musculature in the midline with a broad-based ventral hernia containing loops of small bowel. There is stranding of the subcutaneous soft tissues of the left lateral pelvic wall. No drainable fluid collection/abscess identified. Degenerative changes of the spine. Lower lumbar disc desiccation with vacuum phenomena. No acute  fracture. IMPRESSION: Cirrhosis with evidence of portal hypertension and splenomegaly. Cholelithiasis. No hydronephrosis or nephrolithiasis. Multiple left renal hypodense lesions. Ultrasound is recommended for further evaluation. Colonic diverticulosis with no evidence of bowel obstruction or inflammation. Electronically Signed   By: Anner Crete M.D.   On: 04/29/2015 02:29   Ct Head Wo Contrast  04/17/2015  CLINICAL DATA:  Altered mental status.  Encephalopathy. EXAM: CT HEAD WITHOUT CONTRAST TECHNIQUE: Contiguous axial images were obtained from the base of the skull through the vertex without intravenous contrast. COMPARISON:  Head CT March 22, 2015 and brain MRI April 17, 2015 FINDINGS: There is stable mild generalized atrophy with slightly greater atrophy in the left frontal and left temporal lobe regions than elsewhere. There is no intracranial mass, hemorrhage, extra-axial fluid collection, or midline shift. Mild small vessel disease in the centra semiovale bilaterally is stable. There is no new gray-white compartment lesion. No acute infarct evident on this study. The bony calvarium appears intact. There is chronic appearing mastoid disease bilaterally. There is a chronic defect in the left lamina papyracea with evidence of  old maxillary antral region fractures on the left. There is mucosal thickening in the left maxillary antrum. 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 Right orbit appears normal. IMPRESSION: Atrophy with mild small vessel disease, stable. No hemorrhage, mass, or acute appearing infarct. Evidence of old left facial and left globe trauma. Chronic mastoid disease bilaterally. Electronically Signed   By: Lowella Grip III M.D.   On: 04/17/2015 14:59   Mr Brain Wo Contrast  04/17/2015  CLINICAL DATA:  Acute encephalopathy. History of CHF, atrial fibrillation, diabetes, morbid obesity, chronic kidney disease. EXAM: MRI HEAD WITHOUT  CONTRAST TECHNIQUE: Multiplanar, multiecho pulse sequences of the brain and surrounding structures were obtained without intravenous contrast. COMPARISON:  CT head March 22, 2015 FINDINGS: Multiple sequences are moderately or severely motion degraded. No reduced diffusion to suggest acute ischemia. Extremely motion degraded SWAN sequence with possible susceptibility artifact in RIGHT parietal sulci. No corroborative abnormality on the remaining sequences. Ventricles and sulci are normal for patient's age. At least patchy supratentorial white matter FLAIR T2 hyperintensities without midline shift or mass effect. Symmetric mildly prominent bifrontal extra-axial spaces favor underlying parenchymal volume loss, less likely chronic subdural hematomas without mass effect. Major intracranial vascular flow voids seen at the skull base. LEFT phthisis bulbi. LEFT maxillary sinus mucosal thickening. Bilateral mastoid effusions. Limited assessment for bone marrow signal abnormality due to motion degraded T1 sequences. IMPRESSION: Motion degraded examination. Equivocal findings for susceptibility artifact in the sulci which could be artifact or represent subarachnoid hemorrhage, less favored. As patient had difficulty remaining still, recommend follow-up CT head or, repeat MRI of the brain with sedation. Involutional changes. Suspected moderate chronic small vessel ischemic disease. Electronically Signed   By: Elon Alas M.D.   On: 04/17/2015 03:16   Dg Chest Port 1 View  04/17/2015  CLINICAL DATA:  Shortness of breath today. EXAM: PORTABLE CHEST 1 VIEW COMPARISON:  Single view of the chest 04/13/2015, 03/23/2015 08/16/2014. FINDINGS: There is cardiomegaly and vascular congestion without frank edema. Left basilar atelectasis is noted. No pneumothorax. Aortic atherosclerosis is seen. IMPRESSION: Cardiomegaly and pulmonary vascular congestion. Electronically Signed   By: Inge Rise M.D.   On: 04/17/2015 08:02     Dg Chest Port 1 View  04/13/2015  CLINICAL DATA:  81 year old male with acute shortness of breath. EXAM: PORTABLE CHEST 1 VIEW COMPARISON:  03/23/2015 and prior chest radiographs FINDINGS: Cardiomegaly and mild pulmonary vascular congestion noted. Bibasilar opacities likely represent atelectasis. There is no evidence of pneumothorax. No acute bony abnormalities are identified. IMPRESSION: Cardiomegaly, mild pulmonary vascular congestion and probable bibasilar atelectasis. Electronically Signed   By: Margarette Canada M.D.   On: 04/13/2015 17:41    ASSESSMENT AND PLAN  1. Evidence of marked fluid overload and lymphedema with history of diastolic heart failure improved. This is complicated by renal insufficiency, anemia, and atrial fibrillation.   2. Acute on chronic diastolic heart failure, last LVEF 45-50% in April. Now 60%, with grade 2 diatsolic dysfunction and elevated filling pressures. Severe pulmonary hypertensin.   3. Acute on chronic renal failure, last outpatient creatinine was 2.8 in November. He follows with Dr. Justin Mend.  4. Atrial fibrillation documented by ECG in November, no prior history of same. He is not aware of any palpitations and I am not certain about the duration. Very complex patient. At this point he would not appear to be a good candidate for long-term anticoagulation because of his dementia and multiple comorbidities. The patient is now maintaining normal  sinus rhythm.    5. History of nonobstructive CAD based on prior assessment, no obvious angina.  6. Obstructive sleep apnea, on CPAP but not clear about consistency.  Plan: Continue current medication.  Anticipate transfer to skilled nursing facility today.  Signed, Warren Danes MD

## 2015-04-24 NOTE — Consult Note (Signed)
   [facility] CM Inpatient Consult   04/24/2015  Aro Baranowski [facility] 07-15-1931 [id number]   [facility] Care Management follow up. Confirmed with inpatient RNCM that patient discharged to [facility] in [place] today 04/24/15. Will make [place] aware.   Marthenia Rolling, MSN-Ed, RN,BSN [facility] Liaison [phone]

## 2015-04-24 NOTE — Clinical Social Work Note (Signed)
Clinical Social Worker facilitated patient discharge including contacting patient family and facility to confirm patient discharge plans.  Clinical information faxed to facility and family agreeable with plan.  CSW arranged ambulance transport via [facility] to [place].  RN to call report prior to discharge.  Clinical Social Worker will sign off for now as social work intervention is no longer needed. Please consult [place] again if new need arises.  Barbette Or, [place]

## 2015-04-24 NOTE — Clinical Social Work Placement (Signed)
   CLINICAL SOCIAL WORK PLACEMENT  NOTE  Date:  04/24/2015  Patient Details  Name: [name] MRN: [id number] Date of Birth: [birth year]  Clinical Social Work is seeking post-discharge placement for this patient at the [place] level of care (*CSW will initial, date and re-position this form in  chart as items are completed):  Yes   Patient/family provided with [place] Work Department's list of facilities offering this level of care within the geographic area requested by the patient (or if unable, by the patient's family).  No   Patient/family informed of their freedom to choose among providers that offer the needed level of care, that participate in Medicare, Medicaid or managed care program needed by the patient, have an available bed and are willing to accept the patient.  Yes   Patient/family informed of [place]'s ownership interest in [facility] and [facility], as well as of the fact that they are under no obligation to receive care at these facilities.  PASRR submitted to EDS on       PASRR number received on       Existing PASRR number confirmed on 04/16/15     FL2 transmitted to all facilities in geographic area requested by pt/family on 04/16/15     FL2 transmitted to all facilities within larger geographic area on       Patient informed that his/her managed care company has contracts with or will negotiate with certain facilities, including the following:        Yes   Patient/family informed of bed offers received.  Patient chooses bed at [facility]     Physician recommends and patient chooses bed at      Patient to be transferred to [facility] on 04/24/15.  Patient to be transferred to facility by Ambulance     Patient family notified on 04/24/15 of transfer.  Name of family member notified:  Colon Delaossa (patient wife over the phone)     PHYSICIAN Please prepare priority discharge summary,  including medications, Please prepare prescriptions, Please sign FL2     Additional Comment:    Barbette Or, [place]

## 2015-04-25 DIAGNOSIS — I5022 Chronic systolic (congestive) heart failure: Secondary | ICD-10-CM | POA: Diagnosis not present

## 2015-04-25 DIAGNOSIS — I10 Essential (primary) hypertension: Secondary | ICD-10-CM | POA: Diagnosis not present

## 2015-04-25 DIAGNOSIS — I482 Chronic atrial fibrillation: Secondary | ICD-10-CM | POA: Diagnosis not present

## 2015-04-25 DIAGNOSIS — K7469 Other cirrhosis of liver: Secondary | ICD-10-CM | POA: Diagnosis not present

## 2015-04-25 LAB — CULTURE, BLOOD (ROUTINE X 2)
Culture: NO GROWTH
Culture: NO GROWTH

## 2015-04-26 ENCOUNTER — Other Ambulatory Visit: Payer: Self-pay | Admitting: *Deleted

## 2015-04-26 NOTE — Patient Outreach (Addendum)
[facility]) Care Management  04/26/2015  [name] [birth year] [id number]   I spoke with [name], [place] at [facility] [phone]) today who confirms that [name] is still there having been readmitted on 04/24/15 after another hospitalization for acute on chronic CHF.   I asked [name] if she would please contact me regarding DustinMccuen's progress and particularly if she would notify me of his pending discharge to home. I have had great difficulty in getting [name] to adhere to our appointment scheduled over the last 3-4 months but he told the hospital liaison, prior to discharge, that he was still interested in our services.   I called his wife today at home [phone]) and discussed [name] current status/progress. She tells me he has a "bowel infection" and is still to weak to stand/walk on his own. She says [name] told her yesterday that he wanted to come home this Saturday and she told him he would find the door locked because she is unable to care for him in his current condition and would not subject him or her to not having appropriate care.   [name] relates that when [name] returned home, leaving [facility] after only 1 day there, post last hospital admission, he could not walk or stand on his own. [name] said she was placing towels and paper towels in the bed so [name] could have a bowel movement because she was unable to help him out of bed to the bathroom. Mrs. Schnelle provided custodial care and cleaned up after [name] thereafter but said she felt it was "not right" for that situation to continue. [name] is over 37f tall and weighs more than 350 pounds. [name] is 582ftall and somewhat frail, struggling with her own health conditions.   I discussed with [name] that should [name] home in the near future, she would be able to work with the social worker ShEngineer, maintenance (IT)t [facility] make arrangements for home health  services and needed durable medical equipment. I offered to help her get connected with [facility] or any other agency of Mr. Shock's/her choosing for private pay home care services. [name] said she felt they could afford the out of pocket cost. I encouraged [name] to remind [name] to remind him that his best chance of being able to come home and stay there was to gain as much strength and health as possible [facility] to [facility] return. She says that he is very anxious to leave the SNF and return home.   I will follow up with [facility] with [name] next week.   THN CM Care Plan Problem One        Most Recent Value   Care Plan Problem One  Level of Care Concners   Role Documenting the Problem One  Care Management Coordinator   Care Plan for Problem One  Active   THN Long Term Goal (31-90 days)  Over the next 60 days patient and family will establish long term plan of care   THN Long Term Goal Start Date  04/26/15   [facility] Met Date  03/28/15 [not met]   Interventions for Problem One Long Term Goal  Reviewied with spouse options for long term care including SNF and in home care services/private duty care   [facility] Short  Term Goal #1 (0-30 days)  Over the next 30 days, spouse will discuss with patient and children long term care options as evidenced by her (and children's) report of same   [facility] CM Short Term Goal #1 Start Date  04/26/15   [facility] CM Short Term Goal #1 Met Date     Interventions for Short Term Goal #1  encouraged spouse to talk with patient at SNF today and children over the course of next few weeks, long term care options    [place] Management  [phone]

## 2015-05-24 ENCOUNTER — Telehealth: Payer: Self-pay | Admitting: Cardiology

## 2015-05-24 DIAGNOSIS — I5033 Acute on chronic diastolic (congestive) heart failure: Secondary | ICD-10-CM

## 2015-05-24 NOTE — Telephone Encounter (Addendum)
Gwenyth [name] (son) called wanting to let Dr. Domenic Polite know that his father [name] has lost 100 pounds. Mr. Jaracz wanted to know if patient needs to be followed up. He is at the [facility] at present time.

## 2015-05-25 ENCOUNTER — Other Ambulatory Visit: Payer: Self-pay | Admitting: *Deleted

## 2015-05-25 NOTE — Patient Outreach (Signed)
[facility]) Care Management  05/25/2015  QI GIGNAC October 01, 1931 [id number]  I spoke with Mrs. Dethloff today to follow up on Mr. Sonier progress at [facility] in [place]. Mrs. Hartinger relates that Mr. Stvil has lost more than 100# since he was admitted to the hospital late last year. She doesn't have any idea about an anticipated discharge date but will let me know when she hears anything about Mr. Demeo's pending discharge.   Plan: I will continue to follow Mr. Corella progress and plan a home visit upon discharge.    [place] Management  [phone]

## 2015-05-28 ENCOUNTER — Encounter: Payer: Self-pay | Admitting: *Deleted

## 2015-05-28 NOTE — Telephone Encounter (Signed)
I am not certain whether a follow-up visit was already made with me when he was discharged from [facility]. He should have an office follow-up visit scheduled, and should have a BMET obtained before that visit.

## 2015-05-29 NOTE — Telephone Encounter (Signed)
Appointment and lab work order mailed to patient.

## 2015-06-01 ENCOUNTER — Encounter: Payer: Self-pay | Admitting: *Deleted

## 2015-06-01 ENCOUNTER — Other Ambulatory Visit: Payer: Self-pay | Admitting: *Deleted

## 2015-06-01 NOTE — Patient Outreach (Signed)
[facility]) Care Management  06/01/2015  [name] 04-09-1932 [id number]   Call received from [place], [place] at [facility] [place].  Pt. Being discharged to home today.    Plan: I will reach out to [name] on Monday to discuss transition to home and scheduling face to face visit.   [place] Management  [phone]

## 2015-06-03 DIAGNOSIS — G894 Chronic pain syndrome: Secondary | ICD-10-CM | POA: Diagnosis not present

## 2015-06-03 DIAGNOSIS — E1122 Type 2 diabetes mellitus with diabetic chronic kidney disease: Secondary | ICD-10-CM | POA: Diagnosis not present

## 2015-06-03 DIAGNOSIS — I480 Paroxysmal atrial fibrillation: Secondary | ICD-10-CM | POA: Diagnosis not present

## 2015-06-03 DIAGNOSIS — E669 Obesity, unspecified: Secondary | ICD-10-CM | POA: Diagnosis not present

## 2015-06-03 DIAGNOSIS — N183 Chronic kidney disease, stage 3 (moderate): Secondary | ICD-10-CM | POA: Diagnosis not present

## 2015-06-03 DIAGNOSIS — I130 Hypertensive heart and chronic kidney disease with heart failure and stage 1 through stage 4 chronic kidney disease, or unspecified chronic kidney disease: Secondary | ICD-10-CM | POA: Diagnosis not present

## 2015-06-03 DIAGNOSIS — I2510 Atherosclerotic heart disease of native coronary artery without angina pectoris: Secondary | ICD-10-CM | POA: Diagnosis not present

## 2015-06-03 DIAGNOSIS — Z794 Long term (current) use of insulin: Secondary | ICD-10-CM | POA: Diagnosis not present

## 2015-06-03 DIAGNOSIS — I5033 Acute on chronic diastolic (congestive) heart failure: Secondary | ICD-10-CM | POA: Diagnosis not present

## 2015-06-03 DIAGNOSIS — I10 Essential (primary) hypertension: Secondary | ICD-10-CM | POA: Diagnosis not present

## 2015-06-03 DIAGNOSIS — K7460 Unspecified cirrhosis of liver: Secondary | ICD-10-CM | POA: Diagnosis not present

## 2015-06-03 DIAGNOSIS — M6281 Muscle weakness (generalized): Secondary | ICD-10-CM | POA: Diagnosis not present

## 2015-06-04 DIAGNOSIS — T1490 Injury, unspecified: Secondary | ICD-10-CM | POA: Diagnosis not present

## 2015-06-05 DIAGNOSIS — E1122 Type 2 diabetes mellitus with diabetic chronic kidney disease: Secondary | ICD-10-CM | POA: Diagnosis not present

## 2015-06-05 DIAGNOSIS — I130 Hypertensive heart and chronic kidney disease with heart failure and stage 1 through stage 4 chronic kidney disease, or unspecified chronic kidney disease: Secondary | ICD-10-CM | POA: Diagnosis not present

## 2015-06-05 DIAGNOSIS — I480 Paroxysmal atrial fibrillation: Secondary | ICD-10-CM | POA: Diagnosis not present

## 2015-06-05 DIAGNOSIS — N183 Chronic kidney disease, stage 3 (moderate): Secondary | ICD-10-CM | POA: Diagnosis not present

## 2015-06-05 DIAGNOSIS — I5033 Acute on chronic diastolic (congestive) heart failure: Secondary | ICD-10-CM | POA: Diagnosis not present

## 2015-06-05 DIAGNOSIS — I2510 Atherosclerotic heart disease of native coronary artery without angina pectoris: Secondary | ICD-10-CM | POA: Diagnosis not present

## 2015-06-06 DIAGNOSIS — N183 Chronic kidney disease, stage 3 (moderate): Secondary | ICD-10-CM | POA: Diagnosis not present

## 2015-06-06 DIAGNOSIS — I130 Hypertensive heart and chronic kidney disease with heart failure and stage 1 through stage 4 chronic kidney disease, or unspecified chronic kidney disease: Secondary | ICD-10-CM | POA: Diagnosis not present

## 2015-06-06 DIAGNOSIS — I480 Paroxysmal atrial fibrillation: Secondary | ICD-10-CM | POA: Diagnosis not present

## 2015-06-06 DIAGNOSIS — I2510 Atherosclerotic heart disease of native coronary artery without angina pectoris: Secondary | ICD-10-CM | POA: Diagnosis not present

## 2015-06-06 DIAGNOSIS — E1122 Type 2 diabetes mellitus with diabetic chronic kidney disease: Secondary | ICD-10-CM | POA: Diagnosis not present

## 2015-06-06 DIAGNOSIS — I5033 Acute on chronic diastolic (congestive) heart failure: Secondary | ICD-10-CM | POA: Diagnosis not present

## 2015-06-07 DIAGNOSIS — I130 Hypertensive heart and chronic kidney disease with heart failure and stage 1 through stage 4 chronic kidney disease, or unspecified chronic kidney disease: Secondary | ICD-10-CM | POA: Diagnosis not present

## 2015-06-07 DIAGNOSIS — N183 Chronic kidney disease, stage 3 (moderate): Secondary | ICD-10-CM | POA: Diagnosis not present

## 2015-06-07 DIAGNOSIS — I480 Paroxysmal atrial fibrillation: Secondary | ICD-10-CM | POA: Diagnosis not present

## 2015-06-07 DIAGNOSIS — I2510 Atherosclerotic heart disease of native coronary artery without angina pectoris: Secondary | ICD-10-CM | POA: Diagnosis not present

## 2015-06-07 DIAGNOSIS — E1122 Type 2 diabetes mellitus with diabetic chronic kidney disease: Secondary | ICD-10-CM | POA: Diagnosis not present

## 2015-06-07 DIAGNOSIS — I5033 Acute on chronic diastolic (congestive) heart failure: Secondary | ICD-10-CM | POA: Diagnosis not present

## 2015-06-08 ENCOUNTER — Other Ambulatory Visit: Payer: Self-pay | Admitting: *Deleted

## 2015-06-08 DIAGNOSIS — I130 Hypertensive heart and chronic kidney disease with heart failure and stage 1 through stage 4 chronic kidney disease, or unspecified chronic kidney disease: Secondary | ICD-10-CM | POA: Diagnosis not present

## 2015-06-08 DIAGNOSIS — I2510 Atherosclerotic heart disease of native coronary artery without angina pectoris: Secondary | ICD-10-CM | POA: Diagnosis not present

## 2015-06-08 DIAGNOSIS — I480 Paroxysmal atrial fibrillation: Secondary | ICD-10-CM | POA: Diagnosis not present

## 2015-06-08 DIAGNOSIS — E1122 Type 2 diabetes mellitus with diabetic chronic kidney disease: Secondary | ICD-10-CM | POA: Diagnosis not present

## 2015-06-08 DIAGNOSIS — I5033 Acute on chronic diastolic (congestive) heart failure: Secondary | ICD-10-CM | POA: Diagnosis not present

## 2015-06-08 DIAGNOSIS — N183 Chronic kidney disease, stage 3 (moderate): Secondary | ICD-10-CM | POA: Diagnosis not present

## 2015-06-08 NOTE — Patient Outreach (Addendum)
[facility] [place] [facility]) Care Management  06/08/2015  SAMI BLUNK May 20, 1931 [id number]  I reached out to Mr. Jahoda at home today, understanding that he was discharged from [facility] on Friday. I was unable to reach Mr. Pamintuan on a prior telephone outreach earlier in the week. Mrs. Jansson answered the phone telling me that Mr. [facility] nurse Lorenza Chick from [place] [phone]) visited earlier and based on  her assessment, recommended that Mr. Warzecha go to the hospital but he would not agree.   I reached out to [facility] who reported that Mr. Sparger was very confused during her visit and per Mrs. Lemmerman's report had been confused for approximately the last 24 hours. Mrs. Quaranta reported to [place] 3 incidents of urinary incontinence in that 24 hour period and a fall from the bed sometime in the last 72 hours. Mrs. Hellenbrand called EMS to help her get Mr. Estabrook back to bed.   Lorenza Chick and I individually advised Mrs. Gunderman to call 911 to have Mr. Vong transported to the emergency department but she declined stating he didn't want to go and she wasn't going go "go against his wishes". Lorenza Chick called Mr. Boxley's son and daughter, both of whom stated Mr. Nuding told them he "didn't want to hear anything they had to say."   Plan: I will call Mrs. Lawson again on Monday morning to follow up.    [place] Management  [phone]

## 2015-06-10 DIAGNOSIS — F4489 Other dissociative and conversion disorders: Secondary | ICD-10-CM | POA: Diagnosis not present

## 2015-06-10 DIAGNOSIS — I130 Hypertensive heart and chronic kidney disease with heart failure and stage 1 through stage 4 chronic kidney disease, or unspecified chronic kidney disease: Secondary | ICD-10-CM | POA: Diagnosis present

## 2015-06-10 DIAGNOSIS — H5442 Blindness, left eye, normal vision right eye: Secondary | ICD-10-CM | POA: Diagnosis present

## 2015-06-10 DIAGNOSIS — R110 Nausea: Secondary | ICD-10-CM | POA: Diagnosis not present

## 2015-06-10 DIAGNOSIS — K8020 Calculus of gallbladder without cholecystitis without obstruction: Secondary | ICD-10-CM | POA: Diagnosis not present

## 2015-06-10 DIAGNOSIS — Z743 Need for continuous supervision: Secondary | ICD-10-CM | POA: Diagnosis not present

## 2015-06-10 DIAGNOSIS — E7220 Disorder of urea cycle metabolism, unspecified: Secondary | ICD-10-CM | POA: Diagnosis not present

## 2015-06-10 DIAGNOSIS — G8929 Other chronic pain: Secondary | ICD-10-CM | POA: Diagnosis not present

## 2015-06-10 DIAGNOSIS — K7210 Chronic hepatic failure without coma: Secondary | ICD-10-CM | POA: Diagnosis not present

## 2015-06-10 DIAGNOSIS — R279 Unspecified lack of coordination: Secondary | ICD-10-CM | POA: Diagnosis not present

## 2015-06-10 DIAGNOSIS — N401 Enlarged prostate with lower urinary tract symptoms: Secondary | ICD-10-CM | POA: Diagnosis not present

## 2015-06-10 DIAGNOSIS — E876 Hypokalemia: Secondary | ICD-10-CM | POA: Diagnosis not present

## 2015-06-10 DIAGNOSIS — R531 Weakness: Secondary | ICD-10-CM | POA: Diagnosis not present

## 2015-06-10 DIAGNOSIS — N400 Enlarged prostate without lower urinary tract symptoms: Secondary | ICD-10-CM | POA: Diagnosis present

## 2015-06-10 DIAGNOSIS — I10 Essential (primary) hypertension: Secondary | ICD-10-CM | POA: Diagnosis not present

## 2015-06-10 DIAGNOSIS — R55 Syncope and collapse: Secondary | ICD-10-CM | POA: Diagnosis not present

## 2015-06-10 DIAGNOSIS — R1312 Dysphagia, oropharyngeal phase: Secondary | ICD-10-CM | POA: Diagnosis not present

## 2015-06-10 DIAGNOSIS — F330 Major depressive disorder, recurrent, mild: Secondary | ICD-10-CM | POA: Diagnosis not present

## 2015-06-10 DIAGNOSIS — N184 Chronic kidney disease, stage 4 (severe): Secondary | ICD-10-CM | POA: Diagnosis not present

## 2015-06-10 DIAGNOSIS — R278 Other lack of coordination: Secondary | ICD-10-CM | POA: Diagnosis not present

## 2015-06-10 DIAGNOSIS — J811 Chronic pulmonary edema: Secondary | ICD-10-CM | POA: Diagnosis not present

## 2015-06-10 DIAGNOSIS — E1321 Other specified diabetes mellitus with diabetic nephropathy: Secondary | ICD-10-CM | POA: Diagnosis not present

## 2015-06-10 DIAGNOSIS — K7581 Nonalcoholic steatohepatitis (NASH): Secondary | ICD-10-CM | POA: Diagnosis present

## 2015-06-10 DIAGNOSIS — E7800 Pure hypercholesterolemia, unspecified: Secondary | ICD-10-CM | POA: Diagnosis present

## 2015-06-10 DIAGNOSIS — D6959 Other secondary thrombocytopenia: Secondary | ICD-10-CM | POA: Diagnosis present

## 2015-06-10 DIAGNOSIS — Z79891 Long term (current) use of opiate analgesic: Secondary | ICD-10-CM | POA: Diagnosis not present

## 2015-06-10 DIAGNOSIS — D631 Anemia in chronic kidney disease: Secondary | ICD-10-CM | POA: Diagnosis not present

## 2015-06-10 DIAGNOSIS — G4730 Sleep apnea, unspecified: Secondary | ICD-10-CM | POA: Diagnosis present

## 2015-06-10 DIAGNOSIS — M6281 Muscle weakness (generalized): Secondary | ICD-10-CM | POA: Diagnosis not present

## 2015-06-10 DIAGNOSIS — Z880 Allergy status to penicillin: Secondary | ICD-10-CM | POA: Diagnosis not present

## 2015-06-10 DIAGNOSIS — Z7951 Long term (current) use of inhaled steroids: Secondary | ICD-10-CM | POA: Diagnosis not present

## 2015-06-10 DIAGNOSIS — D696 Thrombocytopenia, unspecified: Secondary | ICD-10-CM | POA: Diagnosis not present

## 2015-06-10 DIAGNOSIS — J301 Allergic rhinitis due to pollen: Secondary | ICD-10-CM | POA: Diagnosis not present

## 2015-06-10 DIAGNOSIS — G9340 Encephalopathy, unspecified: Secondary | ICD-10-CM | POA: Diagnosis not present

## 2015-06-10 DIAGNOSIS — Z79899 Other long term (current) drug therapy: Secondary | ICD-10-CM | POA: Diagnosis not present

## 2015-06-10 DIAGNOSIS — D649 Anemia, unspecified: Secondary | ICD-10-CM | POA: Diagnosis not present

## 2015-06-10 DIAGNOSIS — R41841 Cognitive communication deficit: Secondary | ICD-10-CM | POA: Diagnosis not present

## 2015-06-10 DIAGNOSIS — E1122 Type 2 diabetes mellitus with diabetic chronic kidney disease: Secondary | ICD-10-CM | POA: Diagnosis present

## 2015-06-10 DIAGNOSIS — K7290 Hepatic failure, unspecified without coma: Secondary | ICD-10-CM | POA: Diagnosis not present

## 2015-06-10 DIAGNOSIS — E669 Obesity, unspecified: Secondary | ICD-10-CM | POA: Diagnosis not present

## 2015-06-10 DIAGNOSIS — E1165 Type 2 diabetes mellitus with hyperglycemia: Secondary | ICD-10-CM | POA: Diagnosis not present

## 2015-06-10 DIAGNOSIS — Z6838 Body mass index (BMI) 38.0-38.9, adult: Secondary | ICD-10-CM | POA: Diagnosis not present

## 2015-06-10 DIAGNOSIS — Z888 Allergy status to other drugs, medicaments and biological substances status: Secondary | ICD-10-CM | POA: Diagnosis not present

## 2015-06-10 DIAGNOSIS — I517 Cardiomegaly: Secondary | ICD-10-CM | POA: Diagnosis not present

## 2015-06-10 DIAGNOSIS — Z881 Allergy status to other antibiotic agents status: Secondary | ICD-10-CM | POA: Diagnosis not present

## 2015-06-10 DIAGNOSIS — R402411 Glasgow coma scale score 13-15, in the field [EMT or ambulance]: Secondary | ICD-10-CM | POA: Diagnosis not present

## 2015-06-10 DIAGNOSIS — R4182 Altered mental status, unspecified: Secondary | ICD-10-CM | POA: Diagnosis not present

## 2015-06-10 DIAGNOSIS — Z794 Long term (current) use of insulin: Secondary | ICD-10-CM | POA: Diagnosis not present

## 2015-06-10 DIAGNOSIS — K7460 Unspecified cirrhosis of liver: Secondary | ICD-10-CM | POA: Diagnosis not present

## 2015-06-10 DIAGNOSIS — N39498 Other specified urinary incontinence: Secondary | ICD-10-CM | POA: Diagnosis not present

## 2015-06-10 DIAGNOSIS — Z7982 Long term (current) use of aspirin: Secondary | ICD-10-CM | POA: Diagnosis not present

## 2015-06-10 DIAGNOSIS — G894 Chronic pain syndrome: Secondary | ICD-10-CM | POA: Diagnosis present

## 2015-06-10 DIAGNOSIS — I5032 Chronic diastolic (congestive) heart failure: Secondary | ICD-10-CM | POA: Diagnosis not present

## 2015-06-12 ENCOUNTER — Other Ambulatory Visit: Payer: Self-pay | Admitting: *Deleted

## 2015-06-12 NOTE — Patient Outreach (Signed)
[name]) Care Management  06/12/2015  [name] 1931/10/21 [id number]  I was unable to reach [name] or his wife by phone yesterday to follow up on [name] condition (see note from 06/08/15). I reached [name] son [name] by his cell phone today and learned that [name] was admitted to [name] over the weekend.   Plan: I will reach out to [name] and [name] again in a few days to follow up on his progress.    [place] Management  [phone]

## 2015-06-13 DIAGNOSIS — D565 Hemoglobin E-beta thalassemia: Secondary | ICD-10-CM | POA: Diagnosis not present

## 2015-06-13 DIAGNOSIS — I10 Essential (primary) hypertension: Secondary | ICD-10-CM | POA: Diagnosis not present

## 2015-06-13 DIAGNOSIS — R1312 Dysphagia, oropharyngeal phase: Secondary | ICD-10-CM | POA: Diagnosis not present

## 2015-06-13 DIAGNOSIS — K7460 Unspecified cirrhosis of liver: Secondary | ICD-10-CM | POA: Diagnosis not present

## 2015-06-13 DIAGNOSIS — N401 Enlarged prostate with lower urinary tract symptoms: Secondary | ICD-10-CM | POA: Diagnosis not present

## 2015-06-13 DIAGNOSIS — R278 Other lack of coordination: Secondary | ICD-10-CM | POA: Diagnosis not present

## 2015-06-13 DIAGNOSIS — G4739 Other sleep apnea: Secondary | ICD-10-CM | POA: Diagnosis not present

## 2015-06-13 DIAGNOSIS — D509 Iron deficiency anemia, unspecified: Secondary | ICD-10-CM | POA: Diagnosis not present

## 2015-06-13 DIAGNOSIS — N184 Chronic kidney disease, stage 4 (severe): Secondary | ICD-10-CM | POA: Diagnosis not present

## 2015-06-13 DIAGNOSIS — N390 Urinary tract infection, site not specified: Secondary | ICD-10-CM | POA: Diagnosis not present

## 2015-06-13 DIAGNOSIS — M6281 Muscle weakness (generalized): Secondary | ICD-10-CM | POA: Diagnosis not present

## 2015-06-13 DIAGNOSIS — R279 Unspecified lack of coordination: Secondary | ICD-10-CM | POA: Diagnosis not present

## 2015-06-13 DIAGNOSIS — E876 Hypokalemia: Secondary | ICD-10-CM | POA: Diagnosis not present

## 2015-06-13 DIAGNOSIS — R531 Weakness: Secondary | ICD-10-CM | POA: Diagnosis not present

## 2015-06-13 DIAGNOSIS — G8929 Other chronic pain: Secondary | ICD-10-CM | POA: Diagnosis not present

## 2015-06-13 DIAGNOSIS — G4730 Sleep apnea, unspecified: Secondary | ICD-10-CM | POA: Diagnosis not present

## 2015-06-13 DIAGNOSIS — I5032 Chronic diastolic (congestive) heart failure: Secondary | ICD-10-CM | POA: Diagnosis not present

## 2015-06-13 DIAGNOSIS — R41841 Cognitive communication deficit: Secondary | ICD-10-CM | POA: Diagnosis not present

## 2015-06-13 DIAGNOSIS — E1321 Other specified diabetes mellitus with diabetic nephropathy: Secondary | ICD-10-CM | POA: Diagnosis not present

## 2015-06-13 DIAGNOSIS — Z743 Need for continuous supervision: Secondary | ICD-10-CM | POA: Diagnosis not present

## 2015-06-13 DIAGNOSIS — G9340 Encephalopathy, unspecified: Secondary | ICD-10-CM | POA: Diagnosis not present

## 2015-06-13 DIAGNOSIS — N4289 Other specified disorders of prostate: Secondary | ICD-10-CM | POA: Diagnosis not present

## 2015-06-13 DIAGNOSIS — D696 Thrombocytopenia, unspecified: Secondary | ICD-10-CM | POA: Diagnosis not present

## 2015-06-13 DIAGNOSIS — Z79899 Other long term (current) drug therapy: Secondary | ICD-10-CM | POA: Diagnosis not present

## 2015-06-13 DIAGNOSIS — E669 Obesity, unspecified: Secondary | ICD-10-CM | POA: Diagnosis not present

## 2015-06-13 DIAGNOSIS — J811 Chronic pulmonary edema: Secondary | ICD-10-CM | POA: Diagnosis not present

## 2015-06-13 DIAGNOSIS — E1151 Type 2 diabetes mellitus with diabetic peripheral angiopathy without gangrene: Secondary | ICD-10-CM | POA: Diagnosis not present

## 2015-06-13 DIAGNOSIS — A047 Enterocolitis due to Clostridium difficile: Secondary | ICD-10-CM | POA: Diagnosis not present

## 2015-06-13 DIAGNOSIS — K7210 Chronic hepatic failure without coma: Secondary | ICD-10-CM | POA: Diagnosis not present

## 2015-06-13 DIAGNOSIS — N39498 Other specified urinary incontinence: Secondary | ICD-10-CM | POA: Diagnosis not present

## 2015-06-13 DIAGNOSIS — F330 Major depressive disorder, recurrent, mild: Secondary | ICD-10-CM | POA: Diagnosis not present

## 2015-06-13 DIAGNOSIS — K7469 Other cirrhosis of liver: Secondary | ICD-10-CM | POA: Diagnosis not present

## 2015-06-13 DIAGNOSIS — J301 Allergic rhinitis due to pollen: Secondary | ICD-10-CM | POA: Diagnosis not present

## 2015-06-13 DIAGNOSIS — F419 Anxiety disorder, unspecified: Secondary | ICD-10-CM | POA: Diagnosis not present

## 2015-06-13 DIAGNOSIS — D631 Anemia in chronic kidney disease: Secondary | ICD-10-CM | POA: Diagnosis not present

## 2015-06-13 DIAGNOSIS — F329 Major depressive disorder, single episode, unspecified: Secondary | ICD-10-CM | POA: Diagnosis not present

## 2015-06-13 DIAGNOSIS — E1165 Type 2 diabetes mellitus with hyperglycemia: Secondary | ICD-10-CM | POA: Diagnosis not present

## 2015-06-13 DIAGNOSIS — F060 Psychotic disorder with hallucinations due to known physiological condition: Secondary | ICD-10-CM | POA: Diagnosis not present

## 2015-06-13 DIAGNOSIS — D649 Anemia, unspecified: Secondary | ICD-10-CM | POA: Diagnosis not present

## 2015-06-15 ENCOUNTER — Other Ambulatory Visit: Payer: Self-pay | Admitting: *Deleted

## 2015-06-22 ENCOUNTER — Other Ambulatory Visit: Payer: Self-pay | Admitting: *Deleted

## 2015-06-22 ENCOUNTER — Encounter: Payer: Self-pay | Admitting: *Deleted

## 2015-06-22 NOTE — Patient Outreach (Signed)
[place]) Care Management  06/22/2015  [name] 01-03-1932 [id number]  I have been unable to reach Mr. Or Mrs. Mcmullen by phone since Mr. Hollerbach was last hospitalized. I found out that he transferred to [facility] [place] this week after discharge from [facility]. I reached out to [employer], Social Worker [phone]) at [facility] and she confirmed that Mr. Clemon has been admitted to that facility and plans to stay there long term.   I will send a case closure letter to Mrs. Meuser and will notify Mr. Mooney's provider of his discharge from [place] Management services.     [place] Management  [phone]

## 2015-07-06 ENCOUNTER — Encounter: Payer: Medicare Other | Admitting: Cardiology

## 2015-07-06 ENCOUNTER — Encounter: Payer: Self-pay | Admitting: Cardiology

## 2015-07-06 NOTE — Progress Notes (Signed)
No show  This encounter was created in error - please disregard.

## 2015-07-17 DIAGNOSIS — F419 Anxiety disorder, unspecified: Secondary | ICD-10-CM | POA: Diagnosis not present

## 2015-07-17 DIAGNOSIS — F329 Major depressive disorder, single episode, unspecified: Secondary | ICD-10-CM | POA: Diagnosis not present

## 2015-07-17 DIAGNOSIS — F060 Psychotic disorder with hallucinations due to known physiological condition: Secondary | ICD-10-CM | POA: Diagnosis not present

## 2015-07-31 DIAGNOSIS — F329 Major depressive disorder, single episode, unspecified: Secondary | ICD-10-CM | POA: Diagnosis not present

## 2015-07-31 DIAGNOSIS — F060 Psychotic disorder with hallucinations due to known physiological condition: Secondary | ICD-10-CM | POA: Diagnosis not present

## 2015-07-31 DIAGNOSIS — F419 Anxiety disorder, unspecified: Secondary | ICD-10-CM | POA: Diagnosis not present

## 2015-08-09 DIAGNOSIS — N4289 Other specified disorders of prostate: Secondary | ICD-10-CM | POA: Diagnosis not present

## 2015-08-09 DIAGNOSIS — G4739 Other sleep apnea: Secondary | ICD-10-CM | POA: Diagnosis not present

## 2015-08-09 DIAGNOSIS — K7469 Other cirrhosis of liver: Secondary | ICD-10-CM | POA: Diagnosis not present

## 2015-08-21 DIAGNOSIS — D649 Anemia, unspecified: Secondary | ICD-10-CM | POA: Diagnosis not present

## 2015-08-21 DIAGNOSIS — E291 Testicular hypofunction: Secondary | ICD-10-CM | POA: Diagnosis not present

## 2015-08-21 DIAGNOSIS — I5030 Unspecified diastolic (congestive) heart failure: Secondary | ICD-10-CM | POA: Diagnosis not present

## 2015-08-21 DIAGNOSIS — G4733 Obstructive sleep apnea (adult) (pediatric): Secondary | ICD-10-CM | POA: Diagnosis not present

## 2015-08-21 DIAGNOSIS — K7469 Other cirrhosis of liver: Secondary | ICD-10-CM | POA: Diagnosis not present

## 2015-08-21 DIAGNOSIS — F330 Major depressive disorder, recurrent, mild: Secondary | ICD-10-CM | POA: Diagnosis not present

## 2015-08-21 DIAGNOSIS — I10 Essential (primary) hypertension: Secondary | ICD-10-CM | POA: Diagnosis not present

## 2015-08-21 DIAGNOSIS — E119 Type 2 diabetes mellitus without complications: Secondary | ICD-10-CM | POA: Diagnosis not present

## 2015-08-21 DIAGNOSIS — F411 Generalized anxiety disorder: Secondary | ICD-10-CM | POA: Diagnosis not present

## 2015-08-21 DIAGNOSIS — E1151 Type 2 diabetes mellitus with diabetic peripheral angiopathy without gangrene: Secondary | ICD-10-CM | POA: Diagnosis not present

## 2015-08-21 DIAGNOSIS — N184 Chronic kidney disease, stage 4 (severe): Secondary | ICD-10-CM | POA: Diagnosis not present

## 2015-08-21 DIAGNOSIS — I130 Hypertensive heart and chronic kidney disease with heart failure and stage 1 through stage 4 chronic kidney disease, or unspecified chronic kidney disease: Secondary | ICD-10-CM | POA: Diagnosis not present

## 2015-08-21 DIAGNOSIS — D696 Thrombocytopenia, unspecified: Secondary | ICD-10-CM | POA: Diagnosis not present

## 2015-08-21 DIAGNOSIS — I509 Heart failure, unspecified: Secondary | ICD-10-CM | POA: Diagnosis not present

## 2015-08-21 DIAGNOSIS — N400 Enlarged prostate without lower urinary tract symptoms: Secondary | ICD-10-CM | POA: Diagnosis not present

## 2015-08-21 DIAGNOSIS — E875 Hyperkalemia: Secondary | ICD-10-CM | POA: Diagnosis not present

## 2015-08-21 DIAGNOSIS — G8929 Other chronic pain: Secondary | ICD-10-CM | POA: Diagnosis not present

## 2015-08-21 DIAGNOSIS — E1122 Type 2 diabetes mellitus with diabetic chronic kidney disease: Secondary | ICD-10-CM | POA: Diagnosis not present

## 2015-08-21 DIAGNOSIS — Z794 Long term (current) use of insulin: Secondary | ICD-10-CM | POA: Diagnosis not present

## 2015-08-22 DIAGNOSIS — N184 Chronic kidney disease, stage 4 (severe): Secondary | ICD-10-CM | POA: Diagnosis not present

## 2015-08-22 DIAGNOSIS — E1151 Type 2 diabetes mellitus with diabetic peripheral angiopathy without gangrene: Secondary | ICD-10-CM | POA: Diagnosis not present

## 2015-08-22 DIAGNOSIS — K7469 Other cirrhosis of liver: Secondary | ICD-10-CM | POA: Diagnosis not present

## 2015-08-22 DIAGNOSIS — I130 Hypertensive heart and chronic kidney disease with heart failure and stage 1 through stage 4 chronic kidney disease, or unspecified chronic kidney disease: Secondary | ICD-10-CM | POA: Diagnosis not present

## 2015-08-22 DIAGNOSIS — E1122 Type 2 diabetes mellitus with diabetic chronic kidney disease: Secondary | ICD-10-CM | POA: Diagnosis not present

## 2015-08-22 DIAGNOSIS — I5030 Unspecified diastolic (congestive) heart failure: Secondary | ICD-10-CM | POA: Diagnosis not present

## 2015-08-23 DIAGNOSIS — E1122 Type 2 diabetes mellitus with diabetic chronic kidney disease: Secondary | ICD-10-CM | POA: Diagnosis not present

## 2015-08-23 DIAGNOSIS — I130 Hypertensive heart and chronic kidney disease with heart failure and stage 1 through stage 4 chronic kidney disease, or unspecified chronic kidney disease: Secondary | ICD-10-CM | POA: Diagnosis not present

## 2015-08-23 DIAGNOSIS — E1151 Type 2 diabetes mellitus with diabetic peripheral angiopathy without gangrene: Secondary | ICD-10-CM | POA: Diagnosis not present

## 2015-08-23 DIAGNOSIS — N184 Chronic kidney disease, stage 4 (severe): Secondary | ICD-10-CM | POA: Diagnosis not present

## 2015-08-23 DIAGNOSIS — K7469 Other cirrhosis of liver: Secondary | ICD-10-CM | POA: Diagnosis not present

## 2015-08-23 DIAGNOSIS — I5030 Unspecified diastolic (congestive) heart failure: Secondary | ICD-10-CM | POA: Diagnosis not present

## 2015-08-28 DIAGNOSIS — I130 Hypertensive heart and chronic kidney disease with heart failure and stage 1 through stage 4 chronic kidney disease, or unspecified chronic kidney disease: Secondary | ICD-10-CM | POA: Diagnosis not present

## 2015-08-28 DIAGNOSIS — E1122 Type 2 diabetes mellitus with diabetic chronic kidney disease: Secondary | ICD-10-CM | POA: Diagnosis not present

## 2015-08-28 DIAGNOSIS — K7469 Other cirrhosis of liver: Secondary | ICD-10-CM | POA: Diagnosis not present

## 2015-08-28 DIAGNOSIS — N184 Chronic kidney disease, stage 4 (severe): Secondary | ICD-10-CM | POA: Diagnosis not present

## 2015-08-28 DIAGNOSIS — E1151 Type 2 diabetes mellitus with diabetic peripheral angiopathy without gangrene: Secondary | ICD-10-CM | POA: Diagnosis not present

## 2015-08-28 DIAGNOSIS — I5030 Unspecified diastolic (congestive) heart failure: Secondary | ICD-10-CM | POA: Diagnosis not present

## 2015-08-29 DIAGNOSIS — E1122 Type 2 diabetes mellitus with diabetic chronic kidney disease: Secondary | ICD-10-CM | POA: Diagnosis not present

## 2015-08-29 DIAGNOSIS — N184 Chronic kidney disease, stage 4 (severe): Secondary | ICD-10-CM | POA: Diagnosis not present

## 2015-08-29 DIAGNOSIS — E1151 Type 2 diabetes mellitus with diabetic peripheral angiopathy without gangrene: Secondary | ICD-10-CM | POA: Diagnosis not present

## 2015-08-29 DIAGNOSIS — K7469 Other cirrhosis of liver: Secondary | ICD-10-CM | POA: Diagnosis not present

## 2015-08-29 DIAGNOSIS — I5030 Unspecified diastolic (congestive) heart failure: Secondary | ICD-10-CM | POA: Diagnosis not present

## 2015-08-29 DIAGNOSIS — I130 Hypertensive heart and chronic kidney disease with heart failure and stage 1 through stage 4 chronic kidney disease, or unspecified chronic kidney disease: Secondary | ICD-10-CM | POA: Diagnosis not present

## 2015-08-30 DIAGNOSIS — I5030 Unspecified diastolic (congestive) heart failure: Secondary | ICD-10-CM | POA: Diagnosis not present

## 2015-08-30 DIAGNOSIS — E1151 Type 2 diabetes mellitus with diabetic peripheral angiopathy without gangrene: Secondary | ICD-10-CM | POA: Diagnosis not present

## 2015-08-30 DIAGNOSIS — N184 Chronic kidney disease, stage 4 (severe): Secondary | ICD-10-CM | POA: Diagnosis not present

## 2015-08-30 DIAGNOSIS — I130 Hypertensive heart and chronic kidney disease with heart failure and stage 1 through stage 4 chronic kidney disease, or unspecified chronic kidney disease: Secondary | ICD-10-CM | POA: Diagnosis not present

## 2015-08-30 DIAGNOSIS — K7469 Other cirrhosis of liver: Secondary | ICD-10-CM | POA: Diagnosis not present

## 2015-08-30 DIAGNOSIS — E1122 Type 2 diabetes mellitus with diabetic chronic kidney disease: Secondary | ICD-10-CM | POA: Diagnosis not present

## 2015-09-04 DIAGNOSIS — E1151 Type 2 diabetes mellitus with diabetic peripheral angiopathy without gangrene: Secondary | ICD-10-CM | POA: Diagnosis not present

## 2015-09-04 DIAGNOSIS — E1122 Type 2 diabetes mellitus with diabetic chronic kidney disease: Secondary | ICD-10-CM | POA: Diagnosis not present

## 2015-09-04 DIAGNOSIS — I5030 Unspecified diastolic (congestive) heart failure: Secondary | ICD-10-CM | POA: Diagnosis not present

## 2015-09-04 DIAGNOSIS — K7469 Other cirrhosis of liver: Secondary | ICD-10-CM | POA: Diagnosis not present

## 2015-09-04 DIAGNOSIS — I130 Hypertensive heart and chronic kidney disease with heart failure and stage 1 through stage 4 chronic kidney disease, or unspecified chronic kidney disease: Secondary | ICD-10-CM | POA: Diagnosis not present

## 2015-09-04 DIAGNOSIS — N184 Chronic kidney disease, stage 4 (severe): Secondary | ICD-10-CM | POA: Diagnosis not present

## 2015-09-06 DIAGNOSIS — K7469 Other cirrhosis of liver: Secondary | ICD-10-CM | POA: Diagnosis not present

## 2015-09-06 DIAGNOSIS — N184 Chronic kidney disease, stage 4 (severe): Secondary | ICD-10-CM | POA: Diagnosis not present

## 2015-09-06 DIAGNOSIS — I130 Hypertensive heart and chronic kidney disease with heart failure and stage 1 through stage 4 chronic kidney disease, or unspecified chronic kidney disease: Secondary | ICD-10-CM | POA: Diagnosis not present

## 2015-09-06 DIAGNOSIS — E1122 Type 2 diabetes mellitus with diabetic chronic kidney disease: Secondary | ICD-10-CM | POA: Diagnosis not present

## 2015-09-06 DIAGNOSIS — E1151 Type 2 diabetes mellitus with diabetic peripheral angiopathy without gangrene: Secondary | ICD-10-CM | POA: Diagnosis not present

## 2015-09-06 DIAGNOSIS — I5030 Unspecified diastolic (congestive) heart failure: Secondary | ICD-10-CM | POA: Diagnosis not present

## 2015-09-11 DIAGNOSIS — K7469 Other cirrhosis of liver: Secondary | ICD-10-CM | POA: Diagnosis not present

## 2015-09-11 DIAGNOSIS — I130 Hypertensive heart and chronic kidney disease with heart failure and stage 1 through stage 4 chronic kidney disease, or unspecified chronic kidney disease: Secondary | ICD-10-CM | POA: Diagnosis not present

## 2015-09-11 DIAGNOSIS — I5030 Unspecified diastolic (congestive) heart failure: Secondary | ICD-10-CM | POA: Diagnosis not present

## 2015-09-11 DIAGNOSIS — N184 Chronic kidney disease, stage 4 (severe): Secondary | ICD-10-CM | POA: Diagnosis not present

## 2015-09-11 DIAGNOSIS — E1122 Type 2 diabetes mellitus with diabetic chronic kidney disease: Secondary | ICD-10-CM | POA: Diagnosis not present

## 2015-09-11 DIAGNOSIS — E1151 Type 2 diabetes mellitus with diabetic peripheral angiopathy without gangrene: Secondary | ICD-10-CM | POA: Diagnosis not present

## 2015-09-13 DIAGNOSIS — E1151 Type 2 diabetes mellitus with diabetic peripheral angiopathy without gangrene: Secondary | ICD-10-CM | POA: Diagnosis not present

## 2015-09-13 DIAGNOSIS — I130 Hypertensive heart and chronic kidney disease with heart failure and stage 1 through stage 4 chronic kidney disease, or unspecified chronic kidney disease: Secondary | ICD-10-CM | POA: Diagnosis not present

## 2015-09-13 DIAGNOSIS — E1122 Type 2 diabetes mellitus with diabetic chronic kidney disease: Secondary | ICD-10-CM | POA: Diagnosis not present

## 2015-09-13 DIAGNOSIS — I5030 Unspecified diastolic (congestive) heart failure: Secondary | ICD-10-CM | POA: Diagnosis not present

## 2015-09-13 DIAGNOSIS — N184 Chronic kidney disease, stage 4 (severe): Secondary | ICD-10-CM | POA: Diagnosis not present

## 2015-09-13 DIAGNOSIS — K7469 Other cirrhosis of liver: Secondary | ICD-10-CM | POA: Diagnosis not present

## 2015-09-18 DIAGNOSIS — N184 Chronic kidney disease, stage 4 (severe): Secondary | ICD-10-CM | POA: Diagnosis not present

## 2015-09-18 DIAGNOSIS — I130 Hypertensive heart and chronic kidney disease with heart failure and stage 1 through stage 4 chronic kidney disease, or unspecified chronic kidney disease: Secondary | ICD-10-CM | POA: Diagnosis not present

## 2015-09-18 DIAGNOSIS — I5030 Unspecified diastolic (congestive) heart failure: Secondary | ICD-10-CM | POA: Diagnosis not present

## 2015-09-18 DIAGNOSIS — E1122 Type 2 diabetes mellitus with diabetic chronic kidney disease: Secondary | ICD-10-CM | POA: Diagnosis not present

## 2015-09-18 DIAGNOSIS — E1151 Type 2 diabetes mellitus with diabetic peripheral angiopathy without gangrene: Secondary | ICD-10-CM | POA: Diagnosis not present

## 2015-09-18 DIAGNOSIS — K7469 Other cirrhosis of liver: Secondary | ICD-10-CM | POA: Diagnosis not present

## 2015-09-20 DIAGNOSIS — K7469 Other cirrhosis of liver: Secondary | ICD-10-CM | POA: Diagnosis not present

## 2015-09-20 DIAGNOSIS — N184 Chronic kidney disease, stage 4 (severe): Secondary | ICD-10-CM | POA: Diagnosis not present

## 2015-09-20 DIAGNOSIS — I130 Hypertensive heart and chronic kidney disease with heart failure and stage 1 through stage 4 chronic kidney disease, or unspecified chronic kidney disease: Secondary | ICD-10-CM | POA: Diagnosis not present

## 2015-09-20 DIAGNOSIS — E1151 Type 2 diabetes mellitus with diabetic peripheral angiopathy without gangrene: Secondary | ICD-10-CM | POA: Diagnosis not present

## 2015-09-20 DIAGNOSIS — I5030 Unspecified diastolic (congestive) heart failure: Secondary | ICD-10-CM | POA: Diagnosis not present

## 2015-09-20 DIAGNOSIS — E1122 Type 2 diabetes mellitus with diabetic chronic kidney disease: Secondary | ICD-10-CM | POA: Diagnosis not present

## 2015-09-24 DIAGNOSIS — I130 Hypertensive heart and chronic kidney disease with heart failure and stage 1 through stage 4 chronic kidney disease, or unspecified chronic kidney disease: Secondary | ICD-10-CM | POA: Diagnosis not present

## 2015-09-24 DIAGNOSIS — E1151 Type 2 diabetes mellitus with diabetic peripheral angiopathy without gangrene: Secondary | ICD-10-CM | POA: Diagnosis not present

## 2015-09-24 DIAGNOSIS — I5030 Unspecified diastolic (congestive) heart failure: Secondary | ICD-10-CM | POA: Diagnosis not present

## 2015-09-24 DIAGNOSIS — E1122 Type 2 diabetes mellitus with diabetic chronic kidney disease: Secondary | ICD-10-CM | POA: Diagnosis not present

## 2015-09-24 DIAGNOSIS — K7469 Other cirrhosis of liver: Secondary | ICD-10-CM | POA: Diagnosis not present

## 2015-09-24 DIAGNOSIS — N184 Chronic kidney disease, stage 4 (severe): Secondary | ICD-10-CM | POA: Diagnosis not present

## 2015-09-28 DIAGNOSIS — I130 Hypertensive heart and chronic kidney disease with heart failure and stage 1 through stage 4 chronic kidney disease, or unspecified chronic kidney disease: Secondary | ICD-10-CM | POA: Diagnosis not present

## 2015-09-28 DIAGNOSIS — K7469 Other cirrhosis of liver: Secondary | ICD-10-CM | POA: Diagnosis not present

## 2015-09-28 DIAGNOSIS — E1122 Type 2 diabetes mellitus with diabetic chronic kidney disease: Secondary | ICD-10-CM | POA: Diagnosis not present

## 2015-09-28 DIAGNOSIS — I5030 Unspecified diastolic (congestive) heart failure: Secondary | ICD-10-CM | POA: Diagnosis not present

## 2015-09-28 DIAGNOSIS — N184 Chronic kidney disease, stage 4 (severe): Secondary | ICD-10-CM | POA: Diagnosis not present

## 2015-09-28 DIAGNOSIS — E1151 Type 2 diabetes mellitus with diabetic peripheral angiopathy without gangrene: Secondary | ICD-10-CM | POA: Diagnosis not present

## 2015-10-04 DIAGNOSIS — I130 Hypertensive heart and chronic kidney disease with heart failure and stage 1 through stage 4 chronic kidney disease, or unspecified chronic kidney disease: Secondary | ICD-10-CM | POA: Diagnosis not present

## 2015-10-04 DIAGNOSIS — N184 Chronic kidney disease, stage 4 (severe): Secondary | ICD-10-CM | POA: Diagnosis not present

## 2015-10-04 DIAGNOSIS — I5030 Unspecified diastolic (congestive) heart failure: Secondary | ICD-10-CM | POA: Diagnosis not present

## 2015-10-04 DIAGNOSIS — E1151 Type 2 diabetes mellitus with diabetic peripheral angiopathy without gangrene: Secondary | ICD-10-CM | POA: Diagnosis not present

## 2015-10-04 DIAGNOSIS — E1122 Type 2 diabetes mellitus with diabetic chronic kidney disease: Secondary | ICD-10-CM | POA: Diagnosis not present

## 2015-10-04 DIAGNOSIS — K7469 Other cirrhosis of liver: Secondary | ICD-10-CM | POA: Diagnosis not present

## 2015-10-05 DIAGNOSIS — N184 Chronic kidney disease, stage 4 (severe): Secondary | ICD-10-CM | POA: Diagnosis not present

## 2015-10-05 DIAGNOSIS — I5030 Unspecified diastolic (congestive) heart failure: Secondary | ICD-10-CM | POA: Diagnosis not present

## 2015-10-05 DIAGNOSIS — E1122 Type 2 diabetes mellitus with diabetic chronic kidney disease: Secondary | ICD-10-CM | POA: Diagnosis not present

## 2015-10-05 DIAGNOSIS — K7469 Other cirrhosis of liver: Secondary | ICD-10-CM | POA: Diagnosis not present

## 2015-10-05 DIAGNOSIS — I130 Hypertensive heart and chronic kidney disease with heart failure and stage 1 through stage 4 chronic kidney disease, or unspecified chronic kidney disease: Secondary | ICD-10-CM | POA: Diagnosis not present

## 2015-10-05 DIAGNOSIS — E1151 Type 2 diabetes mellitus with diabetic peripheral angiopathy without gangrene: Secondary | ICD-10-CM | POA: Diagnosis not present

## 2015-10-09 DIAGNOSIS — E1122 Type 2 diabetes mellitus with diabetic chronic kidney disease: Secondary | ICD-10-CM | POA: Diagnosis not present

## 2015-10-09 DIAGNOSIS — I5030 Unspecified diastolic (congestive) heart failure: Secondary | ICD-10-CM | POA: Diagnosis not present

## 2015-10-09 DIAGNOSIS — E1151 Type 2 diabetes mellitus with diabetic peripheral angiopathy without gangrene: Secondary | ICD-10-CM | POA: Diagnosis not present

## 2015-10-09 DIAGNOSIS — I130 Hypertensive heart and chronic kidney disease with heart failure and stage 1 through stage 4 chronic kidney disease, or unspecified chronic kidney disease: Secondary | ICD-10-CM | POA: Diagnosis not present

## 2015-10-09 DIAGNOSIS — N184 Chronic kidney disease, stage 4 (severe): Secondary | ICD-10-CM | POA: Diagnosis not present

## 2015-10-09 DIAGNOSIS — K7469 Other cirrhosis of liver: Secondary | ICD-10-CM | POA: Diagnosis not present

## 2015-10-16 DIAGNOSIS — I130 Hypertensive heart and chronic kidney disease with heart failure and stage 1 through stage 4 chronic kidney disease, or unspecified chronic kidney disease: Secondary | ICD-10-CM | POA: Diagnosis not present

## 2015-10-16 DIAGNOSIS — K7469 Other cirrhosis of liver: Secondary | ICD-10-CM | POA: Diagnosis not present

## 2015-10-16 DIAGNOSIS — E1122 Type 2 diabetes mellitus with diabetic chronic kidney disease: Secondary | ICD-10-CM | POA: Diagnosis not present

## 2015-10-16 DIAGNOSIS — N184 Chronic kidney disease, stage 4 (severe): Secondary | ICD-10-CM | POA: Diagnosis not present

## 2015-10-16 DIAGNOSIS — E1151 Type 2 diabetes mellitus with diabetic peripheral angiopathy without gangrene: Secondary | ICD-10-CM | POA: Diagnosis not present

## 2015-10-16 DIAGNOSIS — I5030 Unspecified diastolic (congestive) heart failure: Secondary | ICD-10-CM | POA: Diagnosis not present

## 2015-10-19 DIAGNOSIS — E1151 Type 2 diabetes mellitus with diabetic peripheral angiopathy without gangrene: Secondary | ICD-10-CM | POA: Diagnosis not present

## 2015-10-19 DIAGNOSIS — K7469 Other cirrhosis of liver: Secondary | ICD-10-CM | POA: Diagnosis not present

## 2015-10-19 DIAGNOSIS — I5030 Unspecified diastolic (congestive) heart failure: Secondary | ICD-10-CM | POA: Diagnosis not present

## 2015-10-19 DIAGNOSIS — E1122 Type 2 diabetes mellitus with diabetic chronic kidney disease: Secondary | ICD-10-CM | POA: Diagnosis not present

## 2015-10-19 DIAGNOSIS — N184 Chronic kidney disease, stage 4 (severe): Secondary | ICD-10-CM | POA: Diagnosis not present

## 2015-10-19 DIAGNOSIS — I130 Hypertensive heart and chronic kidney disease with heart failure and stage 1 through stage 4 chronic kidney disease, or unspecified chronic kidney disease: Secondary | ICD-10-CM | POA: Diagnosis not present

## 2015-10-20 DIAGNOSIS — M25512 Pain in left shoulder: Secondary | ICD-10-CM | POA: Diagnosis not present

## 2015-10-20 DIAGNOSIS — G4733 Obstructive sleep apnea (adult) (pediatric): Secondary | ICD-10-CM | POA: Diagnosis not present

## 2015-10-20 DIAGNOSIS — E1122 Type 2 diabetes mellitus with diabetic chronic kidney disease: Secondary | ICD-10-CM | POA: Diagnosis not present

## 2015-10-20 DIAGNOSIS — D696 Thrombocytopenia, unspecified: Secondary | ICD-10-CM | POA: Diagnosis not present

## 2015-10-20 DIAGNOSIS — I5030 Unspecified diastolic (congestive) heart failure: Secondary | ICD-10-CM | POA: Diagnosis not present

## 2015-10-20 DIAGNOSIS — N400 Enlarged prostate without lower urinary tract symptoms: Secondary | ICD-10-CM | POA: Diagnosis not present

## 2015-10-20 DIAGNOSIS — F330 Major depressive disorder, recurrent, mild: Secondary | ICD-10-CM | POA: Diagnosis not present

## 2015-10-20 DIAGNOSIS — Z9981 Dependence on supplemental oxygen: Secondary | ICD-10-CM | POA: Diagnosis not present

## 2015-10-20 DIAGNOSIS — F411 Generalized anxiety disorder: Secondary | ICD-10-CM | POA: Diagnosis not present

## 2015-10-20 DIAGNOSIS — K7469 Other cirrhosis of liver: Secondary | ICD-10-CM | POA: Diagnosis not present

## 2015-10-20 DIAGNOSIS — N184 Chronic kidney disease, stage 4 (severe): Secondary | ICD-10-CM | POA: Diagnosis not present

## 2015-10-20 DIAGNOSIS — G8929 Other chronic pain: Secondary | ICD-10-CM | POA: Diagnosis not present

## 2015-10-20 DIAGNOSIS — Z794 Long term (current) use of insulin: Secondary | ICD-10-CM | POA: Diagnosis not present

## 2015-10-20 DIAGNOSIS — I130 Hypertensive heart and chronic kidney disease with heart failure and stage 1 through stage 4 chronic kidney disease, or unspecified chronic kidney disease: Secondary | ICD-10-CM | POA: Diagnosis not present

## 2015-10-20 DIAGNOSIS — D649 Anemia, unspecified: Secondary | ICD-10-CM | POA: Diagnosis not present

## 2015-10-20 DIAGNOSIS — E875 Hyperkalemia: Secondary | ICD-10-CM | POA: Diagnosis not present

## 2015-10-20 DIAGNOSIS — I509 Heart failure, unspecified: Secondary | ICD-10-CM | POA: Diagnosis not present

## 2015-10-20 DIAGNOSIS — E119 Type 2 diabetes mellitus without complications: Secondary | ICD-10-CM | POA: Diagnosis not present

## 2015-10-20 DIAGNOSIS — E291 Testicular hypofunction: Secondary | ICD-10-CM | POA: Diagnosis not present

## 2015-10-20 DIAGNOSIS — E1151 Type 2 diabetes mellitus with diabetic peripheral angiopathy without gangrene: Secondary | ICD-10-CM | POA: Diagnosis not present

## 2015-10-20 DIAGNOSIS — I10 Essential (primary) hypertension: Secondary | ICD-10-CM | POA: Diagnosis not present

## 2015-10-20 DIAGNOSIS — R531 Weakness: Secondary | ICD-10-CM | POA: Diagnosis not present

## 2015-10-24 DIAGNOSIS — N184 Chronic kidney disease, stage 4 (severe): Secondary | ICD-10-CM | POA: Diagnosis not present

## 2015-10-24 DIAGNOSIS — E1122 Type 2 diabetes mellitus with diabetic chronic kidney disease: Secondary | ICD-10-CM | POA: Diagnosis not present

## 2015-10-24 DIAGNOSIS — I130 Hypertensive heart and chronic kidney disease with heart failure and stage 1 through stage 4 chronic kidney disease, or unspecified chronic kidney disease: Secondary | ICD-10-CM | POA: Diagnosis not present

## 2015-10-24 DIAGNOSIS — R531 Weakness: Secondary | ICD-10-CM | POA: Diagnosis not present

## 2015-10-24 DIAGNOSIS — M25512 Pain in left shoulder: Secondary | ICD-10-CM | POA: Diagnosis not present

## 2015-10-24 DIAGNOSIS — I5030 Unspecified diastolic (congestive) heart failure: Secondary | ICD-10-CM | POA: Diagnosis not present

## 2015-10-26 DIAGNOSIS — I130 Hypertensive heart and chronic kidney disease with heart failure and stage 1 through stage 4 chronic kidney disease, or unspecified chronic kidney disease: Secondary | ICD-10-CM | POA: Diagnosis not present

## 2015-10-26 DIAGNOSIS — N184 Chronic kidney disease, stage 4 (severe): Secondary | ICD-10-CM | POA: Diagnosis not present

## 2015-10-26 DIAGNOSIS — I5030 Unspecified diastolic (congestive) heart failure: Secondary | ICD-10-CM | POA: Diagnosis not present

## 2015-10-26 DIAGNOSIS — M25512 Pain in left shoulder: Secondary | ICD-10-CM | POA: Diagnosis not present

## 2015-10-26 DIAGNOSIS — E1122 Type 2 diabetes mellitus with diabetic chronic kidney disease: Secondary | ICD-10-CM | POA: Diagnosis not present

## 2015-10-26 DIAGNOSIS — R531 Weakness: Secondary | ICD-10-CM | POA: Diagnosis not present

## 2015-11-01 ENCOUNTER — Other Ambulatory Visit: Payer: Self-pay | Admitting: Dermatology

## 2015-11-01 DIAGNOSIS — N184 Chronic kidney disease, stage 4 (severe): Secondary | ICD-10-CM | POA: Diagnosis not present

## 2015-11-01 DIAGNOSIS — I130 Hypertensive heart and chronic kidney disease with heart failure and stage 1 through stage 4 chronic kidney disease, or unspecified chronic kidney disease: Secondary | ICD-10-CM | POA: Diagnosis not present

## 2015-11-01 DIAGNOSIS — D239 Other benign neoplasm of skin, unspecified: Secondary | ICD-10-CM | POA: Diagnosis not present

## 2015-11-01 DIAGNOSIS — R531 Weakness: Secondary | ICD-10-CM | POA: Diagnosis not present

## 2015-11-01 DIAGNOSIS — D485 Neoplasm of uncertain behavior of skin: Secondary | ICD-10-CM | POA: Diagnosis not present

## 2015-11-01 DIAGNOSIS — M25512 Pain in left shoulder: Secondary | ICD-10-CM | POA: Diagnosis not present

## 2015-11-01 DIAGNOSIS — L821 Other seborrheic keratosis: Secondary | ICD-10-CM | POA: Diagnosis not present

## 2015-11-01 DIAGNOSIS — I5030 Unspecified diastolic (congestive) heart failure: Secondary | ICD-10-CM | POA: Diagnosis not present

## 2015-11-01 DIAGNOSIS — L820 Inflamed seborrheic keratosis: Secondary | ICD-10-CM | POA: Diagnosis not present

## 2015-11-01 DIAGNOSIS — E1122 Type 2 diabetes mellitus with diabetic chronic kidney disease: Secondary | ICD-10-CM | POA: Diagnosis not present

## 2015-11-02 DIAGNOSIS — N184 Chronic kidney disease, stage 4 (severe): Secondary | ICD-10-CM | POA: Diagnosis not present

## 2015-11-02 DIAGNOSIS — I130 Hypertensive heart and chronic kidney disease with heart failure and stage 1 through stage 4 chronic kidney disease, or unspecified chronic kidney disease: Secondary | ICD-10-CM | POA: Diagnosis not present

## 2015-11-02 DIAGNOSIS — I5030 Unspecified diastolic (congestive) heart failure: Secondary | ICD-10-CM | POA: Diagnosis not present

## 2015-11-02 DIAGNOSIS — E1122 Type 2 diabetes mellitus with diabetic chronic kidney disease: Secondary | ICD-10-CM | POA: Diagnosis not present

## 2015-11-02 DIAGNOSIS — R531 Weakness: Secondary | ICD-10-CM | POA: Diagnosis not present

## 2015-11-02 DIAGNOSIS — M25512 Pain in left shoulder: Secondary | ICD-10-CM | POA: Diagnosis not present

## 2015-11-05 DIAGNOSIS — R531 Weakness: Secondary | ICD-10-CM | POA: Diagnosis not present

## 2015-11-05 DIAGNOSIS — M25512 Pain in left shoulder: Secondary | ICD-10-CM | POA: Diagnosis not present

## 2015-11-05 DIAGNOSIS — N184 Chronic kidney disease, stage 4 (severe): Secondary | ICD-10-CM | POA: Diagnosis not present

## 2015-11-05 DIAGNOSIS — I130 Hypertensive heart and chronic kidney disease with heart failure and stage 1 through stage 4 chronic kidney disease, or unspecified chronic kidney disease: Secondary | ICD-10-CM | POA: Diagnosis not present

## 2015-11-05 DIAGNOSIS — E1122 Type 2 diabetes mellitus with diabetic chronic kidney disease: Secondary | ICD-10-CM | POA: Diagnosis not present

## 2015-11-05 DIAGNOSIS — I5030 Unspecified diastolic (congestive) heart failure: Secondary | ICD-10-CM | POA: Diagnosis not present

## 2015-11-08 DIAGNOSIS — N184 Chronic kidney disease, stage 4 (severe): Secondary | ICD-10-CM | POA: Diagnosis not present

## 2015-11-08 DIAGNOSIS — M25512 Pain in left shoulder: Secondary | ICD-10-CM | POA: Diagnosis not present

## 2015-11-08 DIAGNOSIS — I130 Hypertensive heart and chronic kidney disease with heart failure and stage 1 through stage 4 chronic kidney disease, or unspecified chronic kidney disease: Secondary | ICD-10-CM | POA: Diagnosis not present

## 2015-11-08 DIAGNOSIS — I5030 Unspecified diastolic (congestive) heart failure: Secondary | ICD-10-CM | POA: Diagnosis not present

## 2015-11-08 DIAGNOSIS — R531 Weakness: Secondary | ICD-10-CM | POA: Diagnosis not present

## 2015-11-08 DIAGNOSIS — E1122 Type 2 diabetes mellitus with diabetic chronic kidney disease: Secondary | ICD-10-CM | POA: Diagnosis not present

## 2015-11-12 DIAGNOSIS — N184 Chronic kidney disease, stage 4 (severe): Secondary | ICD-10-CM | POA: Diagnosis not present

## 2015-11-12 DIAGNOSIS — R531 Weakness: Secondary | ICD-10-CM | POA: Diagnosis not present

## 2015-11-12 DIAGNOSIS — I5030 Unspecified diastolic (congestive) heart failure: Secondary | ICD-10-CM | POA: Diagnosis not present

## 2015-11-12 DIAGNOSIS — E1122 Type 2 diabetes mellitus with diabetic chronic kidney disease: Secondary | ICD-10-CM | POA: Diagnosis not present

## 2015-11-12 DIAGNOSIS — I130 Hypertensive heart and chronic kidney disease with heart failure and stage 1 through stage 4 chronic kidney disease, or unspecified chronic kidney disease: Secondary | ICD-10-CM | POA: Diagnosis not present

## 2015-11-12 DIAGNOSIS — M25512 Pain in left shoulder: Secondary | ICD-10-CM | POA: Diagnosis not present

## 2015-11-13 ENCOUNTER — Telehealth: Payer: Self-pay

## 2015-11-13 DIAGNOSIS — M25512 Pain in left shoulder: Secondary | ICD-10-CM | POA: Diagnosis not present

## 2015-11-13 DIAGNOSIS — I130 Hypertensive heart and chronic kidney disease with heart failure and stage 1 through stage 4 chronic kidney disease, or unspecified chronic kidney disease: Secondary | ICD-10-CM | POA: Diagnosis not present

## 2015-11-13 DIAGNOSIS — I5030 Unspecified diastolic (congestive) heart failure: Secondary | ICD-10-CM | POA: Diagnosis not present

## 2015-11-13 DIAGNOSIS — E1122 Type 2 diabetes mellitus with diabetic chronic kidney disease: Secondary | ICD-10-CM | POA: Diagnosis not present

## 2015-11-13 DIAGNOSIS — N184 Chronic kidney disease, stage 4 (severe): Secondary | ICD-10-CM | POA: Diagnosis not present

## 2015-11-13 DIAGNOSIS — R531 Weakness: Secondary | ICD-10-CM | POA: Diagnosis not present

## 2015-11-13 NOTE — Telephone Encounter (Signed)
I cannot do that with our current guideline.

## 2015-11-13 NOTE — Telephone Encounter (Signed)
This patient is requesting a new script for cpap.  He is past patient of hers and is using [facility] .   He says that he is bedridden and it will be very difficult to come in.  The best number to reach him is [phone]

## 2015-11-13 NOTE — Telephone Encounter (Signed)
Dr. Brett Fairy has not seen this pt in over 3 years (there are no office visit notes available in EPIC).  Do you want to provide a script for a new cpap without requiring an office visit? (Pt would be a new patient since he has not been seen in greater than three years.)

## 2015-11-13 NOTE — Telephone Encounter (Signed)
Pt does not want to come to the office at all for an appt. He just wants the order for a new cpap. He has not been seen in over three years.

## 2015-11-13 NOTE — Telephone Encounter (Signed)
i remember Mr Lavallee and family, yes make a new patient visit/ consult and have him bring his machine. CD

## 2015-11-13 NOTE — Telephone Encounter (Signed)
I spoke to pt and advised him that Dr. Brett Fairy cannot write him a new order for a cpap without having been seen in the office. Pt has not been here in over 3 years and needs a referral to make an appt. Pt verbalized understanding and says that he will call his PCP to get a referral.

## 2015-11-19 DIAGNOSIS — N184 Chronic kidney disease, stage 4 (severe): Secondary | ICD-10-CM | POA: Diagnosis not present

## 2015-11-19 DIAGNOSIS — E1122 Type 2 diabetes mellitus with diabetic chronic kidney disease: Secondary | ICD-10-CM | POA: Diagnosis not present

## 2015-11-19 DIAGNOSIS — I5030 Unspecified diastolic (congestive) heart failure: Secondary | ICD-10-CM | POA: Diagnosis not present

## 2015-11-19 DIAGNOSIS — R531 Weakness: Secondary | ICD-10-CM | POA: Diagnosis not present

## 2015-11-19 DIAGNOSIS — I130 Hypertensive heart and chronic kidney disease with heart failure and stage 1 through stage 4 chronic kidney disease, or unspecified chronic kidney disease: Secondary | ICD-10-CM | POA: Diagnosis not present

## 2015-11-19 DIAGNOSIS — M25512 Pain in left shoulder: Secondary | ICD-10-CM | POA: Diagnosis not present

## 2015-11-23 DIAGNOSIS — N184 Chronic kidney disease, stage 4 (severe): Secondary | ICD-10-CM | POA: Diagnosis not present

## 2015-11-23 DIAGNOSIS — E1122 Type 2 diabetes mellitus with diabetic chronic kidney disease: Secondary | ICD-10-CM | POA: Diagnosis not present

## 2015-11-23 DIAGNOSIS — R531 Weakness: Secondary | ICD-10-CM | POA: Diagnosis not present

## 2015-11-23 DIAGNOSIS — M25512 Pain in left shoulder: Secondary | ICD-10-CM | POA: Diagnosis not present

## 2015-11-23 DIAGNOSIS — I130 Hypertensive heart and chronic kidney disease with heart failure and stage 1 through stage 4 chronic kidney disease, or unspecified chronic kidney disease: Secondary | ICD-10-CM | POA: Diagnosis not present

## 2015-11-23 DIAGNOSIS — I5030 Unspecified diastolic (congestive) heart failure: Secondary | ICD-10-CM | POA: Diagnosis not present

## 2015-12-03 DIAGNOSIS — E1122 Type 2 diabetes mellitus with diabetic chronic kidney disease: Secondary | ICD-10-CM | POA: Diagnosis not present

## 2015-12-03 DIAGNOSIS — I130 Hypertensive heart and chronic kidney disease with heart failure and stage 1 through stage 4 chronic kidney disease, or unspecified chronic kidney disease: Secondary | ICD-10-CM | POA: Diagnosis not present

## 2015-12-03 DIAGNOSIS — N184 Chronic kidney disease, stage 4 (severe): Secondary | ICD-10-CM | POA: Diagnosis not present

## 2015-12-03 DIAGNOSIS — M25512 Pain in left shoulder: Secondary | ICD-10-CM | POA: Diagnosis not present

## 2015-12-03 DIAGNOSIS — I5030 Unspecified diastolic (congestive) heart failure: Secondary | ICD-10-CM | POA: Diagnosis not present

## 2015-12-03 DIAGNOSIS — R531 Weakness: Secondary | ICD-10-CM | POA: Diagnosis not present

## 2015-12-07 DIAGNOSIS — I130 Hypertensive heart and chronic kidney disease with heart failure and stage 1 through stage 4 chronic kidney disease, or unspecified chronic kidney disease: Secondary | ICD-10-CM | POA: Diagnosis not present

## 2015-12-07 DIAGNOSIS — R531 Weakness: Secondary | ICD-10-CM | POA: Diagnosis not present

## 2015-12-07 DIAGNOSIS — I5030 Unspecified diastolic (congestive) heart failure: Secondary | ICD-10-CM | POA: Diagnosis not present

## 2015-12-07 DIAGNOSIS — M25512 Pain in left shoulder: Secondary | ICD-10-CM | POA: Diagnosis not present

## 2015-12-07 DIAGNOSIS — E1122 Type 2 diabetes mellitus with diabetic chronic kidney disease: Secondary | ICD-10-CM | POA: Diagnosis not present

## 2015-12-07 DIAGNOSIS — N184 Chronic kidney disease, stage 4 (severe): Secondary | ICD-10-CM | POA: Diagnosis not present

## 2015-12-10 DIAGNOSIS — I5030 Unspecified diastolic (congestive) heart failure: Secondary | ICD-10-CM | POA: Diagnosis not present

## 2015-12-10 DIAGNOSIS — M25512 Pain in left shoulder: Secondary | ICD-10-CM | POA: Diagnosis not present

## 2015-12-10 DIAGNOSIS — I130 Hypertensive heart and chronic kidney disease with heart failure and stage 1 through stage 4 chronic kidney disease, or unspecified chronic kidney disease: Secondary | ICD-10-CM | POA: Diagnosis not present

## 2015-12-10 DIAGNOSIS — N184 Chronic kidney disease, stage 4 (severe): Secondary | ICD-10-CM | POA: Diagnosis not present

## 2015-12-10 DIAGNOSIS — E1122 Type 2 diabetes mellitus with diabetic chronic kidney disease: Secondary | ICD-10-CM | POA: Diagnosis not present

## 2015-12-10 DIAGNOSIS — R531 Weakness: Secondary | ICD-10-CM | POA: Diagnosis not present

## 2015-12-11 DIAGNOSIS — N184 Chronic kidney disease, stage 4 (severe): Secondary | ICD-10-CM | POA: Diagnosis not present

## 2015-12-11 DIAGNOSIS — I130 Hypertensive heart and chronic kidney disease with heart failure and stage 1 through stage 4 chronic kidney disease, or unspecified chronic kidney disease: Secondary | ICD-10-CM | POA: Diagnosis not present

## 2015-12-11 DIAGNOSIS — I5030 Unspecified diastolic (congestive) heart failure: Secondary | ICD-10-CM | POA: Diagnosis not present

## 2015-12-11 DIAGNOSIS — M25512 Pain in left shoulder: Secondary | ICD-10-CM | POA: Diagnosis not present

## 2015-12-11 DIAGNOSIS — E1122 Type 2 diabetes mellitus with diabetic chronic kidney disease: Secondary | ICD-10-CM | POA: Diagnosis not present

## 2015-12-11 DIAGNOSIS — R531 Weakness: Secondary | ICD-10-CM | POA: Diagnosis not present

## 2016-03-04 IMAGING — CR DG CHEST 1V PORT
2 series · 2 of 2 positions shown · non-contrast
Comparison: Single view of the chest 04/13/2015, 03/23/2015
08/16/2014.

CLINICAL DATA: Shortness of breath today.

EXAM:
PORTABLE CHEST 1 VIEW

[AP (1 of 2)]
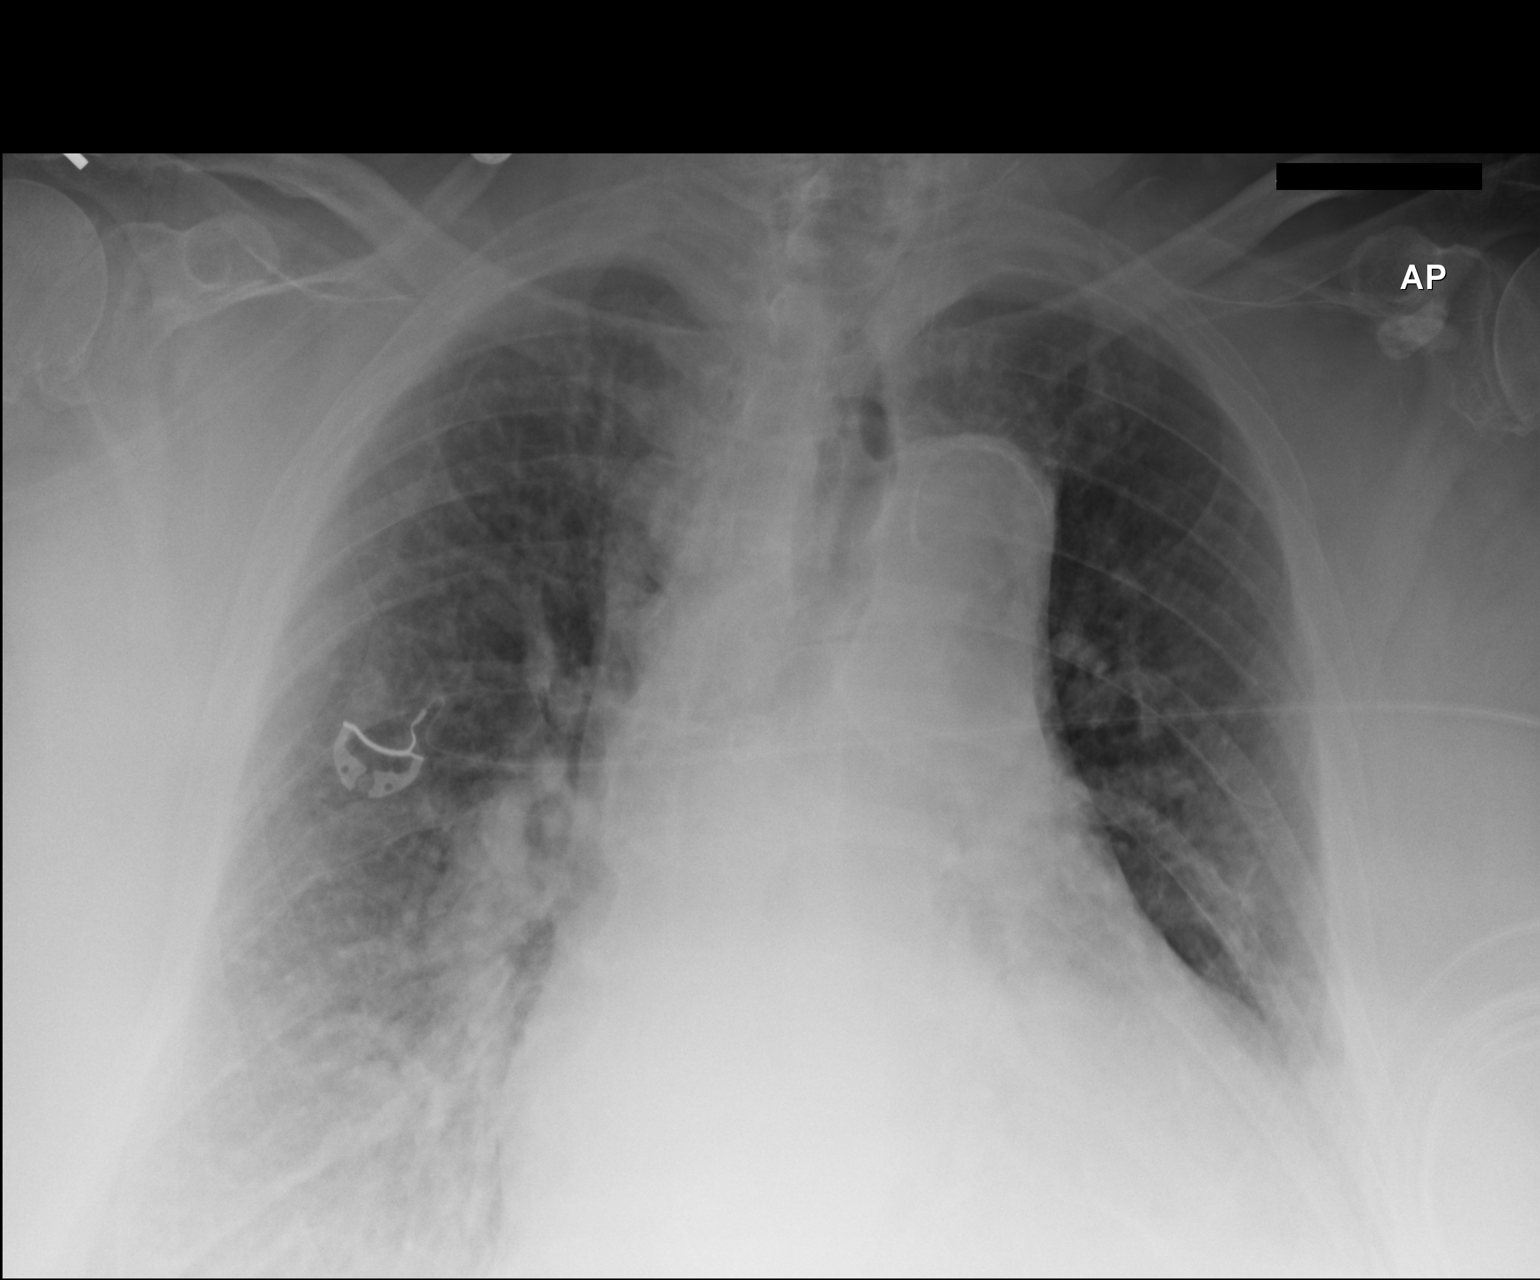

[AP (2 of 2)]
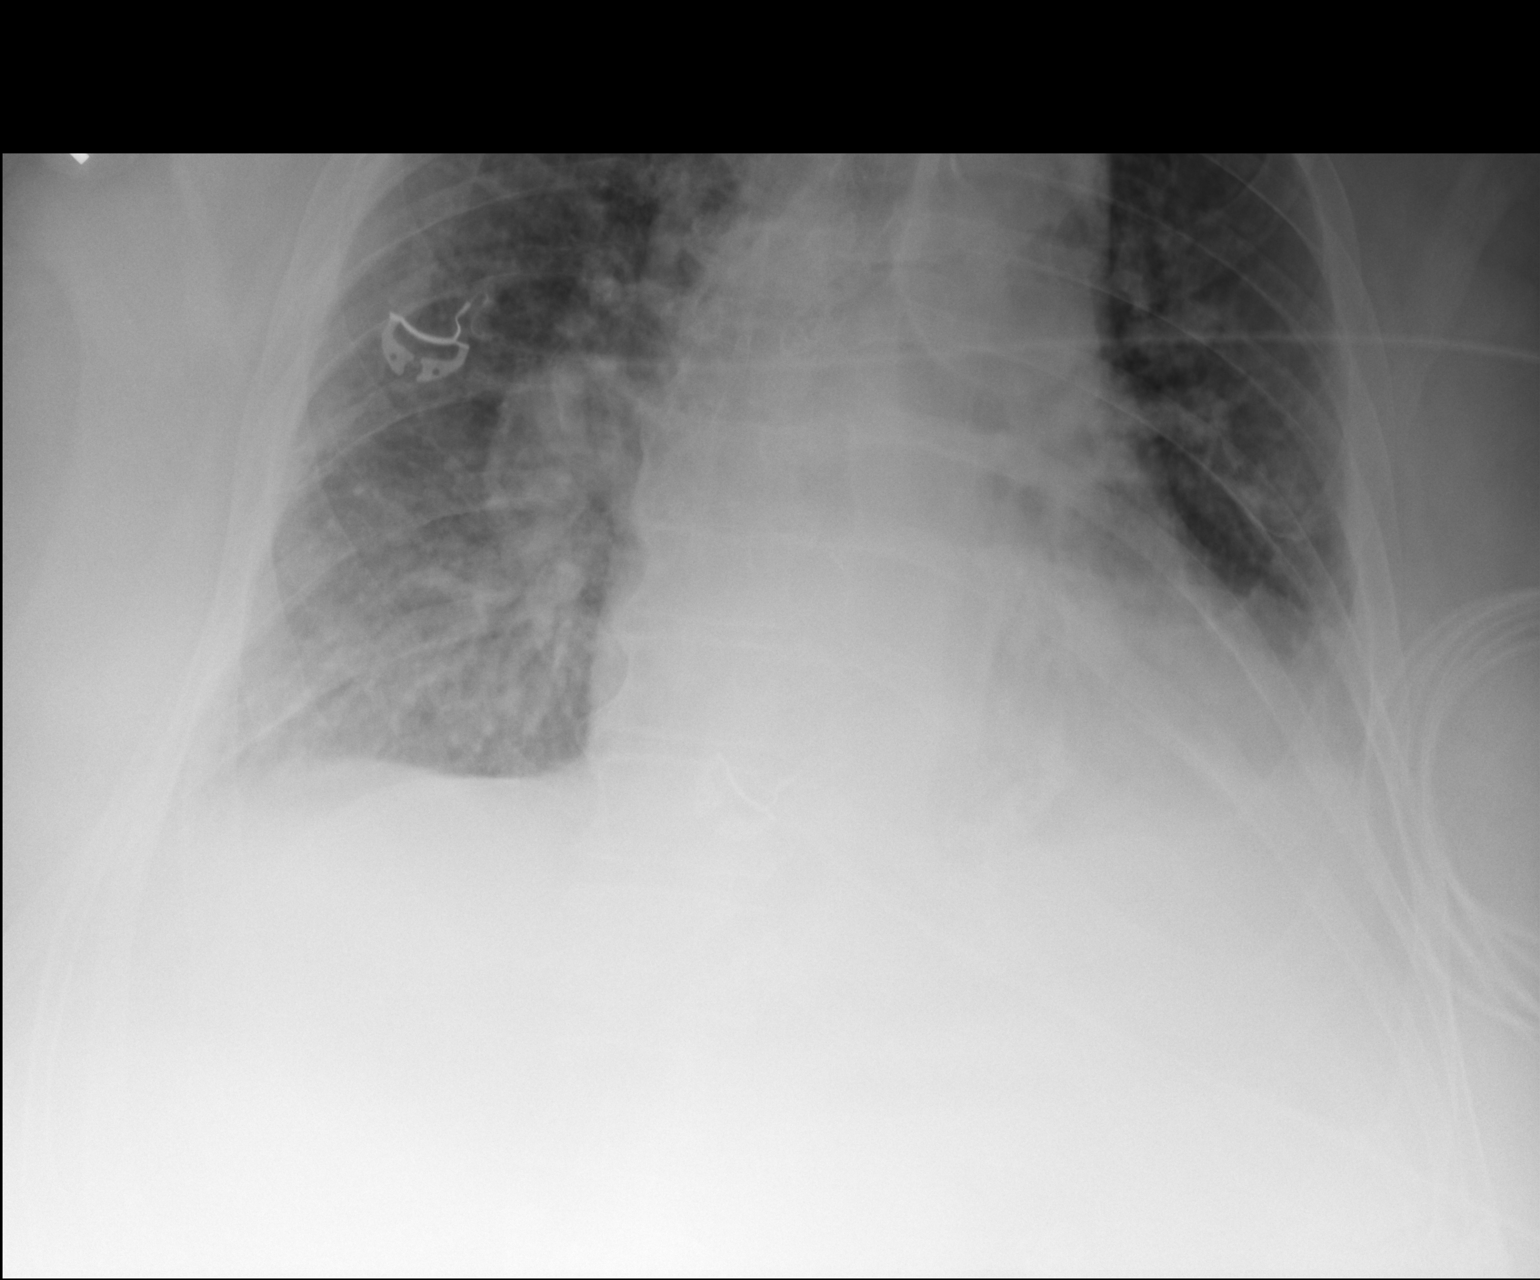

[2 of 2 positions shown; findings below may reference images not displayed]

FINDINGS: There is cardiomegaly and vascular congestion without frank edema.
Left basilar atelectasis is noted. No pneumothorax. Aortic
atherosclerosis is seen.
IMPRESSION: Cardiomegaly and pulmonary vascular congestion.

## 2016-03-08 DIAGNOSIS — Z0000 Encounter for general adult medical examination without abnormal findings: Secondary | ICD-10-CM | POA: Diagnosis not present

## 2016-03-08 DIAGNOSIS — Z23 Encounter for immunization: Secondary | ICD-10-CM | POA: Diagnosis not present

## 2016-05-29 DEATH — deceased

## 2023-04-27 ENCOUNTER — Encounter (HOSPITAL_COMMUNITY): Payer: Self-pay | Admitting: *Deleted
